# Patient Record
Sex: Female | Born: 1960 | Race: White | Hispanic: No | Marital: Single | Smoking: Current every day smoker
Health system: Southern US, Community
[De-identification: ages and names within clinical notes are randomized; demographics above are authoritative.]

## PROBLEM LIST (undated history)

## (undated) DIAGNOSIS — C4490 Unspecified malignant neoplasm of skin, unspecified: Secondary | ICD-10-CM

## (undated) DIAGNOSIS — N979 Female infertility, unspecified: Secondary | ICD-10-CM

## (undated) DIAGNOSIS — F419 Anxiety disorder, unspecified: Secondary | ICD-10-CM

## (undated) DIAGNOSIS — E282 Polycystic ovarian syndrome: Secondary | ICD-10-CM

## (undated) HISTORY — DX: Anxiety disorder, unspecified: F41.9

## (undated) HISTORY — DX: Female infertility, unspecified: N97.9

## (undated) HISTORY — DX: Polycystic ovarian syndrome: E28.2

## (undated) HISTORY — DX: Unspecified malignant neoplasm of skin, unspecified: C44.90

## (undated) HISTORY — PX: BREAST BIOPSY: SHX20

---

## 2004-02-06 ENCOUNTER — Other Ambulatory Visit: Admission: RE | Admit: 2004-02-06 | Discharge: 2004-02-06 | Payer: Self-pay | Admitting: Obstetrics and Gynecology

## 2004-02-07 ENCOUNTER — Other Ambulatory Visit: Admission: RE | Admit: 2004-02-07 | Discharge: 2004-02-07 | Payer: Self-pay | Admitting: Obstetrics and Gynecology

## 2004-09-13 ENCOUNTER — Inpatient Hospital Stay (HOSPITAL_COMMUNITY): Admission: AD | Admit: 2004-09-13 | Discharge: 2004-09-13 | Payer: Self-pay | Admitting: Obstetrics and Gynecology

## 2004-09-14 ENCOUNTER — Inpatient Hospital Stay (HOSPITAL_COMMUNITY): Admission: AD | Admit: 2004-09-14 | Discharge: 2004-09-16 | Payer: Self-pay | Admitting: Obstetrics and Gynecology

## 2004-09-18 ENCOUNTER — Encounter: Admission: RE | Admit: 2004-09-18 | Discharge: 2004-10-15 | Payer: Self-pay | Admitting: Obstetrics and Gynecology

## 2005-01-26 ENCOUNTER — Encounter: Admission: RE | Admit: 2005-01-26 | Discharge: 2005-01-26 | Payer: Self-pay | Admitting: Obstetrics and Gynecology

## 2006-01-27 ENCOUNTER — Encounter: Admission: RE | Admit: 2006-01-27 | Discharge: 2006-01-27 | Payer: Self-pay | Admitting: Obstetrics and Gynecology

## 2007-01-30 ENCOUNTER — Encounter: Admission: RE | Admit: 2007-01-30 | Discharge: 2007-01-30 | Payer: Self-pay | Admitting: Obstetrics and Gynecology

## 2008-02-19 ENCOUNTER — Encounter: Admission: RE | Admit: 2008-02-19 | Discharge: 2008-02-19 | Payer: Self-pay | Admitting: Obstetrics and Gynecology

## 2009-02-25 ENCOUNTER — Encounter: Admission: RE | Admit: 2009-02-25 | Discharge: 2009-02-25 | Payer: Self-pay | Admitting: Obstetrics and Gynecology

## 2010-02-20 ENCOUNTER — Encounter: Admission: RE | Admit: 2010-02-20 | Discharge: 2010-02-20 | Payer: Self-pay | Admitting: Obstetrics and Gynecology

## 2014-11-15 ENCOUNTER — Other Ambulatory Visit: Payer: Self-pay | Admitting: Physician Assistant

## 2016-04-06 ENCOUNTER — Other Ambulatory Visit: Payer: Self-pay | Admitting: Obstetrics and Gynecology

## 2016-04-06 DIAGNOSIS — R928 Other abnormal and inconclusive findings on diagnostic imaging of breast: Secondary | ICD-10-CM

## 2016-04-09 ENCOUNTER — Ambulatory Visit
Admission: RE | Admit: 2016-04-09 | Discharge: 2016-04-09 | Disposition: A | Discharge status: Home / Self Care | Payer: BLUE CROSS/BLUE SHIELD | Source: Ambulatory Visit | Attending: Obstetrics and Gynecology | Admitting: Obstetrics and Gynecology

## 2016-04-09 DIAGNOSIS — R928 Other abnormal and inconclusive findings on diagnostic imaging of breast: Secondary | ICD-10-CM

## 2018-02-07 DIAGNOSIS — C4491 Basal cell carcinoma of skin, unspecified: Secondary | ICD-10-CM | POA: Insufficient documentation

## 2018-07-01 IMAGING — MG 2D DIGITAL DIAGNOSTIC UNILATERAL RIGHT MAMMOGRAM WITH CAD AND AD
6 series · 6 of 14 positions shown · non-contrast
Comparison: Previous exams including recent screening mammogram
dated 04/05/2016.

CLINICAL DATA: Patient returns today to evaluate a possible right
breast mass identified on recent screening mammogram.

EXAM:
2D DIGITAL DIAGNOSTIC RIGHT MAMMOGRAM WITH CAD AND ADJUNCT TOMO
ULTRASOUND RIGHT BREAST

[R MLO]
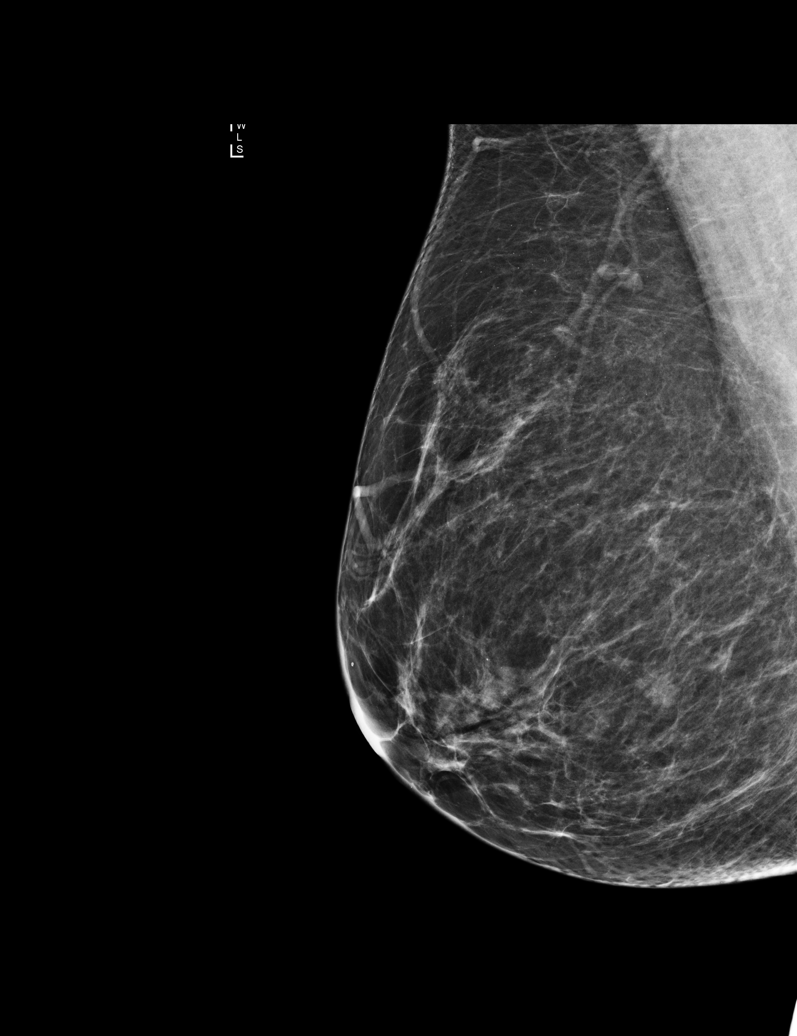

[R MLO synth-2D]
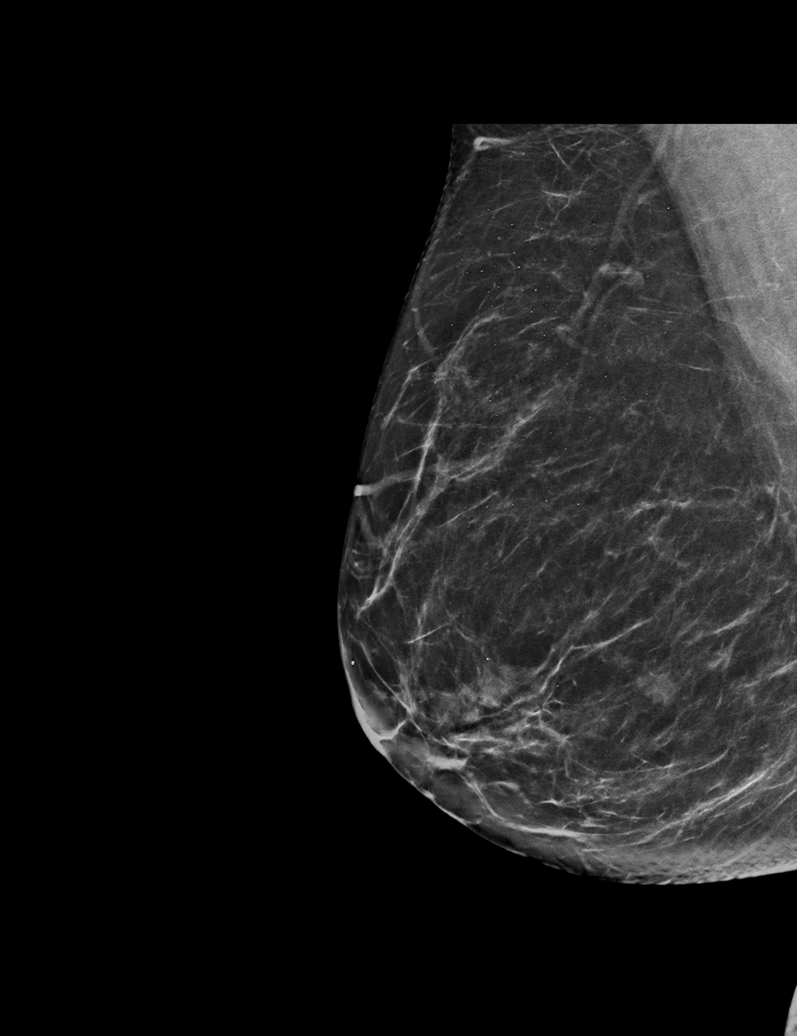

[R CC synth-2D]
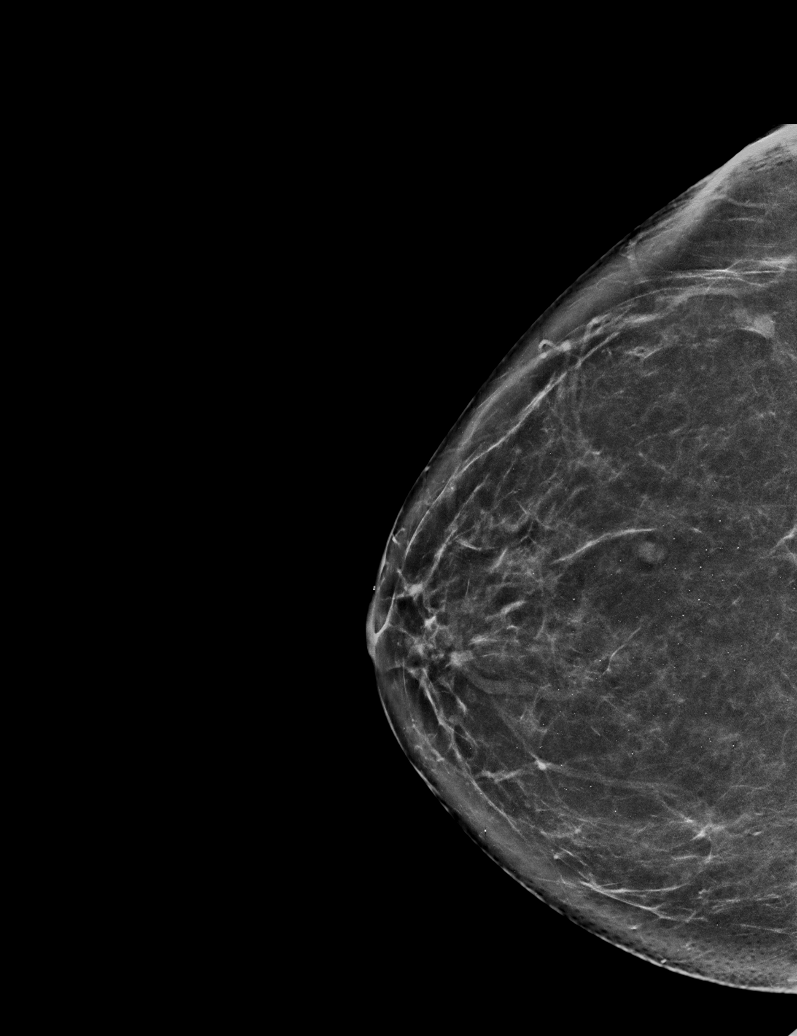

[R CC]
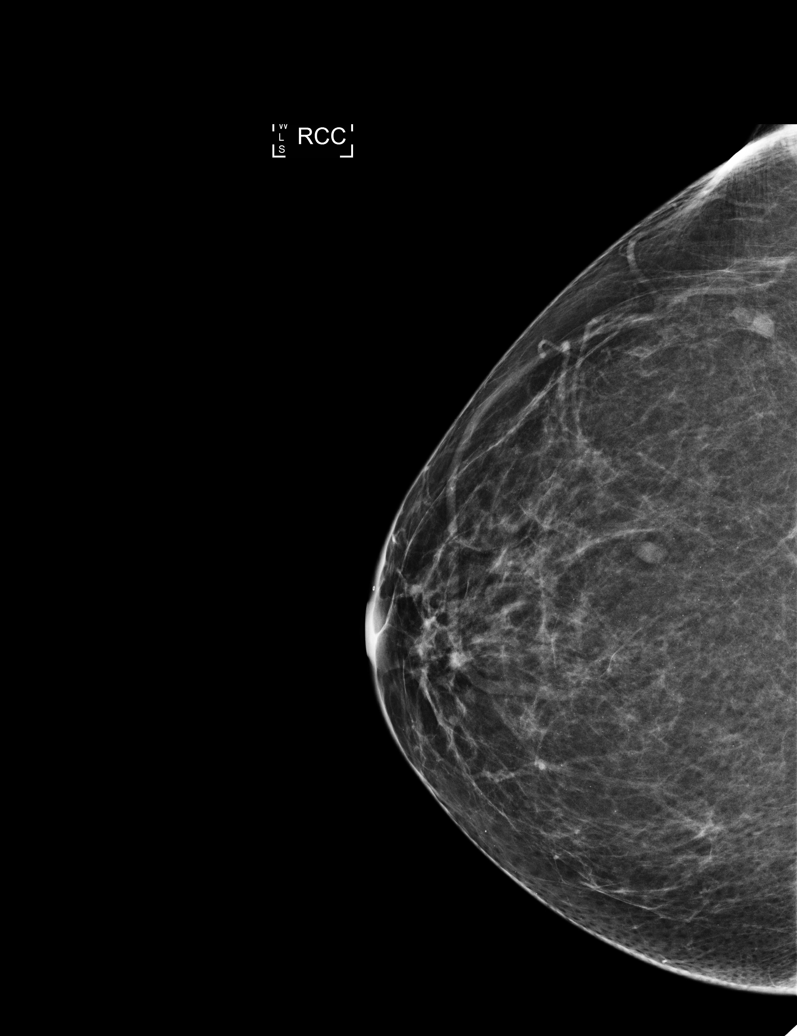

[R MLO tomo · tomo slice 37/72.0]
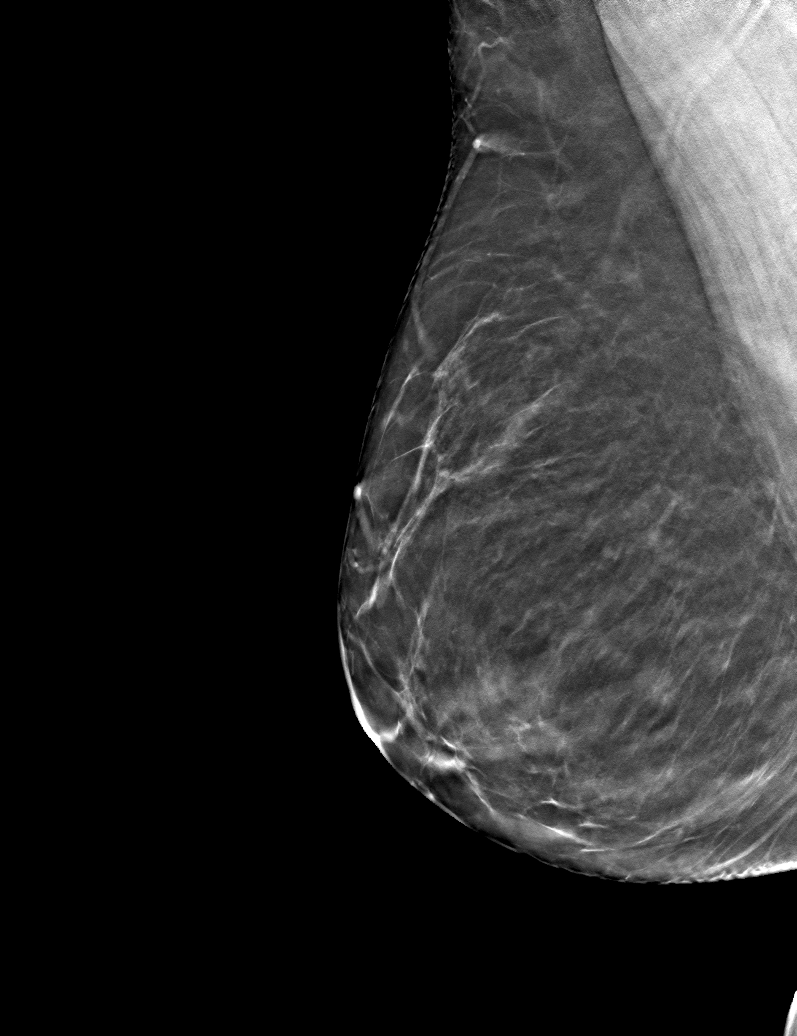

[R CC tomo · tomo slice 37/74.0]
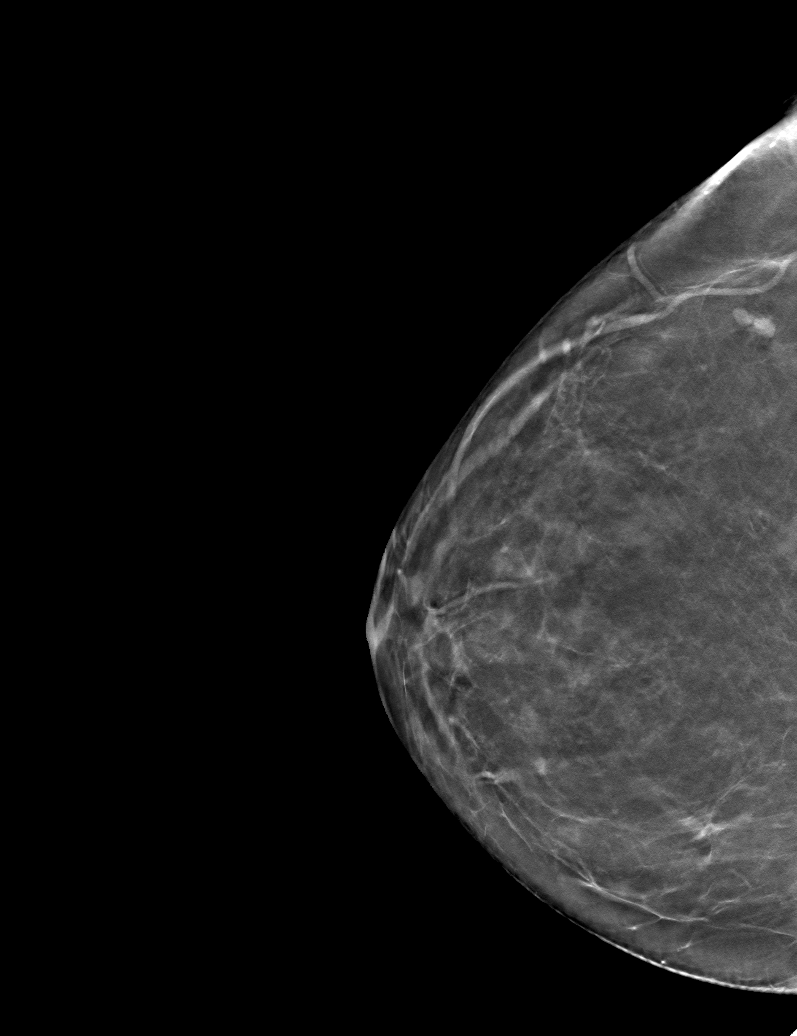

[6 of 14 positions shown; findings below may reference images not displayed]

ACR Breast Density Category b: There are scattered areas of
fibroglandular density.
FINDINGS: On today's additional views with 3D tomosynthesis, an oval
circumscribed mass is confirmed within the lower outer quadrant of
the right breast, measuring approximately 7 mm greatest dimension

Mammographic images were processed with CAD.

Targeted ultrasound is performed, showing a benign cyst within the
right breast at the 7 o'clock axis, 2 cm from the nipple, avascular,
corresponding to the mammographic finding.
IMPRESSION: No evidence of malignancy.  Benign cyst within the right breast.

Patient may return to routine annual bilateral screening mammogram
schedule.

RECOMMENDATION:
Screening mammogram in one year.(Code:[id number])

I have discussed the findings and recommendations with the patient.
Results were also provided in writing at the conclusion of the
visit. If applicable, a reminder letter will be sent to the patient
regarding the next appointment.

BI-RADS CATEGORY  2: Benign.

## 2019-05-11 HISTORY — PX: APPENDECTOMY: SHX54

## 2019-07-10 DIAGNOSIS — Z720 Tobacco use: Secondary | ICD-10-CM | POA: Insufficient documentation

## 2019-07-10 DIAGNOSIS — F17200 Nicotine dependence, unspecified, uncomplicated: Secondary | ICD-10-CM | POA: Insufficient documentation

## 2019-07-13 ENCOUNTER — Other Ambulatory Visit: Payer: Self-pay | Admitting: Obstetrics and Gynecology

## 2019-07-13 DIAGNOSIS — R928 Other abnormal and inconclusive findings on diagnostic imaging of breast: Secondary | ICD-10-CM

## 2019-07-26 ENCOUNTER — Ambulatory Visit
Admission: RE | Admit: 2019-07-26 | Discharge: 2019-07-26 | Disposition: A | Discharge status: Home / Self Care | Payer: Managed Care, Other (non HMO) | Source: Ambulatory Visit | Attending: Obstetrics and Gynecology | Admitting: Obstetrics and Gynecology

## 2019-07-26 ENCOUNTER — Other Ambulatory Visit: Payer: Self-pay | Admitting: Obstetrics and Gynecology

## 2019-07-26 ENCOUNTER — Other Ambulatory Visit: Payer: Self-pay

## 2019-07-26 DIAGNOSIS — R928 Other abnormal and inconclusive findings on diagnostic imaging of breast: Secondary | ICD-10-CM

## 2019-12-11 DIAGNOSIS — K3532 Acute appendicitis with perforation and localized peritonitis, without abscess: Secondary | ICD-10-CM | POA: Insufficient documentation

## 2019-12-11 HISTORY — DX: Acute appendicitis with perforation, localized peritonitis, and gangrene, without abscess: K35.32

## 2020-01-28 ENCOUNTER — Ambulatory Visit
Admission: RE | Admit: 2020-01-28 | Discharge: 2020-01-28 | Disposition: A | Discharge status: Home / Self Care | Payer: No Typology Code available for payment source | Source: Ambulatory Visit | Attending: Obstetrics and Gynecology | Admitting: Obstetrics and Gynecology

## 2020-01-28 ENCOUNTER — Other Ambulatory Visit: Payer: Self-pay

## 2020-01-28 DIAGNOSIS — R928 Other abnormal and inconclusive findings on diagnostic imaging of breast: Secondary | ICD-10-CM

## 2021-08-11 LAB — HM PAP SMEAR

## 2021-08-11 LAB — RESULTS CONSOLE HPV: CHL HPV: NEGATIVE

## 2022-04-20 IMAGING — US US BREAST*R* LIMITED INC AXILLA
1 series · 4 of 4 positions shown · non-contrast
Comparison: Previous exam(s).

CLINICAL DATA: 59-year-old female for six-month follow-up of RIGHT
breast mass.

EXAM:
ULTRASOUND OF THE RIGHT BREAST

[Series 1: us breast*right* limited inc axilla · 0.05mm/px · 4 of 4 slices shown]
[im 1/4]
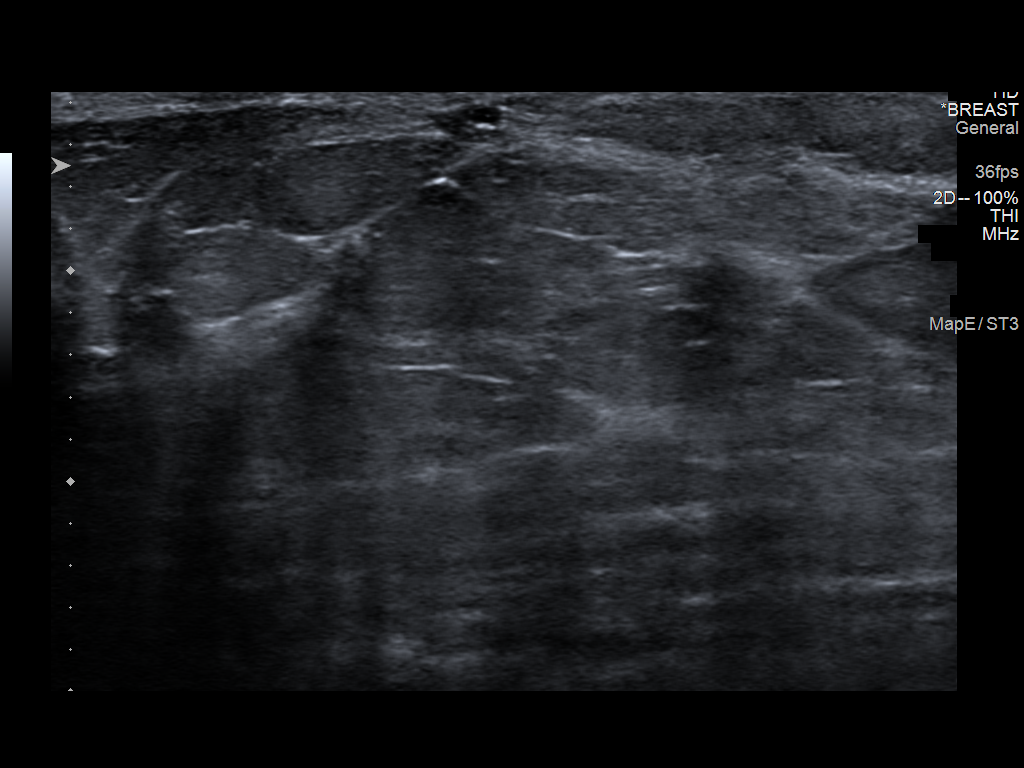
[im 2/4]
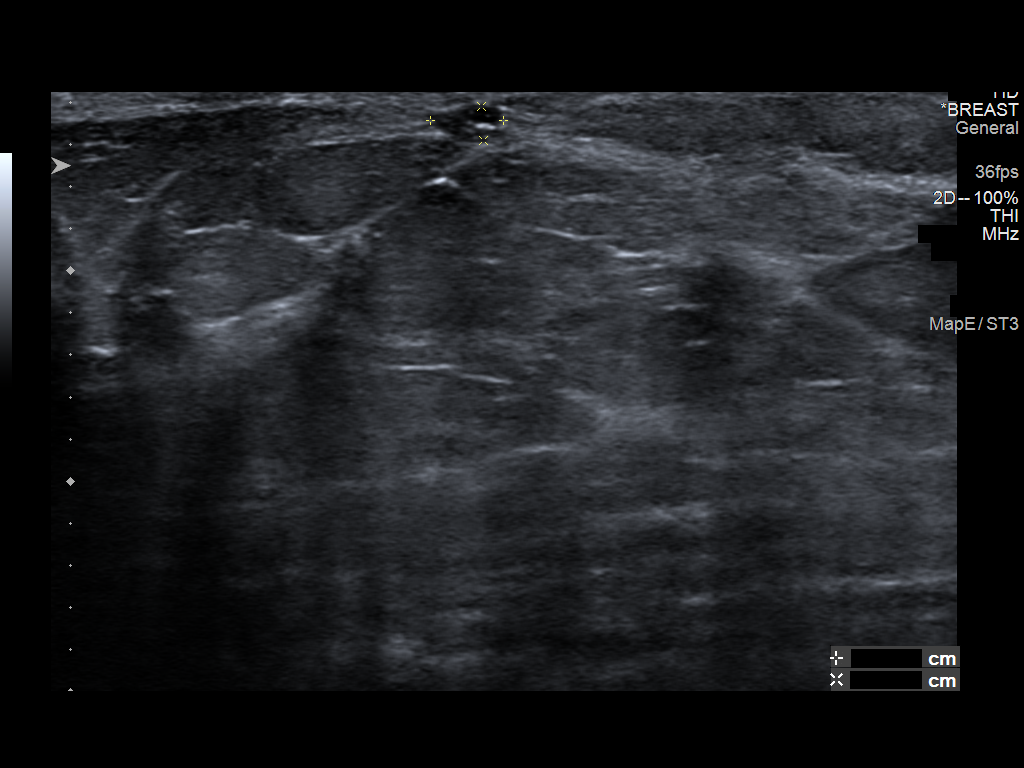
[im 3/4]
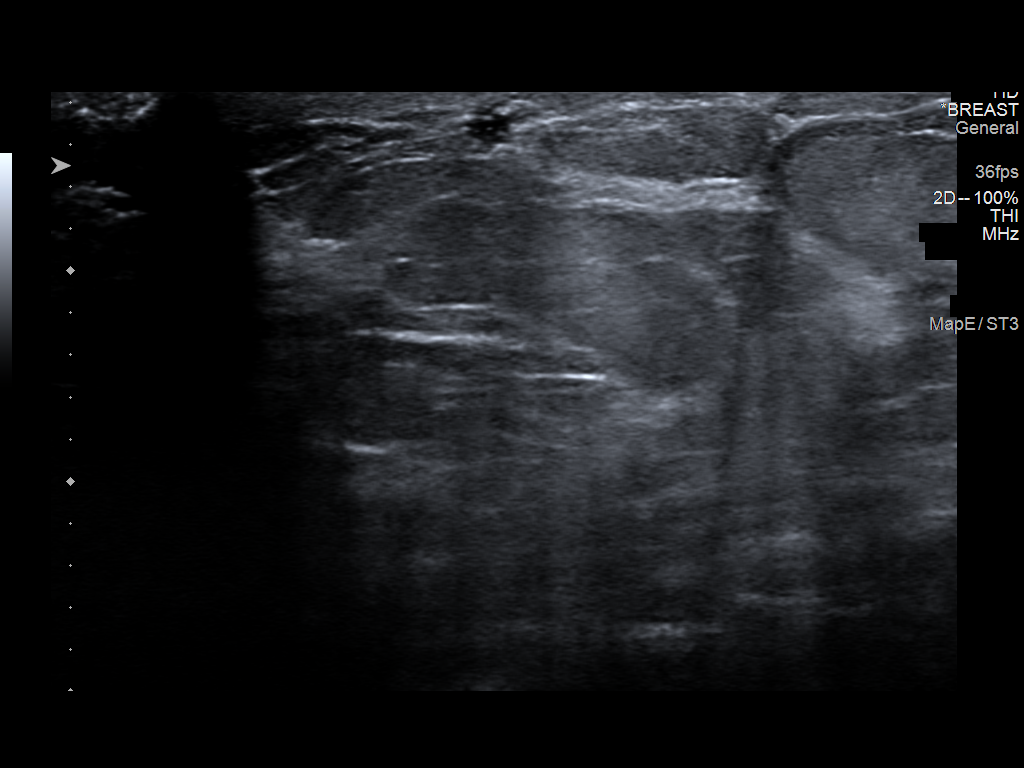
[im 4/4]
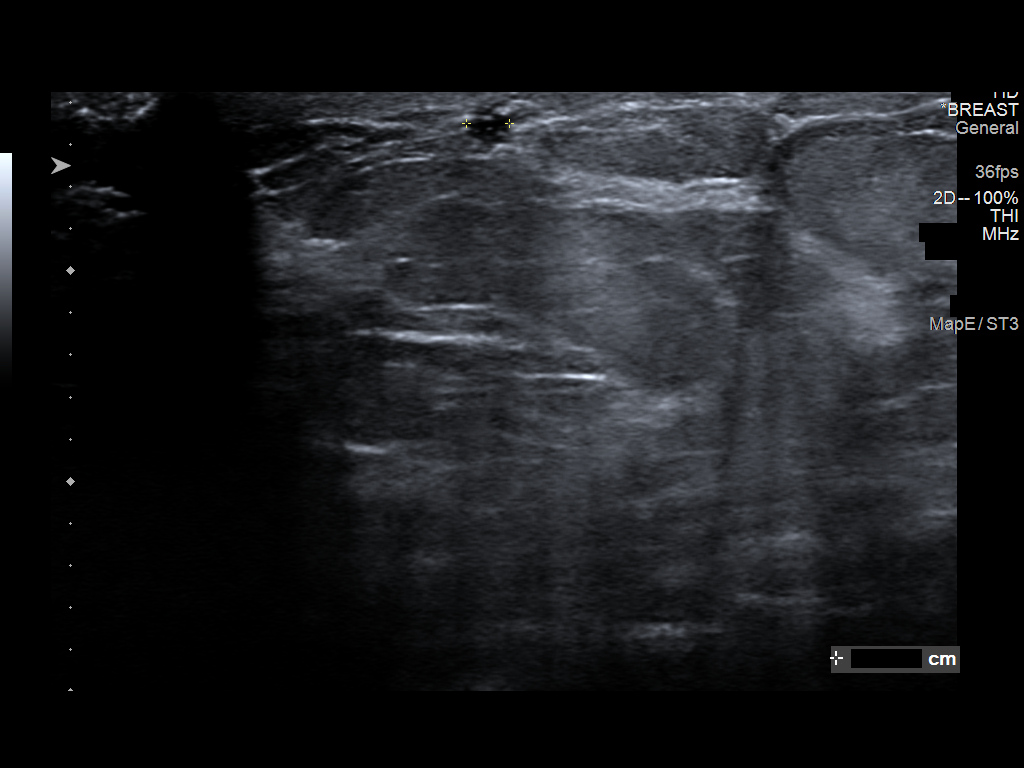

[4 of 4 positions shown; findings below may reference images not displayed]

FINDINGS: Targeted ultrasound is performed, showing a 0.4 x 0.2 x 0.2 cm
cystic mass at the 6 o'clock position of the RIGHT breast 1 cm from
the nipple, previously 0.6 x 0.4 x 0.6 cm. This is compatible with
decrease in size of a benign complicated cyst.
IMPRESSION: Decreased size of benign complicated cyst within the LOWER RIGHT
breast.

RECOMMENDATION:
Bilateral screening mammogram in 6 months to resume annual mammogram
schedule.

I have discussed the findings and recommendations with the patient.
If applicable, a reminder letter will be sent to the patient
regarding the next appointment.

BI-RADS CATEGORY  2: Benign.

## 2022-08-17 LAB — HM MAMMOGRAPHY

## 2022-08-19 ENCOUNTER — Ambulatory Visit (INDEPENDENT_AMBULATORY_CARE_PROVIDER_SITE_OTHER): Payer: No Typology Code available for payment source | Admitting: Nurse Practitioner

## 2022-08-19 ENCOUNTER — Encounter: Payer: Self-pay | Admitting: Nurse Practitioner

## 2022-08-19 VITALS — BP 119/78 | HR 50 | Temp 97.5°F | Ht 67.0 in | Wt 210.0 lb

## 2022-08-19 DIAGNOSIS — U070 Vaping-related disorder: Secondary | ICD-10-CM | POA: Diagnosis not present

## 2022-08-19 DIAGNOSIS — Z6832 Body mass index (BMI) 32.0-32.9, adult: Secondary | ICD-10-CM

## 2022-08-19 DIAGNOSIS — E669 Obesity, unspecified: Secondary | ICD-10-CM | POA: Diagnosis not present

## 2022-08-19 DIAGNOSIS — Z0289 Encounter for other administrative examinations: Secondary | ICD-10-CM

## 2022-08-19 NOTE — Progress Notes (Signed)
Office: [phone]  /  Fax: [phone]   Initial Visit  Andrea Bridges was seen in clinic today to evaluate for obesity. She is interested in losing weight to improve overall health and reduce the risk of weight related complications. She presents today to review program treatment options, initial physical assessment, and evaluation.     She was referred by: Friend or Family  When asked what else they would like to accomplish? She states: Lose a target amount of weight : 160 lbs Her daughter is going to [place] in the fall.    Weight history:  She has struggled with her weight since she was a child.  Her weight has fluctuated over the years.  She has tried multiple weight loss plans in the past.    When asked how has your weight affected you? She states: Has affected self-esteem, Relationships, and Having fatigue  Some associated conditions: PCOS and Other: Basal Cell  Contributing factors: Family history and Pregnancy  Her sister had lapband surgery.   Weight promoting medications identified: None  Current nutrition plan: None  Current level of physical activity: None  Current or previous pharmacotherapy: Phentermine  Response to medication: Lost weight initially but was unable to sustain weight loss   Past medical history includes:  History reviewed. No pertinent past medical history.   Objective:   BP 119/78   Pulse (!) 50   Temp (!) 97.5 F (36.4 C)   Ht 5\' 7"  (1.702 m)   Wt 210 lb (95.3 kg)   LMP  (LMP Unknown)   SpO2 99%   BMI 32.89 kg/m  She was weighed on the bioimpedance scale: Body mass index is 32.89 kg/m.  Peak Weight:210 lbs , Body Fat%:42.6, Visceral Fat Rating:12, Weight trend over the last 12 months: Increasing  General:  Alert, oriented and cooperative. Patient is in no acute distress.  Respiratory: Normal respiratory effort, no problems with respiration noted   Gait: able to ambulate independently  Mental Status: Normal mood and affect. Normal  behavior. Normal judgment and thought content.   DIAGNOSTIC DATA REVIEWED:  BMET No results found for: "NA", "K", "CL", "CO2", "GLUCOSE", "BUN", "CREATININE", "CALCIUM", "GFRNONAA", "GFRAA" No results found for: "HGBA1C" No results found for: "INSULIN" CBC No results found for: "WBC", "RBC", "HGB", "HCT", "PLT", "MCV", "MCH", "MCHC", "RDW" Iron/TIBC/Ferritin/ %Sat No results found for: "IRON", "TIBC", "FERRITIN", "IRONPCTSAT" Lipid Panel  No results found for: "CHOL", "TRIG", "HDL", "CHOLHDL", "VLDL", "LDLCALC", "LDLDIRECT" Hepatic Function Panel  No results found for: "PROT", "ALBUMIN", "AST", "ALT", "ALKPHOS", "BILITOT", "BILIDIR", "IBILI" No results found for: "TSH"   Assessment and Plan:   Vaping-related disorder Discussed the importance of vaping/smoking cessation.    Generalized obesity  BMI 32.0-32.9,adult        Obesity Treatment / Action Plan:  Patient will work on garnering support from family and friends to begin weight loss journey. Will work on eliminating or reducing the presence of highly palatable, calorie dense foods in the home. Will complete provided nutritional and psychosocial assessment questionnaire before the next appointment. Will be scheduled for indirect calorimetry to determine resting energy expenditure in a fasting state.  This will allow [place] to create a reduced calorie, high-protein meal plan to promote loss of fat mass while preserving muscle mass.  Obesity Education Performed Today:  She was weighed on the bioimpedance scale and results were discussed and documented in the synopsis.  We discussed obesity as a disease and the importance of a more detailed evaluation of all  the factors contributing to the disease.  We discussed the importance of long term lifestyle changes which include nutrition, exercise and behavioral modifications as well as the importance of customizing this to her specific health and social needs.  We discussed the  benefits of reaching a healthier weight to alleviate the symptoms of existing conditions and reduce the risks of the biomechanical, metabolic and psychological effects of obesity.  Valory Tetreault appears to be in the action stage of change and states they are ready to start intensive lifestyle modifications and behavioral modifications.  30 minutes was spent today on this visit including the above counseling, pre-visit chart review, and post-visit documentation.  Reviewed by clinician on day of visit: allergies, medications, problem list, medical history, surgical history, family history, social history, and previous encounter notes pertinent to obesity diagnosis.    Theodis Sato Acelin Ferdig FNP-C

## 2022-09-08 ENCOUNTER — Ambulatory Visit (INDEPENDENT_AMBULATORY_CARE_PROVIDER_SITE_OTHER): Payer: No Typology Code available for payment source | Admitting: Family Medicine

## 2022-09-08 ENCOUNTER — Encounter (INDEPENDENT_AMBULATORY_CARE_PROVIDER_SITE_OTHER): Payer: Self-pay | Admitting: Family Medicine

## 2022-09-08 VITALS — BP 104/74 | HR 56 | Temp 98.0°F | Ht 67.0 in | Wt 210.0 lb

## 2022-09-08 DIAGNOSIS — Z9189 Other specified personal risk factors, not elsewhere classified: Secondary | ICD-10-CM

## 2022-09-08 DIAGNOSIS — Z6832 Body mass index (BMI) 32.0-32.9, adult: Secondary | ICD-10-CM | POA: Insufficient documentation

## 2022-09-08 DIAGNOSIS — Z720 Tobacco use: Secondary | ICD-10-CM

## 2022-09-08 DIAGNOSIS — R0602 Shortness of breath: Secondary | ICD-10-CM | POA: Diagnosis not present

## 2022-09-08 DIAGNOSIS — E559 Vitamin D deficiency, unspecified: Secondary | ICD-10-CM

## 2022-09-08 DIAGNOSIS — F17210 Nicotine dependence, cigarettes, uncomplicated: Secondary | ICD-10-CM | POA: Insufficient documentation

## 2022-09-08 DIAGNOSIS — E669 Obesity, unspecified: Secondary | ICD-10-CM | POA: Insufficient documentation

## 2022-09-08 DIAGNOSIS — E538 Deficiency of other specified B group vitamins: Secondary | ICD-10-CM | POA: Diagnosis not present

## 2022-09-08 DIAGNOSIS — F418 Other specified anxiety disorders: Secondary | ICD-10-CM | POA: Insufficient documentation

## 2022-09-08 DIAGNOSIS — R5383 Other fatigue: Secondary | ICD-10-CM | POA: Diagnosis not present

## 2022-09-08 DIAGNOSIS — Z1331 Encounter for screening for depression: Secondary | ICD-10-CM | POA: Insufficient documentation

## 2022-09-08 DIAGNOSIS — F17290 Nicotine dependence, other tobacco product, uncomplicated: Secondary | ICD-10-CM

## 2022-09-08 HISTORY — DX: Shortness of breath: R06.02

## 2022-09-08 NOTE — Progress Notes (Signed)
[name], D.O.  ABFM, ABOM Specializing in Clinical Bariatric Medicine Office located at: [address] [place], [place]  [address]     Initial Bariatric Medicine Consultation Visit  Dear No ref. provider found,   Thank you for referring Andrea Bridges to our clinic today for evaluation.  We performed a consultation to discuss her options for treatment and educate the patient on her disease state.  The following note includes my evaluation and treatment recommendations.   Please do not hesitate to reach out to me directly if you have any further concerns.    Assessment and Plan:   Orders Placed This Encounter  Procedures   CBC with Differential/Platelet   Comprehensive metabolic panel   Hemoglobin A1c   Insulin, random   Lipid panel   T3   T4, free   TSH   Folate   VITAMIN D 25 Hydroxy (Vit-D Deficiency, Fractures)   Vitamin B12   EKG 12-Lead    1) Fatigue Assessment: Condition is Not optimized. Andrea Bridges does feel that her weight is causing her energy to be lower than it should be. Fatigue may be related to obesity, depression or many other causes. she does not appear to have any red flag symptoms and this appears to most likely related to her current lifestyle habits and dietary intake.  Plan:  Labs will be ordered and reviewed with her at their next office visit in two weeks. Epworth sleepiness scale score appears to be within normal limits.  Her ESS score is 3.   Dystany denies daytime somnolence and admits to waking up still tired. Andrea Bridges generally gets 6 hours of sleep per night, and states that she has generally restful sleep. Snoring is "probably present". Apneic episodes is not present.  ECG: Sinus bradycardia, rate 58 bpm; reassuring without any acute abnormalities, will continue to monitor for symptoms  Modified PHQ-9 Depression Screen: Her Food and Mood (modified PHQ-9) score was 8 In the meanwhile, Andrea Bridges will focus on self care including making healthy  food choices by following their meal plan, improving sleep quality and focusing on stress reduction.  Once we are assured she is on an appropriate meal plan, we will start discussing exercise to increase cardiovascular fitness levels.    2) Shortness of breath on exertion Assessment: Condition is Not optimized. Tommia does feel that she gets out of breath more easily than she used to when she exercises and seems to be worsening over time with weight gain.  This has gotten worse recently. Andrea Bridges denies shortness of breath at rest or orthopnea. Andrea Bridges's shortness of breath appears to be obesity related and exercise induced, as they do not appear to have any "red flag" symptoms/ concerns today.  Also, this condition appears to be related to a state of poor cardiovascular conditioning   Plan:  Indirect Calorimeter completed today to help guide our dietary regimen. It shows a VO2 of 206 and a REE of 1426.  Her calculated basal metabolic rate is 4742 thus her measured basal metabolic rate is worse than expected. Patient agreed to work on weight loss at this time.  As Mariahlynn progresses through our weight loss program, we will gradually increase exercise as tolerated to treat her current condition.   If Andrea Bridges follows our recommendations and loses 5-10% of their weight without improvement of her shortness of breath or if at any time, symptoms become more concerning, they agree to urgently follow up with their PCP/ specialist for further consideration/ evaluation.  Andrea Bridges verbalizes agreement with this plan.    Vitamin B12 deficiency Assessment: No results found on file for Vitamin B12 levels.  - She endorses that she "occasionally" takes Vitamin B12 500 mcg for low B12. Denies any side effects.   Plan: - Check labs today - Continue with OTC med.  -Start her prudent nutritional plan that is low in simple carbohydrates, saturated fats and trans fats to goal of 5-10% weight loss to achieve significant health  benefits.     Vitamin D deficiency Assessment:  - She "occasionally" takes OTC Vitamin D3 3000 IU due to low levels in the past. Denies any side effect.  Plan: - Check labs.  - Continue with OTC med.  - I discussed the importance of vitamin D to the patient's health and well-being as well as to their ability to lose weight.  - weight loss will likely improve availability of vitamin D, thus encouraged Andrea Bridges to begin with meal plan and their weight loss efforts to further improve this condition.     Other specified anxiety disorders-Emotional Eating Assessment: Condition is stable. -Denies any SI/HI. Mood is stable. - Patient endorses that she has been on Zoloft "on and off" for the past 10 yrs. Currently, she she is taking Zoloft 50 mg daily. Denies any side effects. - She states that her Anxiety symptoms are well controlled.  - She reports that she eats when stressed, sad, and bored.  - She used to have a therapist in [facility] for depression, however she no longer sees them because she is feeling much better now.   Plan:  - Continue with med as recommended by specialist.  - I informed patient about  Dr. Dewaine Conger, our Bariatric Psychologist, for evaluation due to her struggles with emotional eating and I recommend she start seeing her for E.E. counseling. She declined to meet with Dr.Barker for now, but may consider it in the future.    Tobacco abuse- At risk for Heart Disease  Assessment: Condition is stable.  - Patient endorses that she  quit smoking tobacco "cold [place]" in 2003 for 10 yrs.  - In 2013, she started vaping and she has been "on and off" with vaping.   Plan: - Check labs.  - Counseling of approximately 9+ minutes was done on the importance of tobacco cessation and how tobacco usage likely increases the risk of heart disease,  lung cancer, and emphysema. - Due to Rossanna Herdon current state of health and medical condition(s), she is at a higher risk for heart  disease.  This puts the patient at much greater risk to subsequently develop cardiopulmonary conditions that can significantly affect patient's quality of life in a negative manner as well.   - Evidence-based interventions for health behavior change were utilized today including the discussion of self monitoring techniques, problem-solving barriers, and SMART goal setting techniques.  Specifically, regarding patient's less desirable eating habits and patterns, we employed the technique of small changes when Santanna Zechman has not been able to fully commit to her prudent nutritional plan. - Will continue to monitor condition as it relates to her weight loss journey.   TREATMENT PLAN FOR OBESITY: Generalized obesity BMI 32.0-32.9,adult Assessment: Condition is not optimized. Fat mass has decreased by 0.2 lb. Muscle mass has increased by 0.2 lb. Total body water has increased by 2 lb.   Plan:  Andrea Bridges is currently in the action stage of change. As such, her goal is to continue weight management plan. Andrea Bridges will work  on healthier eating habits and begin the Category 1 meal plan.  - I extensively reviewed the Category 1 meal plan with the patient and all of her associated questions were addressed and answered.   Behavioral Intervention Additional resources provided today: category 1 meal plan information Evidence-based interventions for health behavior change were utilized today including the discussion of self monitoring techniques, problem-solving barriers and SMART goal setting techniques.   Regarding patient's less desirable eating habits and patterns, we employed the technique of small changes.  Pt will specifically work on: beginning Category 1 meal plan for next visit.    Recommended Physical Activity Goals Dashanique has been advised to work up to 150 minutes of moderate intensity aerobic activity a week and strengthening exercises 2-3 times per week for cardiovascular health, weight loss maintenance  and preservation of muscle mass.  She has agreed to continue their current level of activity  FOLLOW UP: Follow up in 2 weeks. She was informed of the importance of frequent follow up visits to maximize her success with intensive lifestyle modifications for her multiple health conditions.  Andrea Bridges is aware that we will review all of her lab results at our next visit.  She is aware that if anything is critical/ life threatening with the results, we will be contacting her via MyChart prior to the office visit to discuss management.   Chief Complaint:   OBESITY Andrea Bridges (MR# [id number]) is a 62 y.o. female who presents for evaluation and treatment of obesity and related comorbidities. Current BMI is Body mass index is 32.89 kg/m. Andrea Bridges has been struggling with her weight for many years and has been unsuccessful in either losing weight, maintaining weight loss, or reaching her healthy weight goal.  Andrea Bridges is currently in the action stage of change and ready to dedicate time achieving and maintaining a healthier weight. Tennille is interested in becoming our patient and working on intensive lifestyle modifications including (but not limited to) diet and exercise for weight loss.  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 Patient is single and has 1 child. She lives with her 43 y/o daughter Andrea Bridges.Andrea Bridges's habits were reviewed today and are as follows:   - She desires to lose 50 lbs in less than 6 months.   - She wants to lose weight to feel better about her appearance and for self-confidence.   - She reports that the main reason she gained weight was eating too many sweets.  - In the past, she  tried Weight Watchers and [employer]. Weight Watchers worked best for her.   - She rarely eats outside of the home.   - She craves chocolate chips, ice-cream, and cookies.   - She snacks on fruits, ice cream, chips, and cookies.  - Her worst food habit is  eating sweet and chocolate.   Andrea Pikes occasionally skips breakfast.    Subjective:   This is the patient's first visit at [facility].  The patient's NEW PATIENT PACKET that they filled out prior to today's office visit was reviewed at length and information from that paperwork was included within the following office visit note.    Included in the packet: current and past health history, medications, allergies, ROS, gynecologic history (women only), surgical history, family history, social history, weight history, weight loss surgery history (for those that have had weight loss surgery), nutritional evaluation, mood and food questionnaire along with a depression screening (  PHQ9) on all patients, an Epworth questionnaire, sleep habits questionnaire, patient life and health improvement goals questionnaire. These will all be scanned into the patient's chart under the "media" tab.   Review of Systems: Please refer to new patient packet scanned into media. Pertinent positives were addressed with patient today.   Objective:   PHYSICAL EXAM: Blood pressure 104/74, pulse (!) 56, temperature 98 F (36.7 C), height 5\' 7"  (1.702 m), weight 210 lb (95.3 kg), SpO2 97 %. Body mass index is 32.89 kg/m. General: Well Developed, well nourished, and in no acute distress.  HEENT: Normocephalic, atraumatic Skin: Warm and dry, cap RF less 2 sec, good turgor Chest:  Normal excursion, shape, no gross abn Respiratory: speaking in full sentences, no conversational dyspnea NeuroM-Sk: Ambulates w/o assistance, moves * 4 Psych: A and O *3, insight good, mood-full  Anthropometric Measurements Height: 5\' 7"  (1.702 m) Weight: 210 lb (95.3 kg) BMI (Calculated): 32.88 Weight at Last Visit: 210lb Weight Lost Since Last Visit: N/A Weight Gained Since Last Visit: N/A Starting Weight: 210lb Waist Measurement : 41.5 inches   Body Composition  Body Fat %: 42.5 % Fat Mass (lbs): 89.6 lbs Muscle  Mass (lbs): 115 lbs Total Body Water (lbs): 84.4 lbs Visceral Fat Rating : 12   Other Clinical Data RMR: [id number] Fasting: yes Labs: yes Today's Visit #: #1 Starting Date: 09/08/22 Comments: FIRST VISIT    DIAGNOSTIC DATA REVIEWED:  BMET No results found for: "NA", "K", "CL", "CO2", "GLUCOSE", "BUN", "CREATININE", "CALCIUM", "GFRNONAA", "GFRAA" No results found for: "HGBA1C" No results found for: "INSULIN" No results found for: "TSH" CBC No results found for: "WBC", "RBC", "HGB", "HCT", "PLT", "MCV", "MCH", "MCHC", "RDW" Iron Studies No results found for: "IRON", "TIBC", "FERRITIN", "IRONPCTSAT" Lipid Panel  No results found for: "CHOL", "TRIG", "HDL", "CHOLHDL", "VLDL", "LDLCALC", "LDLDIRECT" Hepatic Function Panel  No results found for: "PROT", "ALBUMIN", "AST", "ALT", "ALKPHOS", "BILITOT", "BILIDIR", "IBILI" No results found for: "TSH" Nutritional No results found for: "VD25OH"   Attestation Statements:   Reviewed by clinician on day of visit: allergies, medications, problem list, medical history, surgical history, family history, social history, and previous encounter notes.  During the visit, I independently reviewed the patient's EKG, bioimpedance scale results, and indirect calorimeter results. I used this information to tailor a meal plan for the patient that will help Andrea Bridges to lose weight and will improve her obesity-related conditions going forward.  I performed a medically necessary appropriate examination and/or evaluation. I discussed the assessment and treatment plan with the patient. The patient was provided an opportunity to ask questions and all were answered. The patient agreed with the plan and demonstrated an understanding of the instructions. Labs were ordered today (unless patient declined them) and will be reviewed with the patient at our next visit unless more critical results need to be addressed immediately. Clinical information was updated and  documented in the EMR.  Time spent on visit including pre-visit chart review and post-visit care was estimated to be 50  minutes.    I,Special Puri,acting as a Neurosurgeon for [employer], DO.,have documented all relevant documentation on the behalf of Thomasene Lot, DO,as directed by  Thomasene Lot, DO while in the presence of Thomasene Lot, DO.   I, Thomasene Lot, DO, have reviewed all documentation for this visit. The documentation on 09/08/22 for the exam, diagnosis, procedures, and orders are all accurate and complete.

## 2022-09-09 LAB — CBC WITH DIFFERENTIAL/PLATELET
Basophils Absolute: 0 10*3/uL (ref 0.0–0.2)
Basos: 1 %
EOS (ABSOLUTE): 0.1 10*3/uL (ref 0.0–0.4)
Eos: 1 %
Hematocrit: 43.1 % (ref 34.0–46.6)
Hemoglobin: 13.5 g/dL (ref 11.1–15.9)
Immature Grans (Abs): 0 10*3/uL (ref 0.0–0.1)
Immature Granulocytes: 0 %
Lymphocytes Absolute: 1.7 10*3/uL (ref 0.7–3.1)
Lymphs: 30 %
MCH: 26.3 pg — ABNORMAL LOW (ref 26.6–33.0)
MCHC: 31.3 g/dL — ABNORMAL LOW (ref 31.5–35.7)
MCV: 84 fL (ref 79–97)
Monocytes Absolute: 0.3 10*3/uL (ref 0.1–0.9)
Monocytes: 6 %
Neutrophils Absolute: 3.5 10*3/uL (ref 1.4–7.0)
Neutrophils: 62 %
Platelets: 219 10*3/uL (ref 150–450)
RBC: 5.13 x10E6/uL (ref 3.77–5.28)
RDW: 14.7 % (ref 11.7–15.4)
WBC: 5.6 10*3/uL (ref 3.4–10.8)

## 2022-09-09 LAB — VITAMIN D 25 HYDROXY (VIT D DEFICIENCY, FRACTURES): Vit D, 25-Hydroxy: 20.9 ng/mL — ABNORMAL LOW (ref 30.0–100.0)

## 2022-09-09 LAB — LIPID PANEL
Chol/HDL Ratio: 2.6 ratio (ref 0.0–4.4)
Cholesterol, Total: 235 mg/dL — ABNORMAL HIGH (ref 100–199)
HDL: 90 mg/dL (ref >39)
LDL Chol Calc (NIH): 126 mg/dL — ABNORMAL HIGH (ref 0–99)
Triglycerides: 110 mg/dL (ref 0–149)
VLDL Cholesterol Cal: 19 mg/dL (ref 5–40)

## 2022-09-09 LAB — COMPREHENSIVE METABOLIC PANEL
ALT: 16 IU/L (ref 0–32)
AST: 22 IU/L (ref 0–40)
Albumin/Globulin Ratio: 2.1 (ref 1.2–2.2)
Albumin: 4.5 g/dL (ref 3.9–4.9)
Alkaline Phosphatase: 81 IU/L (ref 44–121)
BUN/Creatinine Ratio: 19 (ref 12–28)
BUN: 16 mg/dL (ref 8–27)
Bilirubin Total: 0.4 mg/dL (ref 0.0–1.2)
CO2: 24 mmol/L (ref 20–29)
Calcium: 9.7 mg/dL (ref 8.7–10.3)
Chloride: 100 mmol/L (ref 96–106)
Creatinine, Ser: 0.83 mg/dL (ref 0.57–1.00)
Globulin, Total: 2.1 g/dL (ref 1.5–4.5)
Glucose: 88 mg/dL (ref 70–99)
Potassium: 5 mmol/L (ref 3.5–5.2)
Sodium: 140 mmol/L (ref 134–144)
Total Protein: 6.6 g/dL (ref 6.0–8.5)
eGFR: 80 mL/min/{1.73_m2} (ref >59)

## 2022-09-09 LAB — INSULIN, RANDOM: INSULIN: 9.3 u[IU]/mL (ref 2.6–24.9)

## 2022-09-09 LAB — VITAMIN B12: Vitamin B-12: 501 pg/mL (ref 232–1245)

## 2022-09-09 LAB — T3: T3, Total: 120 ng/dL (ref 71–180)

## 2022-09-09 LAB — T4, FREE: Free T4: 1.22 ng/dL (ref 0.82–1.77)

## 2022-09-09 LAB — FOLATE: Folate: 14.2 ng/mL (ref >3.0)

## 2022-09-09 LAB — TSH: TSH: 1.28 u[IU]/mL (ref 0.450–4.500)

## 2022-09-09 LAB — HEMOGLOBIN A1C
Est. average glucose Bld gHb Est-mCnc: 111 mg/dL
Hgb A1c MFr Bld: 5.5 % (ref 4.8–5.6)

## 2022-09-22 ENCOUNTER — Encounter (INDEPENDENT_AMBULATORY_CARE_PROVIDER_SITE_OTHER): Payer: Self-pay | Admitting: Family Medicine

## 2022-09-22 ENCOUNTER — Ambulatory Visit (INDEPENDENT_AMBULATORY_CARE_PROVIDER_SITE_OTHER): Payer: No Typology Code available for payment source | Admitting: Family Medicine

## 2022-09-22 VITALS — BP 113/72 | HR 62 | Temp 97.8°F | Ht 67.0 in | Wt 207.0 lb

## 2022-09-22 DIAGNOSIS — E7800 Pure hypercholesterolemia, unspecified: Secondary | ICD-10-CM | POA: Diagnosis not present

## 2022-09-22 DIAGNOSIS — F418 Other specified anxiety disorders: Secondary | ICD-10-CM

## 2022-09-22 DIAGNOSIS — E559 Vitamin D deficiency, unspecified: Secondary | ICD-10-CM

## 2022-09-22 DIAGNOSIS — E538 Deficiency of other specified B group vitamins: Secondary | ICD-10-CM

## 2022-09-22 DIAGNOSIS — E669 Obesity, unspecified: Secondary | ICD-10-CM

## 2022-09-22 DIAGNOSIS — E88819 Insulin resistance, unspecified: Secondary | ICD-10-CM

## 2022-09-22 DIAGNOSIS — Z6832 Body mass index (BMI) 32.0-32.9, adult: Secondary | ICD-10-CM

## 2022-09-22 MED ORDER — VITAMIN D (ERGOCALCIFEROL) 1.25 MG (50000 UNIT) PO CAPS: 50000.0000 [IU] | ORAL_CAPSULE | ORAL | 0 refills | Status: DC

## 2022-09-22 NOTE — Progress Notes (Signed)
Andrea Bridges, D.O.  ABFM, ABOM Specializing in Clinical Bariatric Medicine  Office located at: [address] [place], [place]  [address]     Assessment and Plan:   No orders of the defined types were placed in this encounter.   There are no discontinued medications.   Meds ordered this encounter  Medications   Vitamin D, Ergocalciferol, (DRISDOL) 1.25 MG (50000 UNIT) CAPS capsule    Sig: Take 1 capsule (50,000 Units total) by mouth every 7 (seven) days.    Dispense:  4 capsule    Refill:  0      Insulin resistance Assessment: Condition is new and not optimized. Labs were reviewed with pt today and education provided on them and how the foods patient eats may influence these findings. All questions were answered about them.   Lab Results  Component Value Date   HGBA1C 5.5 09/08/2022   INSULIN 9.3 09/08/2022   Lab Results  Component Value Date   WBC 5.6 09/08/2022   HGB 13.5 09/08/2022   HCT 43.1 09/08/2022   MCV 84 09/08/2022   PLT 219 09/08/2022   Lab Results  Component Value Date   CREATININE 0.83 09/08/2022   BUN 16 09/08/2022   NA 140 09/08/2022   K 5.0 09/08/2022   CL 100 09/08/2022   CO2 24 09/08/2022      Component Value Date/Time   PROT 6.6 09/08/2022 0854   ALBUMIN 4.5 09/08/2022 0854   AST 22 09/08/2022 0854   ALT 16 09/08/2022 0854   ALKPHOS 81 09/08/2022 0854   BILITOT 0.4 09/08/2022 0854   Lab Results  Component Value Date   TSH 1.280 09/08/2022   FREET4 1.22 09/08/2022   Lab Results  Component Value Date   FOLATE 14.2 09/08/2022  Labs indicate that: - A1c levels indicate that she is not prediabetic.  - Her insulin levels are almost 2 times above the normal value.  - Blood counts are within normal limits and indicate that she is not anemic.  - Kidney and liver function are normal. However, her sodium levels indicate that she is slightly dehydrated.  - Her thyroid function is normal. - Folate levels are within normal  limits.    Plan:  -  Unless pre-existing renal or cardiopulmonary conditions exist which patient was told to limit their fluid intake by another provider, I recommended roughly one half of their weight in pounds, to be the approximate ounces of non-caloric, non-caffeinated beverages they should drink per day; including more if they are engaging in exercise.  - Stressed importance of dietary and lifestyle modifications to result in weight loss as first line txmnt - Continue to decrease simple carbs/ sugars; increase fiber and proteins -> follow her meal plan.   - Explained role of simple carbs and insulin levels on hunger and cravings - Handouts provided at pt's request after education provided.  All concerns/questions addressed.   [name] will continue to work on weight loss, exercise, via their meal plan we devised to help decrease the risk of progressing to diabetes.  - We will recheck A1c and fasting insulin level in approximately 3 months from last check, or as deemed appropriate.      Vitamin D deficiency Assessment: Condition is not optimized. Labs were reviewed with pt today and education provided on them and how the foods patient eats may influence these findings. All questions were answered about them.   Lab Results  Component Value Date   VD25OH  20.9 (L) 09/08/2022  - Labs indicate that her Vitamin D levels are not within the normal limits of 50-80.  - Endorses that she has not been taking any Vitamin D "for a while".   Plan: - Begin Ergocalciferol 50K IU weekly  - Encouraged pt to get a bone density exam with PCP/GYN.  - I discussed the importance of vitamin D to the patient's health and well-being as well as to their ability to lose weight.  - I reviewed possible symptoms of low Vitamin D:  low energy, depressed mood, muscle aches, joint aches, osteoporosis etc. with patient - It has been show that administration of vitamin D supplementation leads to improved satiety and a  decrease in inflammatory markers.  Hence, low Vitamin D levels may be linked to an increased risk of cardiovascular events and even increased risk of cancers- such as colon and breast. - weight loss will likely improve availability of vitamin D, thus encouraged [name] to continue with meal plan and their weight loss efforts to further improve this condition.  Thus, we will need to monitor levels regularly (every 3-4 mo on average) to keep levels within normal limits and prevent over supplementation. - pt's questions and concerns regarding this condition addressed.      Vitamin B12 deficiency  Assessment: Condition is essentially at goal. Labs were reviewed with pt today and education provided on them and how the foods patient eats may influence these findings. All questions were answered about them.   Lab Results  Component Value Date   VITAMINB12 501 09/08/2022  - Labs indicate that her B12 levels are within the normal limits of 500+.  - Pt states that she is not taking any Vitamin B12 supplements currently.   Plan:  - Continue their prudent nutritional plan and focus on b12 rich foods such as lean red meats; poultry; eggs; seafood; beans, peas, and lentils; nuts and seeds; and soy products - We will continue to monitor as deemed clinically necessary. - Counseling provided today      Other specified anxiety disorders-Emotional Eating Assessment: Condition is stable. - Denies any SI/HI. Mood is stable. - Patient endorses that she has been on Zoloft "on and off" for the past 10 yrs. Currently, she is taking Zoloft 50 mg daily. Denies any side effects. - She states that her anxiety symptoms are well controlled.  - She reports that she eats when stressed, sad, and bored.  - She used to have a therapist in [facility] for depression, however she no longer sees them because her depression has improved.    Plan: - Continue with med as prescribed by specialist.  -  Patient was referred to  Dr. Dewaine Conger, our Bariatric Psychologist, for evaluation due to her elevated PHQ-9 score and significant struggles with emotional eating.  - We will continue to monitor closely alongside PCP / other specialists.     Pure Hypercholesterolemia  Assessment: Condition is new and  not optimized. Labs were reviewed with pt today and education provided on them and how the foods patient eats may influence these findings. All questions were answered about them.   Lab Results  Component Value Date   CHOL 235 (H) 09/08/2022   HDL 90 09/08/2022   LDLCALC 126 (H) 09/08/2022   TRIG 110 09/08/2022   CHOLHDL 2.6 09/08/2022   The 10-year ASCVD risk score (Arnett DK, et al., 2019) is: 4.9%   Values used to calculate the score:     Age: 46 years  Sex: Female     Is Non-Hispanic African American: No     Diabetic: No     Tobacco smoker: Yes     Systolic Blood Pressure: 113 mmHg     Is BP treated: No     HDL Cholesterol: 90 mg/dL     Total Cholesterol: 235 mg/dL   Labs indicate that:  - Her LDL levels are elevated and above the normal limits of 0-99 mg/dl.  - Her Cholesterol levels are elevated and above the normal limits of 100-199 mg/dl.   It is my view that the pt does not need any cholesterol medication currently because her ASCVD risk score is just below 5%.   Plan:  - I stressed the importance that patient continue with our prudent nutritional plan that is low in saturated and trans fats, and low in fatty carbs to improve these numbers.  - We recommend: aerobic activity with eventual goal of a minimum of 150+ min wk plus 2 days/ week of resistance or strength training.   - We will continue routine screening as patient continues to achieve health goals along their weight loss journey    TREATMENT PLAN FOR OBESITY: BMI 32.0-32.9,adult Generalized obesity Assessment:  Ester Tolen is here to discuss her progress with her obesity treatment plan along with follow-up of her obesity related  diagnoses. See Medical Weight Management Flowsheet for complete bioelectrical impedance results.  Condition is improving, but not optimized. Biometric data collected today, was reviewed with patient.    Since last office visit patient's Fat mass has decreased by 19.4 lb. Muscle mass has increased by 15.6 lb. Total body water has increased by 1.4 lb.  Counseling done on how various foods will affect these numbers and how to maximize success  Total lbs lost to date: 3 Total weight loss percentage to date: 1.43   Plan:  - Dasany will work on healthier eating habits and continue the Category 1 meal plan.  - Discussed the importance of Journaling for increased mindfulness. Patient declines to journal at the moment.  - Recommended pt to look into the [employer] and [employer] website for recipe ideas.   Behavioral Intervention Additional resources provided today:  Eating out guide  , Insulin Resistance and Prediabetes Handout  Evidence-based interventions for health behavior change were utilized today including the discussion of self monitoring techniques, problem-solving barriers and SMART goal setting techniques.   Regarding patient's less desirable eating habits and patterns, we employed the technique of small changes.  Pt will specifically work on: buying enough food while grocery shopping and nailing dinner for next visit.    Recommended Physical Activity Goals  Tahina has been advised to slowly work up to 150 minutes of moderate intensity aerobic activity a week and strengthening exercises 2-3 times per week for cardiovascular health, weight loss maintenance and preservation of muscle mass.   She has agreed to Continue current level of physical activity   FOLLOW UP: Return in about 2 weeks (around 10/06/2022). She was informed of the importance of frequent follow up visits to maximize her success with intensive lifestyle modifications for her multiple health conditions.  Subjective:    Chief complaint: Obesity Crosley is here to discuss her progress with her obesity treatment plan. She is on the the Category 1 Plan and states she is following her eating plan approximately 70 % of the time. She states she is not exercising.   Interval History:  Roiza Corriea is here today for her first follow-up  office visit since starting the program with [place].    Since last office visit she  - Endorses that she has been able to follow the meal plan easily during the day time. - Endorses "falling off" at night and on the weekends. Reports eating more than 100 snack calories.  - Reports that it was not hard time for her to get all the protein in.  - The toughest part of the meal plan was following the 100 snack calories.   All blood work/ lab tests that were recently ordered by myself or an outside provider were reviewed with patient today per their request. Extended time was spent counseling her on all new disease processes that were discovered or preexisting ones that are affected by BMI.  she understands that many of these abnormalities will need to monitored regularly along with the current treatment plan of prudent dietary changes, in which we are making each and every office visit, to improve these health parameters.  Review of Systems:  Pertinent positives were addressed with patient today.  Weight Summary and Biometrics   Weight Lost Since Last Visit: 3 lb  Weight Gained Since Last Visit: 0   Vitals Temp: 97.8 F (36.6 C) BP: 113/72 Pulse Rate: 62 SpO2: 99 %   Anthropometric Measurements Height: 5\' 7"  (1.702 m) Weight: 207 lb (93.9 kg) BMI (Calculated): 32.41 Weight at Last Visit: 210 lb Weight Lost Since Last Visit: 3 lb Weight Gained Since Last Visit: 0 Starting Weight: 210 lb Total Weight Loss (lbs): 3 lb (1.361 kg)   Body Composition  Body Fat %: 33.8 % Fat Mass (lbs): 70.2 lbs Muscle Mass (lbs): 130.6 lbs Total Body Water (lbs): 85.8 lbs Visceral Fat Rating  : 10   Other Clinical Data Fasting: No Labs: No Today's Visit #: 2 Starting Date: 09/08/22     Objective:   PHYSICAL EXAM:  Blood pressure 113/72, pulse 62, temperature 97.8 F (36.6 C), height 5\' 7"  (1.702 m), weight 207 lb (93.9 kg), SpO2 99 %. Body mass index is 32.42 kg/m.  General: Well Developed, well nourished, and in no acute distress.  HEENT: Normocephalic, atraumatic Skin: Warm and dry, cap RF less 2 sec, good turgor Chest:  Normal excursion, shape, no gross abn Respiratory: speaking in full sentences, no conversational dyspnea NeuroM-Sk: Ambulates w/o assistance, moves * 4 Psych: A and O *3, insight good, mood-full  DIAGNOSTIC DATA REVIEWED:  BMET    Component Value Date/Time   NA 140 09/08/2022 0854   K 5.0 09/08/2022 0854   CL 100 09/08/2022 0854   CO2 24 09/08/2022 0854   GLUCOSE 88 09/08/2022 0854   BUN 16 09/08/2022 0854   CREATININE 0.83 09/08/2022 0854   CALCIUM 9.7 09/08/2022 0854   Lab Results  Component Value Date   HGBA1C 5.5 09/08/2022   Lab Results  Component Value Date   INSULIN 9.3 09/08/2022   Lab Results  Component Value Date   TSH 1.280 09/08/2022   CBC    Component Value Date/Time   WBC 5.6 09/08/2022 0854   RBC 5.13 09/08/2022 0854   HGB 13.5 09/08/2022 0854   HCT 43.1 09/08/2022 0854   PLT 219 09/08/2022 0854   MCV 84 09/08/2022 0854   MCH 26.3 (L) 09/08/2022 0854   MCHC 31.3 (L) 09/08/2022 0854   RDW 14.7 09/08/2022 0854   Iron Studies No results found for: "IRON", "TIBC", "FERRITIN", "IRONPCTSAT" Lipid Panel     Component Value Date/Time   CHOL 235 (H) 09/08/2022  0854   TRIG 110 09/08/2022 0854   HDL 90 09/08/2022 0854   CHOLHDL 2.6 09/08/2022 0854   LDLCALC 126 (H) 09/08/2022 0854   Hepatic Function Panel     Component Value Date/Time   PROT 6.6 09/08/2022 0854   ALBUMIN 4.5 09/08/2022 0854   AST 22 09/08/2022 0854   ALT 16 09/08/2022 0854   ALKPHOS 81 09/08/2022 0854   BILITOT 0.4 09/08/2022  0854      Component Value Date/Time   TSH 1.280 09/08/2022 0854   Nutritional Lab Results  Component Value Date   VD25OH 20.9 (L) 09/08/2022    Attestations:   Reviewed by clinician on day of visit: allergies, medications, problem list, medical history, surgical history, family history, social history, and previous encounter notes.   Patient was in the office today and time spent on visit including pre-visit chart review and post-visit care/coordination of care and electronic medical record documentation was 50 minutes. 50% of the time was in face to face counseling of this patient's medical condition(s) and providing education on treatment options to include the first-line treatment of diet and lifestyle modification.   I,Special Puri,acting as a Neurosurgeon for [employer], DO.,have documented all relevant documentation on the behalf of Thomasene Lot, DO,as directed by  Thomasene Lot, DO while in the presence of Thomasene Lot, DO.   I, Thomasene Lot, DO, have reviewed all documentation for this visit. The documentation on 09/22/22 for the exam, diagnosis, procedures, and orders are all accurate and complete.

## 2022-09-22 NOTE — Patient Instructions (Signed)
The 10-year ASCVD risk score (Arnett DK, et al., 2019) is: 4.9%   Values used to calculate the score:     Age: 62 years     Sex: Female     Is Non-Hispanic African American: No     Diabetic: No     Tobacco smoker: Yes     Systolic Blood Pressure: 113 mmHg     Is BP treated: No     HDL Cholesterol: 90 mg/dL     Total Cholesterol: 235 mg/dL

## 2022-10-12 ENCOUNTER — Other Ambulatory Visit (INDEPENDENT_AMBULATORY_CARE_PROVIDER_SITE_OTHER): Payer: Self-pay | Admitting: Family Medicine

## 2022-10-14 ENCOUNTER — Ambulatory Visit (INDEPENDENT_AMBULATORY_CARE_PROVIDER_SITE_OTHER): Payer: No Typology Code available for payment source | Admitting: Family Medicine

## 2022-10-14 ENCOUNTER — Encounter (INDEPENDENT_AMBULATORY_CARE_PROVIDER_SITE_OTHER): Payer: Self-pay | Admitting: Family Medicine

## 2022-10-14 VITALS — BP 105/73 | HR 58 | Temp 97.5°F | Ht 67.0 in | Wt 204.0 lb

## 2022-10-14 DIAGNOSIS — E559 Vitamin D deficiency, unspecified: Secondary | ICD-10-CM | POA: Diagnosis not present

## 2022-10-14 DIAGNOSIS — E669 Obesity, unspecified: Secondary | ICD-10-CM

## 2022-10-14 DIAGNOSIS — Z6831 Body mass index (BMI) 31.0-31.9, adult: Secondary | ICD-10-CM | POA: Diagnosis not present

## 2022-10-14 DIAGNOSIS — E88819 Insulin resistance, unspecified: Secondary | ICD-10-CM | POA: Diagnosis not present

## 2022-10-14 DIAGNOSIS — Z6832 Body mass index (BMI) 32.0-32.9, adult: Secondary | ICD-10-CM

## 2022-10-14 MED ORDER — VITAMIN D (ERGOCALCIFEROL) 1.25 MG (50000 UNIT) PO CAPS
50000.0000 [IU] | ORAL_CAPSULE | ORAL | 0 refills | Status: DC
Start: 2022-10-14 — End: 2022-11-17

## 2022-10-14 MED ORDER — METFORMIN HCL 500 MG PO TABS
ORAL_TABLET | ORAL | 0 refills | Status: DC
Start: 2022-10-14 — End: 2022-10-28

## 2022-10-14 NOTE — Progress Notes (Signed)
[name], D.O.  ABFM, ABOM Specializing in Clinical Bariatric Medicine  Office located at: [address] [place], [place]  [address]     Assessment and Plan:   Medications Discontinued During This Encounter  Medication Reason   Vitamin D, Ergocalciferol, (DRISDOL) 1.25 MG (50000 UNIT) CAPS capsule Reorder     Meds ordered this encounter  Medications   Vitamin D, Ergocalciferol, (DRISDOL) 1.25 MG (50000 UNIT) CAPS capsule    Sig: Take 1 capsule (50,000 Units total) by mouth every 7 (seven) days.    Dispense:  4 capsule    Refill:  0   metFORMIN (GLUCOPHAGE) 500 MG tablet    Sig: 1 po with lunch daily    Dispense:  30 tablet    Refill:  0    30 d supply;  ** OV for RF **   Do not send RF request     Insulin resistance Assessment: Condition is not optimized. Lab Results  Component Value Date   HGBA1C 5.5 09/08/2022   INSULIN 9.3 09/08/2022  - Reports having cravings for sweets, especially at night. Sometimes she eats handfuls of chocolate chips.  - Pt has been following meal plan 50% of the time and notes that she is not always measuring her lean proteins.   Plan:  - Start Metformin. Patient has been instructed to start with 1/2 tab for 3-4 days and if tolerating well,she can advance to 1 full tablet. Discussed anticipated benefits and possible side effects of Metformin with pt and Metformin handout was provided. All her questions/concerns were addressed.  - Again reminded patient that having protein with each meal is critical for controlling hunger and cravings.   - Will continue to monitor condition closely.     Vitamin D deficiency Assessment: Condition is not optimized. Lab Results  Component Value Date   VD25OH 20.9 (L) 09/08/2022  - No issues with Ergocalciferol 50K IU weekly. Denies any N/V/D - She has not noticed any changes in her energy levels.   Plan: - Continue with med. Will reorder this today.   -Will continue to monitor levels  regularly (every 3-4 mo on average) to keep levels within normal limits and prevent over supplementation.   TREATMENT PLAN FOR OBESITY: BMI 32.0-32.9,adult Obesity, Beginning BMI 32.89 Assessment:  Niayla Dewan is here to discuss her progress with her obesity treatment plan along with follow-up of her obesity related diagnoses. See Medical Weight Management Flowsheet for complete bioelectrical impedance results.  Condition is not optimized.  Biometric data collected today, was reviewed with patient.   Since last office visit on 09/22/22 patient's  Muscle mass has decreased by 6.2 lb. Fat mass has increased by 3.4 lb. Total body water has decreased by 6.2 lb.  Counseling done on how various foods will affect these numbers and how to maximize success  Total lbs lost to date: 6  Total weight loss percentage to date: 2.86  Plan:  - Continue the Category 1 meal plan.   Behavioral Intervention Additional resources provided today: recipe packet #1 and Metformin Handout , Protein Equivalence Handout  Evidence-based interventions for health behavior change were utilized today including the discussion of self monitoring techniques, problem-solving barriers and SMART goal setting techniques.   Regarding patient's less desirable eating habits and patterns, we employed the technique of small changes.  Pt will specifically work on: starting Metformin, trying meals from recipe packet #1, and increasing protein intake for next visit.    Recommended Physical Activity Goals  Levette has been advised to slowly work up to 150 minutes of moderate intensity aerobic activity a week and strengthening exercises 2-3 times per week for cardiovascular health, weight loss maintenance and preservation of muscle mass.   She has agreed to Continue current level of physical activity   FOLLOW UP: Return in about 2 weeks (around 10/28/2022). She was informed of the importance of frequent follow up visits to maximize her  success with intensive lifestyle modifications for her multiple health conditions.   Subjective:   Chief complaint: Obesity Cresta is here to discuss her progress with her obesity treatment plan. She is on the the Category 1 Plan and states she is following her eating plan approximately 50 % of the time. She states she is walking more throughout the day, apart from this she is doing no formal exercise.   Interval History:  Gearlene Deasy is here for a follow up office visit.     Since last office visit:   - She has been having cravings for carbs. - Sometimes she eats handfuls of chocolate chips.  - Endorses that she needs to work on meal prepping. Her biggest challenge is dinner.  - She has not been weighing her lean proteins consistently.  - Has not noticed any improvement in energy on Vitamin D.  - Has appointment with Dr.Barker on 10/18/22.  Review of Systems:  Pertinent positives were addressed with patient today.  Weight Summary and Biometrics   Weight Lost Since Last Visit: 3lb  Weight Gained Since Last Visit: 0lb    Vitals Temp: (!) 97.5 F (36.4 C) BP: 105/73 Pulse Rate: (!) 58 SpO2: 99 %   Anthropometric Measurements Height: 5\' 7"  (1.702 m) Weight: 204 lb (92.5 kg) BMI (Calculated): 31.94 Weight at Last Visit: 207lb Weight Lost Since Last Visit: 3lb Weight Gained Since Last Visit: 0lb Starting Weight: 210lb Total Weight Loss (lbs): 6 lb (2.722 kg)   Body Composition  Body Fat %: 36 % Fat Mass (lbs): 73.6 lbs Muscle Mass (lbs): 124.4 lbs Total Body Water (lbs): 79.6 lbs Visceral Fat Rating : 10   Other Clinical Data Fasting: No Labs: No Today's Visit #: 3 Starting Date: 09/09/22   Objective:   PHYSICAL EXAM: Blood pressure 105/73, pulse (!) 58, temperature (!) 97.5 F (36.4 C), height 5\' 7"  (1.702 m), weight 204 lb (92.5 kg), SpO2 99 %. Body mass index is 31.95 kg/m.  General: Well Developed, well nourished, and in no acute distress.  HEENT:  Normocephalic, atraumatic Skin: Warm and dry, cap RF less 2 sec, good turgor Chest:  Normal excursion, shape, no gross abn Respiratory: speaking in full sentences, no conversational dyspnea NeuroM-Sk: Ambulates w/o assistance, moves * 4 Psych: A and O *3, insight good, mood-full  DIAGNOSTIC DATA REVIEWED:  BMET    Component Value Date/Time   NA 140 09/08/2022 0854   K 5.0 09/08/2022 0854   CL 100 09/08/2022 0854   CO2 24 09/08/2022 0854   GLUCOSE 88 09/08/2022 0854   BUN 16 09/08/2022 0854   CREATININE 0.83 09/08/2022 0854   CALCIUM 9.7 09/08/2022 0854   Lab Results  Component Value Date   HGBA1C 5.5 09/08/2022   Lab Results  Component Value Date   INSULIN 9.3 09/08/2022   Lab Results  Component Value Date   TSH 1.280 09/08/2022   CBC    Component Value Date/Time   WBC 5.6 09/08/2022 0854   RBC 5.13 09/08/2022 0854   HGB 13.5 09/08/2022 0854   HCT  43.1 09/08/2022 0854   PLT 219 09/08/2022 0854   MCV 84 09/08/2022 0854   MCH 26.3 (L) 09/08/2022 0854   MCHC 31.3 (L) 09/08/2022 0854   RDW 14.7 09/08/2022 0854   Iron Studies No results found for: "IRON", "TIBC", "FERRITIN", "IRONPCTSAT" Lipid Panel     Component Value Date/Time   CHOL 235 (H) 09/08/2022 0854   TRIG 110 09/08/2022 0854   HDL 90 09/08/2022 0854   CHOLHDL 2.6 09/08/2022 0854   LDLCALC 126 (H) 09/08/2022 0854   Hepatic Function Panel     Component Value Date/Time   PROT 6.6 09/08/2022 0854   ALBUMIN 4.5 09/08/2022 0854   AST 22 09/08/2022 0854   ALT 16 09/08/2022 0854   ALKPHOS 81 09/08/2022 0854   BILITOT 0.4 09/08/2022 0854      Component Value Date/Time   TSH 1.280 09/08/2022 0854   Nutritional Lab Results  Component Value Date   VD25OH 20.9 (L) 09/08/2022    Attestations:   Reviewed by clinician on day of visit: allergies, medications, problem list, medical history, surgical history, family history, social history, and previous encounter notes.    I,Special Puri,acting as  a Neurosurgeon for [employer], DO.,have documented all relevant documentation on the behalf of Thomasene Lot, DO,as directed by  Thomasene Lot, DO while in the presence of Thomasene Lot, DO.   I, Thomasene Lot, DO, have reviewed all documentation for this visit. The documentation on 10/14/22 for the exam, diagnosis, procedures, and orders are all accurate and complete.

## 2022-10-18 ENCOUNTER — Telehealth (INDEPENDENT_AMBULATORY_CARE_PROVIDER_SITE_OTHER): Payer: No Typology Code available for payment source | Admitting: Psychology

## 2022-10-18 DIAGNOSIS — F5089 Other specified eating disorder: Secondary | ICD-10-CM | POA: Diagnosis not present

## 2022-10-18 NOTE — Progress Notes (Signed)
Office: [phone]  /  Fax: [phone]    Date: October 18, 2022    Appointment Start Time: 9:05am Duration: 27 minutes Provider: Lawerance Cruel, Psy.D. Type of Session: Intake for Individual Therapy  Location of Patient: Work (private location) Location of Provider: Provider's home (private office) Type of Contact: Telepsychological Visit via MyChart Video Visit  Informed Consent: This provider called Andrea Bridges at 9:03am as she did not present for today's appointment. She acknowledged she lost track of time but would join. As such, today's appointment was initiated 5 minutes late. Prior to proceeding with today's appointment, two pieces of identifying information were obtained. In addition, Andrea Bridges's physical location at the time of this appointment was obtained as well a phone number she could be reached at in the event of technical difficulties. Andrea Bridges and this provider participated in today's telepsychological service.   The provider's role was explained to Andrea Bridges. The provider reviewed and discussed issues of confidentiality, privacy, and limits therein (e.g., reporting obligations). In addition to verbal informed consent, written informed consent for psychological services was obtained prior to the initial appointment. Since the clinic is not a 24/7 crisis center, mental health emergency resources were shared and this  provider explained MyChart, e-mail, voicemail, and/or other messaging systems should be utilized only for non-emergency reasons. This provider also explained that information obtained during appointments will be placed in Andrea Bridges's medical record and relevant information will be shared with other providers at [facility] for coordination of care. Andrea Bridges agreed information may be shared with other [facility] providers as needed for coordination of care and by signing the service agreement document, she provided written consent for coordination of care.  Prior to initiating telepsychological services, Andrea Bridges completed an informed consent document, which included the development of a safety plan (i.e., an emergency contact and emergency resources) in the event of an emergency/crisis. Andrea Bridges verbally acknowledged understanding she is ultimately responsible for understanding her insurance benefits for telepsychological and in-person services. This provider also reviewed confidentiality, as it relates to telepsychological services. Andrea Bridges  acknowledged understanding that appointments cannot be recorded without both party consent and she is aware she is responsible for securing confidentiality on her end of the session. Andrea Bridges verbally consented to proceed.  Chief Complaint/HPI: Andrea Bridges was referred by Dr. Thomasene Lot due to  "Other specified anxiety disorders-Emotional Eating" . Per the note for the visit with Dr. Thomasene Lot on 09/22/2022, "She reports that she eats when stressed, sad, and bored."   During today's appointment, Andrea Bridges was verbally administered a questionnaire assessing various behaviors related to emotional eating behaviors. Andrea Bridges endorsed the following: overeat when you are celebrating, eat certain foods when you are anxious, stressed, depressed, or your feelings are hurt, use food to help you cope with emotional situations, find food is comforting to you, overeat when you are angry or upset, overeat when you are worried about something, overeat frequently when you are bored or lonely, not worry about what you eat when you are in a good mood, overeat when you are alone, but eat much less when you are with other people, and eat as a reward. Elyce believes the onset of emotional eating behaviors was likely in childhood and described the current frequency of emotional eating behaviors as "few times a week." In addition, Andrea Bridges denied a history of binge eating behaviors. Andrea Bridges denied a history of significantly restricting food intake, purging and  engagement in other compensatory strategies for weight loss, and has never been  diagnosed with an eating disorder. She also denied a history of treatment for emotional eating behaviors. Currently, Andrea Bridges indicated she follows her structured meal plan during the day, but described challenges after dinner. Furthermore, Andrea Bridges denied other problems of concern.    Mental Status Examination:  Appearance: neat Behavior: appropriate to circumstances Mood: neutral Affect: mood congruent Speech: WNL Eye Contact: appropriate Psychomotor Activity: WNL Gait: unable to assess  Thought Process: linear, logical, and goal directed and denies suicidal, homicidal, and self-harm ideation, plan and intent  Thought Content/Perception: no hallucinations, delusions, bizarre thinking or behavior endorsed or observed Orientation: AAOx4 Memory/Concentration: intact Insight/Judgment: fair  Family & Psychosocial History: Andrea Bridges reported she is not in a relationship and she has a daughter (age 79), noting she divorced when her daughter was 66 years old. 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 Additionally, Andrea Bridges shared her highest level of education obtained is "one year of college." Currently, Andrea Bridges's social support system consists of her daughter, friends, and church friends. Moreover, Andrea Bridges stated she resides with her daughter.   Medical History:  Past Medical History:  Diagnosis Date   Anxiety    Infertility, female    PCOS (polycystic ovarian syndrome)    PCOS (polycystic ovarian syndrome)    Skin cancer    basal cell   No past surgical history on file. Current Outpatient Medications on File Prior to Visit  Medication Sig Dispense Refill   Bacillus Coagulans-Inulin (PROBIOTIC) 1-250 BILLION-MG CAPS Take by mouth.     Biotin (BIOTIN 5000) 5 MG CAPS Take by mouth.     Cholecalciferol (VITAMIN D3) 75 MCG (3000 UT) TABS Take by mouth.     cyanocobalamin (VITAMIN B12) 500 MCG tablet Take  500 mcg by mouth daily.     magnesium oxide (MAG-OX) 400 (240 Mg) MG tablet Take 400 mg by mouth daily.     metFORMIN (GLUCOPHAGE) 500 MG tablet 1 po with lunch daily 30 tablet 0   sertraline (ZOLOFT) 50 MG tablet Take 50 mg by mouth daily.     Vitamin D, Ergocalciferol, (DRISDOL) 1.25 MG (50000 UNIT) CAPS capsule Take 1 capsule (50,000 Units total) by mouth every 7 (seven) days. 4 capsule 0   zinc gluconate 50 MG tablet Take 50 mg by mouth daily.     No current facility-administered medications on file prior to visit.  Andrea Bridges stated she is medication compliant.   Mental Health History: Andrea Bridges reported a history of therapeutic services, noting she "stopped approximately 6 months ago." She further reported her PCP currently prescribes Zoloft. Andrea Bridges reported there is no history of hospitalizations for psychiatric concerns. Andrea Bridges denied a family history of mental health/substance abuse related concerns. Andrea Bridges reported there is no history of trauma including psychological, physical , and sexual abuse, as well as neglect.   Andrea Bridges described her typical mood lately as "really, really busy," adding "it has been pretty good." She disclosed a history of panic attacks during the divorce process. Sammie endorsed "very seldom" alcohol use. She reported vaping nicotine daily. She denied illicit/recreational substance use. Furthermore, Pamula indicated she is not experiencing the following: hallucinations and delusions, paranoia, symptoms of mania , social withdrawal, crying spells, memory concerns, attention and concentration issues, and obsessions and compulsions. She also denied history of and current suicidal ideation, plan, and intent; history of and current homicidal ideation, plan, and intent; and history of and current engagement in self-harm.  Legal History: Jakela reported there is no history of legal involvement.   Structured  Assessments Results: The Patient Health Questionnaire-9 (PHQ-9) is a self-report  measure that assesses symptoms and severity of depression over the course of the last two weeks. Joyelle obtained a score of 1 suggesting minimal depression. Karlei finds the endorsed symptoms to be not difficult at all. [0= Not at all; 1= Several days; 2= More than half the days; 3= Nearly every day] Little interest or pleasure in doing things 0  Feeling down, depressed, or hopeless 0  Trouble falling or staying asleep, or sleeping too much 0  Feeling tired or having little energy 0  Poor appetite or overeating 1  Feeling bad about yourself --- or that you are a failure or have let yourself or your family down 0  Trouble concentrating on things, such as reading the newspaper or watching television 0  Moving or speaking so slowly that other people could have noticed? Or the opposite --- being so fidgety or restless that you have been moving around a lot more than usual 0  Thoughts that you would be better off dead or hurting yourself in some way 0  PHQ-9 Score 1    The Generalized Anxiety Disorder-7 (GAD-7) is a brief self-report measure that assesses symptoms of anxiety over the course of the last two weeks. Lakyah obtained a score of 2 suggesting minimal anxiety. Jrue finds the endorsed symptoms to be not difficult at all. [0= Not at all; 1= Several days; 2= Over half the days; 3= Nearly every day] Feeling nervous, anxious, on edge-  due to daughter graduating and moving for college this fall 2  Not being able to stop or control worrying 0  Worrying too much about different things 0  Trouble relaxing 0  Being so restless that it's hard to sit still 0  Becoming easily annoyed or irritable 0  Feeling afraid as if something awful might happen 0  GAD-7 Score 2   Interventions:  Conducted a chart review Focused on rapport building Verbally administered PHQ-9 and GAD-7 for symptom monitoring Verbally administered Food & Mood questionnaire to assess various behaviors related to emotional  eating Provided emphatic reflections and validation Collaborated with patient on a treatment goal  Psychoeducation provided regarding physical versus emotional hunger  Diagnostic Impressions & Provisional DSM-5 Diagnosis(es): Maryfer disclosed a history of engagement in emotional eating behaviors starting in childhood and described the current frequency as "few times a week." She denied engagement in any other disordered eating behaviors. Based on the aforementioned, the following diagnosis was assigned: F50.89 Other Specified Feeding or Eating Disorder, Emotional Eating Behaviors.  Plan: Ardene appears able and willing to participate as evidenced by collaboration on a treatment goal, engagement in reciprocal conversation, and asking questions as needed for clarification. The next appointment is scheduled for 11/08/2022 at 4pm, which will be via MyChart Video Visit. The following treatment goal was established: increase coping skills. This provider will regularly review the treatment plan and medical chart to keep informed of status changes. Airam expressed understanding and agreement with the initial treatment plan of care. Irasema will be sent a handout via e-mail to utilize between now and the next appointment to increase awareness of hunger patterns and subsequent eating. Jani provided verbal consent during today's appointment for this provider to send the handout via e-mail.

## 2022-10-27 ENCOUNTER — Encounter (INDEPENDENT_AMBULATORY_CARE_PROVIDER_SITE_OTHER): Payer: Self-pay | Admitting: Family Medicine

## 2022-10-28 ENCOUNTER — Encounter (INDEPENDENT_AMBULATORY_CARE_PROVIDER_SITE_OTHER): Payer: Self-pay | Admitting: Family Medicine

## 2022-10-28 ENCOUNTER — Ambulatory Visit (INDEPENDENT_AMBULATORY_CARE_PROVIDER_SITE_OTHER): Payer: No Typology Code available for payment source | Admitting: Family Medicine

## 2022-10-28 VITALS — BP 113/72 | HR 60 | Temp 97.8°F | Ht 67.0 in | Wt 209.0 lb

## 2022-10-28 DIAGNOSIS — E559 Vitamin D deficiency, unspecified: Secondary | ICD-10-CM | POA: Diagnosis not present

## 2022-10-28 DIAGNOSIS — E88819 Insulin resistance, unspecified: Secondary | ICD-10-CM

## 2022-10-28 DIAGNOSIS — E669 Obesity, unspecified: Secondary | ICD-10-CM

## 2022-10-28 DIAGNOSIS — Z6832 Body mass index (BMI) 32.0-32.9, adult: Secondary | ICD-10-CM

## 2022-10-28 MED ORDER — METFORMIN HCL 500 MG PO TABS: ORAL_TABLET | ORAL | 0 refills | Status: DC

## 2022-10-28 NOTE — Progress Notes (Signed)
[name], D.O.  ABFM, ABOM Specializing in Clinical Bariatric Medicine  Office located at: [address] [place], [place]  [address]     Assessment and Plan:   Medications Discontinued During This Encounter  Medication Reason   metFORMIN (GLUCOPHAGE) 500 MG tablet Reorder     Meds ordered this encounter  Medications   metFORMIN (GLUCOPHAGE) 500 MG tablet    Sig: 1 po with lunch and dinner daily    Dispense:  60 tablet    Refill:  0    30 d supply;  ** OV for RF **   Do not send RF request     Insulin resistance Assessment: Condition is not optimized. Andrea Bridges has been taking Metformin 500 mg. She endorses forgetting to take the medication "a couple of days". Denies any GI upset. Has not noticed any significant difference in her cravings.  Lab Results  Component Value Date   HGBA1C 5.5 09/08/2022   INSULIN 9.3 09/08/2022    Plan: Increase her Metformin to 500 mg BID.   Andrea Bridges will continue to work on weight loss, exercise, via their meal plan we devised to help decrease the risk of progressing to prediabetes.  We will recheck A1c and fasting insulin level in approximately 3 months from last check, or as deemed appropriate.     Vitamin D deficiency Assessment: Condition is being treated with Ergocalciferol 50K IU weekly and she is tolerating supplement well. Denies any adverse effects .  Lab Results  Component Value Date   VD25OH 20.9 (L) 09/08/2022   Plan: Continue with prescribed supplement at current dose. Pt denies need for refill.   Weight loss will likely improve availability of vitamin D, thus encouraged Andrea Bridges to continue with meal plan and their weight loss efforts to further improve this condition.  Thus, we will need to monitor levels regularly (every 3-4 mo on average) to keep levels within normal limits and prevent over supplementation.   TREATMENT PLAN FOR OBESITY: BMI 32.0-32.9,adult - current BMI 32.73 Obesity, Beginning BMI  32.89 Assessment: Andrea Bridges is here to discuss her progress with her obesity treatment plan along with follow-up of her obesity related diagnoses. See Medical Weight Management Flowsheet for complete bioelectrical impedance results.  Condition is not optimized.  Since last office visit on 10/14/22 patient's  Muscle mass has decreased by 9.2 lb. Fat mass has increased by 14.6 lb. Total body water has increased by 4.6 lb.  Counseling done on how various foods will affect these numbers and how to maximize success  Total lbs lost to date: 1  Total weight loss percentage to date: 0.48   Plan: Continue the Category 1 meal plan.   - Advised pt to aim to eat foods with a 10:1 ratio of calories to protein.   Behavioral Intervention Additional resources provided today: category 1 meal plan information Evidence-based interventions for health behavior change were utilized today including the discussion of self monitoring techniques, problem-solving barriers and SMART goal setting techniques.   Regarding patient's less desirable eating habits and patterns, we employed the technique of small changes.  Pt will specifically work on: following meal plan more closely and increasing her lean protein intake.  for next visit.    Recommended Physical Activity Goals  Andrea Bridges has been advised to slowly work up to 150 minutes of moderate intensity aerobic activity a week and strengthening exercises 2-3 times per week for cardiovascular health, weight loss maintenance and preservation of muscle mass.   She  has agreed to Continue current level of physical activity   FOLLOW UP: Return in about 20 days (around 11/17/2022). She was informed of the importance of frequent follow up visits to maximize her success with intensive lifestyle modifications for her multiple health conditions.   Subjective:   Chief complaint: Obesity Andrea Bridges is here to discuss her progress with her obesity treatment plan. She is on the the  Category 1 Plan and states she is following her eating plan approximately 60% of the time. She states she is walking 10,000 steps, 2 times a week  Interval History:  Andrea Bridges is here for a follow up office visit. Since last OV, Andrea Bridges has been doing well. She endorses traveling more and has therefore been doing more outside eating. She reports trying to be more mindful and consciousness about her food choices.  Pharmacotherapy for weight loss: She is currently taking  Metformin  for medical weight loss.  Denies side effects.    Review of Systems:  Pertinent positives were addressed with patient today.  Reviewed by clinician on day of visit: allergies, medications, problem list, medical history, surgical history, family history, social history, and previous encounter notes.  Weight Summary and Biometrics   Weight Lost Since Last Visit: 0 lb  Weight Gained Since Last Visit: 5 lb    Vitals Temp: 97.8 F (36.6 C) BP: 113/72 Pulse Rate: 60 SpO2: 99 %   Anthropometric Measurements Height: 5\' 7"  (1.702 m) Weight: 209 lb (94.8 kg) BMI (Calculated): 32.73 Weight at Last Visit: 204 lb Weight Lost Since Last Visit: 0 lb Weight Gained Since Last Visit: 5 lb Starting Weight: 210 lb Total Weight Loss (lbs): 1 lb (0.454 kg)   Body Composition  Body Fat %: 42.1 % Fat Mass (lbs): 88.2 lbs Muscle Mass (lbs): 115.2 lbs Total Body Water (lbs): 84.2 lbs Visceral Fat Rating : 12   Other Clinical Data Fasting: no Labs: no Today's Visit #: 4 Starting Date: 09/09/22   Objective:   PHYSICAL EXAM: Blood pressure 113/72, pulse 60, temperature 97.8 F (36.6 C), height 5\' 7"  (1.702 m), weight 209 lb (94.8 kg), SpO2 99 %. Body mass index is 32.73 kg/m.  General: Well Developed, well nourished, and in no acute distress.  HEENT: Normocephalic, atraumatic Skin: Warm and dry, cap RF less 2 sec, good turgor Chest:  Normal excursion, shape, no gross abn Respiratory: speaking in full  sentences, no conversational dyspnea NeuroM-Sk: Ambulates w/o assistance, moves * 4 Psych: A and O *3, insight good, mood-full  DIAGNOSTIC DATA REVIEWED:  BMET    Component Value Date/Time   NA 140 09/08/2022 0854   K 5.0 09/08/2022 0854   CL 100 09/08/2022 0854   CO2 24 09/08/2022 0854   GLUCOSE 88 09/08/2022 0854   BUN 16 09/08/2022 0854   CREATININE 0.83 09/08/2022 0854   CALCIUM 9.7 09/08/2022 0854   Lab Results  Component Value Date   HGBA1C 5.5 09/08/2022   Lab Results  Component Value Date   INSULIN 9.3 09/08/2022   Lab Results  Component Value Date   TSH 1.280 09/08/2022   CBC    Component Value Date/Time   WBC 5.6 09/08/2022 0854   RBC 5.13 09/08/2022 0854   HGB 13.5 09/08/2022 0854   HCT 43.1 09/08/2022 0854   PLT 219 09/08/2022 0854   MCV 84 09/08/2022 0854   MCH 26.3 (L) 09/08/2022 0854   MCHC 31.3 (L) 09/08/2022 0854   RDW 14.7 09/08/2022 0854   Iron Studies No  results found for: "IRON", "TIBC", "FERRITIN", "IRONPCTSAT" Lipid Panel     Component Value Date/Time   CHOL 235 (H) 09/08/2022 0854   TRIG 110 09/08/2022 0854   HDL 90 09/08/2022 0854   CHOLHDL 2.6 09/08/2022 0854   LDLCALC 126 (H) 09/08/2022 0854   Hepatic Function Panel     Component Value Date/Time   PROT 6.6 09/08/2022 0854   ALBUMIN 4.5 09/08/2022 0854   AST 22 09/08/2022 0854   ALT 16 09/08/2022 0854   ALKPHOS 81 09/08/2022 0854   BILITOT 0.4 09/08/2022 0854      Component Value Date/Time   TSH 1.280 09/08/2022 0854   Nutritional Lab Results  Component Value Date   VD25OH 20.9 (L) 09/08/2022    Attestations:   I, Special Puri, acting as a Stage manager for Thomasene Lot, DO., have compiled all relevant documentation for today's office visit on behalf of Thomasene Lot, DO, while in the presence of [employer], DO.  I have reviewed the above documentation for accuracy and completeness, and I agree with the above. [name], D.O.  The 21st Century  Cures Act was signed into law in 2016 which includes the topic of electronic health records.  This provides immediate access to information in MyChart.  This includes consultation notes, operative notes, office notes, lab results and pathology reports.  If you have any questions about what you read please let us know at your next visit so we can discuss your concerns and take corrective action if need be.  We are right here with you.

## 2022-11-08 ENCOUNTER — Telehealth (INDEPENDENT_AMBULATORY_CARE_PROVIDER_SITE_OTHER): Payer: No Typology Code available for payment source | Admitting: Psychology

## 2022-11-08 DIAGNOSIS — F5089 Other specified eating disorder: Secondary | ICD-10-CM

## 2022-11-08 NOTE — Progress Notes (Signed)
  Office: [phone]  /  Fax: [phone]    Date: November 08, 2022  Appointment Start Time: 4:01pm Duration: 23 minutes Provider: Lawerance Cruel, Psy.D. Type of Session: Individual Therapy  Location of Patient: Work (private location) Location of Provider: Provider's Home (private office) Type of Contact: Telepsychological Visit via MyChart Video Visit  Session Content: Andrea Bridges is a 62 y.o. female presenting for a follow-up appointment to address the previously established treatment goal of increasing coping skills.Today's appointment was a telepsychological visit. Andrea Bridges provided verbal consent for today's telepsychological appointment and she is aware she is responsible for securing confidentiality on her end of the session. Prior to proceeding with today's appointment, Andrea Bridges's physical location at the time of this appointment was obtained as well a phone number she could be reached at in the event of technical difficulties. Andrea Bridges and this provider participated in today's telepsychological service.   This provider conducted a brief check-in. Andrea Bridges shared she has been "busy," noting no significant changes. Regarding eating habits, she reported focusing on protein intake, but acknowledged she does not always eat congruent to her structured meal plan. Further explored and processed. Reviewed her prescribed structured meal plan, and she was engaged in problem solving to assist in improving adherence. Andrea Bridges was receptive to today's appointment as evidenced by openness to sharing, responsiveness to feedback, and willingness to implement discussed strategies .  Mental Status Examination:  Appearance: neat Behavior: appropriate to circumstances Mood: neutral Affect: mood congruent Speech: WNL Eye Contact: appropriate Psychomotor Activity: WNL Gait: unable to assess Thought Process: linear, logical, and goal directed and no evidence or endorsement of suicidal, homicidal, and self-harm ideation, plan  and intent  Thought Content/Perception: no hallucinations, delusions, bizarre thinking or behavior endorsed or observed Orientation: AAOx4 Memory/Concentration: memory, attention, language, and fund of knowledge intact  Insight: fair Judgment: fair  Interventions:  Conducted a brief chart review Provided empathic reflections and validation Employed supportive psychotherapy interventions to facilitate reduced distress and to improve coping skills with identified stressors Engaged patient in problem solving  DSM-5 Diagnosis(es): F50.89 Other Specified Feeding or Eating Disorder, Emotional Eating Behaviors  Treatment Goal & Progress: During the initial appointment with this provider, the following treatment goal was established: increase coping skills. Progress is limited, as Yorleny has just begun treatment with this provider; however, she is receptive to the interaction and interventions and rapport is being established.   Plan: The next appointment is scheduled for 11/29/2022 at 11:30am, which will be via MyChart Video Visit. The next session will focus on working towards the established treatment goal.

## 2022-11-16 ENCOUNTER — Telehealth (INDEPENDENT_AMBULATORY_CARE_PROVIDER_SITE_OTHER): Payer: Self-pay

## 2022-11-16 NOTE — Telephone Encounter (Signed)
Called patient to see if she wanted to move her appt to the 10AM 11/17/22 open slot. No answer, left message.

## 2022-11-17 ENCOUNTER — Ambulatory Visit (INDEPENDENT_AMBULATORY_CARE_PROVIDER_SITE_OTHER): Payer: No Typology Code available for payment source | Admitting: Family Medicine

## 2022-11-17 ENCOUNTER — Encounter (INDEPENDENT_AMBULATORY_CARE_PROVIDER_SITE_OTHER): Payer: Self-pay | Admitting: Family Medicine

## 2022-11-17 VITALS — BP 106/72 | HR 59 | Temp 97.5°F | Ht 67.0 in | Wt 207.0 lb

## 2022-11-17 DIAGNOSIS — E88819 Insulin resistance, unspecified: Secondary | ICD-10-CM

## 2022-11-17 DIAGNOSIS — E669 Obesity, unspecified: Secondary | ICD-10-CM | POA: Diagnosis not present

## 2022-11-17 DIAGNOSIS — E559 Vitamin D deficiency, unspecified: Secondary | ICD-10-CM | POA: Diagnosis not present

## 2022-11-17 DIAGNOSIS — Z6832 Body mass index (BMI) 32.0-32.9, adult: Secondary | ICD-10-CM | POA: Diagnosis not present

## 2022-11-17 MED ORDER — VITAMIN D (ERGOCALCIFEROL) 1.25 MG (50000 UNIT) PO CAPS: 50000.0000 [IU] | ORAL_CAPSULE | ORAL | 0 refills | Status: DC

## 2022-11-17 MED ORDER — METFORMIN HCL 500 MG PO TABS
ORAL_TABLET | ORAL | 0 refills | Status: AC
Start: 2022-11-17 — End: ?

## 2022-11-17 NOTE — Progress Notes (Signed)
Andrea Bridges, D.O.  ABFM, ABOM Specializing in Clinical Bariatric Medicine  Office located at: [address] [place], [place]  [address]     Assessment and Plan:   Medications Discontinued During This Encounter  Medication Reason   Bacillus Coagulans-Inulin (PROBIOTIC) 1-250 BILLION-MG CAPS    Biotin (BIOTIN 5000) 5 MG CAPS    cyanocobalamin (VITAMIN B12) 500 MCG tablet    magnesium oxide (MAG-OX) 400 (240 Mg) MG tablet    zinc gluconate 50 MG tablet    Cholecalciferol (VITAMIN D3) 75 MCG (3000 UT) TABS    Vitamin D, Ergocalciferol, (DRISDOL) 1.25 MG (50000 UNIT) CAPS capsule Reorder   metFORMIN (GLUCOPHAGE) 500 MG tablet Reorder     Meds ordered this encounter  Medications   Vitamin D, Ergocalciferol, (DRISDOL) 1.25 MG (50000 UNIT) CAPS capsule    Sig: Take 1 capsule (50,000 Units total) by mouth every 7 (seven) days.    Dispense:  4 capsule    Refill:  0   metFORMIN (GLUCOPHAGE) 500 MG tablet    Sig: 1 po with lunch and dinner daily    Dispense:  60 tablet    Refill:  0    30 d supply;  ** OV for RF **   Do not send RF request     Insulin resistance Assessment: Condition is Not at goal.Her Metformin was increased to 500mg  BID last visit and she continues it without difficulty or any GI upset. She inquired about other potential medications to take and informed me that her insurance completely covers Wegovy. Her hunger and cravings are pretty well controlled.   Lab Results  Component Value Date   HGBA1C 5.5 09/08/2022   INSULIN 9.3 09/08/2022    Plan: She will continue Metformin as directed. Will refill  today.    We discussed the risks and benefits of Wegovy which can help [place] in the management of this disease process as well as with weight loss. Will consider starting Wegovy in the future as we will focus on prudent nutritional plan at this time. Andrea Bridges will continue to follow our prudent meal plan. We will recheck A1c and fasting insulin level in  approximately 3 months from last check, or as deemed appropriate.    Vitamin D deficiency Assessment: Condition is Not at goal. Her vitamin D level is not within the normal range of 50-80. She continues Ergocalciferol 50K IU weekly.    Lab Results  Component Value Date   VD25OH 20.9 (L) 09/08/2022   Plan: She will continue ERGO. Will refill today. Will continue to monitor levels regularly (every 3-4 mo on average) to keep levels within normal limits and prevent over supplementation.   TREATMENT PLAN FOR OBESITY: BMI 32.0-32.9,adult - current BMI 32.41 Obesity, Beginning BMI 32.89 Assessment: Andrea Bridges is here to discuss her progress with her obesity treatment plan along with follow-up of her obesity related diagnoses. See Medical Weight Management Flowsheet for complete bioelectrical impedance results.  Condition is docourse: improving. Biometric data collected today, was reviewed with patient.   Since last office visit on 06/20/20024 patient's  Muscle mass has increased by 0.4lb. Fat mass has decreased by 3.2lb. Total body water has decreased by 2.4lb.  Counseling done on how various foods will affect these numbers and how to maximize success  Total lbs lost to date: 3lbs Total weight loss percentage to date: 1.43%   Plan: Continue the Category 1 meal plan.   Behavioral Intervention Additional resources provided today: patient declined Evidence-based  interventions for health behavior change were utilized today including the discussion of self monitoring techniques, problem-solving barriers and SMART goal setting techniques.   Regarding patient's less desirable eating habits and patterns, we employed the technique of small changes.  Pt will specifically work on: following meal plan more closely and increasing her lean protein intake for next visit.    Recommended Physical Activity Goals  Andrea Bridges has been advised to slowly work up to 150 minutes of moderate intensity aerobic activity  a week and strengthening exercises 2-3 times per week for cardiovascular health, weight loss maintenance and preservation of muscle mass.   She has agreed to Continue current level of physical activity   FOLLOW UP: Return in about 2 weeks (around 12/01/2022). She was informed of the importance of frequent follow up visits to maximize her success with intensive lifestyle modifications for her multiple health conditions.  Subjective:   Chief complaint: Obesity Andrea Bridges is here to discuss her progress with her obesity treatment plan. She is on the Category 1 Plan and states she is following her eating plan approximately 50% of the time. She states she is exercising 30 minutes 2 days per week.  Interval History:  Andrea Bridges is here for a follow up office visit. Since last OV she states that she feels like she has been stagnant in her weight loss goal. She states that she feels "disgusted" looking in the mirror and is tired about being stressed about meeting her goals.  She continues using the weightwatchers app but only documents her lunches. She continues to see Dr. Dewaine Conger and feels she needs more "mind" counseling to get her focused, determined, and motivated on her weight loss journey.   Pharmacotherapy for weight loss: She is currently taking  Metformin  for medical weight loss.  Denies side effects.    Review of Systems:  Pertinent positives were addressed with patient today.  Reviewed by clinician on day of visit: allergies, medications, problem list, medical history, surgical history, family history, social history, and previous encounter notes.  Weight Summary and Biometrics   Weight Lost Since Last Visit: 2lb  Weight Gained Since Last Visit: 0lb    Vitals Temp: (!) 97.5 F (36.4 C) BP: 106/72 Pulse Rate: (!) 59 SpO2: 97 %   Anthropometric Measurements Height: 5\' 7"  (1.702 m) Weight: 207 lb (93.9 kg) BMI (Calculated): 32.41 Weight at Last Visit: 209lb Weight Lost Since Last  Visit: 2lb Weight Gained Since Last Visit: 0lb Starting Weight: 210lb Total Weight Loss (lbs): 3 lb (1.361 kg)   Body Composition  Body Fat %: 41.1 % Fat Mass (lbs): 85 lbs Muscle Mass (lbs): 115.8 lbs Total Body Water (lbs): 81.8 lbs Visceral Fat Rating : 11   Other Clinical Data Fasting: no Labs: no Today's Visit #: 5 Starting Date: 09/09/22   Objective:   PHYSICAL EXAM: Blood pressure 106/72, pulse (!) 59, temperature (!) 97.5 F (36.4 C), height 5\' 7"  (1.702 m), weight 207 lb (93.9 kg), SpO2 97 %. Body mass index is 32.42 kg/m.  General: Well Developed, well nourished, and in no acute distress.  HEENT: Normocephalic, atraumatic Skin: Warm and dry, cap RF less 2 sec, good turgor Chest:  Normal excursion, shape, no gross abn Respiratory: speaking in full sentences, no conversational dyspnea NeuroM-Sk: Ambulates w/o assistance, moves * 4 Psych: A and O *3, insight good, mood-full  DIAGNOSTIC DATA REVIEWED:  BMET    Component Value Date/Time   NA 140 09/08/2022 0854   K 5.0 09/08/2022 0854  CL 100 09/08/2022 0854   CO2 24 09/08/2022 0854   GLUCOSE 88 09/08/2022 0854   BUN 16 09/08/2022 0854   CREATININE 0.83 09/08/2022 0854   CALCIUM 9.7 09/08/2022 0854   Lab Results  Component Value Date   HGBA1C 5.5 09/08/2022   Lab Results  Component Value Date   INSULIN 9.3 09/08/2022   Lab Results  Component Value Date   TSH 1.280 09/08/2022   CBC    Component Value Date/Time   WBC 5.6 09/08/2022 0854   RBC 5.13 09/08/2022 0854   HGB 13.5 09/08/2022 0854   HCT 43.1 09/08/2022 0854   PLT 219 09/08/2022 0854   MCV 84 09/08/2022 0854   MCH 26.3 (L) 09/08/2022 0854   MCHC 31.3 (L) 09/08/2022 0854   RDW 14.7 09/08/2022 0854   Iron Studies No results found for: "IRON", "TIBC", "FERRITIN", "IRONPCTSAT" Lipid Panel     Component Value Date/Time   CHOL 235 (H) 09/08/2022 0854   TRIG 110 09/08/2022 0854   HDL 90 09/08/2022 0854   CHOLHDL 2.6 09/08/2022  0854   LDLCALC 126 (H) 09/08/2022 0854   Hepatic Function Panel     Component Value Date/Time   PROT 6.6 09/08/2022 0854   ALBUMIN 4.5 09/08/2022 0854   AST 22 09/08/2022 0854   ALT 16 09/08/2022 0854   ALKPHOS 81 09/08/2022 0854   BILITOT 0.4 09/08/2022 0854      Component Value Date/Time   TSH 1.280 09/08/2022 0854   Nutritional Lab Results  Component Value Date   VD25OH 20.9 (L) 09/08/2022    Attestations:   I, Special Puri, acting as a Stage manager for [employer], DO., have compiled all relevant documentation for today's office visit on behalf of Thomasene Lot, DO, while in the presence of [employer], DO.  I have reviewed the above documentation for accuracy and completeness, and I agree with the above. Andrea Bridges, D.O.  The 21st Century Cures Act was signed into law in 2016 which includes the topic of electronic health records.  This provides immediate access to information in MyChart.  This includes consultation notes, operative notes, office notes, lab results and pathology reports.  If you have any questions about what you read please let us know at your next visit so we can discuss your concerns and take corrective action if need be.  We are right here with you.

## 2022-11-24 ENCOUNTER — Encounter (INDEPENDENT_AMBULATORY_CARE_PROVIDER_SITE_OTHER): Payer: No Typology Code available for payment source | Admitting: Family Medicine

## 2022-11-29 ENCOUNTER — Telehealth (INDEPENDENT_AMBULATORY_CARE_PROVIDER_SITE_OTHER): Payer: No Typology Code available for payment source | Admitting: Psychology

## 2022-11-29 DIAGNOSIS — F5089 Other specified eating disorder: Secondary | ICD-10-CM | POA: Diagnosis not present

## 2022-11-29 NOTE — Progress Notes (Signed)
  Office: [phone]  /  Fax: [phone]    Date: November 29, 2022  Appointment Start Time: 11:32am Duration: 26 minutes Provider: Lawerance Cruel, Psy.D. Type of Session: Individual Therapy  Location of Patient: Work (private location) Location of Provider: Provider's Home (private office) Type of Contact: Telepsychological Visit via MyChart Video Visit  Session Content: Andrea Bridges is a 62 y.o. female presenting for a follow-up appointment to address the previously established treatment goal of increasing coping skills.Today's appointment was a telepsychological visit. Andrea Bridges provided verbal consent for today's telepsychological appointment and she is aware she is responsible for securing confidentiality on her end of the session. Prior to proceeding with today's appointment, Andrea Bridges's physical location at the time of this appointment was obtained as well a phone number she could be reached at in the event of technical difficulties. Andrea Bridges and this provider participated in today's telepsychological service.   This provider conducted a brief check-in. Andrea Bridges shared about recent events, adding she is "failing at dieting." Further explored. It was identified she is still experiencing a dieting mentality versus a lifestyle change. Thus, today's appointment focused on providing psychoeducation regarding the aforementioned. Overall, Andrea Bridges was receptive to today's appointment as evidenced by openness to sharing and responsiveness to feedback.  Mental Status Examination:  Appearance: neat Behavior: appropriate to circumstances Mood: neutral Affect: mood congruent Speech: WNL Eye Contact: appropriate Psychomotor Activity: WNL Gait: unable to assess Thought Process: linear, logical, and goal directed and no evidence or endorsement of suicidal, homicidal, and self-harm ideation, plan and intent  Thought Content/Perception: no hallucinations, delusions, bizarre thinking or behavior endorsed or  observed Orientation: AAOx4 Memory/Concentration: memory, attention, language, and fund of knowledge intact  Insight: fair Judgment: fair  Interventions:  Provided empathic reflections and validation Employed supportive psychotherapy interventions to facilitate reduced distress and to improve coping skills with identified stressors Employed motivational interviewing skills to assess patient's willingness/desire to adhere to recommended medical treatments and assignments Psychoeducation provided regarding dieting mentality vs. mindful eating/lifestyle change  DSM-5 Diagnosis(es): F50.89 Other Specified Feeding or Eating Disorder, Emotional Eating Behaviors  Treatment Goal & Progress: During the initial appointment with this provider, the following treatment goal was established: increase coping skills. Andrea Bridges has demonstrated progress in her goal as evidenced by increased awareness of hunger patterns. Andrea Bridges also continues to demonstrate willingness to engage in learned skill(s).  Plan: Based on appointment availability and Andrea Bridges's schedule, the next appointment is scheduled for 01/03/2023 at 11:30am, which will be via MyChart Video Visit. The next session will focus on working towards the established treatment goal.

## 2022-12-02 ENCOUNTER — Encounter (INDEPENDENT_AMBULATORY_CARE_PROVIDER_SITE_OTHER): Payer: Self-pay | Admitting: Family Medicine

## 2022-12-02 ENCOUNTER — Ambulatory Visit (INDEPENDENT_AMBULATORY_CARE_PROVIDER_SITE_OTHER): Payer: No Typology Code available for payment source | Admitting: Family Medicine

## 2022-12-02 VITALS — BP 100/69 | HR 82 | Temp 97.8°F | Ht 67.0 in | Wt 206.0 lb

## 2022-12-02 DIAGNOSIS — F5089 Other specified eating disorder: Secondary | ICD-10-CM

## 2022-12-02 DIAGNOSIS — E88819 Insulin resistance, unspecified: Secondary | ICD-10-CM | POA: Diagnosis not present

## 2022-12-02 DIAGNOSIS — E669 Obesity, unspecified: Secondary | ICD-10-CM

## 2022-12-02 DIAGNOSIS — E559 Vitamin D deficiency, unspecified: Secondary | ICD-10-CM | POA: Diagnosis not present

## 2022-12-02 DIAGNOSIS — Z6832 Body mass index (BMI) 32.0-32.9, adult: Secondary | ICD-10-CM

## 2022-12-02 MED ORDER — VITAMIN D (ERGOCALCIFEROL) 1.25 MG (50000 UNIT) PO CAPS: 50000.0000 [IU] | ORAL_CAPSULE | ORAL | 0 refills | Status: DC

## 2022-12-02 NOTE — Progress Notes (Signed)
Andrea Bridges, D.O.  ABFM, ABOM Specializing in Clinical Bariatric Medicine  Office located at: [address] [place], [place]  [address]     Assessment and Plan:   Medications Discontinued During This Encounter  Medication Reason   Vitamin D, Ergocalciferol, (DRISDOL) 1.25 MG (50000 UNIT) CAPS capsule Reorder     Meds ordered this encounter  Medications   Vitamin D, Ergocalciferol, (DRISDOL) 1.25 MG (50000 UNIT) CAPS capsule    Sig: Take 1 capsule (50,000 Units total) by mouth every 7 (seven) days.    Dispense:  4 capsule    Refill:  0     Insulin resistance Assessment & Plan: Endorses not taking her Metformin 500 mg bid consistently because she was on vacation. In general, she notices not feeling any difference on the Metformin when taking it. Denies any GI upset.   Lab Results  Component Value Date   HGBA1C 5.5 09/08/2022   INSULIN 9.3 09/08/2022    Briefly discussed the importance of Metformin in controlling hunger/cravings. Encouraged pt to take Metformin consistently for 2 months to see noticeable changes. Andrea Bridges will continue to work on weight loss, exercise, via their meal plan we devised. We will recheck A1c and fasting insulin level in approximately 1 month.   Vitamin D deficiency Assessment & Plan: Andrea Bridges reports taking ERGO 50 K lU once weekly and is tolerating supplement well. No concerns.   Lab Results  Component Value Date   VD25OH 20.9 (L) 09/08/2022   Pt advised to maintain with ERGO at current dose. Will refill ERGO today.  Will continue to monitor condition expectantly - recheck levels in roughly 1 month.    Other Specified Feeding or Eating Disorder, Emotional Eating Behaviors Assessment & Plan:  Denies any SI/HI. Mood is stable. Pt has been meeting with Dr.Barker and endorses learning useful mindset tips. No issues with Zoloft 50 mg daily. Denies any side effects.   Continue with Zoloft per specialist and continue meeting with  Dr.Barker. Reminded patient of the importance of following their prudent nutrition plan and how food can affect mood as well to support emotional wellbeing.  We will continue to monitor closely as it relates to her weight loss journey.    TREATMENT PLAN FOR OBESITY: BMI 32.0-32.9,adult - current BMI 32.26 Obesity, Beginning BMI 32.89 Assessment & Plan:  Andrea Bridges is here to discuss her progress with her obesity treatment plan along with follow-up of her obesity related diagnoses. See Medical Weight Management Flowsheet for complete bioelectrical impedance results.  Condition is not optimized. Biometric data collected today, was reviewed with patient.   Since last office visit on 11/17/22 patient's  Muscle mass has decreased by 2.2 lb. Fat mass has increased by 1.6 lb. Total body water has not changed. Counseling done on how various foods will affect these numbers and how to maximize success  Total lbs lost to date: 4 lbs  Total weight loss percentage to date: 1.90%  Educated pt that good snacks for controlling hunger and cravings have a 10:1 ratio of calories to protein. Pt will continue the Category 1 meal plan.   Behavioral Intervention Additional resources provided today: patient declined Evidence-based interventions for health behavior change were utilized today including the discussion of self monitoring techniques, problem-solving barriers and SMART goal setting techniques.   Regarding patient's less desirable eating habits and patterns, we employed the technique of small changes.  Pt will specifically work on: consistently taking her Metformin twice daily and possibly exploring the  Atomic Habits book for next visit.    Recommended Physical Activity Goals  Andrea Bridges has been advised to slowly work up to 150 minutes of moderate intensity aerobic activity a week and strengthening exercises 2-3 times per week for cardiovascular health, weight loss maintenance and preservation of muscle  mass. She has agreed to Continue current level of physical activity   FOLLOW UP: Return in about 2 weeks (around 12/16/2022). She was informed of the importance of frequent follow up visits to maximize her success with intensive lifestyle modifications for her multiple health conditions.  Subjective:   Chief complaint: Obesity Andrea Bridges is here to discuss her progress with her obesity treatment plan. She is on the Category 1 Plan and states she is following her eating plan approximately 50% of the time. She states she is not exercising.  Interval History:  Andrea Bridges is here for a follow up office visit. Since last OV, Andrea Bridges has been doing well. She has been meeting with Dr.Barker and is finding it helpful. For snacks, she typically eats a tablespoon of peanut butter on a slice of light bread. Recently, she ordered a flat treadmill and is looking forward to using it. No complaints of hunger and cravings today.   Pharmacotherapy for weight loss: She is currently taking  Metformin  for medical weight loss.  Denies side effects.    Review of Systems:  Pertinent positives were addressed with patient today.  Reviewed by clinician on day of visit: allergies, medications, problem list, medical history, surgical history, family history, social history, and previous encounter notes.  Weight Summary and Biometrics   Weight Lost Since Last Visit: 1lb  Weight Gained Since Last Visit: 0lb   Vitals Temp: 97.8 F (36.6 C) BP: 100/69 Pulse Rate: 82 SpO2: 98 %   Anthropometric Measurements Height: 5\' 7"  (1.702 m) Weight: 206 lb (93.4 kg) BMI (Calculated): 32.26 Weight at Last Visit: 207lb Weight Lost Since Last Visit: 1lb Weight Gained Since Last Visit: 0lb Starting Weight: 210lb Total Weight Loss (lbs): 4 lb (1.814 kg)   Body Composition  Body Fat %: 41.9 % Fat Mass (lbs): 86.4 lbs Muscle Mass (lbs): 113.6 lbs Total Body Water (lbs): 81.8 lbs Visceral Fat Rating : 11   Other Clinical  Data Fasting: no Labs: no Today's Visit #: 6 Starting Date: 09/09/22   Objective:   PHYSICAL EXAM: Blood pressure 100/69, pulse 82, temperature 97.8 F (36.6 C), height 5\' 7"  (1.702 m), weight 206 lb (93.4 kg), SpO2 98%. Body mass index is 32.26 kg/m.  General: Well Developed, well nourished, and in no acute distress.  HEENT: Normocephalic, atraumatic Skin: Warm and dry, cap RF less 2 sec, good turgor Chest:  Normal excursion, shape, no gross abn Respiratory: speaking in full sentences, no conversational dyspnea NeuroM-Sk: Ambulates w/o assistance, moves * 4 Psych: A and O *3, insight good, mood-full  DIAGNOSTIC DATA REVIEWED:  BMET    Component Value Date/Time   NA 140 09/08/2022 0854   K 5.0 09/08/2022 0854   CL 100 09/08/2022 0854   CO2 24 09/08/2022 0854   GLUCOSE 88 09/08/2022 0854   BUN 16 09/08/2022 0854   CREATININE 0.83 09/08/2022 0854   CALCIUM 9.7 09/08/2022 0854   Lab Results  Component Value Date   HGBA1C 5.5 09/08/2022   Lab Results  Component Value Date   INSULIN 9.3 09/08/2022   Lab Results  Component Value Date   TSH 1.280 09/08/2022   CBC    Component Value Date/Time  WBC 5.6 09/08/2022 0854   RBC 5.13 09/08/2022 0854   HGB 13.5 09/08/2022 0854   HCT 43.1 09/08/2022 0854   PLT 219 09/08/2022 0854   MCV 84 09/08/2022 0854   MCH 26.3 (L) 09/08/2022 0854   MCHC 31.3 (L) 09/08/2022 0854   RDW 14.7 09/08/2022 0854   Iron Studies No results found for: "IRON", "TIBC", "FERRITIN", "IRONPCTSAT" Lipid Panel     Component Value Date/Time   CHOL 235 (H) 09/08/2022 0854   TRIG 110 09/08/2022 0854   HDL 90 09/08/2022 0854   CHOLHDL 2.6 09/08/2022 0854   LDLCALC 126 (H) 09/08/2022 0854   Hepatic Function Panel     Component Value Date/Time   PROT 6.6 09/08/2022 0854   ALBUMIN 4.5 09/08/2022 0854   AST 22 09/08/2022 0854   ALT 16 09/08/2022 0854   ALKPHOS 81 09/08/2022 0854   BILITOT 0.4 09/08/2022 0854      Component Value  Date/Time   TSH 1.280 09/08/2022 0854   Nutritional Lab Results  Component Value Date   VD25OH 20.9 (L) 09/08/2022    Attestations:   I, Special Puri , acting as a Stage manager for Thomasene Lot, DO., have compiled all relevant documentation for today's office visit on behalf of Thomasene Lot, DO, while in the presence of [employer], DO.  I have reviewed the above documentation for accuracy and completeness, and I agree with the above. Andrea Bridges, D.O.  The 21st Century Cures Act was signed into law in 2016 which includes the topic of electronic health records.  This provides immediate access to information in MyChart.  This includes consultation notes, operative notes, office notes, lab results and pathology reports.  If you have any questions about what you read please let us know at your next visit so we can discuss your concerns and take corrective action if need be.  We are right here with you.

## 2022-12-06 ENCOUNTER — Ambulatory Visit (INDEPENDENT_AMBULATORY_CARE_PROVIDER_SITE_OTHER): Payer: No Typology Code available for payment source | Admitting: Family Medicine

## 2022-12-15 ENCOUNTER — Ambulatory Visit (INDEPENDENT_AMBULATORY_CARE_PROVIDER_SITE_OTHER): Payer: No Typology Code available for payment source | Admitting: Family Medicine

## 2022-12-30 ENCOUNTER — Ambulatory Visit (INDEPENDENT_AMBULATORY_CARE_PROVIDER_SITE_OTHER): Payer: No Typology Code available for payment source | Admitting: Family Medicine

## 2023-01-03 ENCOUNTER — Telehealth (INDEPENDENT_AMBULATORY_CARE_PROVIDER_SITE_OTHER): Payer: No Typology Code available for payment source | Admitting: Psychology

## 2023-01-06 LAB — HEMOGLOBIN A1C: Hemoglobin A1C: 5.2

## 2023-01-06 LAB — LIPID PANEL
Cholesterol: 273 — AB (ref 0–200)
HDL: 102 — AB (ref 35–70)
LDL Cholesterol: 158
Triglycerides: 65 (ref 40–160)

## 2023-01-06 LAB — BASIC METABOLIC PANEL: Glucose: 102

## 2023-01-08 ENCOUNTER — Other Ambulatory Visit (INDEPENDENT_AMBULATORY_CARE_PROVIDER_SITE_OTHER): Payer: Self-pay | Admitting: Family Medicine

## 2023-02-08 ENCOUNTER — Encounter (INDEPENDENT_AMBULATORY_CARE_PROVIDER_SITE_OTHER): Payer: Self-pay | Admitting: Family Medicine

## 2023-02-08 ENCOUNTER — Ambulatory Visit (INDEPENDENT_AMBULATORY_CARE_PROVIDER_SITE_OTHER): Payer: No Typology Code available for payment source | Admitting: Family Medicine

## 2023-02-08 VITALS — BP 108/73 | HR 81 | Temp 98.0°F | Ht 67.0 in | Wt 208.0 lb

## 2023-02-08 DIAGNOSIS — E559 Vitamin D deficiency, unspecified: Secondary | ICD-10-CM

## 2023-02-08 DIAGNOSIS — E88819 Insulin resistance, unspecified: Secondary | ICD-10-CM

## 2023-02-08 DIAGNOSIS — E669 Obesity, unspecified: Secondary | ICD-10-CM

## 2023-02-08 DIAGNOSIS — Z6832 Body mass index (BMI) 32.0-32.9, adult: Secondary | ICD-10-CM | POA: Diagnosis not present

## 2023-02-08 MED ORDER — SEMAGLUTIDE-WEIGHT MANAGEMENT 0.25 MG/0.5ML ~~LOC~~ SOAJ
0.2500 mg | SUBCUTANEOUS | 0 refills | Status: DC
Start: 2023-02-08 — End: 2023-03-03

## 2023-02-08 MED ORDER — VITAMIN D (ERGOCALCIFEROL) 1.25 MG (50000 UNIT) PO CAPS
50000.0000 [IU] | ORAL_CAPSULE | ORAL | 0 refills | Status: DC
Start: 2023-02-08 — End: 2023-03-03

## 2023-02-08 NOTE — Progress Notes (Deleted)
Andrea Andrea Bridges, D.O.  ABFM, ABOM Specializing in Clinical Bariatric Medicine  Office located at: [address] [place], [place]  [address]     Assessment and Andrea Bridges:   Medications Discontinued During This Encounter  Medication Reason   metFORMIN (GLUCOPHAGE) 500 MG tablet    sertraline (ZOLOFT) 50 MG tablet    Vitamin D, Ergocalciferol, (DRISDOL) 1.25 MG (50000 UNIT) CAPS capsule Reorder     Meds ordered this encounter  Medications   Vitamin D, Ergocalciferol, (DRISDOL) 1.25 MG (50000 UNIT) CAPS capsule    Sig: Take 1 capsule (50,000 Units total) by mouth every 7 (seven) days.    Dispense:  4 capsule    Refill:  0   Semaglutide-Weight Management 0.25 MG/0.5ML SOAJ    Sig: Inject 0.25 mg into the skin once a week.    Dispense:  2 mL    Refill:  0   Pt had labs done at work - bring next OV   Insulin resistance Assessment & Andrea Bridges: Pt endorses that her compliance with Metformin 500 mg bid has been challenging. She often forgets to take the Metformin with food and reports having loose stools when taking it on an empty stomach. Pt reports having some hunger & cravings. Pt inquires about Andrea Andrea Bridges today, which she states is covered by her insurance.   We mutually agreed to discontinue the Metformin & initiate Wegovy 0.25 mg once wkly. Patient denies a personal history of pancreatitis;  and denies a family history of medullary thyroid carcinoma or multiple endocrine neoplasia type II.  I informed the patient that the demand for GLP-1-like medications is high and supply is low, therefore the medication can be difficult to obtain.  Strategies to obtain discussed with patient.  Potential risks/ benefits of the medication were explained to patient. Explained that the more she eats "off-Andrea Bridges", the more likely for her to have side effects of the drug.  All questions were answered appropriately and alternative treatment options were discussed; patient wishes to move forward with the  medication at this time.   Vitamin D deficiency Assessment & Andrea Bridges: Last vitamin D levels were 20.9 on 09/08/22.  Has been off for 1 month , will restart     Currently on ERGO 50,000 units every 7 days without any adverse SE. C/w their weight loss efforts and high-dose vitamin D - Will refill ERGO today.   Wil lrecehck in 2-3 mos.    BMI 32.0-32.9,adult - current BMI 32.57 Obesity, Beginning BMI 32.89 Assessment & Andrea Bridges: Since last office visit on 12/02/22 patient's  Muscle mass has increased by 2 lb. Fat mass has increased by 0.2 lb. Total body water has increased by 1.2 lb.  Counseling done on how various foods will affect these numbers and how to maximize success  Total lbs lost to date: - 2 lbs  Total weight loss percentage to date: 0.95%  Meal Andrea Bridges: Begin journaling 1,000 calories & 80+ grams of protein with the Category 1 meal Andrea Bridges   I discussed with pt the potential benefits of tracking the foods they eat and drink. Journaling can help one further understand their eating habits and patterns and it's a great tool to make more conscious meal choices throughout the day.    Behavioral Intervention Additional resources provided today: food journaling log handout, healthy tuna salad recipe  Evidence-based interventions for health behavior change were utilized today including the discussion of self monitoring techniques, problem-solving barriers and SMART goal setting techniques.   Regarding  patient's less desirable eating habits and patterns, we employed the technique of small changes.  Pt will specifically work on: begin journaling her intake/brining in food log  for next visit.    FOLLOW UP: Return in 3-4 wks. She was informed of the importance of frequent follow up visits to maximize her success with intensive lifestyle modifications for her multiple health conditions.  Subjective:   Chief complaint: Obesity Andrea Andrea Bridges.  She is on the Category 1 Andrea Bridges and states she is following her eating Andrea Bridges approximately 25% of the time. She states she is walking on the treadmill 10-15 minutes, 2 days a week,   Interval History:  Andrea Andrea Bridges is here for a follow up office visit. Since last OV, Andrea Bridges has been doing well. Pt has been busy due to work and traveling & therefore endorses not being in control of what she's eating. She purchased a [employer] membership, but has not gone yet. Pt desires to start Andrea Andrea Bridges - she states that her insurance covers it.   Review of Systems:  Pertinent positives were addressed with patient today.  Reviewed by clinician on day of visit: allergies, medications, problem list, medical history, surgical history, family history, social history, and previous encounter notes.  Weight Summary and Biometrics   Weight Lost Since Last Visit: 0lb  Weight Gained Since Last Visit: 2lb   Vitals Temp: 98 F (36.7 C) BP: 108/73 Pulse Rate: 81 SpO2: 97 %   Anthropometric Measurements Height: 5\' 7"  (1.702 m) Weight: 208 lb (94.3 kg) BMI (Calculated): 32.57 Weight at Last Visit: 206lb Weight Lost Since Last Visit: 0lb Weight Gained Since Last Visit: 2lb Starting Weight: 210lb Total Weight Loss (lbs): 2 lb (0.907 kg)   Body Composition  Body Fat %: 41.6 % Fat Mass (lbs): 86.6 lbs Muscle Mass (lbs): 115.6 lbs Total Body Water (lbs): 83 lbs Visceral Fat Rating : 11   Other Clinical Data Fasting: no Labs: no Today's Visit #: 7 Starting Date: 09/09/22   Objective:   PHYSICAL EXAM: Blood pressure 108/73, pulse 81, temperature 98 F (36.7 C), height 5\' 7"  (1.702 m), weight 208 lb (94.3 kg), SpO2 97%. Body mass index is 32.58 kg/m.  General: Well Developed, well nourished, and in no acute distress.  HEENT: Normocephalic, atraumatic Skin: Warm and dry, cap RF less 2 sec, good turgor Chest:  Normal excursion, shape, no gross abn Respiratory: speaking in full sentences, no  conversational dyspnea NeuroM-Sk: Ambulates w/o assistance, moves * 4 Psych: A and O *3, insight good, mood-full  DIAGNOSTIC DATA REVIEWED:  BMET    Component Value Date/Time   NA 140 09/08/2022 0854   K 5.0 09/08/2022 0854   CL 100 09/08/2022 0854   CO2 24 09/08/2022 0854   GLUCOSE 88 09/08/2022 0854   BUN 16 09/08/2022 0854   CREATININE 0.83 09/08/2022 0854   CALCIUM 9.7 09/08/2022 0854   Lab Results  Component Value Date   HGBA1C 5.5 09/08/2022   Lab Results  Component Value Date   INSULIN 9.3 09/08/2022   Lab Results  Component Value Date   TSH 1.280 09/08/2022   CBC    Component Value Date/Time   WBC 5.6 09/08/2022 0854   RBC 5.13 09/08/2022 0854   HGB 13.5 09/08/2022 0854   HCT 43.1 09/08/2022 0854   PLT 219 09/08/2022 0854   MCV 84 09/08/2022 0854   MCH 26.3 (L) 09/08/2022 0854   MCHC 31.3 (L) 09/08/2022  0854   RDW 14.7 09/08/2022 0854   Iron Studies No results found for: "IRON", "TIBC", "FERRITIN", "IRONPCTSAT" Lipid Panel     Component Value Date/Time   CHOL 235 (H) 09/08/2022 0854   TRIG 110 09/08/2022 0854   HDL 90 09/08/2022 0854   CHOLHDL 2.6 09/08/2022 0854   LDLCALC 126 (H) 09/08/2022 0854   Hepatic Function Panel     Component Value Date/Time   PROT 6.6 09/08/2022 0854   ALBUMIN 4.5 09/08/2022 0854   AST 22 09/08/2022 0854   ALT 16 09/08/2022 0854   ALKPHOS 81 09/08/2022 0854   BILITOT 0.4 09/08/2022 0854      Component Value Date/Time   TSH 1.280 09/08/2022 0854   Nutritional Lab Results  Component Value Date   VD25OH 20.9 (L) 09/08/2022    Attestations:   Patient was in the office today and time spent on visit including pre-visit chart review and post-visit care/coordination of care and electronic medical record documentation was 40 minutes. 50% of the time was in face to face counseling of this patient's medical condition(s) and providing education on treatment options to include the first-line treatment of diet and  lifestyle modification.   I, Special Randolm Idol, acting as a Stage manager for [employer], DO., have compiled all relevant documentation for today's office visit on behalf of Thomasene Lot, DO, while in the presence of [employer], DO.  I have reviewed the above documentation for accuracy and completeness, and I agree with the above. Andrea Andrea Bridges, D.O.  The 21st Century Cures Act was signed into law in 2016 which includes the topic of electronic health records.  This provides immediate access to information in MyChart.  This includes consultation notes, operative notes, office notes, lab results and pathology reports.  If you have any questions about what you read please let us know at your next visit so we can discuss your concerns and take corrective action if need be.  We are right here with you.

## 2023-02-09 NOTE — Progress Notes (Signed)
[name], D.O.  ABFM, ABOM Specializing in Clinical Bariatric Medicine  Office located at: [address] [place], [place]  [address]     Assessment and Plan:   Medications Discontinued During This Encounter  Medication Reason   metFORMIN (GLUCOPHAGE) 500 MG tablet    sertraline (ZOLOFT) 50 MG tablet    Vitamin D, Ergocalciferol, (DRISDOL) 1.25 MG (50000 UNIT) CAPS capsule Reorder     Meds ordered this encounter  Medications   Vitamin D, Ergocalciferol, (DRISDOL) 1.25 MG (50000 UNIT) CAPS capsule    Sig: Take 1 capsule (50,000 Units total) by mouth every 7 (seven) days.    Dispense:  4 capsule    Refill:  0   Semaglutide-Weight Management 0.25 MG/0.5ML SOAJ    Sig: Inject 0.25 mg into the skin once a week.    Dispense:  2 mL    Refill:  0   Pt had labs done through work - bring next OV   Insulin resistance Assessment & Plan: Pt endorses that her compliance with Metformin 500 mg bid has been challenging. She often forgets to take the Metformin with food and reports having loose stools when taking it on an empty stomach. Pt reports having some hunger & cravings. Pt inquires about Reginal Lutes today, which she states is covered by her insurance.   We mutually agreed to discontinue the Metformin & initiate Wegovy 0.25 mg once wkly. Patient denies a personal history of pancreatitis;  and denies a family history of medullary thyroid carcinoma or multiple endocrine neoplasia type II.  I informed the patient that the demand for GLP-1-like medications is high and supply is low, therefore the medication can be difficult to obtain.  Strategies to obtain discussed with patient.  Potential risks/ benefits of the medication were explained to patient. Explained that the more she eats "off-plan", the more likely for her to have side effects of the drug.  All questions were answered appropriately and alternative treatment options were discussed; patient wishes to move forward with the  medication at this time.   Vitamin D deficiency Assessment & Plan: Last vitamin D levels were 20.9 on 09/08/22. Pt has been off Rx vitamin D for 1 month or so and agrees to restart. Will refill ERGO today.  Will recheck in 2-3 mos.    BMI 32.0-32.9,adult - current BMI 32.57 Obesity, Beginning BMI 32.89 Assessment & Plan: Since last office visit on 12/02/22 patient's  Muscle mass has increased by 2 lb. Fat mass has increased by 0.2 lb. Total body water has increased by 1.2 lb.  Counseling done on how various foods will affect these numbers and how to maximize success  Total lbs lost to date: - 2 lbs  Total weight loss percentage to date: 0.95%  Meal plan: Begin journaling 1,000 calories & 80+ grams of protein with the Category 1 meal plan   I discussed with pt the potential benefits of tracking the foods they eat and drink. Journaling can help one further understand their eating habits and patterns and it's a great tool to make more conscious meal choices throughout the day.    Behavioral Intervention Additional resources provided today: food journaling log handout, healthy tuna salad recipe  Evidence-based interventions for health behavior change were utilized today including the discussion of self monitoring techniques, problem-solving barriers and SMART goal setting techniques.   Regarding patient's less desirable eating habits and patterns, we employed the technique of small changes.  Pt will specifically work on: Orthoptist  her intake for next visit.    FOLLOW UP: Return in 3-4 wks. She was informed of the importance of frequent follow up visits to maximize her success with intensive lifestyle modifications for her multiple health conditions.  Subjective:   Chief complaint: Obesity Andrea Bridges is here to discuss her progress with her obesity treatment plan. She is on the Category 1 Plan and states she is following her eating plan approximately 25% of the time. She states she is walking on  the treadmill 10-15 minutes, 2 days a week,   Interval History:  Andrea Bridges is here for a follow up office visit. Since last OV, Andrea Bridges has been doing well. Pt has been busy due to work and traveling & therefore endorses not being in control of what she's eating. She purchased a [employer] membership, but has not gone yet. Pt desires to start Reginal Lutes - she states that her insurance covers it.   Review of Systems:  Pertinent positives were addressed with patient today.  Reviewed by clinician on day of visit: allergies, medications, problem list, medical history, surgical history, family history, social history, and previous encounter notes.  Weight Summary and Biometrics   Weight Lost Since Last Visit: 0lb  Weight Gained Since Last Visit: 2lb   Vitals Temp: 98 F (36.7 C) BP: 108/73 Pulse Rate: 81 SpO2: 97 %   Anthropometric Measurements Height: 5\' 7"  (1.702 m) Weight: 208 lb (94.3 kg) BMI (Calculated): 32.57 Weight at Last Visit: 206lb Weight Lost Since Last Visit: 0lb Weight Gained Since Last Visit: 2lb Starting Weight: 210lb Total Weight Loss (lbs): 2 lb (0.907 kg)   Body Composition  Body Fat %: 41.6 % Fat Mass (lbs): 86.6 lbs Muscle Mass (lbs): 115.6 lbs Total Body Water (lbs): 83 lbs Visceral Fat Rating : 11   Other Clinical Data Fasting: no Labs: no Today's Visit #: 7 Starting Date: 09/09/22   Objective:   PHYSICAL EXAM: Blood pressure 108/73, pulse 81, temperature 98 F (36.7 C), height 5\' 7"  (1.702 m), weight 208 lb (94.3 kg), SpO2 97%. Body mass index is 32.58 kg/m.  General: Well Developed, well nourished, and in no acute distress.  HEENT: Normocephalic, atraumatic Skin: Warm and dry, cap RF less 2 sec, good turgor Chest:  Normal excursion, shape, no gross abn Respiratory: speaking in full sentences, no conversational dyspnea NeuroM-Sk: Ambulates w/o assistance, moves * 4 Psych: A and O *3, insight good, mood-full  DIAGNOSTIC DATA  REVIEWED:  BMET    Component Value Date/Time   NA 140 09/08/2022 0854   K 5.0 09/08/2022 0854   CL 100 09/08/2022 0854   CO2 24 09/08/2022 0854   GLUCOSE 88 09/08/2022 0854   BUN 16 09/08/2022 0854   CREATININE 0.83 09/08/2022 0854   CALCIUM 9.7 09/08/2022 0854   Lab Results  Component Value Date   HGBA1C 5.5 09/08/2022   Lab Results  Component Value Date   INSULIN 9.3 09/08/2022   Lab Results  Component Value Date   TSH 1.280 09/08/2022   CBC    Component Value Date/Time   WBC 5.6 09/08/2022 0854   RBC 5.13 09/08/2022 0854   HGB 13.5 09/08/2022 0854   HCT 43.1 09/08/2022 0854   PLT 219 09/08/2022 0854   MCV 84 09/08/2022 0854   MCH 26.3 (L) 09/08/2022 0854   MCHC 31.3 (L) 09/08/2022 0854   RDW 14.7 09/08/2022 0854   Iron Studies No results found for: "IRON", "TIBC", "FERRITIN", "IRONPCTSAT" Lipid Panel     Component  Value Date/Time   CHOL 235 (H) 09/08/2022 0854   TRIG 110 09/08/2022 0854   HDL 90 09/08/2022 0854   CHOLHDL 2.6 09/08/2022 0854   LDLCALC 126 (H) 09/08/2022 0854   Hepatic Function Panel     Component Value Date/Time   PROT 6.6 09/08/2022 0854   ALBUMIN 4.5 09/08/2022 0854   AST 22 09/08/2022 0854   ALT 16 09/08/2022 0854   ALKPHOS 81 09/08/2022 0854   BILITOT 0.4 09/08/2022 0854      Component Value Date/Time   TSH 1.280 09/08/2022 0854   Nutritional Lab Results  Component Value Date   VD25OH 20.9 (L) 09/08/2022    Attestations:   Patient was in the office today and time spent on visit including pre-visit chart review and post-visit care/coordination of care and electronic medical record documentation was 40 minutes. 50% of the time was in face to face counseling of this patient's medical condition(s) and providing education on treatment options to include the first-line treatment of diet and lifestyle modification.   I, Special Randolm Idol, acting as a Stage manager for [employer], DO., have compiled all relevant documentation  for today's office visit on behalf of Thomasene Lot, DO, while in the presence of [employer], DO.  I have reviewed the above documentation for accuracy and completeness, and I agree with the above. [name], D.O.  The 21st Century Cures Act was signed into law in 2016 which includes the topic of electronic health records.  This provides immediate access to information in MyChart.  This includes consultation notes, operative notes, office notes, lab results and pathology reports.  If you have any questions about what you read please let us know at your next visit so we can discuss your concerns and take corrective action if need be.  We are right here with you.

## 2023-03-01 ENCOUNTER — Other Ambulatory Visit (INDEPENDENT_AMBULATORY_CARE_PROVIDER_SITE_OTHER): Payer: Self-pay | Admitting: Family Medicine

## 2023-03-01 DIAGNOSIS — E559 Vitamin D deficiency, unspecified: Secondary | ICD-10-CM

## 2023-03-02 ENCOUNTER — Ambulatory Visit (INDEPENDENT_AMBULATORY_CARE_PROVIDER_SITE_OTHER): Payer: No Typology Code available for payment source | Admitting: Family Medicine

## 2023-03-02 NOTE — Progress Notes (Incomplete)
[name], D.O.  ABFM, ABOM Specializing in Clinical Bariatric Medicine  Office located at: [address] [place], [place]  [address]     Assessment and Plan:   No orders of the defined types were placed in this encounter.   There are no discontinued medications.   No orders of the defined types were placed in this encounter.    *** There are no diagnoses linked to this encounter.     TREATMENT PLAN FOR OBESITY:  Assessment:  Andrea Bridges is here to discuss her progress with her obesity treatment plan along with follow-up of her obesity related diagnoses. See Medical Weight Management Flowsheet for complete bioelectrical impedance results.  Condition is {docourse:29403:::1}. Biometric data collected today, was reviewed with patient.   Since last office visit on 02/08/2023 patient's  Muscle mass has {DID:29233} by ***lb. Fat mass has {DID:29233} by ***lb. Total body water has {DID:29233} by ***lb.  Counseling done on how various foods will affect these numbers and how to maximize success  Total lbs lost to date: *** Total weight loss percentage to date: ***   Plan:  Andrea Bridges is currently in the action stage of change. As such, her goal is to continue her weight management plan. Andrea Bridges will work on healthier eating habits and try to follow the {mealplan:29239} best they can.   Behavioral Intervention Additional resources provided today: {weightresources:29185} Evidence-based interventions for health behavior change were utilized today including the discussion of self monitoring techniques, problem-solving barriers and SMART goal setting techniques.   Regarding patient's less desirable eating habits and patterns, we employed the technique of small changes.  Pt will specifically work on: *** for next visit.     She has agreed to {EMEXERCISE:28847::"Think about enjoyable ways to increase daily physical activity and overcoming barriers to exercise","Increase physical  activity in their day and reduce sedentary time (increase NEAT)."}   FOLLOW UP: No follow-ups on file.  She was informed of the importance of frequent follow up visits to maximize her success with intensive lifestyle modifications for her multiple health conditions.  Subjective:   Chief complaint: Obesity Andrea Bridges is here to discuss her progress with her obesity treatment plan. She is on the keeping a food journal and adhering to recommended goals of 1000 calories and 80+ protein and states she is following her eating plan approximately ***% of the time. She states she is exercising *** minutes *** days per week.  Interval History:  Andrea Bridges is here for a follow up office visit.     Since last office visit:  ***    We reviewed her meal plan and all questions were answered.    Pharmacotherapy for weight loss: She is currently taking Wegovy for medical weight loss.  Denies side effects.    Review of Systems:  Pertinent positives were addressed with patient today.  Reviewed by clinician on day of visit: allergies, medications, problem list, medical history, surgical history, family history, social history, and previous encounter notes.  Weight Summary and Biometrics   No data recorded No data recorded  No data recorded No data recorded No data recorded No data recorded   Objective:   PHYSICAL EXAM: There were no vitals taken for this visit. There is no height or weight on file to calculate BMI.  General: Well Developed, well nourished, and in no acute distress.  HEENT: Normocephalic, atraumatic Skin: Warm and dry, cap RF less 2 sec, good turgor Chest:  Normal excursion, shape, no gross abn Respiratory:  speaking in full sentences, no conversational dyspnea NeuroM-Sk: Ambulates w/o assistance, moves * 4 Psych: A and O *3, insight good, mood-full  DIAGNOSTIC DATA REVIEWED:  BMET    Component Value Date/Time   NA 140 09/08/2022 0854   K 5.0 09/08/2022 0854   CL 100  09/08/2022 0854   CO2 24 09/08/2022 0854   GLUCOSE 88 09/08/2022 0854   BUN 16 09/08/2022 0854   CREATININE 0.83 09/08/2022 0854   CALCIUM 9.7 09/08/2022 0854   Lab Results  Component Value Date   HGBA1C 5.5 09/08/2022   Lab Results  Component Value Date   INSULIN 9.3 09/08/2022   Lab Results  Component Value Date   TSH 1.280 09/08/2022   CBC    Component Value Date/Time   WBC 5.6 09/08/2022 0854   RBC 5.13 09/08/2022 0854   HGB 13.5 09/08/2022 0854   HCT 43.1 09/08/2022 0854   PLT 219 09/08/2022 0854   MCV 84 09/08/2022 0854   MCH 26.3 (L) 09/08/2022 0854   MCHC 31.3 (L) 09/08/2022 0854   RDW 14.7 09/08/2022 0854   Iron Studies No results found for: "IRON", "TIBC", "FERRITIN", "IRONPCTSAT" Lipid Panel     Component Value Date/Time   CHOL 235 (H) 09/08/2022 0854   TRIG 110 09/08/2022 0854   HDL 90 09/08/2022 0854   CHOLHDL 2.6 09/08/2022 0854   LDLCALC 126 (H) 09/08/2022 0854   Hepatic Function Panel     Component Value Date/Time   PROT 6.6 09/08/2022 0854   ALBUMIN 4.5 09/08/2022 0854   AST 22 09/08/2022 0854   ALT 16 09/08/2022 0854   ALKPHOS 81 09/08/2022 0854   BILITOT 0.4 09/08/2022 0854      Component Value Date/Time   TSH 1.280 09/08/2022 0854   Nutritional Lab Results  Component Value Date   VD25OH 20.9 (L) 09/08/2022    Attestations:   This encounter took 40 total minutes of time including any pre-visit and post-visit time spent on this date of service, including taking a thorough history, reviewing any labs and/or imaging, reviewing prior notes, as well as documenting in the electronic health record on the date of service. Over 50% of that time was in direct face-to-face counseling and coordinating care for the patient today  I, Clinical biochemist, acting as a Stage manager for [employer], DO., have compiled all relevant documentation for today's office visit on behalf of Thomasene Lot, DO, while in the presence of [employer],  DO.  I have reviewed the above documentation for accuracy and completeness, and I agree with the above. [name], D.O.  The 21st Century Cures Act was signed into law in 2016 which includes the topic of electronic health records.  This provides immediate access to information in MyChart.  This includes consultation notes, operative notes, office notes, lab results and pathology reports.  If you have any questions about what you read please let us know at your next visit so we can discuss your concerns and take corrective action if need be.  We are right here with you.

## 2023-03-03 ENCOUNTER — Encounter (INDEPENDENT_AMBULATORY_CARE_PROVIDER_SITE_OTHER): Payer: Self-pay | Admitting: Family Medicine

## 2023-03-03 ENCOUNTER — Ambulatory Visit (INDEPENDENT_AMBULATORY_CARE_PROVIDER_SITE_OTHER): Payer: No Typology Code available for payment source | Admitting: Family Medicine

## 2023-03-03 VITALS — BP 98/64 | HR 69 | Temp 98.6°F | Ht 67.0 in | Wt 205.0 lb

## 2023-03-03 DIAGNOSIS — E669 Obesity, unspecified: Secondary | ICD-10-CM

## 2023-03-03 DIAGNOSIS — E559 Vitamin D deficiency, unspecified: Secondary | ICD-10-CM | POA: Diagnosis not present

## 2023-03-03 DIAGNOSIS — Z6832 Body mass index (BMI) 32.0-32.9, adult: Secondary | ICD-10-CM

## 2023-03-03 DIAGNOSIS — E88819 Insulin resistance, unspecified: Secondary | ICD-10-CM | POA: Diagnosis not present

## 2023-03-03 DIAGNOSIS — E7800 Pure hypercholesterolemia, unspecified: Secondary | ICD-10-CM

## 2023-03-03 MED ORDER — WEGOVY 0.5 MG/0.5ML ~~LOC~~ SOAJ
0.5000 mg | SUBCUTANEOUS | 0 refills | Status: DC
Start: 2023-03-03 — End: 2023-03-10

## 2023-03-03 MED ORDER — VITAMIN D (ERGOCALCIFEROL) 1.25 MG (50000 UNIT) PO CAPS
50000.0000 [IU] | ORAL_CAPSULE | ORAL | 0 refills | Status: DC
Start: 2023-03-03 — End: 2023-03-31

## 2023-03-03 NOTE — Progress Notes (Signed)
Andrea Bridges, D.O.  ABFM, ABOM Specializing in Clinical Bariatric Medicine  Office located at: [address] [place], [place]  [address]   Assessment and Plan:   Medications Discontinued During This Encounter  Medication Reason   Semaglutide-Weight Management 0.25 MG/0.5ML SOAJ Dose change   Vitamin D, Ergocalciferol, (DRISDOL) 1.25 MG (50000 UNIT) CAPS capsule Reorder   Meds ordered this encounter  Medications   Vitamin D, Ergocalciferol, (DRISDOL) 1.25 MG (50000 UNIT) CAPS capsule    Sig: Take 1 capsule (50,000 Units total) by mouth every 7 (seven) days.    Dispense:  4 capsule    Refill:  0   Semaglutide-Weight Management (WEGOVY) 0.5 MG/0.5ML SOAJ    Sig: Inject 0.5 mg into the skin once a week.    Dispense:  2 mL    Refill:  0    Next OV recheck fasting labs (vitamin D, CMP, fasting insulin, etc)  Insulin resistance Assessment & Plan: Most recent fasting insulin of 9.3. Macle currently taking Wegovy 0.25 mg once a week without any adverse GI effects. Pt endorses eating more protein, having less sweet cravings, and experiencing earlier satiety. Has 1 week supply left  of Wegovy. Pt agreed to journal intake and will continue with weight loss therapy. Additionally, to further aid with weight loss efforts, we agreed to increase Wegovy to 0.5 mg.    Pure hypercholesterolemia Assessment & Plan: Pt had labs as part of biometric screening at work on 01/06/23. LDL has increased from 126 to 158. HDL has improved from 90 to 102. TRIG have improved from 110 to 65. Continue with our prudent nutritional plan that is low in saturated and trans fats, and low in fatty carbs to improve these numbers. We will continue routine screening as patient continues to achieve health goals along their weight loss journey   Vitamin D deficiency Assessment & Plan: Most recent vit D of 20.9 on 09/08/22. No issues with ergocalciferol 50,000 units every 7 days a week. Continue with weight loss  efforts. Will refill ERGO today; maintain at current dose.    TREATMENT PLAN FOR OBESITY: BMI 32.0-32.9,adult - current BMI 32.1 Obesity, Beginning BMI 32.89 Assessment & Plan: Andrea Bridges is here to discuss her progress with her obesity treatment plan along with follow-up of her obesity related diagnoses. See Medical Weight Management Flowsheet for complete bioelectrical impedance results.  Since last office visit on 02/08/23 patient's muscle mass has decreased by 1 lb. Fat mass has decreased by 2 lb. Total body water has decreased by 3.4 lb.  Counseling done on how various foods will affect these numbers and how to maximize success  Total lbs lost to date: 5 lbs  Total weight loss percentage to date: 2.38%   No change to meal plan - see Subjective  Behavioral Intervention Additional resources provided today: n/a Evidence-based interventions for health behavior change were utilized today including the discussion of self monitoring techniques, problem-solving barriers and SMART goal setting techniques.   Regarding patient's less desirable eating habits and patterns, we employed the technique of small changes.  Pt will specifically work on: journaling her intake/bringing in food log  FOLLOW UP: Return 03/17/23. She was informed of the importance of frequent follow up visits to maximize her success with intensive lifestyle modifications for her multiple health conditions.  Subjective:   Chief complaint: Obesity Andrea Bridges is here to discuss her progress with her obesity treatment plan. She is journaling 1,000 calories & 80+ grams of protein with the  Category 1 meal plan and states she is following her eating plan approximately 60% of the time. She states she is exercising 20-30 minutes, 2 days per week.  Interval History:  Andrea Bridges is here for a follow up office visit. Since last OV, [name] has been doing well. Pt has no complaints with meal plan. Has not been journaling her intake. Hunger and  cravings are pretty well controlled. Reports purposely drinking more water. Pt very rarely eats out. Pt has upcoming mountain.  Pharmacotherapy for weight loss: She is currently taking  Wegovy 0.25 mg once a week  for medical weight loss.  Denies side effects.    Review of Systems:  Pertinent positives were addressed with patient today.  Reviewed by clinician on day of visit: allergies, medications, problem list, medical history, surgical history, family history, social history, and previous encounter notes.  Weight Summary and Biometrics   Weight Lost Since Last Visit: 3lb  Weight Gained Since Last Visit: 0lb   Vitals Temp: 98.6 F (37 C) BP: 98/64 Pulse Rate: 69 SpO2: 99 %   Anthropometric Measurements Height: 5\' 7"  (1.702 m) Weight: 205 lb (93 kg) BMI (Calculated): 32.1 Weight at Last Visit: 208lb Weight Lost Since Last Visit: 3lb Weight Gained Since Last Visit: 0lb Starting Weight: 210lb Total Weight Loss (lbs): 5 lb (2.268 kg)   Body Composition  Body Fat %: 41.2 % Fat Mass (lbs): 84.6 lbs Muscle Mass (lbs): 114.6 lbs Total Body Water (lbs): 79.6 lbs Visceral Fat Rating : 11   Other Clinical Data Fasting: no Labs: no Today's Visit #: 8 Starting Date: 09/09/22   Objective:   PHYSICAL EXAM: Blood pressure 98/64, pulse 69, temperature 98.6 F (37 C), height 5\' 7"  (1.702 m), weight 205 lb (93 kg), SpO2 99%. Body mass index is 32.11 kg/m.  General: Well Developed, well nourished, and in no acute distress.  HEENT: Normocephalic, atraumatic Skin: Warm and dry, cap RF less 2 sec, good turgor Chest:  Normal excursion, shape, no gross abn Respiratory: speaking in full sentences, no conversational dyspnea NeuroM-Sk: Ambulates w/o assistance, moves * 4 Psych: A and O *3, insight good, mood-full  DIAGNOSTIC DATA REVIEWED:  BMET    Component Value Date/Time   NA 140 09/08/2022 0854   K 5.0 09/08/2022 0854   CL 100 09/08/2022 0854   CO2 24 09/08/2022  0854   GLUCOSE 88 09/08/2022 0854   BUN 16 09/08/2022 0854   CREATININE 0.83 09/08/2022 0854   CALCIUM 9.7 09/08/2022 0854   Lab Results  Component Value Date   HGBA1C 5.2 01/06/2023   HGBA1C 5.5 09/08/2022   Lab Results  Component Value Date   INSULIN 9.3 09/08/2022   Lab Results  Component Value Date   TSH 1.280 09/08/2022   CBC    Component Value Date/Time   WBC 5.6 09/08/2022 0854   RBC 5.13 09/08/2022 0854   HGB 13.5 09/08/2022 0854   HCT 43.1 09/08/2022 0854   PLT 219 09/08/2022 0854   MCV 84 09/08/2022 0854   MCH 26.3 (L) 09/08/2022 0854   MCHC 31.3 (L) 09/08/2022 0854   RDW 14.7 09/08/2022 0854   Iron Studies No results found for: "IRON", "TIBC", "FERRITIN", "IRONPCTSAT" Lipid Panel     Component Value Date/Time   CHOL 273 (A) 01/06/2023 0000   CHOL 235 (H) 09/08/2022 0854   TRIG 65 01/06/2023 0000   HDL 102 (A) 01/06/2023 0000   HDL 90 09/08/2022 0854   CHOLHDL 2.6 09/08/2022 0854  LDLCALC 158 01/06/2023 0000   LDLCALC 126 (H) 09/08/2022 0854   Hepatic Function Panel     Component Value Date/Time   PROT 6.6 09/08/2022 0854   ALBUMIN 4.5 09/08/2022 0854   AST 22 09/08/2022 0854   ALT 16 09/08/2022 0854   ALKPHOS 81 09/08/2022 0854   BILITOT 0.4 09/08/2022 0854      Component Value Date/Time   TSH 1.280 09/08/2022 0854   Nutritional Lab Results  Component Value Date   VD25OH 20.9 (L) 09/08/2022    Attestations:   I, Special Puri, acting as a Stage manager for Thomasene Lot, DO., have compiled all relevant documentation for today's office visit on behalf of Thomasene Lot, DO, while in the presence of [employer], DO.  I have reviewed the above documentation for accuracy and completeness, and I agree with the above. Andrea Bridges, D.O.  The 21st Century Cures Act was signed into law in 2016 which includes the topic of electronic health records.  This provides immediate access to information in MyChart.  This includes  consultation notes, operative notes, office notes, lab results and pathology reports.  If you have any questions about what you read please let us know at your next visit so we can discuss your concerns and take corrective action if need be.  We are right here with you.

## 2023-03-09 ENCOUNTER — Telehealth (INDEPENDENT_AMBULATORY_CARE_PROVIDER_SITE_OTHER): Payer: Self-pay | Admitting: Family Medicine

## 2023-03-09 NOTE — Telephone Encounter (Signed)
Pt states that Wegovy 0.5 is not available at her pharmacy. Pt wants the medication transferred to a different pharmacy that has the medicaton. Please call pt at [phone].

## 2023-03-09 NOTE — Telephone Encounter (Signed)
Pt called and left message for patient to contact pharmacy that has medication in stock and have them do a pharmacy to pharmacy transfer.

## 2023-03-10 ENCOUNTER — Other Ambulatory Visit (HOSPITAL_COMMUNITY): Payer: Self-pay

## 2023-03-10 MED ORDER — WEGOVY 0.5 MG/0.5ML ~~LOC~~ SOAJ
0.5000 mg | SUBCUTANEOUS | 0 refills | Status: DC
  Filled 2023-03-10: qty 2, 28d supply, fill #0

## 2023-03-10 NOTE — Addendum Note (Signed)
Addended by: Sande Brothers on: 03/10/2023 07:48 AM   Modules accepted: Orders

## 2023-03-14 ENCOUNTER — Other Ambulatory Visit (HOSPITAL_COMMUNITY): Payer: Self-pay

## 2023-03-15 ENCOUNTER — Other Ambulatory Visit: Payer: Self-pay

## 2023-03-17 ENCOUNTER — Ambulatory Visit (INDEPENDENT_AMBULATORY_CARE_PROVIDER_SITE_OTHER): Payer: No Typology Code available for payment source | Admitting: Family Medicine

## 2023-03-17 ENCOUNTER — Encounter (INDEPENDENT_AMBULATORY_CARE_PROVIDER_SITE_OTHER): Payer: Self-pay | Admitting: Family Medicine

## 2023-03-17 VITALS — BP 113/77 | HR 68 | Temp 97.8°F | Ht 67.0 in | Wt 206.0 lb

## 2023-03-17 DIAGNOSIS — Z6832 Body mass index (BMI) 32.0-32.9, adult: Secondary | ICD-10-CM | POA: Diagnosis not present

## 2023-03-17 DIAGNOSIS — E559 Vitamin D deficiency, unspecified: Secondary | ICD-10-CM

## 2023-03-17 DIAGNOSIS — E669 Obesity, unspecified: Secondary | ICD-10-CM | POA: Diagnosis not present

## 2023-03-17 DIAGNOSIS — E88819 Insulin resistance, unspecified: Secondary | ICD-10-CM

## 2023-03-17 NOTE — Progress Notes (Signed)
Andrea Bridges, D.O.  ABFM, ABOM Specializing in Clinical Bariatric Medicine  Office located at: [address] [place], [place]  [address]   Assessment and Plan:   FOR THE DISEASE OF OBESITY:  Since last office visit on 03/03/23 patient's  Muscle mass has increased by 1.4 lb. Fat mass has not changed. Total body water has increased by 0.4 lb.  Counseling done on how various foods will affect these numbers and how to maximize success  Total lbs lost to date: 4 lbs  Total weight loss percentage to date: 1.90%    Recommended Dietary Goals Darlisha is currently in the action stage of change. As such, her goal is to continue weight management plan.  She has agreed to: continue current plan   Behavioral Intervention We discussed the following Behavioral Modification Strategies today:  increasing adherence to journaling and tracking calories .  Additional resources provided today: None  Evidence-based interventions for health behavior change were utilized today including the discussion of self monitoring techniques, problem-solving barriers and SMART goal setting techniques.   Regarding patient's less desirable eating habits and patterns, we employed the technique of small changes.   Pt will specifically work on: journaling her intake/bringing in food log for next visit.    Recommended Physical Activity Goals Sejla has been advised to work up to 150 minutes of moderate intensity aerobic activity a week and strengthening exercises 2-3 times per week for cardiovascular health, weight loss maintenance and preservation of muscle mass.   She has agreed to :  Continue current level of physical activity    Pharmacotherapy We discussed various medication options to help Veleda with her weight loss efforts and we both agreed to : continue current anti-obesity medication regimen   FOR ASSOCIATED CONDITIONS ADDRESSED TODAY:  Insulin resistance Assessment & Plan: Most recent  Hemoglobin A1c of 5.2 on 01/06/23. Most recent fasting insulin of 9.3 on 09/08/22. Bayyinah currently taking Wegovy 0.5 mg once a week. Reports having slight indigestion; this has been manageable with Tums. Reports that hunger and cravings are well controlled.   Continue with Wegovy at current dose. Work on journaling intake and increase exercise as tolerated. Will recheck labs today.   Orders: -     Insulin, random -     Hemoglobin A1c -     Comprehensive metabolic panel   Vitamin D deficiency Assessment & Plan: Most recent vit D of 20.9 on 09/08/22. No issues with ergocalciferol 50,000 units every 7 days a week.  Continue with weight loss efforts and high dose rx vitamin D. Will recheck labs today.   Orders: -     VITAMIN D 25 Hydroxy (Vit-D Deficiency, Fractures)   BMI 32.0-32.9,adult - current BMI 32.26 Obesity, Beginning BMI 32.89 Assessment & Plan: See obesity treatment plan.   FOLLOW UP: Return 03/31/23. She was informed of the importance of frequent follow up visits to maximize her success with intensive lifestyle modifications for her multiple health conditions.  Alizey Noren is aware that we will review all of her lab results at our next visit.  She is aware that if anything is critical/ life threatening with the results, we will be contacting her via MyChart prior to the office visit to discuss management.    Subjective:   Chief complaint: Obesity Leoni is here to discuss her progress with her obesity treatment plan. She is journaling 1,000 calories & 80+ grams of protein with the Category 1 meal plan and states she is following her  eating plan approximately 50% of the time. She states she is walking 20 minutes 2 days per week.  Interval History:  Maat Kafer is here for a follow up office visit. Since last OV, Regina is up 1 lbs. Reports that she has been more purposeful with protein intake. Has not been journaling intake. Hunger and cravings appear to be well controlled.    Barriers identified: none.   Pharmacotherapy for weight loss: She is currently taking  Wegovy 0.5 mg once a week .   Review of Systems:  Pertinent positives were addressed with patient today.  Reviewed by clinician on day of visit: allergies, medications, problem list, medical history, surgical history, family history, social history, and previous encounter notes.  Weight Summary and Biometrics   Weight Lost Since Last Visit: 0lb  Weight Gained Since Last Visit: 1lb   Vitals Temp: 97.8 F (36.6 C) BP: 113/77 Pulse Rate: 68 SpO2: 99 %   Anthropometric Measurements Height: 5\' 7"  (1.702 m) Weight: 206 lb (93.4 kg) BMI (Calculated): 32.26 Weight at Last Visit: 205lb Weight Lost Since Last Visit: 0lb Weight Gained Since Last Visit: 1lb Starting Weight: 210lb Total Weight Loss (lbs): 4 lb (1.814 kg)   Body Composition  Body Fat %: 40.9 % Fat Mass (lbs): 84.6 lbs Muscle Mass (lbs): 116 lbs Total Body Water (lbs): 80 lbs Visceral Fat Rating : 11   Other Clinical Data Fasting: yes Labs: yes Today's Visit #: 9 Starting Date: 09/09/22   Objective:   PHYSICAL EXAM: Blood pressure 113/77, pulse 68, temperature 97.8 F (36.6 C), height 5\' 7"  (1.702 m), weight 206 lb (93.4 kg), SpO2 99%. Body mass index is 32.26 kg/m.  General: she is overweight, cooperative and in no acute distress. PSYCH: Has normal mood, affect and thought process.   HEENT: EOMI, sclerae are anicteric. Lungs: Normal breathing effort, no conversational dyspnea. Extremities: Moves * 4 Neurologic: A and O * 3, good insight  DIAGNOSTIC DATA REVIEWED: BMET    Component Value Date/Time   NA 140 09/08/2022 0854   K 5.0 09/08/2022 0854   CL 100 09/08/2022 0854   CO2 24 09/08/2022 0854   GLUCOSE 88 09/08/2022 0854   BUN 16 09/08/2022 0854   CREATININE 0.83 09/08/2022 0854   CALCIUM 9.7 09/08/2022 0854   Lab Results  Component Value Date   HGBA1C 5.2 01/06/2023   HGBA1C 5.5 09/08/2022    Lab Results  Component Value Date   INSULIN 9.3 09/08/2022   Lab Results  Component Value Date   TSH 1.280 09/08/2022   CBC    Component Value Date/Time   WBC 5.6 09/08/2022 0854   RBC 5.13 09/08/2022 0854   HGB 13.5 09/08/2022 0854   HCT 43.1 09/08/2022 0854   PLT 219 09/08/2022 0854   MCV 84 09/08/2022 0854   MCH 26.3 (L) 09/08/2022 0854   MCHC 31.3 (L) 09/08/2022 0854   RDW 14.7 09/08/2022 0854   Iron Studies No results found for: "IRON", "TIBC", "FERRITIN", "IRONPCTSAT" Lipid Panel     Component Value Date/Time   CHOL 273 (A) 01/06/2023 0000   CHOL 235 (H) 09/08/2022 0854   TRIG 65 01/06/2023 0000   HDL 102 (A) 01/06/2023 0000   HDL 90 09/08/2022 0854   CHOLHDL 2.6 09/08/2022 0854   LDLCALC 158 01/06/2023 0000   LDLCALC 126 (H) 09/08/2022 0854   Hepatic Function Panel     Component Value Date/Time   PROT 6.6 09/08/2022 0854   ALBUMIN 4.5 09/08/2022 0854  AST 22 09/08/2022 0854   ALT 16 09/08/2022 0854   ALKPHOS 81 09/08/2022 0854   BILITOT 0.4 09/08/2022 0854      Component Value Date/Time   TSH 1.280 09/08/2022 0854   Nutritional Lab Results  Component Value Date   VD25OH 20.9 (L) 09/08/2022    Attestations:   I, Special Puri, acting as a Stage manager for [employer], DO., have compiled all relevant documentation for today's office visit on behalf of Thomasene Lot, DO, while in the presence of [employer], DO.  I have reviewed the above documentation for accuracy and completeness, and I agree with the above. Andrea Bridges, D.O.  The 21st Century Cures Act was signed into law in 2016 which includes the topic of electronic health records.  This provides immediate access to information in MyChart.  This includes consultation notes, operative notes, office notes, lab results and pathology reports.  If you have any questions about what you read please let us know at your next visit so we can discuss your concerns and take corrective  action if need be.  We are right here with you.

## 2023-03-18 LAB — COMPREHENSIVE METABOLIC PANEL
ALT: 14 [IU]/L (ref 0–32)
AST: 19 [IU]/L (ref 0–40)
Albumin: 4.6 g/dL (ref 3.9–4.9)
Alkaline Phosphatase: 82 [IU]/L (ref 44–121)
BUN/Creatinine Ratio: 23 (ref 12–28)
BUN: 21 mg/dL (ref 8–27)
Bilirubin Total: 0.3 mg/dL (ref 0.0–1.2)
CO2: 24 mmol/L (ref 20–29)
Calcium: 10.2 mg/dL (ref 8.7–10.3)
Chloride: 101 mmol/L (ref 96–106)
Creatinine, Ser: 0.9 mg/dL (ref 0.57–1.00)
Globulin, Total: 2.4 g/dL (ref 1.5–4.5)
Glucose: 84 mg/dL (ref 70–99)
Potassium: 5 mmol/L (ref 3.5–5.2)
Sodium: 139 mmol/L (ref 134–144)
Total Protein: 7 g/dL (ref 6.0–8.5)
eGFR: 72 mL/min/{1.73_m2} (ref >59)

## 2023-03-18 LAB — VITAMIN D 25 HYDROXY (VIT D DEFICIENCY, FRACTURES): Vit D, 25-Hydroxy: 28 ng/mL — ABNORMAL LOW (ref 30.0–100.0)

## 2023-03-18 LAB — HEMOGLOBIN A1C
Est. average glucose Bld gHb Est-mCnc: 114 mg/dL
Hgb A1c MFr Bld: 5.6 % (ref 4.8–5.6)

## 2023-03-18 LAB — INSULIN, RANDOM: INSULIN: 11 u[IU]/mL (ref 2.6–24.9)

## 2023-03-19 ENCOUNTER — Other Ambulatory Visit (INDEPENDENT_AMBULATORY_CARE_PROVIDER_SITE_OTHER): Payer: Self-pay | Admitting: Family Medicine

## 2023-03-19 DIAGNOSIS — E559 Vitamin D deficiency, unspecified: Secondary | ICD-10-CM

## 2023-03-31 ENCOUNTER — Ambulatory Visit (INDEPENDENT_AMBULATORY_CARE_PROVIDER_SITE_OTHER): Payer: No Typology Code available for payment source | Admitting: Family Medicine

## 2023-03-31 ENCOUNTER — Encounter (INDEPENDENT_AMBULATORY_CARE_PROVIDER_SITE_OTHER): Payer: Self-pay | Admitting: Family Medicine

## 2023-03-31 ENCOUNTER — Other Ambulatory Visit (HOSPITAL_COMMUNITY): Payer: Self-pay

## 2023-03-31 VITALS — BP 115/76 | HR 83 | Temp 97.7°F | Ht 67.0 in | Wt 205.0 lb

## 2023-03-31 DIAGNOSIS — E88819 Insulin resistance, unspecified: Secondary | ICD-10-CM

## 2023-03-31 DIAGNOSIS — Z6832 Body mass index (BMI) 32.0-32.9, adult: Secondary | ICD-10-CM | POA: Diagnosis not present

## 2023-03-31 DIAGNOSIS — E559 Vitamin D deficiency, unspecified: Secondary | ICD-10-CM

## 2023-03-31 DIAGNOSIS — E669 Obesity, unspecified: Secondary | ICD-10-CM | POA: Diagnosis not present

## 2023-03-31 MED ORDER — WEGOVY 1 MG/0.5ML ~~LOC~~ SOAJ
1.0000 mg | SUBCUTANEOUS | 0 refills | Status: DC
  Filled 2023-03-31: qty 2, 28d supply, fill #0

## 2023-03-31 MED ORDER — VITAMIN D (ERGOCALCIFEROL) 1.25 MG (50000 UNIT) PO CAPS
50000.0000 [IU] | ORAL_CAPSULE | ORAL | 0 refills | Status: DC
  Filled 2023-03-31 – 2023-04-05 (×2): qty 4, 28d supply, fill #0

## 2023-03-31 NOTE — Progress Notes (Signed)
Andrea Bridges, D.O.  ABFM, ABOM Specializing in Clinical Bariatric Medicine  Office located at: [address] [place], [place]  [address]   Assessment and Plan:   FOR THE DISEASE OF OBESITY: Since last office visit on 03/17/23 patient's muscle mass has decreased by 1.4 lb. Fat mass has not changed. Total body water has increased by 2.6 lb.  Counseling done on how various foods will affect these numbers and how to maximize success  Total lbs lost to date: 5 lbs  Total weight loss percentage to date: 2.38%    Recommended Dietary Goals Andrea Bridges is currently in the action stage of change. As such, her goal is to continue weight management plan.  She has agreed to: continue current plan   Behavioral Intervention We discussed the following today: increasing lean protein intake to established goals and continue to work on maintaining a reduced calorie state, getting the recommended amount of protein, incorporating whole foods, making healthy choices, staying well hydrated and practicing mindfulness when eating.  Additional resources provided today: Handout on holiday eating strategies  Evidence-based interventions for health behavior change were utilized today including the discussion of self monitoring techniques, problem-solving barriers and SMART goal setting techniques.   Regarding patient's less desirable eating habits and patterns, we employed the technique of small changes.   Pt will specifically work on: n/a    Recommended Physical Activity Goals [name] has been advised to work up to 150 minutes of moderate intensity aerobic activity a week and strengthening exercises 2-3 times per week for cardiovascular health, weight loss maintenance and preservation of muscle mass.   She has agreed to : Continue current level of physical activity    Pharmacotherapy We both agreed to : increase Wegovy to 1 mg once a week.    FOR ASSOCIATED CONDITIONS ADDRESSED TODAY:  BMI  32.0-32.9,adult - current BMI 32.1 Obesity, Beginning BMI 32.89 Assessment & Plan: Andrea Bridges is currently on Wegovy 0.5 mg once a week - pt tolerating well, denies any N/V/D. She reports noticing a waning effect and increased hunger towards the end of the week.   Will increase Wegovy to 1 mg once a week. Explained that the more she eats "off-plan", the more likely for her to have side effects of the drug.  Continue with weight loss therapy via reduced calorie nutritional plan.   Orders:  -     Wegovy; Inject 1 mg into the skin once a week.  Dispense: 2 mL; Refill: 0   Insulin resistance Assessment & Plan: Lab Results  Component Value Date   HGBA1C 5.6 03/17/2023   HGBA1C 5.2 01/06/2023   HGBA1C 5.5 09/08/2022   INSULIN 11.0 03/17/2023   INSULIN 9.3 09/08/2022    Lab Results  Component Value Date   CREATININE 0.90 03/17/2023   BUN 21 03/17/2023   NA 139 03/17/2023   K 5.0 03/17/2023   CL 101 03/17/2023   CO2 24 03/17/2023      Component Value Date/Time   PROT 7.0 03/17/2023 0936   ALBUMIN 4.6 03/17/2023 0936   AST 19 03/17/2023 0936   ALT 14 03/17/2023 0936   ALKPHOS 82 03/17/2023 0936   BILITOT 0.3 03/17/2023 0936    Hemoglobin A1c has increased from 5.2 to 5.6. Fasting insulin levels have increased from 9.3 to 11.0. Kidney function, electrolytes, and liver enzymes are within recommended limits.   Andrea Bridges will continue to work on weight loss, exercise, via their meal plan we devised to help decrease the  risk of progressing to prediabetes.    Vitamin D deficiency Assessment & Plan: Lab Results  Component Value Date   VD25OH 28.0 (L) 03/17/2023   VD25OH 20.9 (L) 09/08/2022   3 weeks before 11/07 labs, pt started consistently taking her rx high dose vitamin D.  Prior to this, she admits to being forgetful with taking her ERGO. This likely explains why her vit D has not increased as much as anticipated. She will continue consistently taking ERGO and we'll recheck in 3 months.    Orders: -     Vitamin D (Ergocalciferol); Take 1 capsule (50,000 Units total) by mouth every 7 (seven) days.  Dispense: 4 capsule; Refill: 0   FOLLOW UP: Return 04/14/23. She was informed of the importance of frequent follow up visits to maximize her success with intensive lifestyle modifications for her multiple health conditions.  Subjective:   Chief complaint: Obesity Andrea Bridges is here to discuss her progress with her obesity treatment plan. She is journaling 1,000 calories & 80+ grams of protein with the Category 1 meal plan as a guide and states she is following her eating plan approximately 65% of the time. She states she is walking 30 minutes 2 days per week.  Interval History:  Andrea Bridges is here for a follow up office visit. Since last OV, Andrea Bridges is down 1 lb.  Andrea Bridges endorses being stressed due to work since open enrollment period has started. Food wise, her biggest challenge is getting in her protein at dinner. She also endorses having extra chocolate.    Barriers identified: none  Pharmacotherapy for weight loss: She is currently taking  Wegovy 0.5 mg once a week .   Review of Systems:  Pertinent positives were addressed with patient today.  Reviewed by clinician on day of visit: allergies, medications, problem list, medical history, surgical history, family history, social history, and previous encounter notes.  Weight Summary and Biometrics   Weight Lost Since Last Visit: 1lb  Weight Gained Since Last Visit: 0lb   Vitals Temp: 97.7 F (36.5 C) BP: 115/76 Pulse Rate: 83 SpO2: 98 %   Anthropometric Measurements Height: 5\' 7"  (1.702 m) Weight: 205 lb (93 kg) BMI (Calculated): 32.1 Weight at Last Visit: 206lb Weight Lost Since Last Visit: 1lb Weight Gained Since Last Visit: 0lb Starting Weight: 210lb Total Weight Loss (lbs): 5 lb (2.268 kg)   Body Composition  Body Fat %: 41.2 % Fat Mass (lbs): 84.6 lbs Muscle Mass (lbs): 114.6 lbs Total Body Water (lbs):  82.6 lbs Visceral Fat Rating : 11   Other Clinical Data Fasting: no Labs: no Today's Visit #: 10 Starting Date: 09/09/22   Objective:   PHYSICAL EXAM: Blood pressure 115/76, pulse 83, temperature 97.7 F (36.5 C), height 5\' 7"  (1.702 m), weight 205 lb (93 kg), SpO2 98%. Body mass index is 32.11 kg/m.  General: she is overweight, cooperative and in no acute distress. PSYCH: Has normal mood, affect and thought process.   HEENT: EOMI, sclerae are anicteric. Lungs: Normal breathing effort, no conversational dyspnea. Extremities: Moves * 4 Neurologic: A and O * 3, good insight  DIAGNOSTIC DATA REVIEWED: BMET    Component Value Date/Time   NA 139 03/17/2023 0936   K 5.0 03/17/2023 0936   CL 101 03/17/2023 0936   CO2 24 03/17/2023 0936   GLUCOSE 84 03/17/2023 0936   BUN 21 03/17/2023 0936   CREATININE 0.90 03/17/2023 0936   CALCIUM 10.2 03/17/2023 0936   Lab Results  Component Value Date  HGBA1C 5.6 03/17/2023   HGBA1C 5.5 09/08/2022   Lab Results  Component Value Date   INSULIN 11.0 03/17/2023   INSULIN 9.3 09/08/2022   Lab Results  Component Value Date   TSH 1.280 09/08/2022   CBC    Component Value Date/Time   WBC 5.6 09/08/2022 0854   RBC 5.13 09/08/2022 0854   HGB 13.5 09/08/2022 0854   HCT 43.1 09/08/2022 0854   PLT 219 09/08/2022 0854   MCV 84 09/08/2022 0854   MCH 26.3 (L) 09/08/2022 0854   MCHC 31.3 (L) 09/08/2022 0854   RDW 14.7 09/08/2022 0854   Iron Studies No results found for: "IRON", "TIBC", "FERRITIN", "IRONPCTSAT" Lipid Panel     Component Value Date/Time   CHOL 273 (A) 01/06/2023 0000   CHOL 235 (H) 09/08/2022 0854   TRIG 65 01/06/2023 0000   HDL 102 (A) 01/06/2023 0000   HDL 90 09/08/2022 0854   CHOLHDL 2.6 09/08/2022 0854   LDLCALC 158 01/06/2023 0000   LDLCALC 126 (H) 09/08/2022 0854   Hepatic Function Panel     Component Value Date/Time   PROT 7.0 03/17/2023 0936   ALBUMIN 4.6 03/17/2023 0936   AST 19 03/17/2023 0936    ALT 14 03/17/2023 0936   ALKPHOS 82 03/17/2023 0936   BILITOT 0.3 03/17/2023 0936      Component Value Date/Time   TSH 1.280 09/08/2022 0854   Nutritional Lab Results  Component Value Date   VD25OH 28.0 (L) 03/17/2023   VD25OH 20.9 (L) 09/08/2022    Attestations:   I, Special Puri, acting as a Stage manager for Thomasene Lot, DO., have compiled all relevant documentation for today's office visit on behalf of Thomasene Lot, DO, while in the presence of [employer], DO.  Reviewed by clinician on day of visit: allergies, medications, problem list, medical history, surgical history, family history, social history, and previous encounter notes pertinent to patient's obesity diagnosis.I have spent 40 minutes in the care of the patient today including:preparing to see patient (e.g. review and interpretation of tests, old notes ), obtaining and/or reviewing separately obtained history, performing a medically appropriate examination or evaluation, counseling and educating the patient, ordering medications, test or procedures, documenting clinical information in the electronic or other health care record, and independently interpreting results and communicating results to the patient, family, or caregiver   I have reviewed the above documentation for accuracy and completeness, and I agree with the above. Andrea Bridges, D.O.  The 21st Century Cures Act was signed into law in 2016 which includes the topic of electronic health records.  This provides immediate access to information in MyChart.  This includes consultation notes, operative notes, office notes, lab results and pathology reports.  If you have any questions about what you read please let us know at your next visit so we can discuss your concerns and take corrective action if need be.  We are right here with you.

## 2023-04-01 ENCOUNTER — Other Ambulatory Visit (HOSPITAL_COMMUNITY): Payer: Self-pay

## 2023-04-04 ENCOUNTER — Other Ambulatory Visit (HOSPITAL_COMMUNITY): Payer: Self-pay

## 2023-04-05 ENCOUNTER — Other Ambulatory Visit (HOSPITAL_COMMUNITY): Payer: Self-pay

## 2023-04-14 ENCOUNTER — Other Ambulatory Visit (HOSPITAL_COMMUNITY): Payer: Self-pay

## 2023-04-14 ENCOUNTER — Ambulatory Visit (INDEPENDENT_AMBULATORY_CARE_PROVIDER_SITE_OTHER): Payer: No Typology Code available for payment source | Admitting: Family Medicine

## 2023-04-14 ENCOUNTER — Encounter (INDEPENDENT_AMBULATORY_CARE_PROVIDER_SITE_OTHER): Payer: Self-pay | Admitting: Family Medicine

## 2023-04-14 VITALS — BP 93/64 | HR 84 | Temp 97.6°F | Ht 67.0 in | Wt 203.0 lb

## 2023-04-14 DIAGNOSIS — Z6831 Body mass index (BMI) 31.0-31.9, adult: Secondary | ICD-10-CM | POA: Diagnosis not present

## 2023-04-14 DIAGNOSIS — E669 Obesity, unspecified: Secondary | ICD-10-CM | POA: Diagnosis not present

## 2023-04-14 DIAGNOSIS — E88819 Insulin resistance, unspecified: Secondary | ICD-10-CM | POA: Diagnosis not present

## 2023-04-14 DIAGNOSIS — E559 Vitamin D deficiency, unspecified: Secondary | ICD-10-CM

## 2023-04-14 DIAGNOSIS — Z6832 Body mass index (BMI) 32.0-32.9, adult: Secondary | ICD-10-CM

## 2023-04-14 MED ORDER — VITAMIN D (ERGOCALCIFEROL) 1.25 MG (50000 UNIT) PO CAPS
50000.0000 [IU] | ORAL_CAPSULE | ORAL | 0 refills | Status: DC
  Filled 2023-04-14 – 2023-04-28 (×2): qty 4, 28d supply, fill #0

## 2023-04-14 MED ORDER — WEGOVY 1 MG/0.5ML ~~LOC~~ SOAJ
1.0000 mg | SUBCUTANEOUS | 0 refills | Status: DC
  Filled 2023-04-14 – 2023-04-28 (×2): qty 2, 28d supply, fill #0

## 2023-04-14 NOTE — Progress Notes (Signed)
[name], D.O.  ABFM, ABOM Specializing in Clinical Bariatric Medicine  Office located at: [address] [place], [place]  [address]   Assessment and Plan:   FOR THE DISEASE OF OBESITY: Since last office visit on 1121/24 patient's  Muscle mass has decreased by 1.2 lb. Fat mass has decreased by 0.6 lb. Total body water has decreased by 1.8 lb.  Counseling done on how various foods will affect these numbers and how to maximize success  Total lbs lost to date: 7 lbs  Total weight loss percentage to date: 3.33%     Recommended Dietary Goals Andrea Bridges is currently in the action stage of change. As such, her goal is to continue weight management plan.  She has agreed to: continue current plan   Behavioral Intervention We discussed the following today: continue to practice mindfulness when eating ,  high protein snacking choices with a 10:1 ratio, the ideal Healthy Eating Plate.  Additional resources provided today: Handout on  holiday eating strategies  Evidence-based interventions for health behavior change were utilized today including the discussion of self monitoring techniques, problem-solving barriers and SMART goal setting techniques.   Regarding patient's less desirable eating habits and patterns, we employed the technique of small changes.   Pt will specifically work on: n/a   Recommended Physical Activity Goals Andrea Bridges has been advised to work up to 150 minutes of moderate intensity aerobic activity a week and strengthening exercises 2-3 times per week for cardiovascular health, weight loss maintenance and preservation of muscle mass.   She has agreed to : Think about enjoyable ways to increase daily physical activity and overcoming barriers to exercise   Pharmacotherapy We increased her Wegovy to 1 mg LOV, she will start this dose tonight.    FOR ASSOCIATED CONDITIONS ADDRESSED TODAY:  BMI 32.0-32.9,adult - current BMI 31.79 Obesity, Beginning BMI  32.89 Assessment & Plan: See pharmacotherapy note above.   Orders: -     Wegovy; Inject 1 mg into the skin once a week.  Dispense: 2 mL; Refill: 0   Insulin resistance Assessment & Plan: Most recent hemoglobin A1c and fasting insulin: Lab Results  Component Value Date   HGBA1C 5.6 03/17/2023   HGBA1C 5.2 01/06/2023   HGBA1C 5.5 09/08/2022   INSULIN 11.0 03/17/2023   INSULIN 9.3 09/08/2022    Her hunger and cravings are well controlled when following her reduced calorie nutritional plan. She will continue to  decrease simple carbs/ sugars; increase fiber and proteins -> follow her meal plan. Labs; future.    Vitamin D deficiency Assessment & Plan: Most recent vit D:  Lab Results  Component Value Date   VD25OH 28.0 (L) 03/17/2023   VD25OH 20.9 (L) 09/08/2022   She reports good compliance and tolerance with ERGO 50,000 units every 7 days. Continue with weight loss efforts and prescription high dose vitamin D.  Orders: -     Vitamin D (Ergocalciferol); Take 1 capsule (50,000 Units total) by mouth every 7 (seven) days.  Dispense: 4 capsule; Refill: 0   Follow up:   Return 05/25/2023. She was informed of the importance of frequent follow up visits to maximize her success with intensive lifestyle modifications for her multiple health conditions.  Subjective:   Chief complaint: Obesity Andrea Bridges is here to discuss her progress with her obesity treatment plan. She  is journaling 1,000 calories & 80+ grams of protein with the Category 1 meal plan as a guide and states she is following her eating  plan approximately 60% of the time. She states she is not exercising.  Interval History:  Andrea Bridges is here for a follow up office visit. Since last OV, she is down 2 lbs. She reports good adherence to her Category 1 MP. Does not journal. Thanksgiving wise, she practiced portion control and was protein focused. Hunger and cravings are stable. Has an upcoming cruise trip.   Pharmacotherapy  for weight loss: She has been taking  Wegovy 0.5 mg once a week . Will start the 1 mg Wegovy tonight.   Review of Systems:  Pertinent positives were addressed with patient today.  Reviewed by clinician on day of visit: allergies, medications, problem list, medical history, surgical history, family history, social history, and previous encounter notes.  Weight Summary and Biometrics   Weight Lost Since Last Visit: 2lb  Weight Gained Since Last Visit: 0lb   Vitals Temp: 97.6 F (36.4 C) BP: 93/64 Pulse Rate: 84 SpO2: 99 %   Anthropometric Measurements Height: 5\' 7"  (1.702 m) Weight: 203 lb (92.1 kg) BMI (Calculated): 31.79 Weight at Last Visit: 205lb Weight Lost Since Last Visit: 2lb Weight Gained Since Last Visit: 0lb Starting Weight: 210lb Total Weight Loss (lbs): 7 lb (3.175 kg)   Body Composition  Body Fat %: 41.3 % Fat Mass (lbs): 84 lbs Muscle Mass (lbs): 113.4 lbs Total Body Water (lbs): 80.8 lbs Visceral Fat Rating : 11   Other Clinical Data Fasting: no Labs: no Today's Visit #: 11 Starting Date: 09/09/22   Objective:   PHYSICAL EXAM: Blood pressure 93/64, pulse 84, temperature 97.6 F (36.4 C), height 5\' 7"  (1.702 m), weight 203 lb (92.1 kg), SpO2 99%. Body mass index is 31.79 kg/m.  General: she is overweight, cooperative and in no acute distress. PSYCH: Has normal mood, affect and thought process.   HEENT: EOMI, sclerae are anicteric. Lungs: Normal breathing effort, no conversational dyspnea. Extremities: Moves * 4 Neurologic: A and O * 3, good insight  DIAGNOSTIC DATA REVIEWED: BMET    Component Value Date/Time   NA 139 03/17/2023 0936   K 5.0 03/17/2023 0936   CL 101 03/17/2023 0936   CO2 24 03/17/2023 0936   GLUCOSE 84 03/17/2023 0936   BUN 21 03/17/2023 0936   CREATININE 0.90 03/17/2023 0936   CALCIUM 10.2 03/17/2023 0936   Lab Results  Component Value Date   HGBA1C 5.6 03/17/2023   HGBA1C 5.5 09/08/2022   Lab Results   Component Value Date   INSULIN 11.0 03/17/2023   INSULIN 9.3 09/08/2022   Lab Results  Component Value Date   TSH 1.280 09/08/2022   CBC    Component Value Date/Time   WBC 5.6 09/08/2022 0854   RBC 5.13 09/08/2022 0854   HGB 13.5 09/08/2022 0854   HCT 43.1 09/08/2022 0854   PLT 219 09/08/2022 0854   MCV 84 09/08/2022 0854   MCH 26.3 (L) 09/08/2022 0854   MCHC 31.3 (L) 09/08/2022 0854   RDW 14.7 09/08/2022 0854   Iron Studies No results found for: "IRON", "TIBC", "FERRITIN", "IRONPCTSAT" Lipid Panel     Component Value Date/Time   CHOL 273 (A) 01/06/2023 0000   CHOL 235 (H) 09/08/2022 0854   TRIG 65 01/06/2023 0000   HDL 102 (A) 01/06/2023 0000   HDL 90 09/08/2022 0854   CHOLHDL 2.6 09/08/2022 0854   LDLCALC 158 01/06/2023 0000   LDLCALC 126 (H) 09/08/2022 0854   Hepatic Function Panel     Component Value Date/Time   PROT 7.0  03/17/2023 0936   ALBUMIN 4.6 03/17/2023 0936   AST 19 03/17/2023 0936   ALT 14 03/17/2023 0936   ALKPHOS 82 03/17/2023 0936   BILITOT 0.3 03/17/2023 0936      Component Value Date/Time   TSH 1.280 09/08/2022 0854   Nutritional Lab Results  Component Value Date   VD25OH 28.0 (L) 03/17/2023   VD25OH 20.9 (L) 09/08/2022    Attestations:   I, Special Puri, acting as a Stage manager for Andrea Lot, DO., have compiled all relevant documentation for today's office visit on behalf of Andrea Lot, DO, while in the presence of [employer], DO.  I have reviewed the above documentation for accuracy and completeness, and I agree with the above. [name], D.O.  The 21st Century Cures Act was signed into law in 2016 which includes the topic of electronic health records.  This provides immediate access to information in MyChart.  This includes consultation notes, operative notes, office notes, lab results and pathology reports.  If you have any questions about what you read please let us know at your next visit so we can  discuss your concerns and take corrective action if need be.  We are right here with you.

## 2023-04-28 ENCOUNTER — Other Ambulatory Visit (HOSPITAL_COMMUNITY): Payer: Self-pay

## 2023-04-29 ENCOUNTER — Other Ambulatory Visit (HOSPITAL_COMMUNITY): Payer: Self-pay

## 2023-05-02 ENCOUNTER — Other Ambulatory Visit (HOSPITAL_COMMUNITY): Payer: Self-pay

## 2023-05-25 ENCOUNTER — Ambulatory Visit (INDEPENDENT_AMBULATORY_CARE_PROVIDER_SITE_OTHER): Payer: No Typology Code available for payment source | Admitting: Family Medicine

## 2023-05-25 ENCOUNTER — Encounter (INDEPENDENT_AMBULATORY_CARE_PROVIDER_SITE_OTHER): Payer: Self-pay | Admitting: Family Medicine

## 2023-05-25 ENCOUNTER — Other Ambulatory Visit (HOSPITAL_COMMUNITY): Payer: Self-pay

## 2023-05-25 VITALS — BP 118/67 | HR 84 | Temp 97.8°F | Ht 67.0 in | Wt 200.0 lb

## 2023-05-25 DIAGNOSIS — E669 Obesity, unspecified: Secondary | ICD-10-CM

## 2023-05-25 DIAGNOSIS — Z6831 Body mass index (BMI) 31.0-31.9, adult: Secondary | ICD-10-CM | POA: Diagnosis not present

## 2023-05-25 DIAGNOSIS — Z6832 Body mass index (BMI) 32.0-32.9, adult: Secondary | ICD-10-CM

## 2023-05-25 DIAGNOSIS — E559 Vitamin D deficiency, unspecified: Secondary | ICD-10-CM | POA: Diagnosis not present

## 2023-05-25 MED ORDER — VITAMIN D (ERGOCALCIFEROL) 1.25 MG (50000 UNIT) PO CAPS
50000.0000 [IU] | ORAL_CAPSULE | ORAL | 0 refills | Status: DC
  Filled 2023-05-25: qty 4, 28d supply, fill #0

## 2023-05-25 MED ORDER — WEGOVY 1 MG/0.5ML ~~LOC~~ SOAJ
1.0000 mg | SUBCUTANEOUS | 0 refills | Status: DC
  Filled 2023-05-25 – 2023-06-14 (×2): qty 2, 28d supply, fill #0

## 2023-05-25 NOTE — Progress Notes (Signed)
[name], D.O.  ABFM, ABOM Specializing in Clinical Bariatric Medicine  Office located at: [address] [place], [place]  [address]   Assessment and Plan:   FOR THE DISEASE OF OBESITY: Since last office visit on 04/14/23 patient's muscle mass has decreased by 3.6 lb. Fat mass has increased by 1.2 lb. Total body water has increased by 0.8 lb.  Counseling done on how various foods will affect these numbers and how to maximize success  Total lbs lost to date: 9 lbs  Total weight loss percentage to date: 4.76%    Recommended Dietary Goals Myalee is currently in the action stage of change. As such, her goal is to continue weight management plan.  She has agreed to: continue current plan   Behavioral Intervention We discussed the following today: continue to work on maintaining a reduced calorie state, getting the recommended amount of protein, incorporating whole foods, making healthy choices, staying well hydrated and practicing mindfulness when eating.  Additional resources provided today: None  Evidence-based interventions for health behavior change were utilized today including the discussion of self monitoring techniques, problem-solving barriers and SMART goal setting techniques.   Regarding patient's less desirable eating habits and patterns, we employed the technique of small changes.   Pt will specifically work on: n/a   Recommended Physical Activity Goals Para has been advised to work up to 150 minutes of moderate intensity aerobic activity a week and strengthening exercises 2-3 times per week for cardiovascular health, weight loss maintenance and preservation of muscle mass.   She has agreed to : increase walking to 30 minutes, 3 days a week and start stretching regimen   Pharmacotherapy We both agreed to : continue with nutritional and behavioral strategies and continue current anti-obesity medication regimen   FOR ASSOCIATED CONDITIONS ADDRESSED  TODAY:  BMI 32.0-32.9,adult - current BMI 31.32 Obesity, Beginning BMI 32.89 Assessment & Plan: Because of a recent cruise trip and then falling sick with the flu for a week, Mrs.Starling has been off her [facility] for about 2 weeks. She will be restarting it and has about a 4 week supply left. As long as pt gets in her proteins and avoids simple carbohydrates, she tolerates the [facility] well.   Continue GLP1-therapy. Reminded pt that the more she eats "off-plan", the more likely for her to have side effects of the drug. Continue to work on weight loss therapy via journaling plan.   Orders: -     Wegovy; Inject 1 mg into the skin once a week.  Dispense: 2 mL; Refill: 0   Vitamin D deficiency Assessment & Plan: No issues with ERGO 50,000 units once weekly, compliance and tolerance good. Continue current regimen and weight loss efforts. Refill supplement today.   Orders: -     Vitamin D (Ergocalciferol); Take 1 capsule (50,000 Units total) by mouth every 7 (seven) days.  Dispense: 4 capsule; Refill: 0   Follow up:   Return 07/01/23. She was informed of the importance of frequent follow up visits to maximize her success with intensive lifestyle modifications for her multiple health conditions.  Subjective:   Chief complaint: Obesity Sesley is here to discuss her progress with her obesity treatment plan. She is journaling 1,000 calories & 80+ grams of protein with the Category 1 meal plan and states she is following her eating plan approximately 40% of the time. She states she is walking 30 minutes, 2 days a week.   Interval History:  Hilinai Saccente is  here for a follow up office visit. Since last OV on 04/14/2023, Mrs.Genova is down 3 lbs. She has been doing well this past month. Had a good holiday period. Recently returned from a cruise trip. After the cruise trip, pt had a "terrible flu" for about a week- symptoms have resolved now. Food wise, apart from a few days of over-indulging, pt feels that she's  been eating healthy. Pt planning to stretch and use treadmill more.   Pharmacotherapy for weight loss: She is prescribed Wegovy 1 mg weekly.   Review of Systems:  Pertinent positives were addressed with patient today.  Reviewed by clinician on day of visit: allergies, medications, problem list, medical history, surgical history, family history, social history, and previous encounter notes.  Weight Summary and Biometrics   Weight Lost Since Last Visit: 2 lb  Weight Gained Since Last Visit: 0   Vitals Temp: 97.8 F (36.6 C) BP: 118/67 Pulse Rate: 84 SpO2: 97 %   Anthropometric Measurements Height: 5\' 7"  (1.702 m) Weight: 200 lb (90.7 kg) BMI (Calculated): 31.32 Weight at Last Visit: 202 lb Weight Lost Since Last Visit: 2 lb Weight Gained Since Last Visit: 0 Starting Weight: 210 lb Total Weight Loss (lbs): 9 lb (4.082 kg)   Body Composition  Body Fat %: 42.4 % Fat Mass (lbs): 85.2 lbs Muscle Mass (lbs): 109.8 lbs Total Body Water (lbs): 81.6 lbs Visceral Fat Rating : 11   Other Clinical Data Fasting: no Labs: no Today's Visit #: 12 Starting Date: 09/09/22   Objective:   PHYSICAL EXAM: Blood pressure 118/67, pulse 84, temperature 97.8 F (36.6 C), height 5\' 7"  (1.702 m), weight 200 lb (90.7 kg), SpO2 97%. Body mass index is 31.32 kg/m.  General: she is overweight, cooperative and in no acute distress. PSYCH: Has normal mood, affect and thought process.   HEENT: EOMI, sclerae are anicteric. Lungs: Normal breathing effort, no conversational dyspnea. Extremities: Moves * 4 Neurologic: A and O * 3, good insight  DIAGNOSTIC DATA REVIEWED: BMET    Component Value Date/Time   NA 139 03/17/2023 0936   K 5.0 03/17/2023 0936   CL 101 03/17/2023 0936   CO2 24 03/17/2023 0936   GLUCOSE 84 03/17/2023 0936   BUN 21 03/17/2023 0936   CREATININE 0.90 03/17/2023 0936   CALCIUM 10.2 03/17/2023 0936   Lab Results  Component Value Date   HGBA1C 5.6 03/17/2023    HGBA1C 5.5 09/08/2022   Lab Results  Component Value Date   INSULIN 11.0 03/17/2023   INSULIN 9.3 09/08/2022   Lab Results  Component Value Date   TSH 1.280 09/08/2022   CBC    Component Value Date/Time   WBC 5.6 09/08/2022 0854   RBC 5.13 09/08/2022 0854   HGB 13.5 09/08/2022 0854   HCT 43.1 09/08/2022 0854   PLT 219 09/08/2022 0854   MCV 84 09/08/2022 0854   MCH 26.3 (L) 09/08/2022 0854   MCHC 31.3 (L) 09/08/2022 0854   RDW 14.7 09/08/2022 0854   Iron Studies No results found for: "IRON", "TIBC", "FERRITIN", "IRONPCTSAT" Lipid Panel     Component Value Date/Time   CHOL 273 (A) 01/06/2023 0000   CHOL 235 (H) 09/08/2022 0854   TRIG 65 01/06/2023 0000   HDL 102 (A) 01/06/2023 0000   HDL 90 09/08/2022 0854   CHOLHDL 2.6 09/08/2022 0854   LDLCALC 158 01/06/2023 0000   LDLCALC 126 (H) 09/08/2022 0854   Hepatic Function Panel     Component Value Date/Time  PROT 7.0 03/17/2023 0936   ALBUMIN 4.6 03/17/2023 0936   AST 19 03/17/2023 0936   ALT 14 03/17/2023 0936   ALKPHOS 82 03/17/2023 0936   BILITOT 0.3 03/17/2023 0936      Component Value Date/Time   TSH 1.280 09/08/2022 0854   Nutritional Lab Results  Component Value Date   VD25OH 28.0 (L) 03/17/2023   VD25OH 20.9 (L) 09/08/2022    Attestations:   I, Special Puri, acting as a Stage manager for Thomasene Lot, DO., have compiled all relevant documentation for today's office visit on behalf of Thomasene Lot, DO, while in the presence of [employer], DO.  I have reviewed the above documentation for accuracy and completeness, and I agree with the above. [name], D.O.  The 21st Century Cures Act was signed into law in 2016 which includes the topic of electronic health records.  This provides immediate access to information in MyChart.  This includes consultation notes, operative notes, office notes, lab results and pathology reports.  If you have any questions about what you read please let us  know at your next visit so we can discuss your concerns and take corrective action if need be.  We are right here with you.

## 2023-06-08 ENCOUNTER — Ambulatory Visit: Payer: No Typology Code available for payment source | Admitting: Family Medicine

## 2023-06-08 ENCOUNTER — Encounter: Payer: Self-pay | Admitting: Family Medicine

## 2023-06-08 VITALS — BP 108/76 | HR 77 | Temp 97.9°F | Ht 67.5 in | Wt 198.8 lb

## 2023-06-08 DIAGNOSIS — D126 Benign neoplasm of colon, unspecified: Secondary | ICD-10-CM

## 2023-06-08 DIAGNOSIS — Z23 Encounter for immunization: Secondary | ICD-10-CM

## 2023-06-08 DIAGNOSIS — Z7689 Persons encountering health services in other specified circumstances: Secondary | ICD-10-CM

## 2023-06-08 DIAGNOSIS — E559 Vitamin D deficiency, unspecified: Secondary | ICD-10-CM | POA: Diagnosis not present

## 2023-06-08 NOTE — Patient Instructions (Addendum)
Return in about 15 weeks (around 09/21/2023) for cpe (20 min), Routine chronic condition follow-up.        Great to see you today.  I have refilled the medication(s) we provide.   If labs were collected or images ordered, we will inform you of  results once we have received them and reviewed. We will contact you either by echart message, or telephone call.  Please give ample time to the testing facility, and our office to run,  receive and review results. Please do not call inquiring of results, even if you can see them in your chart. We will contact you as soon as we are able. If it has been over 1 week since the test was completed, and you have not yet heard from [place], then please call us.    - echart message- for normal results that have been seen by the patient already.   - telephone call: abnormal results or if patient has not viewed results in their echart.  If a referral to a specialist was entered for you, please call us in 2 weeks if you have not heard from the specialist office to schedule.

## 2023-06-08 NOTE — Progress Notes (Unsigned)
Patient ID: Andrea Bridges, female  DOB: May 05, 1961, 63 y.o.   MRN: [id number] Patient Care Team    Relationship Specialty Notifications Start End  Natalia Leatherwood, DO PCP - General Family Medicine  06/08/23   Ob/Gyn, Nestor Ramp    06/08/23   Jeani Hawking, MD Consulting Physician Gastroenterology  06/09/23     Chief Complaint  Patient presents with   Establish Care   VIT D DEF    Subjective:  Andrea Bridges is a 63 y.o.  female present for new patient establishment. All past medical history, surgical history, allergies, family history, immunizations, medications and social history were updated in the electronic medical record today. All recent labs, ED visits and hospitalizations within the last year were reviewed.  Patient reports she has a history of vitamin D deficiency.  She is currently a patient of healthy weight and wellness who is providing her assistance in weight loss counseling.  She has been prescribed Wegovy and is currently on 1 mg weekly injection.  They are supplementing her with vitamin D and last level showed some improvement in her vitamin D levels.     06/08/2023    9:53 AM  Depression screen PHQ 2/9  Decreased Interest 1  Down, Depressed, Hopeless 0  PHQ - 2 Score 1  Altered sleeping 0  Tired, decreased energy 1  Feeling bad or failure about yourself  0  Trouble concentrating 0  Moving slowly or fidgety/restless 0  Suicidal thoughts 0  PHQ-9 Score 2  Difficult doing work/chores Not difficult at all      06/08/2023    9:54 AM  GAD 7 : Generalized Anxiety Score  Nervous, Anxious, on Edge 1  Control/stop worrying 0  Worry too much - different things 1  Trouble relaxing 0  Restless 0  Easily annoyed or irritable 1  Afraid - awful might happen 0  Total GAD 7 Score 3  Anxiety Difficulty Not difficult at all           06/08/2023    9:53 AM  Fall Risk   Falls in the past year? 0  Number falls in past yr: 0  Injury with Fall? 0  Risk for fall due  to : No Fall Risks  Follow up Falls evaluation completed     Immunization History  Administered Date(s) Administered   Hep B, Unspecified 02/08/2007   Hepatitis A, Adult 02/08/2007   Influenza Inj Mdck Quad Pf 05/25/2017   Influenza, Mdck, Trivalent,PF 6+ MOS(egg free) 03/18/2014   Influenza, Quadrivalent, Recombinant, Inj, Pf 03/16/2018   Influenza-Unspecified 01/08/2013, 02/08/2023   MMR 11/08/1966   Tdap 11/08/1967   Varicella 12/09/1966    No results found.  Past Medical History:  Diagnosis Date   Acute appendicitis with perforation and localized peritonitis, without abscess or gangrene 12/11/2019   Anxiety    Infertility, female    PCOS (polycystic ovarian syndrome)    Skin cancer    basal cell   SOBOE (shortness of breath on exertion) 09/08/2022   No Known Allergies Past Surgical History:  Procedure Laterality Date   APPENDECTOMY  2021   BREAST BIOPSY     Family History  Problem Relation Age of Onset   Thyroid disease Mother    Anxiety disorder Mother    Obesity Mother    Cancer Father        skin   Sleep apnea Father    Social History   Social History Narrative   Marital status/children/pets:  Divorced, 1 child   Education/employment: Some college.  Works as a Education administrator:      -smoke alarm in the home:Yes     - wears seatbelt: Yes     - Feels safe in their relationships: Yes       Allergies as of 06/08/2023   No Known Allergies      Medication List        Accurate as of June 08, 2023 11:59 PM. If you have any questions, ask your nurse or doctor.          Vitamin D (Ergocalciferol) 1.25 MG (50000 UNIT) Caps capsule Commonly known as: DRISDOL Take 1 capsule (50,000 Units total) by mouth every 7 (seven) days.   Wegovy 1 MG/0.5ML Soaj Generic drug: Semaglutide-Weight Management Inject 1 mg into the skin once a week.        All past medical history, surgical history, allergies, family history, immunizations  andmedications were updated in the EMR today and reviewed under the history and medication portions of their EMR.       ROS 14 pt review of systems performed and negative (unless mentioned in an HPI)  Objective: BP 108/76   Pulse 77   Temp 97.9 F (36.6 C)   Ht 5' 7.5" (1.715 m)   Wt 198 lb 12.8 oz (90.2 kg)   LMP  (LMP Unknown)   SpO2 96%   BMI 30.68 kg/m  Physical Exam Vitals and nursing note reviewed.  Constitutional:      General: She is not in acute distress.    Appearance: Normal appearance. She is not ill-appearing, toxic-appearing or diaphoretic.  HENT:     Head: Normocephalic and atraumatic.  Eyes:     General: No scleral icterus.       Right eye: No discharge.        Left eye: No discharge.     Extraocular Movements: Extraocular movements intact.     Conjunctiva/sclera: Conjunctivae normal.     Pupils: Pupils are equal, round, and reactive to light.  Cardiovascular:     Rate and Rhythm: Normal rate and regular rhythm.  Pulmonary:     Effort: Pulmonary effort is normal. No respiratory distress.     Breath sounds: Normal breath sounds. No wheezing, rhonchi or rales.  Musculoskeletal:     Right lower leg: No edema.     Left lower leg: No edema.  Skin:    General: Skin is warm.     Findings: No rash.  Neurological:     Mental Status: She is alert and oriented to person, place, and time. Mental status is at baseline.     Motor: No weakness.     Gait: Gait normal.  Psychiatric:        Mood and Affect: Mood normal.        Behavior: Behavior normal.        Thought Content: Thought content normal.        Judgment: Judgment normal.      Assessment/plan: Andrea Bridges is a 63 y.o. female present for establish care Establishing care with new doctor, encounter for Vitamin D deficiency (Primary) Improving.  Continue supplementation Need for Tdap vaccination Provided today Adenomatous polyp of colon, unspecified part of colon Repeat due 2026, complete records  requested.  Now has transferred from [facility] to Dr. Elnoria Howard.  Return in about 15 weeks (around 09/21/2023) for cpe (20 min), Routine chronic condition follow-up.  No orders of the defined types  were placed in this encounter.  No orders of the defined types were placed in this encounter.  Referral Orders  No referral(s) requested today     Note is dictated utilizing voice recognition software. Although note has been proof read prior to signing, occasional typographical errors still can be missed. If any questions arise, please do not hesitate to call for verification.  Electronically signed by: Felix Pacini, DO [facility]

## 2023-06-09 ENCOUNTER — Encounter: Payer: Self-pay | Admitting: Family Medicine

## 2023-06-09 DIAGNOSIS — K635 Polyp of colon: Secondary | ICD-10-CM | POA: Insufficient documentation

## 2023-06-09 NOTE — Addendum Note (Signed)
Addended by: Filomena Jungling on: 06/09/2023 02:22 PM   Modules accepted: Orders

## 2023-06-10 NOTE — Telephone Encounter (Signed)
 No further action needed.

## 2023-06-14 ENCOUNTER — Other Ambulatory Visit (HOSPITAL_COMMUNITY): Payer: Self-pay

## 2023-06-16 ENCOUNTER — Telehealth: Payer: Self-pay

## 2023-06-16 NOTE — Telephone Encounter (Signed)
 PA submitted through Cover My Meds for Wegovy . Awaiting insurance determination. Key: [id number]

## 2023-06-21 ENCOUNTER — Telehealth (INDEPENDENT_AMBULATORY_CARE_PROVIDER_SITE_OTHER): Payer: Self-pay | Admitting: Family Medicine

## 2023-06-21 ENCOUNTER — Other Ambulatory Visit (HOSPITAL_COMMUNITY): Payer: Self-pay

## 2023-06-21 NOTE — Telephone Encounter (Signed)
PA sent to plan 06/21/23

## 2023-06-21 NOTE — Telephone Encounter (Signed)
Pt called stating that she needs a Prior Authorization for her Wegovy. Pt cannot obtain the medication at this time. Please call patient at the number on file.

## 2023-06-22 ENCOUNTER — Ambulatory Visit (INDEPENDENT_AMBULATORY_CARE_PROVIDER_SITE_OTHER): Payer: No Typology Code available for payment source | Admitting: Family Medicine

## 2023-06-22 ENCOUNTER — Encounter (INDEPENDENT_AMBULATORY_CARE_PROVIDER_SITE_OTHER): Payer: Self-pay | Admitting: Family Medicine

## 2023-06-22 VITALS — BP 98/66 | HR 75 | Temp 97.6°F | Ht 67.0 in | Wt 193.0 lb

## 2023-06-22 DIAGNOSIS — Z6830 Body mass index (BMI) 30.0-30.9, adult: Secondary | ICD-10-CM | POA: Diagnosis not present

## 2023-06-22 DIAGNOSIS — E669 Obesity, unspecified: Secondary | ICD-10-CM | POA: Diagnosis not present

## 2023-06-22 DIAGNOSIS — Z6832 Body mass index (BMI) 32.0-32.9, adult: Secondary | ICD-10-CM

## 2023-06-22 DIAGNOSIS — E559 Vitamin D deficiency, unspecified: Secondary | ICD-10-CM

## 2023-06-22 DIAGNOSIS — Z79899 Other long term (current) drug therapy: Secondary | ICD-10-CM

## 2023-06-22 DIAGNOSIS — E88819 Insulin resistance, unspecified: Secondary | ICD-10-CM

## 2023-06-22 NOTE — Progress Notes (Signed)
Andrea Bridges, D.O.  ABFM, ABOM Specializing in Clinical Bariatric Medicine  Office located at: [address] [place], [place]  [address]   Assessment and Plan:   Come fasting to her next OV to obtain labs (Vit D, fasting insulin, and FLP)  FOR THE DISEASE OF OBESITY: BMI 32.0-32.9,adult - current BMI 30.22 Obesity, Beginning BMI 32.89 Assessment & Plan: Since last office visit on 05/25/23 patient's muscle mass has decreased by 1.4 lb. Fat mass has decreased by 6.2lb. Total body water has decreased by 3.8lb.  Counseling done on how various foods will affect these numbers and how to maximize success  Total lbs lost to date: 17 lbs Total weight loss percentage to date: -8.10%    Recommended Dietary Goals Andrea Bridges is currently in the action stage of change. As such, her goal is to continue weight management plan.  She has agreed to: continue current plan   Behavioral Intervention We discussed the following today: increasing lean protein intake to established goals, decreasing simple carbohydrates , avoiding skipping meals, increasing water intake , avoiding temptations and identifying enticing environmental cues, and continue to work on maintaining a reduced calorie state, getting the recommended amount of protein, incorporating whole foods, making healthy choices, staying well hydrated and practicing mindfulness when eating.  Additional resources provided today: None  Evidence-based interventions for health behavior change were utilized today including the discussion of self monitoring techniques, problem-solving barriers and SMART goal setting techniques.   Regarding patient's less desirable eating habits and patterns, we employed the technique of small changes.   Pt will specifically work on: Start exercising for at least 30 minutes 3 days per week for next visit.    Recommended Physical Activity Goals Andrea Bridges has been advised to work up to 150 minutes of moderate  intensity aerobic activity a week and strengthening exercises 2-3 times per week for cardiovascular health, weight loss maintenance and preservation of muscle mass.   She has agreed to :  Think about enjoyable ways to increase daily physical activity and overcoming barriers to exercise, Increase the intensity, frequency or duration of strengthening exercises , and Increase the intensity, frequency or duration of aerobic exercises     Pharmacotherapy We both agreed to : continue with nutritional and behavioral strategies and adequate clinical response to current dose, continue current regimen when rx is obtained from pharmacy.    FOR ASSOCIATED CONDITIONS ADDRESSED TODAY: Vitamin D deficiency Assessment & Plan: Lab Results  Component Value Date   VD25OH 28.0 (L) 03/17/2023   VD25OH 20.9 (L) 09/08/2022   Vitamin D is below goal. Pt has not taken ERGO for a couple of weeks due to not being able to obtain meds from her pharmacy. She has called her pharmacy and was told it was not ready. She notes she has not yet been contacted to pick it up.   Ideal vitamin D goal of 50-70 reviewed with pt. Pt advised to call pharmacy again as her prescription issues are resolved with ERGO and [facility]. We will plan to recheck her vitamin D at her next OV.    Insulin resistance Assessment & Plan: Lab Results  Component Value Date   HGBA1C 5.6 03/17/2023   HGBA1C 5.2 01/06/2023   HGBA1C 5.5 09/08/2022   INSULIN 11.0 03/17/2023   INSULIN 9.3 09/08/2022    Last insulin was 11.0 as of 03/2023. Pt on Wegovy for weight management, tolerating well. No other meds. She has not been drinking as much water as previously  did. Has focused on eating more low carb and high protein foods.   Encouraged pt to increase her daily protein intake and work on increasing frequency of exercise per week. Increase water intake by drinking half her body weight in ounces of water per day. Continue current regimen as prescribed.     Medication management Assessment & Plan: Pt has been compliant with Wegovy with good tolerance, but states this Sunday she took her last available dose. On 10/2 insurance approved Wegovy for 1 year but when she went to pick up from last appt 1/15, she was told it required a prior authorization. After communicated to our office, prior authorization was started.   Pt was advised to call her insurance to clarify why an additional prior authorization is required. We will also try calling to see if we can expedite that for patient. Recommended pt gradually take Wegovy earlier in the week, preferably on Thursdays, to have optimal effectiveness over the weekends. Does not feel she needs to increase dose at this time.    Follow up:   Return in about 4 weeks (around 07/20/2023). She was informed of the importance of frequent follow up visits to maximize her success with intensive lifestyle modifications for her multiple health conditions.  Subjective:   Chief complaint: Obesity Andrea Bridges is here to discuss her progress with her obesity treatment plan. She is on the Category 1 Plan and states she is following her eating plan approximately 60% of the time. She states she is walking 15 minutes 1 days per week.  Interval History:  Andrea Bridges is here for a follow up office visit. Since last OV on , she is down 7 lbs. Is paying more attention to low cal and low carb foods. When going out to eat, she has been ordering on-plan foods such as salads. Has been focusing on eating more protein, but not necessarily getting in all her protein per day.   Pharmacotherapy for weight loss: She is currently taking Wegovy with adequate clinical response  and without side effects..   Review of Systems:  Pertinent positives were addressed with patient today.  Reviewed by clinician on day of visit: allergies, medications, problem list, medical history, surgical history, family history, social history, and previous encounter  notes.  Weight Summary and Biometrics   Weight Lost Since Last Visit: 7lb  Weight Gained Since Last Visit: 0    Vitals Temp: 97.6 F (36.4 C) BP: 98/66 Pulse Rate: 75 SpO2: 98 %   Anthropometric Measurements Height: 5\' 7"  (1.702 m) Weight: 193 lb (87.5 kg) BMI (Calculated): 30.22 Weight at Last Visit: 200lb Weight Lost Since Last Visit: 7lb Weight Gained Since Last Visit: 0 Starting Weight: 210lb Total Weight Loss (lbs): 16 lb (7.258 kg)   Body Composition  Body Fat %: 40.9 % Fat Mass (lbs): 79 lbs Muscle Mass (lbs): 108.4 lbs Total Body Water (lbs): 77.8 lbs Visceral Fat Rating : 11   Other Clinical Data Fasting: no Labs: no Today's Visit #: 13 Starting Date: 09/09/22    Objective:   PHYSICAL EXAM: Blood pressure 98/66, pulse 75, temperature 97.6 F (36.4 C), height 5\' 7"  (1.702 m), weight 193 lb (87.5 kg), SpO2 98%. Body mass index is 30.23 kg/m.  General: she is overweight, cooperative and in no acute distress. PSYCH: Has normal mood, affect and thought process.   HEENT: EOMI, sclerae are anicteric. Lungs: Normal breathing effort, no conversational dyspnea. Extremities: Moves * 4 Neurologic: A and O * 3, good insight  DIAGNOSTIC DATA REVIEWED: BMET    Component Value Date/Time   NA 139 03/17/2023 0936   K 5.0 03/17/2023 0936   CL 101 03/17/2023 0936   CO2 24 03/17/2023 0936   GLUCOSE 84 03/17/2023 0936   BUN 21 03/17/2023 0936   CREATININE 0.90 03/17/2023 0936   CALCIUM 10.2 03/17/2023 0936   Lab Results  Component Value Date   HGBA1C 5.6 03/17/2023   HGBA1C 5.5 09/08/2022   Lab Results  Component Value Date   INSULIN 11.0 03/17/2023   INSULIN 9.3 09/08/2022   Lab Results  Component Value Date   TSH 1.280 09/08/2022   CBC    Component Value Date/Time   WBC 5.6 09/08/2022 0854   RBC 5.13 09/08/2022 0854   HGB 13.5 09/08/2022 0854   HCT 43.1 09/08/2022 0854   PLT 219 09/08/2022 0854   MCV 84 09/08/2022 0854   MCH 26.3 (L)  09/08/2022 0854   MCHC 31.3 (L) 09/08/2022 0854   RDW 14.7 09/08/2022 0854   Iron Studies No results found for: "IRON", "TIBC", "FERRITIN", "IRONPCTSAT" Lipid Panel     Component Value Date/Time   CHOL 273 (A) 01/06/2023 0000   CHOL 235 (H) 09/08/2022 0854   TRIG 65 01/06/2023 0000   HDL 102 (A) 01/06/2023 0000   HDL 90 09/08/2022 0854   CHOLHDL 2.6 09/08/2022 0854   LDLCALC 158 01/06/2023 0000   LDLCALC 126 (H) 09/08/2022 0854   Hepatic Function Panel     Component Value Date/Time   PROT 7.0 03/17/2023 0936   ALBUMIN 4.6 03/17/2023 0936   AST 19 03/17/2023 0936   ALT 14 03/17/2023 0936   ALKPHOS 82 03/17/2023 0936   BILITOT 0.3 03/17/2023 0936      Component Value Date/Time   TSH 1.280 09/08/2022 0854   Nutritional Lab Results  Component Value Date   VD25OH 28.0 (L) 03/17/2023   VD25OH 20.9 (L) 09/08/2022    Attestations:   Burnett Sheng, acting as a medical scribe for Thomasene Lot, DO., have compiled all relevant documentation for today's office visit on behalf of Thomasene Lot, DO, while in the presence of [employer], DO.  Reviewed by clinician on day of visit: allergies, medications, problem list, medical history, surgical history, family history, social history, and previous encounter notes pertinent to patient's obesity diagnosis.  I have spent 40 minutes in the care of the patient today including: preparing to see patient (e.g. review and interpretation of tests, old notes ), obtaining and/or reviewing separately obtained history, performing a medically appropriate examination or evaluation, counseling and educating the patient, ordering medications, test or procedures, documenting clinical information in the electronic or other health care record, and independently interpreting results and communicating results to the patient, family, or caregiver   I have reviewed the above documentation for accuracy and completeness, and I agree with the above.  Andrea Bridges, D.O.  The 21st Century Cures Act was signed into law in 2016 which includes the topic of electronic health records.  This provides immediate access to information in MyChart.  This includes consultation notes, operative notes, office notes, lab results and pathology reports.  If you have any questions about what you read please let us know at your next visit so we can discuss your concerns and take corrective action if need be.  We are right here with you.

## 2023-06-23 ENCOUNTER — Other Ambulatory Visit (HOSPITAL_COMMUNITY): Payer: Self-pay

## 2023-06-23 NOTE — Telephone Encounter (Signed)
Patient reports the insurance stated that they are rejecting the prior authorization due to plan limitations, the drug has maximum of 4 per 180 days. Please follow up with the patient for next steps.

## 2023-06-27 ENCOUNTER — Other Ambulatory Visit (HOSPITAL_COMMUNITY): Payer: Self-pay

## 2023-06-28 ENCOUNTER — Telehealth (INDEPENDENT_AMBULATORY_CARE_PROVIDER_SITE_OTHER): Payer: Self-pay | Admitting: Family Medicine

## 2023-06-28 NOTE — Telephone Encounter (Signed)
Spoke w/ pt, pt verbalized doing well on current dose with essentially no side effects except after initial dose for 24 hours after, understands insurance requirements to increase the dose. Denies refill of current dose. Agrees to increase dose and understands risks benefits of this dose change which insurance requires. Will let us know of any questions or concerns after starting the new dose. Prescription was sent in to pharmacy. CS

## 2023-06-28 NOTE — Telephone Encounter (Signed)
Pt states that her Wegovy 1.0mg  has reached its refill limit with her pharmacy. Patient states that the pharmacy suggests that Dr. Val Eagle either increase or decrease her dosage and complete a Quantity Limit Extension. Please call the patient for an update when available.

## 2023-06-30 ENCOUNTER — Other Ambulatory Visit (INDEPENDENT_AMBULATORY_CARE_PROVIDER_SITE_OTHER): Payer: Self-pay

## 2023-06-30 ENCOUNTER — Other Ambulatory Visit (HOSPITAL_COMMUNITY): Payer: Self-pay

## 2023-06-30 DIAGNOSIS — Z6832 Body mass index (BMI) 32.0-32.9, adult: Secondary | ICD-10-CM

## 2023-06-30 DIAGNOSIS — E669 Obesity, unspecified: Secondary | ICD-10-CM

## 2023-06-30 MED ORDER — WEGOVY 1.7 MG/0.75ML ~~LOC~~ SOAJ
1.7000 mg | SUBCUTANEOUS | 0 refills | Status: DC
  Filled 2023-06-30: qty 3, 28d supply, fill #0

## 2023-07-01 ENCOUNTER — Other Ambulatory Visit (HOSPITAL_COMMUNITY): Payer: Self-pay

## 2023-07-18 ENCOUNTER — Telehealth (INDEPENDENT_AMBULATORY_CARE_PROVIDER_SITE_OTHER): Payer: Self-pay | Admitting: Family Medicine

## 2023-07-18 NOTE — Telephone Encounter (Signed)
 03/10 pt needs refill on vitiman D, doesn't have appt til 03/17. Pt is working from home today so would be easier to pick up prescription from [facility] in  [place]

## 2023-07-19 ENCOUNTER — Other Ambulatory Visit (INDEPENDENT_AMBULATORY_CARE_PROVIDER_SITE_OTHER): Payer: Self-pay | Admitting: Family Medicine

## 2023-07-19 DIAGNOSIS — E559 Vitamin D deficiency, unspecified: Secondary | ICD-10-CM

## 2023-07-20 ENCOUNTER — Other Ambulatory Visit (HOSPITAL_COMMUNITY): Payer: Self-pay

## 2023-07-21 NOTE — Telephone Encounter (Signed)
 Patient reports no one followed up with her on this Vitamin D refill and it was not [facility] to the [facility] in [place]. Please follow up with the patient.

## 2023-07-25 ENCOUNTER — Ambulatory Visit (INDEPENDENT_AMBULATORY_CARE_PROVIDER_SITE_OTHER): Payer: No Typology Code available for payment source | Admitting: Family Medicine

## 2023-07-25 ENCOUNTER — Other Ambulatory Visit (HOSPITAL_COMMUNITY): Payer: Self-pay

## 2023-07-25 ENCOUNTER — Encounter (INDEPENDENT_AMBULATORY_CARE_PROVIDER_SITE_OTHER): Payer: Self-pay | Admitting: Family Medicine

## 2023-07-25 VITALS — BP 110/71 | HR 72 | Temp 97.4°F | Ht 67.0 in | Wt 192.0 lb

## 2023-07-25 DIAGNOSIS — E88819 Insulin resistance, unspecified: Secondary | ICD-10-CM | POA: Diagnosis not present

## 2023-07-25 DIAGNOSIS — E7841 Elevated Lipoprotein(a): Secondary | ICD-10-CM | POA: Diagnosis not present

## 2023-07-25 DIAGNOSIS — Z6832 Body mass index (BMI) 32.0-32.9, adult: Secondary | ICD-10-CM

## 2023-07-25 DIAGNOSIS — E669 Obesity, unspecified: Secondary | ICD-10-CM

## 2023-07-25 DIAGNOSIS — Z6830 Body mass index (BMI) 30.0-30.9, adult: Secondary | ICD-10-CM

## 2023-07-25 DIAGNOSIS — E559 Vitamin D deficiency, unspecified: Secondary | ICD-10-CM

## 2023-07-25 MED ORDER — SEMAGLUTIDE-WEIGHT MANAGEMENT 1 MG/0.5ML ~~LOC~~ SOAJ
1.0000 mg | SUBCUTANEOUS | 0 refills | Status: DC
  Filled 2023-07-25 – 2023-08-11 (×6): qty 2, 28d supply, fill #0

## 2023-07-25 MED ORDER — VITAMIN D (ERGOCALCIFEROL) 1.25 MG (50000 UNIT) PO CAPS
50000.0000 [IU] | ORAL_CAPSULE | ORAL | 1 refills | Status: DC
  Filled 2023-07-25: qty 4, 28d supply, fill #0

## 2023-07-25 NOTE — Progress Notes (Signed)
 [name], D.O.  ABFM, ABOM Specializing in Clinical Bariatric Medicine  Office located at: [address] [place], [place]  [address]   Assessment and Plan:   FOR THE DISEASE OF OBESITY: BMI 30.0-30.9,adult - Current 30.06 Obesity, Beginning BMI 32.89 Assessment & Plan: Since last office visit on 06/22/2023, patient's muscle mass has not changed. Fat mass has decreased by 1 lb. Total body water has decreased by 0.6 lbs.  Counseling done on how various foods will affect these numbers and how to maximize success  Total lbs lost to date: 17 lbs Total weight loss percentage to date: 8.57%    Recommended Dietary Goals Andrea Bridges is currently in the action stage of change. As such, her goal is to continue weight management plan.  She has agreed to: continue current plan   Behavioral Intervention We discussed the following today: increasing lean protein intake to established goals and continue to work on implementation of reduced calorie nutritional plan  Additional resources provided today: None  Evidence-based interventions for health behavior change were utilized today including the discussion of self monitoring techniques, problem-solving barriers and SMART goal setting techniques.   Regarding patient's less desirable eating habits and patterns, we employed the technique of small changes.   Pt will specifically work on: Start exercising 30 mins 3 days a week for next visit.    Recommended Physical Activity Goals Andrea Bridges has been advised to work up to 150 minutes of moderate intensity aerobic activity a week and strengthening exercises 2-3 times per week for cardiovascular health, weight loss maintenance and preservation of muscle mass.   She has agreed to :  Think about enjoyable ways to increase daily physical activity and overcoming barriers to exercise   Pharmacotherapy We both agreed to :  Decrease Wegovy dose to 1 mg once weekly   FOR ASSOCIATED CONDITIONS  ADDRESSED TODAY: Obesity, Beginning BMI 32.89 BMI 30.0-30.9,adult - Current 30.06 Assessment & Plan: Andrea Bridges is currently taking Wegovy 1.7 mg once weekly. During LOV, dose was increased from 1.0 mg. Pt is not tolerating medication well. She reports nausea which causes not wanting to eat. Andrea Bridges also endorses a lack of control with cravings on higher dose. Andrea Bridges feels the medication worked better with her overall well-being on 1.0 mg once weekly. Per [employer], had to titrate up to maintenacne dose of 1.7 or 2.4 and if she needs a lower dosage the provider needs to submit a quantity limit exception. Asked CMA to do quantity limit exception with insurance so pt can go back to 1 mg. Will refill prescription for 1 mg, encouraged pt to f/up with insurance receive coverage. Will continue to monitor.   Relevant Orders: -     Semaglutide-Weight Management; Inject 1 mg into the skin once a week.  Dispense: 2 mL; Refill: 0   Vitamin D deficiency Assessment & Plan: Andrea Bridges is taking ERGO 50000 once weekly. She has been consistent with supplement. Per last obtained labs on 03/17/2023, Vit D was low at 28.0. Continue current supplementation. Will recheck today.   Relevant Orders: -     Vitamin D (Ergocalciferol); Take 1 capsule (50,000 Units total) by mouth every 7 (seven) days.  Dispense: 4 capsule; Refill: 1 -     VITAMIN D 25 Hydroxy (Vit-D Deficiency, Fractures)   Insulin resistance Assessment & Plan: Andrea Bridges is not currently on any medications for this condition. Diet/exercise controlled. Explained role of simple carbs and insulin levels on hunger and cravings. Andrea Bridges will continue to  work on weight loss, exercise, via their meal plan we devised to help decrease the risk of progressing to diabetes. Will recheck A1c and insulin today.   Relevant Orders: -     Hemoglobin A1c -     Insulin, random   Elevated lipoprotein(a) Assessment & Plan: Lab Results  Component Value Date   CHOL 273 (A)  01/06/2023   HDL 102 (A) 01/06/2023   LDLCALC 158 01/06/2023   TRIG 65 01/06/2023   CHOLHDL 2.6 09/08/2022   Per last obtained labs, Andrea Bridges's HDL is elevated at 102. She is not currently taking any medication, diet/exercise controlled. Andrea Bridges agrees to continue with meds and/or our treatment plan of a heart-heathy, low cholesterol meal plan. Cardiovascular risk and specific lipid/LDL goals reviewed. Will recheck lipid panel today.    Relevant Orders: -     Lipid panel  Follow up:   Return in about 30 days (around 08/24/2023). She was informed of the importance of frequent follow up visits to maximize her success with intensive lifestyle modifications for her multiple health conditions.  Andrea Bridges is aware that we will review all of her lab results at our next visit together in person.  She is aware that if anything is critical/ life threatening with the results, we will be contacting her via MyChart or by my CMA will be calling them prior to the office visit to discuss acute management.     Subjective:   Chief complaint: Obesity Andrea Bridges is here to discuss her progress with her obesity treatment plan. She is on the the Category 1 Plan and states she is following her eating plan approximately 50% of the time. She states she is walking 15 minutes 2 days per week.  Interval History:  Andrea Bridges is here for a follow up office visit. Since last OV on 06/22/2023, Andrea Bridges is down 1 lb. Pt feels she is somewhat adhering to MP. However, Andrea Bridges endorses not having much control over her cravings recently.   Pharmacotherapy for weight loss: She is currently taking  Wegovy 1.7 mg once weekly .   Review of Systems:  Pertinent positives were addressed with patient today.  Reviewed by clinician on day of visit: allergies, medications, problem list, medical history, surgical history, family history, social history, and previous encounter notes.  Weight Summary and Biometrics   Weight Lost Since Last  Visit: 1lb  Weight Gained Since Last Visit: 0   Vitals Temp: (!) 97.4 F (36.3 C) BP: 110/71 Pulse Rate: 72 SpO2: 98 %   Anthropometric Measurements Height: 5\' 7"  (1.702 m) Weight: 192 lb (87.1 kg) BMI (Calculated): 30.06 Weight at Last Visit: 193lb Weight Lost Since Last Visit: 1lb Weight Gained Since Last Visit: 0 Starting Weight: 210lb Total Weight Loss (lbs): 17 lb (7.711 kg)   Body Composition  Body Fat %: 40.6 % Fat Mass (lbs): 78 lbs Muscle Mass (lbs): 108.4 lbs Total Body Water (lbs): 77.2 lbs Visceral Fat Rating : 10   Other Clinical Data Fasting: yes Labs: yes Today's Visit #: 14 Starting Date: 09/09/22    Objective:   PHYSICAL EXAM: Blood pressure 110/71, pulse 72, temperature (!) 97.4 F (36.3 C), height 5\' 7"  (1.702 m), weight 192 lb (87.1 kg), SpO2 98%. Body mass index is 30.07 kg/m.  General: she is overweight, cooperative and in no acute distress. PSYCH: Has normal mood, affect and thought process.   HEENT: EOMI, sclerae are anicteric. Lungs: Normal breathing effort, no conversational dyspnea. Extremities: Moves * 4 Neurologic: A  and O * 3, good insight  DIAGNOSTIC DATA REVIEWED: BMET    Component Value Date/Time   NA 139 03/17/2023 0936   K 5.0 03/17/2023 0936   CL 101 03/17/2023 0936   CO2 24 03/17/2023 0936   GLUCOSE 84 03/17/2023 0936   BUN 21 03/17/2023 0936   CREATININE 0.90 03/17/2023 0936   CALCIUM 10.2 03/17/2023 0936   Lab Results  Component Value Date   HGBA1C 5.6 03/17/2023   HGBA1C 5.5 09/08/2022   Lab Results  Component Value Date   INSULIN 11.0 03/17/2023   INSULIN 9.3 09/08/2022   Lab Results  Component Value Date   TSH 1.280 09/08/2022   CBC    Component Value Date/Time   WBC 5.6 09/08/2022 0854   RBC 5.13 09/08/2022 0854   HGB 13.5 09/08/2022 0854   HCT 43.1 09/08/2022 0854   PLT 219 09/08/2022 0854   MCV 84 09/08/2022 0854   MCH 26.3 (L) 09/08/2022 0854   MCHC 31.3 (L) 09/08/2022 0854   RDW  14.7 09/08/2022 0854   Iron Studies No results found for: "IRON", "TIBC", "FERRITIN", "IRONPCTSAT" Lipid Panel     Component Value Date/Time   CHOL 273 (A) 01/06/2023 0000   CHOL 235 (H) 09/08/2022 0854   TRIG 65 01/06/2023 0000   HDL 102 (A) 01/06/2023 0000   HDL 90 09/08/2022 0854   CHOLHDL 2.6 09/08/2022 0854   LDLCALC 158 01/06/2023 0000   LDLCALC 126 (H) 09/08/2022 0854   Hepatic Function Panel     Component Value Date/Time   PROT 7.0 03/17/2023 0936   ALBUMIN 4.6 03/17/2023 0936   AST 19 03/17/2023 0936   ALT 14 03/17/2023 0936   ALKPHOS 82 03/17/2023 0936   BILITOT 0.3 03/17/2023 0936      Component Value Date/Time   TSH 1.280 09/08/2022 0854   Nutritional Lab Results  Component Value Date   VD25OH 28.0 (L) 03/17/2023   VD25OH 20.9 (L) 09/08/2022    Attestations:   I, Camryn Mix, acting as a Stage manager for [employer], DO., have compiled all relevant documentation for today's office visit on behalf of Thomasene Lot, DO, while in the presence of [employer], DO.  Reviewed by clinician on day of visit: allergies, medications, problem list, medical history, surgical history, family history, social history, and previous encounter notes pertinent to patient's obesity diagnosis. I have spent 40 minutes in the care of the patient today including: preparing to see patient (e.g. review and interpretation of tests, old notes ), obtaining and/or reviewing separately obtained history, performing a medically appropriate examination or evaluation, counseling and educating the patient, ordering medications, test or procedures, documenting clinical information in the electronic or other health care record, and independently interpreting results and communicating results to the patient, family, or caregiver   I have reviewed the above documentation for accuracy and completeness, and I agree with the above. [name], D.O.  The 21st Century Cures Act was  signed into law in 2016 which includes the topic of electronic health records.  This provides immediate access to information in MyChart.  This includes consultation notes, operative notes, office notes, lab results and pathology reports.  If you have any questions about what you read please let us know at your next visit so we can discuss your concerns and take corrective action if need be.  We are right here with you.

## 2023-07-27 ENCOUNTER — Telehealth (INDEPENDENT_AMBULATORY_CARE_PROVIDER_SITE_OTHER): Payer: Self-pay | Admitting: Family Medicine

## 2023-07-27 ENCOUNTER — Other Ambulatory Visit (HOSPITAL_COMMUNITY): Payer: Self-pay

## 2023-07-27 ENCOUNTER — Telehealth (INDEPENDENT_AMBULATORY_CARE_PROVIDER_SITE_OTHER): Payer: Self-pay

## 2023-07-27 LAB — HEMOGLOBIN A1C
Est. average glucose Bld gHb Est-mCnc: 111 mg/dL
Hgb A1c MFr Bld: 5.5 % (ref 4.8–5.6)

## 2023-07-27 LAB — LIPID PANEL
Chol/HDL Ratio: 3 ratio (ref 0.0–4.4)
Cholesterol, Total: 250 mg/dL — ABNORMAL HIGH (ref 100–199)
HDL: 82 mg/dL (ref >39)
LDL Chol Calc (NIH): 151 mg/dL — ABNORMAL HIGH (ref 0–99)
Triglycerides: 100 mg/dL (ref 0–149)
VLDL Cholesterol Cal: 17 mg/dL (ref 5–40)

## 2023-07-27 LAB — VITAMIN D 25 HYDROXY (VIT D DEFICIENCY, FRACTURES): Vit D, 25-Hydroxy: 24.5 ng/mL — ABNORMAL LOW (ref 30.0–100.0)

## 2023-07-27 LAB — INSULIN, RANDOM: INSULIN: 11.3 u[IU]/mL (ref 2.6–24.9)

## 2023-07-27 NOTE — Telephone Encounter (Signed)
 Patient came in for her appointment and this was discussed then.

## 2023-07-27 NOTE — Telephone Encounter (Signed)
 PA questions for [facility]MG   have been answered and all documentation has been included. Waiting on a determination.

## 2023-07-27 NOTE — Telephone Encounter (Signed)
 PA for [facility] 1MG  has been submitted, awaiting PA questions.

## 2023-07-27 NOTE — Telephone Encounter (Signed)
 Good afternoon!  Dr. Val Eagle would like a "limited quantity" on this patient's wegovy. Would like to do the 1.0 dose and was asking to speak to [employer]. She also needs her vitamin D refilled.  She asked for a call back.

## 2023-07-27 NOTE — Telephone Encounter (Signed)
 I called the patient to let her know we are still waiting on a response about her PA, and to let her know that her Vitamin D script was sent in at her visit om 07/25/23 to her pharmacy. I have let her know that as soon as we get a reply, I will be in touch. She understood.

## 2023-07-27 NOTE — Telephone Encounter (Signed)
 Patient called asking that Andrea Bridges give her a call about her Wegovy and Vitamin D. Pt reports Dr. Sharee Holster told her to call today to give an update on her medications. The pt reports the best number for her is [phone].

## 2023-07-28 ENCOUNTER — Telehealth (INDEPENDENT_AMBULATORY_CARE_PROVIDER_SITE_OTHER): Payer: Self-pay

## 2023-07-28 NOTE — Telephone Encounter (Signed)
 Patient called today asking me to call in reference to her PA, I spoke to the patient yesterday once I submitted the PA and explained to her that a decision, and reply, could take anywhere from 24-48 hours. As of the time that I am typing this note, I do not have a decision or reply yet. I called the patient again and left a message telling her I have not gotten a reply, that I had already explained it may take 24-48 hours. I told her I realize we are not open tomorrow, so it may be Monday before I am able to get back to her. I did tell her if I got a response today, I would be in touch today.

## 2023-07-29 ENCOUNTER — Other Ambulatory Visit (HOSPITAL_COMMUNITY): Payer: Self-pay

## 2023-08-04 NOTE — Telephone Encounter (Signed)
 Spoke with patient and she informed of the faxes that has been sent to the office. One was faxed to [place] from [employer] and the patient had also send a copy of the information to our office. I called [employer] [phone] and CSR informed me they were waiting on a response from the office. I completed the form and faxed back to them at [phone] and confirmation received at 11:00 A.M. We are waiting on a response and then patient will be notified.

## 2023-08-05 ENCOUNTER — Other Ambulatory Visit (HOSPITAL_COMMUNITY): Payer: Self-pay

## 2023-08-09 ENCOUNTER — Telehealth (INDEPENDENT_AMBULATORY_CARE_PROVIDER_SITE_OTHER): Payer: Self-pay

## 2023-08-09 ENCOUNTER — Encounter (INDEPENDENT_AMBULATORY_CARE_PROVIDER_SITE_OTHER): Payer: Self-pay

## 2023-08-09 NOTE — Telephone Encounter (Signed)
 Exception Form faxed to [facility] at [phone]. Awaiting a reply.

## 2023-08-10 NOTE — Telephone Encounter (Signed)
 [employer] faxed a second form with a new set of questions to answer. I filled that form out today and faxed it to [phone]. Awaiting a response.

## 2023-08-11 ENCOUNTER — Other Ambulatory Visit (HOSPITAL_COMMUNITY): Payer: Self-pay

## 2023-08-23 LAB — HM MAMMOGRAPHY

## 2023-08-24 ENCOUNTER — Encounter (INDEPENDENT_AMBULATORY_CARE_PROVIDER_SITE_OTHER): Payer: Self-pay | Admitting: Family Medicine

## 2023-08-24 ENCOUNTER — Other Ambulatory Visit (HOSPITAL_COMMUNITY): Payer: Self-pay

## 2023-08-24 ENCOUNTER — Ambulatory Visit (INDEPENDENT_AMBULATORY_CARE_PROVIDER_SITE_OTHER): Admitting: Family Medicine

## 2023-08-24 VITALS — BP 94/60 | HR 78 | Temp 98.2°F | Ht 67.0 in | Wt 192.0 lb

## 2023-08-24 DIAGNOSIS — E669 Obesity, unspecified: Secondary | ICD-10-CM

## 2023-08-24 DIAGNOSIS — Z6830 Body mass index (BMI) 30.0-30.9, adult: Secondary | ICD-10-CM

## 2023-08-24 DIAGNOSIS — E7841 Elevated Lipoprotein(a): Secondary | ICD-10-CM

## 2023-08-24 DIAGNOSIS — E88819 Insulin resistance, unspecified: Secondary | ICD-10-CM | POA: Diagnosis not present

## 2023-08-24 DIAGNOSIS — E559 Vitamin D deficiency, unspecified: Secondary | ICD-10-CM

## 2023-08-24 DIAGNOSIS — Z6832 Body mass index (BMI) 32.0-32.9, adult: Secondary | ICD-10-CM

## 2023-08-24 MED ORDER — SEMAGLUTIDE-WEIGHT MANAGEMENT 1 MG/0.5ML ~~LOC~~ SOAJ
1.0000 mg | SUBCUTANEOUS | 1 refills | Status: DC
  Filled 2023-08-24 – 2023-09-07 (×2): qty 2, 28d supply, fill #0
  Filled 2023-10-11: qty 2, 28d supply, fill #1

## 2023-08-24 MED ORDER — VITAMIN D (ERGOCALCIFEROL) 1.25 MG (50000 UNIT) PO CAPS
50000.0000 [IU] | ORAL_CAPSULE | ORAL | 0 refills | Status: DC
  Filled 2023-08-24: qty 12, 84d supply, fill #0

## 2023-08-24 NOTE — Patient Instructions (Signed)
 The 10-year ASCVD risk score (Arnett DK, et al., 2019) is: 4.1%   Values used to calculate the score:     Age: 63 years     Sex: Female     Is Non-Hispanic African American: No     Diabetic: No     Tobacco smoker: Yes     Systolic Blood Pressure: 94 mmHg     Is BP treated: No     HDL Cholesterol: 82 mg/dL     Total Cholesterol: 250 mg/dL

## 2023-08-24 NOTE — Progress Notes (Signed)
 Rae Bugler, D.O.  ABFM, ABOM Specializing in Clinical Bariatric Medicine  Office located at: [address] [place], [place]  [address]   Assessment and Plan:  No orders of the defined types were placed in this encounter.  Medications Discontinued During This Encounter  Medication Reason   Vitamin D , Ergocalciferol , (DRISDOL ) 1.25 MG (50000 UNIT) CAPS capsule Reorder   Semaglutide -Weight Management 1 MG/0.5ML SOAJ Reorder    Meds ordered this encounter  Medications   Semaglutide -Weight Management 1 MG/0.5ML SOAJ    Sig: Inject 1 mg into the skin once a week.    Dispense:  2 mL    Refill:  1    Pt unable to tolerate 1.7mg  dose d/t nausea   Vitamin D , Ergocalciferol , (DRISDOL ) 1.25 MG (50000 UNIT) CAPS capsule    Sig: Take 1 capsule (50,000 Units total) by mouth every 7 (seven) days.    Dispense:  12 capsule    Refill:  0     FOR THE DISEASE OF OBESITY:  BMI 30.0-30.9,adult - Current 30.06 Obesity, Beginning BMI 32.89 Assessment & Plan: Since last office visit on 07/25/2023, patient's muscle mass has increased by 1.8 lbs. Fat mass has decreased by 1.6 lbs. Total body water has increased by 1.6 lbs.  Counseling done on how various foods will affect these numbers and how to maximize success  Total lbs lost to date: 18 lbs Total weight loss percentage to date: 8.57%    Recommended Dietary Goals Leianna is currently in the action stage of change. As such, her goal is to continue weight management plan.  She has agreed to: continue current plan with 1000 cal and 80+g protein   Behavioral Intervention We discussed the following today: Decreasing saturated/trans fats, quick protein options, and increasing lean protein intake to established goals, avoiding skipping meals, and increasing water intake   Additional resources provided today: Handout on Common Characteristics of Successful Weight Losers,  Evidence-based interventions for health behavior change were  utilized today including the discussion of self monitoring techniques, problem-solving barriers and SMART goal setting techniques.   Regarding patient's less desirable eating habits and patterns, we employed the technique of small changes.   Pt will specifically work on: Journaling meal intake for next visit.    Recommended Physical Activity Goals Dianne has been advised to work up to 300-450 minutes of moderate intensity aerobic activity a week and strengthening exercises 2-3 times per week for cardiovascular health, weight loss maintenance and preservation of muscle mass.   She has agreed to: 15 mins 2-3 days per week of walking and introduce resistance bands in routine   Pharmacotherapy We both agreed to : continue with nutritional and behavioral strategies and current medication regimen   FOR ASSOCIATED CONDITIONS ADDRESSED TODAY:  BMI 30.0-30.9,adult - Current 30.06 Obesity, Beginning BMI 32.89 Assessment & Plan: Liel did is taking Wegovy  1.0 mg once weekly. She did not receive Wegovy  immediately following LOV d/t issues with prior authorization. Dose was decreased from 1.7 mg to 1.0 mg during LOV. Pt started current dose on 04/04. She is tolerating well, denies any GI upset. Continue current medication regimen. Will continue to monitor.  Relevant Orders: -     Semaglutide -Weight Management; Inject 1 mg into the skin once a week.  Dispense: 2 mL; Refill: 1   Insulin  resistance Assessment & Plan: Lab Results  Component Value Date   HGBA1C 5.5 07/25/2023   HGBA1C 5.6 03/17/2023   HGBA1C 5.2 01/06/2023   INSULIN  11.3 07/25/2023  INSULIN  11.0 03/17/2023   INSULIN  9.3 09/08/2022  Odaliz does not take anything for IR. Diet/exercise approach. Reviewed last obtained labs with pt, A1c improved from 5.6 to 5.5. Insulin  levels slightly increased from 11.0 to 11.3 and still about 2 times the goal of <5. Stressed importance of dietary and lifestyle modifications to result in weight loss  as first line txmnt. Anticipatory guidance given. Continue following prudent nutritional plan and exercising regularly to improve insulin  levels. Reminded pt of importance of limiting simple carbs in diet. Will continue to monitor condition.    Elevated lipoprotein(a) Assessment & Plan: Lab Results  Component Value Date   CHOL 250 (H) 07/25/2023   HDL 82 07/25/2023   LDLCALC 151 (H) 07/25/2023   TRIG 100 07/25/2023   CHOLHDL 3.0 07/25/2023  The 10-year ASCVD risk score (Arnett DK, et al., 2019) is: 4.1%   Values used to calculate the score:     Age: 89 years     Sex: Female     Is Non-Hispanic African American: No     Diabetic: No     Tobacco smoker: Yes     Systolic Blood Pressure: 94 mmHg     Is BP treated: No     HDL Cholesterol: 82 mg/dL     Total Cholesterol: 250 mg/dL Reviewed last obtained labs with pt, 7 months ago LDL slightly improved from 158 to 151 but was still significantly elevated. Pt is a current smoker, specifically electronic nicotine. Per her ascvd score, she does not need medications at this time. However, pt was informed that once this gets over 5% she may need medication to control this condition. Recommended pt consult with PCP about obtaining a coronary calcium score to observe any possible blockages. Encouraged Amalia Badder to visit [email or web address].org for more information about lipid levels and cholesterol. Recommended smoking cessation to lower ascvd risk. Continue to avoid bad fats such as fatty meats, butter, and cheese. Will continue to monitor condition closely.    Vitamin D  deficiency Assessment & Plan: Lab Results  Component Value Date   VD25OH 24.5 (L) 07/25/2023  Crystalle is on high dose prescription Vit D once weekly. Reviewed labs obtained during LOV, Vit D levels were below goal of 50-70. Pt admits to missing doses of supplement which may have caused low levels. Stressed importance of optimal levels for energy, mood, and overall well-being. Informed pt that low  Vitamin D  levels may be linked to an increased risk of cardiovascular events and even increased risk of cancers. If levels do not improve at next check, will make change in dose of supplement. Will continue to regularly monitor levels.   Relevant Orders: -     Vitamin D  (Ergocalciferol ); Take 1 capsule (50,000 Units total) by mouth every 7 (seven) days.  Dispense: 12 capsule; Refill: 0  Follow up:   Return in about 8 weeks (around 10/19/2023). She was informed of the importance of frequent follow up visits to maximize her success with intensive lifestyle modifications for her multiple health conditions.  Subjective:   Chief complaint: Obesity Shye is here to discuss her progress with her obesity treatment plan. She is on the the Category 1 Plan and states she is following her eating plan approximately 50% of the time. She states she is walking 10 minutes 2 days per week.  Interval History:  Jackquline Branca is here for a follow up office visit. Since last OV on 07/25/2023, pt's weight has not changed. She endorses walking on the treadmill once but  has been occasionally walking around her workplace. An example breakfast for Joene includes greek yogurt with added protein powder. Pt endorses eating lunch in the early afternoon and won't eat dinner until the late evening. Additionally, her biggest obstacle is snacking.   Pharmacotherapy for weight loss: She is currently taking Wegovy  1 mg once weekly.   Review of Systems:  Pertinent positives were addressed with patient today.  Reviewed by clinician on day of visit: allergies, medications, problem list, medical history, surgical history, family history, social history, and previous encounter notes.  Weight Summary and Biometrics   Weight Lost Since Last Visit: 0  Weight Gained Since Last Visit: 0   Vitals Temp: 98.2 F (36.8 C) BP: 94/60 Pulse Rate: 78 SpO2: 99 %   Anthropometric Measurements Height: 5\' 7"  (1.702 m) Weight: 192 lb  (87.1 kg) BMI (Calculated): 30.06 Weight at Last Visit: 192 lb Weight Lost Since Last Visit: 0 Weight Gained Since Last Visit: 0 Starting Weight: 210 lb Total Weight Loss (lbs): 18 lb (8.165 kg)   Body Composition  Body Fat %: 39.7 % Fat Mass (lbs): 76.4 lbs Muscle Mass (lbs): 110.2 lbs Total Body Water (lbs): 78.8 lbs Visceral Fat Rating : 10   Other Clinical Data Fasting: no Labs: no Today's Visit #: 15 Starting Date: 09/09/22    Objective:   PHYSICAL EXAM: Blood pressure 94/60, pulse 78, temperature 98.2 F (36.8 C), height 5\' 7"  (1.702 m), weight 192 lb (87.1 kg), SpO2 99%. Body mass index is 30.07 kg/m.  General: she is overweight, cooperative and in no acute distress. PSYCH: Has normal mood, affect and thought process.   HEENT: EOMI, sclerae are anicteric. Lungs: Normal breathing effort, no conversational dyspnea. Extremities: Moves * 4 Neurologic: A and O * 3, good insight  DIAGNOSTIC DATA REVIEWED: BMET    Component Value Date/Time   NA 139 03/17/2023 0936   K 5.0 03/17/2023 0936   CL 101 03/17/2023 0936   CO2 24 03/17/2023 0936   GLUCOSE 84 03/17/2023 0936   BUN 21 03/17/2023 0936   CREATININE 0.90 03/17/2023 0936   CALCIUM 10.2 03/17/2023 0936   Lab Results  Component Value Date   HGBA1C 5.5 07/25/2023   HGBA1C 5.5 09/08/2022   Lab Results  Component Value Date   INSULIN  11.3 07/25/2023   INSULIN  9.3 09/08/2022   Lab Results  Component Value Date   TSH 1.280 09/08/2022   CBC    Component Value Date/Time   WBC 5.6 09/08/2022 0854   RBC 5.13 09/08/2022 0854   HGB 13.5 09/08/2022 0854   HCT 43.1 09/08/2022 0854   PLT 219 09/08/2022 0854   MCV 84 09/08/2022 0854   MCH 26.3 (L) 09/08/2022 0854   MCHC 31.3 (L) 09/08/2022 0854   RDW 14.7 09/08/2022 0854   Iron Studies No results found for: "IRON", "TIBC", "FERRITIN", "IRONPCTSAT" Lipid Panel     Component Value Date/Time   CHOL 250 (H) 07/25/2023 1023   TRIG 100 07/25/2023 1023    HDL 82 07/25/2023 1023   CHOLHDL 3.0 07/25/2023 1023   LDLCALC 151 (H) 07/25/2023 1023   Hepatic Function Panel     Component Value Date/Time   PROT 7.0 03/17/2023 0936   ALBUMIN 4.6 03/17/2023 0936   AST 19 03/17/2023 0936   ALT 14 03/17/2023 0936   ALKPHOS 82 03/17/2023 0936   BILITOT 0.3 03/17/2023 0936      Component Value Date/Time   TSH 1.280 09/08/2022 0854   Nutritional Lab Results  Component Value Date   VD25OH 24.5 (L) 07/25/2023   VD25OH 28.0 (L) 03/17/2023   VD25OH 20.9 (L) 09/08/2022    Attestations:   I, Camryn Mix, acting as a Stage manager for [employer], DO., have compiled all relevant documentation for today's office visit on behalf of Marceil Sensor, DO, while in the presence of [employer], DO.  I have reviewed the above documentation for accuracy and completeness, and I agree with the above. Rae Bugler, D.O.  The 21st Century Cures Act was signed into law in 2016 which includes the topic of electronic health records.  This provides immediate access to information in MyChart.  This includes consultation notes, operative notes, office notes, lab results and pathology reports.  If you have any questions about what you read please let us  know at your next visit so we can discuss your concerns and take corrective action if need be.  We are right here with you.

## 2023-08-25 ENCOUNTER — Other Ambulatory Visit (HOSPITAL_COMMUNITY): Payer: Self-pay

## 2023-09-07 ENCOUNTER — Other Ambulatory Visit (HOSPITAL_COMMUNITY): Payer: Self-pay

## 2023-09-22 ENCOUNTER — Ambulatory Visit (INDEPENDENT_AMBULATORY_CARE_PROVIDER_SITE_OTHER): Payer: No Typology Code available for payment source | Admitting: Family Medicine

## 2023-09-22 ENCOUNTER — Encounter: Payer: Self-pay | Admitting: Family Medicine

## 2023-09-22 VITALS — BP 102/68 | HR 74 | Temp 97.8°F | Ht 67.0 in | Wt 195.6 lb

## 2023-09-22 DIAGNOSIS — F17210 Nicotine dependence, cigarettes, uncomplicated: Secondary | ICD-10-CM

## 2023-09-22 DIAGNOSIS — Z114 Encounter for screening for human immunodeficiency virus [HIV]: Secondary | ICD-10-CM

## 2023-09-22 DIAGNOSIS — Z1159 Encounter for screening for other viral diseases: Secondary | ICD-10-CM

## 2023-09-22 DIAGNOSIS — Z122 Encounter for screening for malignant neoplasm of respiratory organs: Secondary | ICD-10-CM

## 2023-09-22 DIAGNOSIS — Z23 Encounter for immunization: Secondary | ICD-10-CM

## 2023-09-22 DIAGNOSIS — Z0000 Encounter for general adult medical examination without abnormal findings: Secondary | ICD-10-CM | POA: Diagnosis not present

## 2023-09-22 LAB — CBC
HCT: 41 % (ref 36.0–46.0)
Hemoglobin: 13.6 g/dL (ref 12.0–15.0)
MCHC: 33.3 g/dL (ref 30.0–36.0)
MCV: 80 fl (ref 78.0–100.0)
Platelets: 186 10*3/uL (ref 150.0–400.0)
RBC: 5.12 Mil/uL — ABNORMAL HIGH (ref 3.87–5.11)
RDW: 15.4 % (ref 11.5–15.5)
WBC: 5.6 10*3/uL (ref 4.0–10.5)

## 2023-09-22 LAB — COMPREHENSIVE METABOLIC PANEL WITH GFR
ALT: 10 U/L (ref 0–35)
AST: 15 U/L (ref 0–37)
Albumin: 4.4 g/dL (ref 3.5–5.2)
Alkaline Phosphatase: 71 U/L (ref 39–117)
BUN: 19 mg/dL (ref 6–23)
CO2: 27 meq/L (ref 19–32)
Calcium: 9.2 mg/dL (ref 8.4–10.5)
Chloride: 103 meq/L (ref 96–112)
Creatinine, Ser: 0.82 mg/dL (ref 0.40–1.20)
GFR: 76.37 mL/min (ref >60.00)
Glucose, Bld: 82 mg/dL (ref 70–99)
Potassium: 4.4 meq/L (ref 3.5–5.1)
Sodium: 138 meq/L (ref 135–145)
Total Bilirubin: 0.4 mg/dL (ref 0.2–1.2)
Total Protein: 6.7 g/dL (ref 6.0–8.3)

## 2023-09-22 LAB — TSH: TSH: 1.66 u[IU]/mL (ref 0.35–5.50)

## 2023-09-22 NOTE — Patient Instructions (Addendum)
 Return in about 1 year (around 09/22/2024) for cpe (20 min).   nurse visit in 3 months for shingrix #2     Great to see you today.  I have refilled the medication(s) we provide.   If labs were collected or images ordered, we will inform you of  results once we have received them and reviewed. We will contact you either by echart message, or telephone call.  Please give ample time to the testing facility, and our office to run,  receive and review results. Please do not call inquiring of results, even if you can see them in your chart. We will contact you as soon as we are able. If it has been over 1 week since the test was completed, and you have not yet heard from us , then please call us .    - echart message- for normal results that have been seen by the patient already.   - telephone call: abnormal results or if patient has not viewed results in their echart.  If a referral to a specialist was entered for you, please call us  in 2 weeks if you have not heard from the specialist office to schedule.

## 2023-09-22 NOTE — Progress Notes (Unsigned)
 Patient ID: Andrea Bridges, female  DOB: 25-May-1960, 63 y.o.   MRN: [id number] Patient Care Team    Relationship Specialty Notifications Start End  Mariel Shope, DO PCP - General Family Medicine  06/08/23   Ob/Gyn, [facility]    06/08/23   Alvis Jourdain, MD Consulting Physician Gastroenterology  06/09/23     Chief Complaint  Patient presents with   Annual Exam    Subjective:  Andrea Bridges is a 63 y.o.  female present for CPE All past medical history, surgical history, allergies, family history, immunizations, medications and social history were updated in the electronic medical record today. All recent labs, ED visits and hospitalizations within the last year were reviewed.  Health maintenance:  Colonoscopy: Completed 2019, 7-year repeat Dr. Nickey Barn Mammogram: Completed 08/2022 [facility],requested Cervical cancer screening: Completed 08/2021-OB/GY[facility]-repeat 5 years Immunizations: Tdap UTD 2018, influenza encouraged yearly, pneumococcal 20 recommended after 65, Shingrix #1 provided today. nurse visit in 3 months for shingrix #2 Infectious disease screening: HIV declined, hepatitis C screening completed today   Shared decision making visit for lung cancer screening: Patient was brought in today for a office visit concerning shared decision making for their lung cancer screening.Andrea Bridges is a 63 y.o. female Patient is between the ages of 19-75: Yes Patient is a current smoker with at least 20 year pack year history or Patient is a former smoker, quit less than 15 years ago and has a 20 pack year history : Yes Patient has current symptoms: No Patient has a  health problem that substantially limits life expectancy or the ability or willingness to have curative lung surgery: No     09/22/2023    7:46 AM 06/08/2023    9:53 AM  Depression screen PHQ 2/9  Decreased Interest 1 1  Down, Depressed, Hopeless 0 0  PHQ - 2 Score 1 1  Altered sleeping 0 0  Tired,  decreased energy 1 1  Change in appetite 2   Feeling bad or failure about yourself  0 0  Trouble concentrating 0 0  Moving slowly or fidgety/restless 0 0  Suicidal thoughts 0 0  PHQ-9 Score 4 2  Difficult doing work/chores Not difficult at all Not difficult at all      09/22/2023    7:46 AM 06/08/2023    9:54 AM  GAD 7 : Generalized Anxiety Score  Nervous, Anxious, on Edge 1 1  Control/stop worrying 1 0  Worry too much - different things 1 1  Trouble relaxing 1 0  Restless 0 0  Easily annoyed or irritable 1 1  Afraid - awful might happen 0 0  Total GAD 7 Score 5 3  Anxiety Difficulty Not difficult at all Not difficult at all           09/22/2023    7:46 AM 06/08/2023    9:53 AM  Fall Risk   Falls in the past year? 0 0  Number falls in past yr:  0  Injury with Fall?  0  Risk for fall due to :  No Fall Risks  Follow up Falls evaluation completed Falls evaluation completed     Immunization History  Administered Date(s) Administered   Hep B, Unspecified 02/08/2007   Hepatitis A, Adult 02/08/2007   Influenza Inj Mdck Quad Pf 05/25/2017   Influenza, Mdck, Trivalent,PF 6+ MOS(egg free) 03/18/2014   Influenza, Quadrivalent, Recombinant, Inj, Pf 03/16/2018   Influenza-Unspecified 01/08/2013, 02/08/2023   MMR 11/08/1966  Tdap 11/08/1967, 06/08/2023   Varicella 12/09/1966    No results found.  Past Medical History:  Diagnosis Date   Acute appendicitis with perforation and localized peritonitis, without abscess or gangrene 12/11/2019   Anxiety    Infertility, female    PCOS (polycystic ovarian syndrome)    Skin cancer    basal cell   SOBOE (shortness of breath on exertion) 09/08/2022   No Known Allergies Past Surgical History:  Procedure Laterality Date   APPENDECTOMY  2021   BREAST BIOPSY     Family History  Problem Relation Age of Onset   Thyroid disease Mother    Anxiety disorder Mother    Obesity Mother    Cancer Father        skin   Sleep apnea  Father    Social History   Social History Narrative   Marital status/children/pets: Divorced, 1 child   Education/employment: Some college.  Works as a Education administrator:      -smoke alarm in the home:Yes     - wears seatbelt: Yes     - Feels safe in their relationships: Yes       Allergies as of 09/22/2023   No Known Allergies      Medication List        Accurate as of Sep 22, 2023 12:03 PM. If you have any questions, ask your nurse or doctor.          Vitamin D  (Ergocalciferol ) 1.25 MG (50000 UNIT) Caps capsule Commonly known as: DRISDOL  Take 1 capsule (50,000 Units total) by mouth every 7 (seven) days.   Wegovy  1 MG/0.5ML Soaj Generic drug: Semaglutide -Weight Management Inject 1 mg into the skin once a week.        All past medical history, surgical history, allergies, family history, immunizations andmedications were updated in the EMR today and reviewed under the history and medication portions of their EMR.       ROS 14 pt review of systems performed and negative (unless mentioned in an HPI)  Objective: BP 102/68   Pulse 74   Temp 97.8 F (36.6 C)   Ht 5\' 7"  (1.702 m)   Wt 195 lb 9.6 oz (88.7 kg)   LMP  (LMP Unknown)   SpO2 95%   BMI 30.64 kg/m  Physical Exam Vitals and nursing note reviewed.  Constitutional:      General: She is not in acute distress.    Appearance: Normal appearance. She is not ill-appearing or toxic-appearing.  HENT:     Head: Normocephalic and atraumatic.     Right Ear: Tympanic membrane, ear canal and external ear normal. There is no impacted cerumen.     Left Ear: Tympanic membrane, ear canal and external ear normal. There is no impacted cerumen.     Nose: No congestion or rhinorrhea.     Mouth/Throat:     Mouth: Mucous membranes are moist.     Pharynx: Oropharynx is clear. No oropharyngeal exudate or posterior oropharyngeal erythema.  Eyes:     General: No scleral icterus.       Right eye: No discharge.         Left eye: No discharge.     Extraocular Movements: Extraocular movements intact.     Conjunctiva/sclera: Conjunctivae normal.     Pupils: Pupils are equal, round, and reactive to light.  Cardiovascular:     Rate and Rhythm: Normal rate and regular rhythm.     Pulses: Normal pulses.  Heart sounds: Normal heart sounds. No murmur heard.    No friction rub. No gallop.  Pulmonary:     Effort: Pulmonary effort is normal. No respiratory distress.     Breath sounds: Normal breath sounds. No stridor. No wheezing, rhonchi or rales.  Chest:     Chest wall: No tenderness.  Abdominal:     General: Abdomen is flat. Bowel sounds are normal. There is no distension.     Palpations: Abdomen is soft. There is no mass.     Tenderness: There is no abdominal tenderness. There is no right CVA tenderness, left CVA tenderness, guarding or rebound.     Hernia: No hernia is present.  Musculoskeletal:        General: No swelling, tenderness or deformity. Normal range of motion.     Cervical back: Normal range of motion and neck supple. No rigidity or tenderness.     Right lower leg: No edema.     Left lower leg: No edema.  Lymphadenopathy:     Cervical: No cervical adenopathy.  Skin:    General: Skin is warm and dry.     Coloration: Skin is not jaundiced or pale.     Findings: No bruising, erythema, lesion or rash.  Neurological:     General: No focal deficit present.     Mental Status: She is alert and oriented to person, place, and time. Mental status is at baseline.     Cranial Nerves: No cranial nerve deficit.     Sensory: No sensory deficit.     Motor: No weakness.     Coordination: Coordination normal.     Gait: Gait normal.     Deep Tendon Reflexes: Reflexes normal.  Psychiatric:        Mood and Affect: Mood normal.        Behavior: Behavior normal.        Thought Content: Thought content normal.        Judgment: Judgment normal.      Assessment/plan: Maxwell Oder is a 63 y.o.  female present for annual physical Routine general medical examination at a health care facility (Primary) - CBC - Comp Met (CMET) - TSH Patient was encouraged to exercise greater than 150 minutes a week. Patient was encouraged to choose a diet filled with fresh fruits and vegetables, and lean meats. AVS provided to patient today for education/recommendation on gender specific health and safety maintenance. Colonoscopy: Completed 2019, 7-year repeat Dr. Nickey Barn Mammogram: Completed 08/2022 [facility],requested Cervical cancer screening: Completed 08/2021-OB[facility]GY[facility]-repeat 5 years Immunizations: Tdap UTD 2018, influenza encouraged yearly, pneumococcal 20 recommended after 65, Shingrix #1 provided today. nurse visit in 3 months for shingrix #2 Infectious disease screening: HIV declined, hepatitis C screening completed today Need for zoster vaccination - Varicella-zoster vaccine IM Encounter for hepatitis C screening test for low risk patient - Hepatitis C Antibody  Lung cancer screening/greater than 20 pck yr history current smoker -Patient was counseled on lung cancer screening today.  Patient does meet criteria for lung cancer screening.  Patient would like to proceed with lung cancer screening.  Patient understands screening may warrant further studies or repeat studies if any abnormality is found.  Patient is agreeable. -Patient has had a recent kidney function test:Yes - CT CHEST LUNG CA SCREEN LOW DOSE W/O CM; Future - Follow-up upon screening results.      Return in about 1 year (around 09/22/2024) for cpe (20 min).  Orders Placed This Encounter  Procedures   CT CHEST LUNG CA SCREEN LOW DOSE W/O CM   Varicella-zoster vaccine IM   CBC   Comp Met (CMET)   TSH   Hepatitis C Antibody   No orders of the defined types were placed in this encounter.  Referral Orders  No referral(s) requested today     Note is dictated utilizing voice recognition software.  Although note has been proof read prior to signing, occasional typographical errors still can be missed. If any questions arise, please do not hesitate to call for verification.  Electronically signed by: Napolean Backbone, DO [facility]

## 2023-09-23 ENCOUNTER — Ambulatory Visit: Payer: Self-pay | Admitting: Family Medicine

## 2023-09-23 LAB — HEPATITIS C ANTIBODY: Hepatitis C Ab: NONREACTIVE

## 2023-10-19 ENCOUNTER — Other Ambulatory Visit (HOSPITAL_COMMUNITY): Payer: Self-pay

## 2023-10-19 ENCOUNTER — Ambulatory Visit (INDEPENDENT_AMBULATORY_CARE_PROVIDER_SITE_OTHER): Admitting: Family Medicine

## 2023-10-19 ENCOUNTER — Encounter (INDEPENDENT_AMBULATORY_CARE_PROVIDER_SITE_OTHER): Payer: Self-pay | Admitting: Family Medicine

## 2023-10-19 VITALS — BP 105/65 | HR 80 | Temp 97.7°F | Ht 67.0 in | Wt 191.0 lb

## 2023-10-19 DIAGNOSIS — F5089 Other specified eating disorder: Secondary | ICD-10-CM | POA: Diagnosis not present

## 2023-10-19 DIAGNOSIS — Z6829 Body mass index (BMI) 29.0-29.9, adult: Secondary | ICD-10-CM | POA: Diagnosis not present

## 2023-10-19 DIAGNOSIS — E669 Obesity, unspecified: Secondary | ICD-10-CM

## 2023-10-19 DIAGNOSIS — E559 Vitamin D deficiency, unspecified: Secondary | ICD-10-CM | POA: Diagnosis not present

## 2023-10-19 MED ORDER — VITAMIN D (ERGOCALCIFEROL) 1.25 MG (50000 UNIT) PO CAPS
50000.0000 [IU] | ORAL_CAPSULE | ORAL | 0 refills | Status: DC
  Filled 2023-10-19 – 2023-11-16 (×2): qty 12, 84d supply, fill #0

## 2023-10-19 MED ORDER — WEGOVY 1.7 MG/0.75ML ~~LOC~~ SOAJ
1.7000 mg | SUBCUTANEOUS | 1 refills | Status: DC
  Filled 2023-10-19: qty 3, 28d supply, fill #0

## 2023-10-19 NOTE — Progress Notes (Signed)
 Andrea Bridges, D.O.  ABFM, ABOM Specializing in Clinical Bariatric Medicine  Office located at: [address] [place], [place]  [address]   Assessment and Plan:  No orders of the defined types were placed in this encounter.  Medications Discontinued During This Encounter  Medication Reason   Semaglutide -Weight Management 1 MG/0.5ML SOAJ    Vitamin D , Ergocalciferol , (DRISDOL ) 1.25 MG (50000 UNIT) CAPS capsule Reorder    Meds ordered this encounter  Medications   Vitamin D , Ergocalciferol , (DRISDOL ) 1.25 MG (50000 UNIT) CAPS capsule    Sig: Take 1 capsule (50,000 Units total) by mouth every 7 (seven) days.    Dispense:  12 capsule    Refill:  0   Semaglutide -Weight Management (WEGOVY ) 1.7 MG/0.75ML SOAJ    Sig: Inject 1.7 mg into the skin once a week.    Dispense:  3 mL    Refill:  1    FOR THE DISEASE OF OBESITY: BMI 29.0-29.9,adult -- Current BMI 29.91 Obesity, Beginning BMI 32.89 Assessment & Plan: Since last office visit on 08/24/23 patient's muscle mass has decreased by 1 lb. Fat mass has decreased by 0.4 lbs. Total body water has decreased by 0.6 lbs.  Counseling done on how various foods will affect these numbers and how to maximize success  Total lbs lost to date: 19 lbs Total weight loss percentage to date: -9.05%    Recommended Dietary Goals Andrea Bridges is currently in the action stage of change. As such, her goal is to continue weight management plan.  She has agreed to: continue current plan   Behavioral Intervention We discussed the following today: increasing lean protein intake to established goals, avoiding skipping meals, increasing water intake , keeping healthy foods at home, decreasing eating out or consumption of processed foods, and making healthy choices when eating convenient foods, and work on managing stress, creating time for self-care and relaxation  Additional resources provided today: None  Evidence-based interventions for health  behavior change were utilized today including the discussion of self monitoring techniques, problem-solving barriers and SMART goal setting techniques.   Regarding patient's less desirable eating habits and patterns, we employed the technique of small changes.   Pt will specifically work on: n/a   Recommended Physical Activity Goals Andrea Bridges has been advised to work up to 300-450 minutes of moderate intensity aerobic activity a week and strengthening exercises 2-3 times per week for cardiovascular health, weight loss maintenance and preservation of muscle mass.   She has agreed to: Continue current level of physical activity    Pharmacotherapy Compliant with Wegovy   1 mg once weekly. Tolerating overall well. Reviewed potential risks/side effects with increasing Wegovy  dose, including GI upset. Encouraged Andrea Bridges to follow her meal plan closely to avoid these possible side effects. Prioritize lean protein intake and avoid skipping meals. PT verbalized understanding/agreement to this plan. Mutually agreed to increase Wegovy  today.   We both agreed to: Continue with current nutritional and behavioral strategies and INCREASE Wegovy  to 1.7 mg once weekly.    ASSOCIATED CONDITIONS ADDRESSED TODAY: Other Specified Feeding or Eating Disorder, Emotional Eating Behaviors Assessment & Plan: Overall she has been working on prioritizing her mental health. No meds currently. Denies any SI/HI. Although her work stress has improved she reports increased stress due to recent changes to her current co-parenting situation.  Follows up with her talk therapist every 2 weeks. She has also found a life coach online, which has helped her with deep breathing exercises. Discussed how uncontrolled moods  can trigger emotional eating. Reviewed the benefits of stress management strategies such as the Calm app, breathing exercises (4-7-8 method), and meditation. Continue to follow up with talk. Therapist in life coach, as  instructed by them. Will continue monitoring.   Vitamin D  deficiency Assessment & Plan: Lab Results  Component Value Date   VD25OH 24.5 (L) 07/25/2023   VD25OH 28.0 (L) 03/17/2023   VD25OH 20.9 (L) 09/08/2022   Compliant with ERGO 50K units once weekly. Tolerating well, no side effects. No acute concerns. Continue vitamin D  supplementation as prescribed. Will refill ERGO today, no dose changes.  Orders: - Refill ERGO, no changes   Follow up:   Return in about 5 weeks (around 11/23/2023) for 4-5 week f/u.  She was informed of the importance of frequent follow up visits to maximize her success with intensive lifestyle modifications for her multiple health conditions.  Subjective:   Chief complaint: Obesity Andrea Bridges is here to discuss her progress with her obesity treatment plan. She is on the Category 1 Plan and states she is following her eating plan approximately 40% of the time. She states she is walking 60 minutes 2 days per week.   Interval History:  Andrea Bridges is here for a follow up office visit. Since last OV on 08/24/23, she lost 1 lb. Reports increased stress. Has been working on improving her stress management strategies. She reports having a CT for lung cancer screening done 10/14/23 at [facility]. Pt started smoking at 63 y/o and still smoking/vaping.   Pharmacotherapy for weight loss: She is currently taking Wegovy  with adequate clinical response  and without side effects..   Review of Systems:  Pertinent positives were addressed with patient today.  Reviewed by clinician on day of visit: allergies, medications, problem list, medical history, surgical history, family history, social history, and previous encounter notes.  Weight Summary and Biometrics   Weight Lost Since Last Visit: 1lb  Weight Gained Since Last Visit: 0lb   Vitals Temp: 97.7 F (36.5 C) BP: 105/65 Pulse Rate: 80 SpO2: 98 %   Anthropometric Measurements Height: 5' 7 (1.702 m) Weight: 191  lb (86.6 kg) BMI (Calculated): 29.91 Weight at Last Visit: 192lb Weight Lost Since Last Visit: 1lb Weight Gained Since Last Visit: 0lb Starting Weight: 210lb Total Weight Loss (lbs): 19 lb (8.618 kg)   Body Composition  Body Fat %: 39.9 % Fat Mass (lbs): 76.2 lbs Muscle Mass (lbs): 109.2 lbs Total Body Water (lbs): 78.2 lbs Visceral Fat Rating : 10   Other Clinical Data Fasting: No Labs: no Today's Visit #: 16 Starting Date: 09/09/22    Objective:   PHYSICAL EXAM: Blood pressure 105/65, pulse 80, temperature 97.7 F (36.5 C), height 5' 7 (1.702 m), weight 191 lb (86.6 kg), SpO2 98%. Body mass index is 29.91 kg/m.  General: she is overweight, cooperative and in no acute distress. PSYCH: Has normal mood, affect and thought process.   HEENT: EOMI, sclerae are anicteric. Lungs: Normal breathing effort, no conversational dyspnea. Extremities: Moves * 4 Neurologic: A and O * 3, good insight  DIAGNOSTIC DATA REVIEWED: BMET    Component Value Date/Time   NA 138 09/22/2023 0807   NA 139 03/17/2023 0936   K 4.4 09/22/2023 0807   CL 103 09/22/2023 0807   CO2 27 09/22/2023 0807   GLUCOSE 82 09/22/2023 0807   BUN 19 09/22/2023 0807   BUN 21 03/17/2023 0936   CREATININE 0.82 09/22/2023 0807   CALCIUM 9.2 09/22/2023 0807  Lab Results  Component Value Date   HGBA1C 5.5 07/25/2023   HGBA1C 5.5 09/08/2022   Lab Results  Component Value Date   INSULIN  11.3 07/25/2023   INSULIN  9.3 09/08/2022   Lab Results  Component Value Date   TSH 1.66 09/22/2023   CBC    Component Value Date/Time   WBC 5.6 09/22/2023 0807   RBC 5.12 (H) 09/22/2023 0807   HGB 13.6 09/22/2023 0807   HGB 13.5 09/08/2022 0854   HCT 41.0 09/22/2023 0807   HCT 43.1 09/08/2022 0854   PLT 186.0 09/22/2023 0807   PLT 219 09/08/2022 0854   MCV 80.0 09/22/2023 0807   MCV 84 09/08/2022 0854   MCH 26.3 (L) 09/08/2022 0854   MCHC 33.3 09/22/2023 0807   RDW 15.4 09/22/2023 0807   RDW 14.7  09/08/2022 0854   Iron Studies No results found for: IRON, TIBC, FERRITIN, IRONPCTSAT Lipid Panel     Component Value Date/Time   CHOL 250 (H) 07/25/2023 1023   TRIG 100 07/25/2023 1023   HDL 82 07/25/2023 1023   CHOLHDL 3.0 07/25/2023 1023   LDLCALC 151 (H) 07/25/2023 1023   Hepatic Function Panel     Component Value Date/Time   PROT 6.7 09/22/2023 0807   PROT 7.0 03/17/2023 0936   ALBUMIN 4.4 09/22/2023 0807   ALBUMIN 4.6 03/17/2023 0936   AST 15 09/22/2023 0807   ALT 10 09/22/2023 0807   ALKPHOS 71 09/22/2023 0807   BILITOT 0.4 09/22/2023 0807   BILITOT 0.3 03/17/2023 0936      Component Value Date/Time   TSH 1.66 09/22/2023 0807   Nutritional Lab Results  Component Value Date   VD25OH 24.5 (L) 07/25/2023   VD25OH 28.0 (L) 03/17/2023   VD25OH 20.9 (L) 09/08/2022    Attestations:   Cherryl Corona, acting as a medical scribe for Marceil Sensor, DO., have compiled all relevant documentation for today's office visit on behalf of Marceil Sensor, DO, while in the presence of [employer], DO.  Reviewed by clinician on day of visit: allergies, medications, problem list, medical history, surgical history, family history, social history, and previous encounter notes pertinent to patient's obesity diagnosis.  I have reviewed the above documentation for accuracy and completeness, and I agree with the above. Andrea Bridges, D.O.  The 21st Century Cures Act was signed into law in 2016 which includes the topic of electronic health records.  This provides immediate access to information in MyChart.  This includes consultation notes, operative notes, office notes, lab results and pathology reports.  If you have any questions about what you read please let us  know at your next visit so we can discuss your concerns and take corrective action if need be.  We are right here with you.

## 2023-10-20 ENCOUNTER — Encounter: Payer: Self-pay | Admitting: Family Medicine

## 2023-10-20 DIAGNOSIS — U070 Vaping-related disorder: Secondary | ICD-10-CM

## 2023-10-20 DIAGNOSIS — R9389 Abnormal findings on diagnostic imaging of other specified body structures: Secondary | ICD-10-CM

## 2023-10-20 DIAGNOSIS — R911 Solitary pulmonary nodule: Secondary | ICD-10-CM

## 2023-10-21 DIAGNOSIS — R911 Solitary pulmonary nodule: Secondary | ICD-10-CM | POA: Insufficient documentation

## 2023-10-25 ENCOUNTER — Other Ambulatory Visit (HOSPITAL_COMMUNITY): Payer: Self-pay

## 2023-11-01 ENCOUNTER — Telehealth: Payer: Self-pay | Admitting: Family Medicine

## 2023-11-01 DIAGNOSIS — R911 Solitary pulmonary nodule: Secondary | ICD-10-CM

## 2023-11-01 NOTE — Telephone Encounter (Signed)
 Mychart message sent.

## 2023-11-16 ENCOUNTER — Other Ambulatory Visit (HOSPITAL_COMMUNITY): Payer: Self-pay

## 2023-11-20 ENCOUNTER — Emergency Department (HOSPITAL_COMMUNITY)

## 2023-11-20 ENCOUNTER — Inpatient Hospital Stay (HOSPITAL_COMMUNITY)

## 2023-11-20 ENCOUNTER — Inpatient Hospital Stay (HOSPITAL_COMMUNITY)
Admission: EM | Admit: 2023-11-20 | Discharge: 2023-12-09 | DRG: 024 | Disposition: A | Discharge status: Rehabilitation Facility | Attending: Internal Medicine | Admitting: Internal Medicine

## 2023-11-20 DIAGNOSIS — Z79899 Other long term (current) drug therapy: Secondary | ICD-10-CM | POA: Diagnosis not present

## 2023-11-20 DIAGNOSIS — T859XXA Unspecified complication of internal prosthetic device, implant and graft, initial encounter: Secondary | ICD-10-CM | POA: Diagnosis not present

## 2023-11-20 DIAGNOSIS — Z6831 Body mass index (BMI) 31.0-31.9, adult: Secondary | ICD-10-CM

## 2023-11-20 DIAGNOSIS — R338 Other retention of urine: Secondary | ICD-10-CM | POA: Diagnosis not present

## 2023-11-20 DIAGNOSIS — G9340 Encephalopathy, unspecified: Secondary | ICD-10-CM | POA: Diagnosis not present

## 2023-11-20 DIAGNOSIS — R509 Fever, unspecified: Secondary | ICD-10-CM | POA: Diagnosis not present

## 2023-11-20 DIAGNOSIS — M47819 Spondylosis without myelopathy or radiculopathy, site unspecified: Secondary | ICD-10-CM | POA: Diagnosis not present

## 2023-11-20 DIAGNOSIS — E282 Polycystic ovarian syndrome: Secondary | ICD-10-CM | POA: Diagnosis present

## 2023-11-20 DIAGNOSIS — E66811 Obesity, class 1: Secondary | ICD-10-CM | POA: Diagnosis present

## 2023-11-20 DIAGNOSIS — I609 Nontraumatic subarachnoid hemorrhage, unspecified: Secondary | ICD-10-CM | POA: Diagnosis present

## 2023-11-20 DIAGNOSIS — Z1611 Resistance to penicillins: Secondary | ICD-10-CM | POA: Diagnosis present

## 2023-11-20 DIAGNOSIS — Y838 Other surgical procedures as the cause of abnormal reaction of the patient, or of later complication, without mention of misadventure at the time of the procedure: Secondary | ICD-10-CM | POA: Diagnosis not present

## 2023-11-20 DIAGNOSIS — F1910 Other psychoactive substance abuse, uncomplicated: Secondary | ICD-10-CM | POA: Diagnosis present

## 2023-11-20 DIAGNOSIS — E871 Hypo-osmolality and hyponatremia: Secondary | ICD-10-CM | POA: Diagnosis present

## 2023-11-20 DIAGNOSIS — R0689 Other abnormalities of breathing: Secondary | ICD-10-CM | POA: Diagnosis present

## 2023-11-20 DIAGNOSIS — Z716 Tobacco abuse counseling: Secondary | ICD-10-CM

## 2023-11-20 DIAGNOSIS — N390 Urinary tract infection, site not specified: Secondary | ICD-10-CM | POA: Diagnosis present

## 2023-11-20 DIAGNOSIS — G919 Hydrocephalus, unspecified: Secondary | ICD-10-CM | POA: Diagnosis not present

## 2023-11-20 DIAGNOSIS — R29727 NIHSS score 27: Secondary | ICD-10-CM | POA: Diagnosis present

## 2023-11-20 DIAGNOSIS — Z8349 Family history of other endocrine, nutritional and metabolic diseases: Secondary | ICD-10-CM

## 2023-11-20 DIAGNOSIS — E1165 Type 2 diabetes mellitus with hyperglycemia: Secondary | ICD-10-CM | POA: Diagnosis present

## 2023-11-20 DIAGNOSIS — G9389 Other specified disorders of brain: Secondary | ICD-10-CM | POA: Diagnosis present

## 2023-11-20 DIAGNOSIS — I69391 Dysphagia following cerebral infarction: Secondary | ICD-10-CM | POA: Diagnosis not present

## 2023-11-20 DIAGNOSIS — I615 Nontraumatic intracerebral hemorrhage, intraventricular: Secondary | ICD-10-CM | POA: Diagnosis present

## 2023-11-20 DIAGNOSIS — I610 Nontraumatic intracerebral hemorrhage in hemisphere, subcortical: Secondary | ICD-10-CM | POA: Diagnosis not present

## 2023-11-20 DIAGNOSIS — F17210 Nicotine dependence, cigarettes, uncomplicated: Secondary | ICD-10-CM | POA: Diagnosis present

## 2023-11-20 DIAGNOSIS — R471 Dysarthria and anarthria: Secondary | ICD-10-CM | POA: Diagnosis present

## 2023-11-20 DIAGNOSIS — I69098 Other sequelae following nontraumatic subarachnoid hemorrhage: Secondary | ICD-10-CM | POA: Diagnosis not present

## 2023-11-20 DIAGNOSIS — R519 Headache, unspecified: Secondary | ICD-10-CM | POA: Diagnosis not present

## 2023-11-20 DIAGNOSIS — G4453 Primary thunderclap headache: Secondary | ICD-10-CM | POA: Diagnosis present

## 2023-11-20 DIAGNOSIS — E785 Hyperlipidemia, unspecified: Secondary | ICD-10-CM | POA: Diagnosis present

## 2023-11-20 DIAGNOSIS — R739 Hyperglycemia, unspecified: Secondary | ICD-10-CM | POA: Diagnosis not present

## 2023-11-20 DIAGNOSIS — G911 Obstructive hydrocephalus: Secondary | ICD-10-CM | POA: Diagnosis present

## 2023-11-20 DIAGNOSIS — E876 Hypokalemia: Secondary | ICD-10-CM | POA: Diagnosis present

## 2023-11-20 DIAGNOSIS — R29714 NIHSS score 14: Secondary | ICD-10-CM | POA: Diagnosis not present

## 2023-11-20 DIAGNOSIS — I10 Essential (primary) hypertension: Secondary | ICD-10-CM | POA: Diagnosis present

## 2023-11-20 DIAGNOSIS — K5903 Drug induced constipation: Secondary | ICD-10-CM | POA: Diagnosis not present

## 2023-11-20 DIAGNOSIS — R531 Weakness: Secondary | ICD-10-CM | POA: Diagnosis not present

## 2023-11-20 DIAGNOSIS — R1310 Dysphagia, unspecified: Secondary | ICD-10-CM | POA: Diagnosis present

## 2023-11-20 DIAGNOSIS — B9620 Unspecified Escherichia coli [E. coli] as the cause of diseases classified elsewhere: Secondary | ICD-10-CM | POA: Diagnosis present

## 2023-11-20 DIAGNOSIS — Z982 Presence of cerebrospinal fluid drainage device: Secondary | ICD-10-CM | POA: Diagnosis not present

## 2023-11-20 DIAGNOSIS — R197 Diarrhea, unspecified: Secondary | ICD-10-CM | POA: Diagnosis not present

## 2023-11-20 DIAGNOSIS — R339 Retention of urine, unspecified: Secondary | ICD-10-CM | POA: Diagnosis not present

## 2023-11-20 DIAGNOSIS — F17290 Nicotine dependence, other tobacco product, uncomplicated: Secondary | ICD-10-CM | POA: Diagnosis present

## 2023-11-20 DIAGNOSIS — F419 Anxiety disorder, unspecified: Secondary | ICD-10-CM | POA: Diagnosis present

## 2023-11-20 DIAGNOSIS — Z7985 Long-term (current) use of injectable non-insulin antidiabetic drugs: Secondary | ICD-10-CM

## 2023-11-20 DIAGNOSIS — F17200 Nicotine dependence, unspecified, uncomplicated: Secondary | ICD-10-CM | POA: Diagnosis not present

## 2023-11-20 DIAGNOSIS — D649 Anemia, unspecified: Secondary | ICD-10-CM | POA: Diagnosis present

## 2023-11-20 DIAGNOSIS — Z818 Family history of other mental and behavioral disorders: Secondary | ICD-10-CM

## 2023-11-20 DIAGNOSIS — R4182 Altered mental status, unspecified: Secondary | ICD-10-CM | POA: Diagnosis not present

## 2023-11-20 DIAGNOSIS — G441 Vascular headache, not elsewhere classified: Secondary | ICD-10-CM | POA: Diagnosis not present

## 2023-11-20 DIAGNOSIS — Z85828 Personal history of other malignant neoplasm of skin: Secondary | ICD-10-CM

## 2023-11-20 DIAGNOSIS — E878 Other disorders of electrolyte and fluid balance, not elsewhere classified: Secondary | ICD-10-CM | POA: Diagnosis not present

## 2023-11-20 DIAGNOSIS — G9349 Other encephalopathy: Secondary | ICD-10-CM | POA: Diagnosis present

## 2023-11-20 DIAGNOSIS — G4700 Insomnia, unspecified: Secondary | ICD-10-CM | POA: Diagnosis not present

## 2023-11-20 DIAGNOSIS — R569 Unspecified convulsions: Secondary | ICD-10-CM | POA: Diagnosis not present

## 2023-11-20 DIAGNOSIS — K5900 Constipation, unspecified: Secondary | ICD-10-CM | POA: Diagnosis present

## 2023-11-20 DIAGNOSIS — J9600 Acute respiratory failure, unspecified whether with hypoxia or hypercapnia: Secondary | ICD-10-CM | POA: Diagnosis not present

## 2023-11-20 DIAGNOSIS — I69119 Unspecified symptoms and signs involving cognitive functions following nontraumatic intracerebral hemorrhage: Secondary | ICD-10-CM | POA: Diagnosis not present

## 2023-11-20 DIAGNOSIS — I959 Hypotension, unspecified: Secondary | ICD-10-CM | POA: Diagnosis not present

## 2023-11-20 LAB — CBC
HCT: 37.6 % (ref 36.0–46.0)
Hemoglobin: 12.3 g/dL (ref 12.0–15.0)
MCH: 27.3 pg (ref 26.0–34.0)
MCHC: 32.7 g/dL (ref 30.0–36.0)
MCV: 83.4 fL (ref 80.0–100.0)
Platelets: 196 K/uL (ref 150–400)
RBC: 4.51 MIL/uL (ref 3.87–5.11)
RDW: 13.3 % (ref 11.5–15.5)
WBC: 14.7 K/uL — ABNORMAL HIGH (ref 4.0–10.5)
nRBC: 0 % (ref 0.0–0.2)

## 2023-11-20 LAB — I-STAT CHEM 8, ED
BUN: 24 mg/dL — ABNORMAL HIGH (ref 8–23)
Calcium, Ion: 1.07 mmol/L — ABNORMAL LOW (ref 1.15–1.40)
Chloride: 105 mmol/L (ref 98–111)
Creatinine, Ser: 0.7 mg/dL (ref 0.44–1.00)
Glucose, Bld: 172 mg/dL — ABNORMAL HIGH (ref 70–99)
HCT: 36 % (ref 36.0–46.0)
Hemoglobin: 12.2 g/dL (ref 12.0–15.0)
Potassium: 3.3 mmol/L — ABNORMAL LOW (ref 3.5–5.1)
Sodium: 138 mmol/L (ref 135–145)
TCO2: 19 mmol/L — ABNORMAL LOW (ref 22–32)

## 2023-11-20 LAB — COMPREHENSIVE METABOLIC PANEL WITH GFR
ALT: 20 U/L (ref 0–44)
AST: 29 U/L (ref 15–41)
Albumin: 3.3 g/dL — ABNORMAL LOW (ref 3.5–5.0)
Alkaline Phosphatase: 53 U/L (ref 38–126)
Anion gap: 10 (ref 5–15)
BUN: 24 mg/dL — ABNORMAL HIGH (ref 8–23)
CO2: 19 mmol/L — ABNORMAL LOW (ref 22–32)
Calcium: 8.2 mg/dL — ABNORMAL LOW (ref 8.9–10.3)
Chloride: 107 mmol/L (ref 98–111)
Creatinine, Ser: 0.81 mg/dL (ref 0.44–1.00)
GFR, Estimated: >60 mL/min (ref >60)
Glucose, Bld: 179 mg/dL — ABNORMAL HIGH (ref 70–99)
Potassium: 3.2 mmol/L — ABNORMAL LOW (ref 3.5–5.1)
Sodium: 136 mmol/L (ref 135–145)
Total Bilirubin: 0.8 mg/dL (ref 0.0–1.2)
Total Protein: 5.8 g/dL — ABNORMAL LOW (ref 6.5–8.1)

## 2023-11-20 LAB — PROTIME-INR
INR: 1.2 (ref 0.8–1.2)
Prothrombin Time: 15.6 s — ABNORMAL HIGH (ref 11.4–15.2)

## 2023-11-20 LAB — RAPID URINE DRUG SCREEN, HOSP PERFORMED
Amphetamines: NOT DETECTED
Barbiturates: NOT DETECTED
Benzodiazepines: NOT DETECTED
Cocaine: NOT DETECTED
Opiates: NOT DETECTED
Tetrahydrocannabinol: NOT DETECTED

## 2023-11-20 LAB — DIFFERENTIAL
Abs Immature Granulocytes: 0.11 K/uL — ABNORMAL HIGH (ref 0.00–0.07)
Basophils Absolute: 0.1 K/uL (ref 0.0–0.1)
Basophils Relative: 0 %
Eosinophils Absolute: 0 K/uL (ref 0.0–0.5)
Eosinophils Relative: 0 %
Immature Granulocytes: 1 %
Lymphocytes Relative: 13 %
Lymphs Abs: 1.9 K/uL (ref 0.7–4.0)
Monocytes Absolute: 0.5 K/uL (ref 0.1–1.0)
Monocytes Relative: 4 %
Neutro Abs: 12.1 K/uL — ABNORMAL HIGH (ref 1.7–7.7)
Neutrophils Relative %: 82 %

## 2023-11-20 LAB — GLUCOSE, CAPILLARY: Glucose-Capillary: 114 mg/dL — ABNORMAL HIGH (ref 70–99)

## 2023-11-20 LAB — CBG MONITORING, ED: Glucose-Capillary: 159 mg/dL — ABNORMAL HIGH (ref 70–99)

## 2023-11-20 LAB — ETHANOL: Alcohol, Ethyl (B): <15 mg/dL (ref <15)

## 2023-11-20 LAB — APTT: aPTT: 25 s (ref 24–36)

## 2023-11-20 LAB — MRSA NEXT GEN BY PCR, NASAL: MRSA by PCR Next Gen: NOT DETECTED

## 2023-11-20 MED ORDER — PANTOPRAZOLE SODIUM 40 MG IV SOLR
40.0000 mg | Freq: Every day | INTRAVENOUS | Status: DC
  Administered 2023-11-21 – 2023-12-01 (×11): 40 mg via INTRAVENOUS
  Filled 2023-11-20 (×10): qty 10

## 2023-11-20 MED ORDER — ETOMIDATE 2 MG/ML IV SOLN
INTRAVENOUS | Status: AC | PRN
  Administered 2023-11-20: 20 mg via INTRAVENOUS

## 2023-11-20 MED ORDER — DOCUSATE SODIUM 100 MG PO CAPS: 100.0000 mg | ORAL_CAPSULE | Freq: Two times a day (BID) | ORAL | Status: DC

## 2023-11-20 MED ORDER — SODIUM CHLORIDE 0.9 % IV SOLN: INTRAVENOUS | Status: AC | PRN

## 2023-11-20 MED ORDER — ORAL CARE MOUTH RINSE
15.0000 mL | OROMUCOSAL | Status: DC
  Administered 2023-11-20 – 2023-11-21 (×7): 15 mL via OROMUCOSAL

## 2023-11-20 MED ORDER — LEVETIRACETAM (KEPPRA) 500 MG/5 ML ADULT IV PUSH
2000.0000 mg | Freq: Once | INTRAVENOUS | Status: AC
  Administered 2023-11-20: 2000 mg via INTRAVENOUS

## 2023-11-20 MED ORDER — ACETAMINOPHEN 650 MG RE SUPP: 650.0000 mg | RECTAL | Status: DC | PRN

## 2023-11-20 MED ORDER — CLEVIDIPINE BUTYRATE 0.5 MG/ML IV EMUL
0.0000 mg/h | INTRAVENOUS | Status: DC
  Administered 2023-11-21 (×2): 8 mg/h via INTRAVENOUS
  Administered 2023-11-21 – 2023-11-22 (×2): 2 mg/h via INTRAVENOUS
  Filled 2023-11-20 (×2): qty 50
  Filled 2023-11-20: qty 100
  Filled 2023-11-20: qty 50

## 2023-11-20 MED ORDER — ACETAMINOPHEN 325 MG PO TABS
650.0000 mg | ORAL_TABLET | ORAL | Status: DC | PRN
  Administered 2023-12-02 – 2023-12-09 (×18): 650 mg via ORAL
  Filled 2023-11-20 (×20): qty 2

## 2023-11-20 MED ORDER — LEVETIRACETAM (KEPPRA) 500 MG/5 ML ADULT IV PUSH
500.0000 mg | Freq: Two times a day (BID) | INTRAVENOUS | Status: DC
  Administered 2023-11-21 – 2023-11-23 (×5): 500 mg via INTRAVENOUS
  Filled 2023-11-20 (×5): qty 5

## 2023-11-20 MED ORDER — FENTANYL CITRATE PF 50 MCG/ML IJ SOSY
50.0000 ug | PREFILLED_SYRINGE | INTRAMUSCULAR | Status: DC | PRN
  Administered 2023-11-20 – 2023-11-21 (×2): 50 ug via INTRAVENOUS
  Filled 2023-11-20 (×2): qty 1

## 2023-11-20 MED ORDER — DOCUSATE SODIUM 50 MG/5ML PO LIQD
100.0000 mg | Freq: Two times a day (BID) | ORAL | Status: DC
  Administered 2023-11-20 – 2023-11-26 (×9): 100 mg
  Filled 2023-11-20 (×10): qty 10

## 2023-11-20 MED ORDER — LIDOCAINE HCL (PF) 1 % IJ SOLN
INTRAMUSCULAR | Status: AC
  Filled 2023-11-20: qty 5

## 2023-11-20 MED ORDER — NIMODIPINE 30 MG PO CAPS
60.0000 mg | ORAL_CAPSULE | ORAL | Status: DC
  Filled 2023-11-20 (×5): qty 2

## 2023-11-20 MED ORDER — NIMODIPINE 6 MG/ML PO SOLN
60.0000 mg | ORAL | Status: DC
  Filled 2023-11-20 (×5): qty 10

## 2023-11-20 MED ORDER — NIMODIPINE 30 MG PO CAPS
60.0000 mg | ORAL_CAPSULE | ORAL | Status: DC
  Administered 2023-11-24 – 2023-12-06 (×25): 60 mg via ORAL
  Filled 2023-11-20 (×36): qty 2

## 2023-11-20 MED ORDER — POTASSIUM CHLORIDE 20 MEQ PO PACK
40.0000 meq | PACK | Freq: Once | ORAL | Status: AC
  Administered 2023-11-20: 40 meq
  Filled 2023-11-20: qty 2

## 2023-11-20 MED ORDER — CHLORHEXIDINE GLUCONATE CLOTH 2 % EX PADS
6.0000 | MEDICATED_PAD | Freq: Every day | CUTANEOUS | Status: DC
  Administered 2023-11-20 – 2023-12-09 (×19): 6 via TOPICAL

## 2023-11-20 MED ORDER — IOHEXOL 350 MG/ML SOLN
75.0000 mL | Freq: Once | INTRAVENOUS | Status: AC | PRN
Start: 2023-11-20 — End: 2023-11-20
  Administered 2023-11-20: 75 mL via INTRAVENOUS

## 2023-11-20 MED ORDER — NOREPINEPHRINE 4 MG/250ML-% IV SOLN
INTRAVENOUS | Status: DC
Start: 2023-11-20 — End: 2023-11-20
  Filled 2023-11-20: qty 250

## 2023-11-20 MED ORDER — INSULIN ASPART 100 UNIT/ML IJ SOLN
0.0000 [IU] | INTRAMUSCULAR | Status: DC
  Administered 2023-11-21 (×3): 1 [IU] via SUBCUTANEOUS
  Administered 2023-11-21: 2 [IU] via SUBCUTANEOUS
  Administered 2023-11-22 (×4): 1 [IU] via SUBCUTANEOUS
  Administered 2023-11-24: 2 [IU] via SUBCUTANEOUS
  Administered 2023-11-24 – 2023-11-25 (×4): 1 [IU] via SUBCUTANEOUS
  Administered 2023-11-25: 2 [IU] via SUBCUTANEOUS
  Administered 2023-11-25 (×2): 1 [IU] via SUBCUTANEOUS
  Administered 2023-11-26: 2 [IU] via SUBCUTANEOUS
  Administered 2023-11-26 (×3): 1 [IU] via SUBCUTANEOUS
  Administered 2023-11-26: 2 [IU] via SUBCUTANEOUS
  Administered 2023-11-27 – 2023-11-28 (×4): 1 [IU] via SUBCUTANEOUS
  Administered 2023-11-28: 2 [IU] via SUBCUTANEOUS
  Administered 2023-11-28 – 2023-12-03 (×23): 1 [IU] via SUBCUTANEOUS

## 2023-11-20 MED ORDER — ORAL CARE MOUTH RINSE: 15.0000 mL | OROMUCOSAL | Status: DC | PRN

## 2023-11-20 MED ORDER — NIMODIPINE 6 MG/ML PO SOLN
60.0000 mg | ORAL | Status: DC
  Administered 2023-11-20 – 2023-12-03 (×71): 60 mg
  Filled 2023-11-20 (×87): qty 10

## 2023-11-20 MED ORDER — SODIUM CHLORIDE 0.9 % IV SOLN: INTRAVENOUS | Status: AC

## 2023-11-20 MED ORDER — ROCURONIUM BROMIDE 10 MG/ML (PF) SYRINGE
PREFILLED_SYRINGE | INTRAVENOUS | Status: AC | PRN
  Administered 2023-11-20: 100 mg via INTRAVENOUS

## 2023-11-20 MED ORDER — ONDANSETRON HCL 4 MG/2ML IJ SOLN
4.0000 mg | Freq: Four times a day (QID) | INTRAMUSCULAR | Status: DC | PRN
  Administered 2023-11-20 – 2023-12-06 (×11): 4 mg via INTRAVENOUS
  Filled 2023-11-20 (×11): qty 2

## 2023-11-20 MED ORDER — PROPOFOL 1000 MG/100ML IV EMUL
INTRAVENOUS | Status: AC
  Filled 2023-11-20: qty 100

## 2023-11-20 MED ORDER — LEVETIRACETAM (KEPPRA) 500 MG/5 ML ADULT IV PUSH
750.0000 mg | Freq: Two times a day (BID) | INTRAVENOUS | Status: DC
  Administered 2023-11-20: 750 mg via INTRAVENOUS
  Filled 2023-11-20: qty 10

## 2023-11-20 MED ORDER — PROPOFOL 1000 MG/100ML IV EMUL
0.0000 ug/kg/min | INTRAVENOUS | Status: DC
  Administered 2023-11-20: 70 ug/kg/min via INTRAVENOUS
  Administered 2023-11-20: 60 ug/kg/min via INTRAVENOUS
  Administered 2023-11-20: 70 ug/kg/min via INTRAVENOUS
  Administered 2023-11-20: 60 ug/kg/min via INTRAVENOUS
  Administered 2023-11-21: 50 ug/kg/min via INTRAVENOUS
  Administered 2023-11-21: 40 ug/kg/min via INTRAVENOUS
  Filled 2023-11-20 (×4): qty 100

## 2023-11-20 MED ORDER — STROKE: EARLY STAGES OF RECOVERY BOOK
Freq: Once | Status: AC
  Filled 2023-11-20: qty 1

## 2023-11-20 MED ORDER — FENTANYL CITRATE PF 50 MCG/ML IJ SOSY
50.0000 ug | PREFILLED_SYRINGE | INTRAMUSCULAR | Status: DC | PRN
  Administered 2023-11-20: 100 ug via INTRAVENOUS
  Filled 2023-11-20: qty 2

## 2023-11-20 MED ORDER — ACETAMINOPHEN 160 MG/5ML PO SOLN
650.0000 mg | ORAL | Status: DC | PRN
  Administered 2023-11-21 – 2023-12-02 (×11): 650 mg
  Filled 2023-11-20 (×12): qty 20.3

## 2023-11-20 MED ORDER — PANTOPRAZOLE SODIUM 40 MG PO TBEC
40.0000 mg | DELAYED_RELEASE_TABLET | Freq: Every day | ORAL | Status: DC
  Administered 2023-12-02 – 2023-12-09 (×8): 40 mg via ORAL
  Filled 2023-11-20 (×9): qty 1

## 2023-11-20 MED ORDER — POLYETHYLENE GLYCOL 3350 17 G PO PACK
17.0000 g | PACK | Freq: Every day | ORAL | Status: DC
  Administered 2023-11-21 – 2023-11-22 (×2): 17 g
  Filled 2023-11-20 (×2): qty 1

## 2023-11-20 NOTE — Progress Notes (Signed)
 Patient transfer from ED to 4NICU intubated.  Neurosurgeon at bedside to place EVD.  Handoff given to PM RN.

## 2023-11-20 NOTE — Op Note (Signed)
 *   No surgery found *  PATIENT:   Andrea Bridges  63 y.o. female With an acute subarachnoid hemorrhage and obstructive hydrocephalus   PRE-OPERATIVE DIAGNOSIS: obstructive hydrocephalus  POST-OPERATIVE DIAGNOSIS:  Same  PROCEDURE:  right frontal ventricular catheter placement  SURGEON:  Lacresha Fusilier ANESTHESIA:   local DRAINS: Ventriculostomy Drain in the right lateral ventricle.   SPECIMEN: none  DICTATION: [name] Hammersmith had female  head shaved and then prepared in a sterile manner. I draped her  in a sterile manner. I injected lidocaine  into the planned coronal incision in line with the right pupillary line anterior to the tragus. I opened the skin with a 15 blade. I used the hand twist drill to create a burr hole. I opened the dura with the 20 gauge spinal needle. I passed the ventricular catheter into the lateral ventricle. There was brisk flow of spinal fluid. I tunneled the catheter posteriorly and secured it to the skin at the exit. I approximated the scalp edges with nylon sutures. I placed a sterile dressing, then connected the catheter to the drainage system.   PLAN OF CARE: Admit to inpatient   PATIENT DISPOSITION:  PACU - hemodynamically stable.

## 2023-11-20 NOTE — ED Triage Notes (Signed)
 According to [facility]: Pt was called in for thunderclap headache with sudden onset. Pt was alert and oriented complaining of head pain. Pt was complaining of frontal head pain until arrived outside ed room then began  shallow labored breathing and began to trill and moan. Pt had been complaining of nausea and vomiting. She was aox4 but is now unresponsive to anything other then noxious stimuli.  Ems placed 18 gauge piv in left ac, pt was given 1000 ml ns on ride to ed.  Vitals: 130/60 Hr 74 Spo2 room 100%

## 2023-11-20 NOTE — Progress Notes (Signed)
 eLink Physician-Brief Progress Note [name] DOB: [birth year] MRN: [id number]   Date of Service  11/20/2023  HPI/Events of Note  63 year old woman w/ hx of smoking, obesity presenting with headache. Workup reveals large subarachnoid hemorrhage, now status post .EVD.  RT unable to obtain arterial line, use for close monitoring of blood pressure in the setting of EVD.  eICU Interventions  Will request ground team evaluation, although BP is well-controlled at this time with sphygmomanometry      Intervention Category Minor Interventions: Routine modifications to care plan (e.g. PRN medications for pain, fever)  Andrea Bridges 11/20/2023, 11:17 PM

## 2023-11-20 NOTE — Code Documentation (Signed)
 Intubated 24 at the lip by browoski MD

## 2023-11-20 NOTE — Code Documentation (Signed)
 Stroke Response Nurse Documentation Code Documentation  Andrea Bridges is a 63 y.o. female arriving to [facility]  via [facility] on 11/20/23 with past medical hx of skin ca, PCOS, anxiety. On No antithrombotic. Code stroke was activated by ED.   Patient from home where she was LKW at 1330 and now complaining of worst HA of her life enroute she was alert and oriented.  She suddenly became unresponsive on arrival to triage code stroke activated.   Stroke team at the bedside on patient arrival. Labs drawn and patient cleared for CT by Dr. Raoul. Patient to CT with team. NIHSS 27, see documentation for details and code stroke times. Patient with decreased LOC, disoriented, not following commands, right arm weakness, bilateral leg weakness, Global aphasia , and dysarthria  on exam. The following imaging was completed:  CT Head and CTA. Patient is not a candidate for IV Thrombolytic due to [facility]. Patient is not a candidate for IR due to no LVO on CTA.   Returned to ED room.  Patient intubated   Transported to 4N ICU:  plan to place drain.  Elvin Portland  Stroke Response RN

## 2023-11-20 NOTE — ED Notes (Signed)
 Pt found unresponsive during triage. Dr raoul called to bedside, code stroke called by [facility] MD.

## 2023-11-20 NOTE — H&P (Addendum)
 NAME:  Andrea Bridges, MRN:  [id number], DOB:  26-Jul-1960, LOS: 0 ADMISSION DATE:  11/20/2023, CONSULTATION DATE:  11/20/23 REFERRING MD:  EDP, CHIEF COMPLAINT:  headache   History of Present Illness:  63 year old woman w/ hx of smoking, obesity presenting with headache.  Workup reveals large subarachnoid hemorrhage.  CTA neg but V4 of both vertebrals and distal basilar artery taper off concerning for possible early vasospasm and making full eval of this area difficult.  NSGY coming in to consider drain.  Intermittent purposeful movement and occasionally says ow to nursing.  PCCM to admit.  Pertinent  Medical History   Past Medical History:  Diagnosis Date   Acute appendicitis with perforation and localized peritonitis, without abscess or gangrene 12/11/2019   Anxiety    Infertility, female    PCOS (polycystic ovarian syndrome)    Skin cancer    basal cell   SOBOE (shortness of breath on exertion) 09/08/2022     Significant Hospital Events: Including procedures, antibiotic start and stop dates in addition to other pertinent events   11/20/23 admit  Interim History / Subjective:  Admit  Objective    Blood pressure (!) 128/113, pulse 61, resp. rate 20, SpO2 96%.       No intake or output data in the 24 hours ending 11/20/23 1642 There were no vitals filed for this visit.  Examination: General: Somnolent HENT: Pupils midline., no doll's for me Lungs: clear but occasional sonorous respirations, cough is okay Cardiovascular: RRR, ext warm Abdomen: soft, +BS Extremities: no edema Neuro: moves everything occasional purposefully, not following commands, was talking apparently for EMS but deteriorated progressively in ER GU: foley to be placed  K low being replaced Mild WBC count CT CTA reviewed  Resolved problem list   Assessment and Plan  Nontraumatic SAH with developing hydrocephalus- CTA neg but noted limitations due to probable vasospasm.  Seen with Dr. Gillie at bedside  who will be placing EVD upstairs, starting to throw up and mental status deteriorating so intubating for airway protection.  Coags okay.  No blood thinners. [facility] EMS, 4 now [facility] 4  - Vent bundle - Prop/fent for RASS -1 to -2 - EVD setup per NSGY - cEEG - Further imaging, AEDs per NSGY/neuro discussion - nimotop  - Maintain euglycemia, normothermia, euvolemia  Best Practice (right click and Reselect all SmartList Selections daily)   Diet/type: NPO DVT prophylaxis not indicated Pressure ulcer(s): N/A GI prophylaxis: PPI Lines: Central line Foley:  Yes, and it is still needed Code Status:  full code Last date of multidisciplinary goals of care discussion [daughter/exhusband updated]  Labs   CBC: Recent Labs  Lab 11/20/23 1635  HGB 12.2  HCT 36.0    Basic Metabolic Panel: Recent Labs  Lab 11/20/23 1635  NA 138  K 3.3*  CL 105  GLUCOSE 172*  BUN 24*  CREATININE 0.70   GFR: CrCl cannot be calculated (Unknown ideal weight.). No results for input(s): PROCALCITON, WBC, LATICACIDVEN in the last 168 hours.  Liver Function Tests: No results for input(s): AST, ALT, ALKPHOS, BILITOT, PROT, ALBUMIN in the last 168 hours. No results for input(s): LIPASE, AMYLASE in the last 168 hours. No results for input(s): AMMONIA in the last 168 hours.  ABG    Component Value Date/Time   TCO2 19 (L) 11/20/2023 1635     Coagulation Profile: No results for input(s): INR, PROTIME in the last 168 hours.  Cardiac Enzymes: No results for input(s): CKTOTAL, CKMB, CKMBINDEX,  TROPONINI in the last 168 hours.  HbA1C: Hemoglobin A1C  Date/Time Value Ref Range Status  01/06/2023 12:00 AM 5.2  Final   Hgb A1c MFr Bld  Date/Time Value Ref Range Status  07/25/2023 10:23 AM 5.5 4.8 - 5.6 % Final    Comment:             Prediabetes: 5.7 - 6.4          Diabetes: >6.4          Glycemic control for adults with diabetes: <7.0   03/17/2023 09:36  AM 5.6 4.8 - 5.6 % Final    Comment:             Prediabetes: 5.7 - 6.4          Diabetes: >6.4          Glycemic control for adults with diabetes: <7.0     CBG: Recent Labs  Lab 11/20/23 1556  GLUCAP 159*    Review of Systems:   Too altered  Past Medical History:  She,  has a past medical history of Acute appendicitis with perforation and localized peritonitis, without abscess or gangrene (12/11/2019), Anxiety, Infertility, female, PCOS (polycystic ovarian syndrome), Skin cancer, and SOBOE (shortness of breath on exertion) (09/08/2022).   Surgical History:   Past Surgical History:  Procedure Laterality Date   APPENDECTOMY  2021   BREAST BIOPSY       Social History:   reports that she has been smoking cigarettes. She has never used smokeless tobacco. She reports that she does not currently use alcohol. She reports that she does not use drugs.   Family History:  Her family history includes Anxiety disorder in her mother; Cancer in her father; Obesity in her mother; Sleep apnea in her father; Thyroid  disease in her mother.   Allergies No Known Allergies   Home Medications  Prior to Admission medications   Medication Sig Start Date End Date Taking? Authorizing Provider  Semaglutide -Weight Management (WEGOVY ) 1.7 MG/0.75ML SOAJ Inject 1.7 mg into the skin once a week. 10/19/23   Opalski, Deborah, DO  Vitamin D , Ergocalciferol , (DRISDOL ) 1.25 MG (50000 UNIT) CAPS capsule Take 1 capsule (50,000 Units total) by mouth every 7 (seven) days. 10/19/23   Midge Sober, DO     Critical care time: 33 mins independent of procedures

## 2023-11-20 NOTE — ED Provider Notes (Signed)
 [place] [facility] Provider Note   CSN: [id number] Arrival date & time: 11/20/23  1542  An emergency department physician performed an initial assessment on this suspected stroke patient at 40.  Patient presents with: headache  Janaiyah Blackard is a 63 y.o. female with Pmhx of anxiety, PCOS, tobacco use, and obesity who presented to the ED initially with concern for severe headache. I was called to patient's room just after she was triaged for sudden mental status change. Per EMS report, they were called out for thunderclap headache that began around 3pm. Patient was last seen completely normal at 1:30pm by family. On EMS arrival and en route to the ED, patient was acutely nauseous/vomiting but was alert and fully responsive, answering questions coherently. As of patient being roomed, she became near-fully unresponsive, only intermittently moaning to painful stimuli and making no purposeful movements. A code stroke was promptly initiated.    Prior to Admission medications   Medication Sig Start Date End Date Taking? Authorizing Provider  ibuprofen (ADVIL) 200 MG tablet Take 400 mg by mouth every 6 (six) hours as needed for moderate pain (pain score 4-6).   Yes [provider]  Semaglutide -Weight Management (WEGOVY ) 1.7 MG/0.75ML SOAJ Inject 1.7 mg into the skin once a week. 10/19/23  Yes Opalski, Deborah, DO  Vitamin D , Ergocalciferol , (DRISDOL ) 1.25 MG (50000 UNIT) CAPS capsule Take 1 capsule (50,000 Units total) by mouth every 7 (seven) days. 10/19/23  Yes Opalski, Barnie, DO    Allergies: Patient has no known allergies.      Updated Vital Signs BP 116/75   Pulse 80   Temp 99.3 F (37.4 C)   Resp 20   Wt 90 kg   LMP  (LMP Unknown)   SpO2 100%   BMI 31.08 kg/m   Physical Exam Constitutional:      Appearance: She is ill-appearing.     Comments: Obtunded but breathing spontaneously  HENT:     Head: Normocephalic and atraumatic.      Mouth/Throat:     Comments: Jaw clenched shut, unable to follow command to relax jaw Eyes:     Pupils: Pupils are equal, round, and reactive to light.     Comments: Pupils 3mm b/l  Cardiovascular:     Rate and Rhythm: Normal rate and regular rhythm.     Heart sounds: No murmur heard.    No gallop.  Pulmonary:     Effort: No respiratory distress.     Breath sounds: Normal breath sounds. No wheezing.     Comments: Protecting airway on initial assessment Abdominal:     Palpations: Abdomen is soft.     Tenderness: There is no guarding.  Skin:    General: Skin is warm and dry.     Capillary Refill: Capillary refill takes less than 2 seconds.  Neurological:     Mental Status: She is unresponsive.     GCS: GCS eye subscore is 2. GCS verbal subscore is 2. GCS motor subscore is 4.     Cranial Nerves: No facial asymmetry.     Motor: No seizure activity.     Comments: Patient unable to participate in detailed sensory/strength exam. Withdraws LLE from pain and has anti-gravity strength in LLE/LUE. Flaccid RUE/RLE on initial exam. No purposeful movements initially.     (all labs ordered are listed, but only abnormal results are displayed) Labs Reviewed  PROTIME-INR - Abnormal; Notable for the following components:      Result Value  Prothrombin Time 15.6 (*)    All other components within normal limits  CBC - Abnormal; Notable for the following components:   WBC 14.7 (*)    All other components within normal limits  DIFFERENTIAL - Abnormal; Notable for the following components:   Neutro Abs 12.1 (*)    Abs Immature Granulocytes 0.11 (*)    All other components within normal limits  COMPREHENSIVE METABOLIC PANEL WITH GFR - Abnormal; Notable for the following components:   Potassium 3.2 (*)    CO2 19 (*)    Glucose, Bld 179 (*)    BUN 24 (*)    Calcium 8.2 (*)    Total Protein 5.8 (*)    Albumin 3.3 (*)    All other components within normal limits  GLUCOSE, CAPILLARY - Abnormal;  Notable for the following components:   Glucose-Capillary 114 (*)    All other components within normal limits  CBG MONITORING, ED - Abnormal; Notable for the following components:   Glucose-Capillary 159 (*)    All other components within normal limits  I-STAT CHEM 8, ED - Abnormal; Notable for the following components:   Potassium 3.3 (*)    BUN 24 (*)    Glucose, Bld 172 (*)    Calcium, Ion 1.07 (*)    TCO2 19 (*)    All other components within normal limits  MRSA NEXT GEN BY PCR, NASAL  ETHANOL  APTT  RAPID URINE DRUG SCREEN, HOSP PERFORMED  HIV ANTIBODY (ROUTINE TESTING W REFLEX)  RAPID URINE DRUG SCREEN, HOSP PERFORMED  CBC  RENAL FUNCTION PANEL  TRIGLYCERIDES    EKG: EKG Interpretation Date/Time:  Sunday November 20 2023 16:00:52 EDT Ventricular Rate:  74 PR Interval:  209 QRS Duration:  101 QT Interval:  434 QTC Calculation: 482 R Axis:   98  Text Interpretation: Sinus rhythm Probable right ventricular hypertrophy Borderline T abnormalities, anterior leads Artifact Confirmed by Garrick Charleston [phone]) on 11/20/2023 4:29:47 PM  Radiology: [facility] Abd 1 View Result Date: 11/20/2023 CLINICAL DATA:  Check gastric catheter placement EXAM: ABDOMEN - 1 VIEW COMPARISON:  None Available. FINDINGS: Gastric catheter extends into the stomach. No free air is seen. No obstructive changes are noted. IMPRESSION: Gastric catheter within the stomach. Electronically Signed   By: Oneil Devonshire M.D.   On: 11/20/2023 20:34   CT HEAD CODE STROKE WO CONTRAST Result Date: 11/20/2023 EXAM: CT HEAD WITHOUT CONTRAST 11/20/2023 04:14:00 PM TECHNIQUE: CT of the head was performed without the administration of intravenous contrast. Automated exposure control, iterative reconstruction, and/or weight based adjustment of the mA/kV was utilized to reduce the radiation dose to as low as reasonably achievable. COMPARISON: None available. CLINICAL HISTORY: Neuro deficit, acute, stroke suspected. Chief complaints;  headache; CT HEAD CODE STROKE WO CONTRAST; Neuro deficit, acute, stroke suspected; Deficits- right side weakness, non verbal FINDINGS: BRAIN AND VENTRICLES: Diffuse basilar subarachnoid hemorrhage with intraventricular extension and early ventriculomegaly. The hemorrhage is greatest along the left cerebellopontine angle cistern possibly indicating the site of origin. Attention on follow up CTA. ORBITS: No acute abnormality. SINUSES: No acute abnormality. SOFT TISSUES AND SKULL: No acute soft tissue abnormality. No skull fracture. IMPRESSION: 1. Diffuse basilar subarachnoid hemorrhage with intraventricular extension and early ventriculomegaly, greatest along the left cerebellopontine angle cistern, possibly indicating the site of origin. Attention on follow-up CTA. Code stroke results were communicated to Dr. Matthews at 4:15 pm on 11/20/2023 by phone. Electronically signed by: Ryan Chess MD 11/20/2023 04:35 PM EDT RP Workstation: [id number]  CT ANGIO HEAD NECK W WO CM (CODE STROKE) Result Date: 11/20/2023 EXAM: CTA HEAD AND NECK WITH AND WITHOUT 11/20/2023 04:15:00 PM TECHNIQUE: CTA of the head and neck was performed with and without the administration of intravenous contrast. Multiplanar 2D and/or 3D reformatted images are provided for review. Automated exposure control, iterative reconstruction, and/or weight based adjustment of the mA/kV was utilized to reduce the radiation dose to as low as reasonably achievable. Stenosis of the internal carotid arteries measured using NASCET criteria. COMPARISON: None available CLINICAL HISTORY: Neuro deficit, acute, stroke suspected. Chief complaints; headache; CT ANGIO HEAD NECK W WO CM (CODE STROKE); Neuro deficit, acute, stroke suspected. FINDINGS: CTA NECK: AORTIC ARCH AND ARCH VESSELS: No dissection or arterial injury. No significant stenosis of the brachiocephalic or subclavian arteries. CERVICAL CAROTID ARTERIES: No dissection, arterial injury, or hemodynamically  significant stenosis by NASCET criteria. Tortuous cervical ICAs. CERVICAL VERTEBRAL ARTERIES: Abrupt tapering of the V4 segment of both vertebral arteries, concerning for early vasospasm, which could be masking an aneurysm. LUNGS AND MEDIASTINUM: Unremarkable. SOFT TISSUES: No acute abnormality. BONES: No acute abnormality. CTA HEAD: ANTERIOR CIRCULATION: No significant stenosis of the internal carotid arteries. No significant stenosis of the anterior cerebral arteries. No significant stenosis of the middle cerebral arteries. No aneurysm. POSTERIOR CIRCULATION: Persistent fetal origin of the right PCA. Abrupt tapering of the distal basilar artery, concerning for early vasospasm, which could be masking an aneurysm. No significant stenosis of the posterior cerebral arteries. No significant stenosis of the basilar artery. No significant stenosis of the vertebral arteries. OTHER: No dural venous sinus thrombosis on this non-dedicated study. IMPRESSION: 1. No definite aneurysm or vascular malformation is identified. Abrupt tapering of the V4 segment of both vertebral arteries and the distal basilar artery, concerning for early vasospasm, which could be masking an aneurysm. Code stroke results were communicated to Dr. Matthews at 4:26 pm on 11/20/2023 by phone. Electronically signed by: Ryan Chess MD 11/20/2023 04:33 PM EDT RP Workstation: [id number]    Procedure Name: Intubation Date/Time: 11/20/2023 6:30 PM  Performed by: Raoul Rake, MDPre-anesthesia Checklist: Patient identified, Emergency Drugs available, Suction available and Patient being monitored Oxygen Delivery Method: Nasal cannula Preoxygenation: Pre-oxygenation with 100% oxygen Induction Type: Rapid sequence Ventilation: Unable to mask ventilate Laryngoscope Size: Glidescope and 3 Grade View: Grade II Tube type: Non-subglottic suction tube Tube size: 7.5 mm Number of attempts: 1 Airway Equipment and Method: Rigid stylet and  Video-laryngoscopy Placement Confirmation: Positive ETCO2, ETT inserted through vocal cords under direct vision and Breath sounds checked- equal and bilateral Secured at: 24 cm Tube secured with: ETT holder Dental Injury: Teeth and Oropharynx as per pre-operative assessment  Difficulty Due To: Difficulty was anticipated       Medications Ordered in the ED  clevidipine  (CLEVIPREX ) infusion 0.5 mg/mL (has no administration in time range)  ondansetron  (ZOFRAN ) injection 4 mg (4 mg Intravenous Given 11/20/23 1641)   stroke: early stages of recovery book (has no administration in time range)  0.9 %  sodium chloride  infusion ( Intravenous Infusion Verify 11/21/23 0000)  acetaminophen  (TYLENOL ) tablet 650 mg (has no administration in time range)    Or  acetaminophen  (TYLENOL ) 160 MG/5ML solution 650 mg (has no administration in time range)    Or  acetaminophen  (TYLENOL ) suppository 650 mg (has no administration in time range)  pantoprazole  (PROTONIX ) EC tablet 40 mg (has no administration in time range)    Or  pantoprazole  (PROTONIX ) injection 40 mg (has no administration in time range)  Chlorhexidine   Gluconate Cloth 2 % PADS 6 each (6 each Topical Given 11/20/23 1900)  docusate (COLACE) 50 MG/5ML liquid 100 mg (100 mg Per Tube Given 11/20/23 2135)  polyethylene glycol (MIRALAX  / GLYCOLAX ) packet 17 g (17 g Per Tube Not Given 11/20/23 2024)  propofol  (DIPRIVAN ) 1000 MG/100ML infusion (70 mcg/kg/min  90 kg Intravenous Infusion Verify 11/21/23 0000)  fentaNYL  (SUBLIMAZE ) injection 50 mcg (50 mcg Intravenous Given 11/21/23 0033)  fentaNYL  (SUBLIMAZE ) injection 50-200 mcg (100 mcg Intravenous Given 11/20/23 1823)  niMODipine  (NIMOTOP ) capsule 60 mg ( Oral See Alternative 11/20/23 2135)    Or  niMODipine  (NYMALIZE ) 6 MG/ML oral solution 60 mg (60 mg Per Tube Given 11/20/23 2135)  insulin  aspart (novoLOG ) injection 0-9 Units ( Subcutaneous Not Given 11/20/23 2341)  0.9 %  sodium chloride  infusion (has no  administration in time range)  levETIRAcetam  (KEPPRA ) undiluted injection 500 mg (has no administration in time range)  Oral care mouth rinse (15 mLs Mouth Rinse Given 11/20/23 2341)  Oral care mouth rinse (has no administration in time range)  iohexol  (OMNIPAQUE ) 350 MG/ML injection 75 mL (75 mLs Intravenous Contrast Given 11/20/23 1616)  levETIRAcetam  (KEPPRA ) undiluted injection 2,000 mg (2,000 mg Intravenous Given 11/20/23 1625)  propofol  (DIPRIVAN ) 1000 MG/100ML infusion (  Duplicate 11/20/23 2024)  etomidate  (AMIDATE ) injection (20 mg Intravenous Given 11/20/23 1724)  rocuronium  (ZEMURON ) injection (100 mg Intravenous Given 11/20/23 1724)  potassium chloride  (KLOR-CON ) packet 40 mEq (40 mEq Per Tube Given 11/20/23 2135)  lidocaine  (PF) (XYLOCAINE ) 1 % injection (  Duplicate 11/20/23 2024)    Medical Decision Making Patient with the above history presenting with an acute severe thunderclap headache beginning around 3pm, initially alert/fully responsive with EMS but upon arrival to ED became obtunded/minimally responsive. Code stroke was initiated and patient was stable from an airway protection standpoint to go to CT scanner per my initial evaluation.  As above, imaging notable for significant diffuse basilar SAH with intraventricular extension and signs of early obstructive hydrocephalus. Neurology consult team was present for code stroke and requested involvement of neurosurgery, who was consulted for intraventricular drain placement.  Patient was started on cleviprex  for close BP management and hypertonic saline. While waiting for inpatient bed and drain placement per NSGY, patient's ability to protect her airway became a more acute concern as she began vomiting and in anticipation of medications required for NSGY intervention, so she was intubated in the ED. She was then promptly moved to assigned ICU bed for further inpatient management. Patient's daughter and ex-husband were updated at bedside  earlier in hospital course by neurology team.   Amount and/or Complexity of Data Reviewed Labs: ordered.    Details: Poc glucose 159, mild leukocytosis to 14.7, mild hypokalemia to 3.2, otherwise no significant abnormalities on lab workup Radiology: ordered.    Details: CTH with diffuse basilar subarachnoid hemorrhage with intraventricular extension and early ventriculomegaly, greatest along the left cerebellopontine angle cistern, possibly indicating the site of origin CTA head/neck with no definite aneurysm or vascular malformation is identified. Abrupt tapering of the V4 segment of both vertebral arteries and the distal basilar artery, concerning for early vasospasm, which could be masking an aneurysm  Risk Decision regarding hospitalization.    Final diagnoses:  SAH (subarachnoid hemorrhage) (HCC)  IVH (intraventricular hemorrhage) (HCC)      Raoul Rake, MD 11/21/23 0107    Garrick Charleston, MD 11/23/23 1429

## 2023-11-20 NOTE — Consult Note (Signed)
 Reason for Consult:non traumatic Subarachnoid hemorrhage Referring Physician: [name] is an 63 y.o. female.  HPI: whom this afternoon described a thunderclap headache sometime after 1500. EMS called and on arrival to her home she was alert, coherent, speaking easily, following all commands.  Complained of a frontal headache. Upon arrival to ED she became nauseated and was unresponsive. By report she moved on her right upper extremity. Taken urgently to CT she started moving all extremities by that time. She was also able to follow some commands. I was called with the subarachnoid findings. Past Medical History:  Diagnosis Date   Acute appendicitis with perforation and localized peritonitis, without abscess or gangrene 12/11/2019   Anxiety    Infertility, female    PCOS (polycystic ovarian syndrome)    Skin cancer    basal cell   SOBOE (shortness of breath on exertion) 09/08/2022    Past Surgical History:  Procedure Laterality Date   APPENDECTOMY  2021   BREAST BIOPSY      Family History  Problem Relation Age of Onset   Thyroid  disease Mother    Anxiety disorder Mother    Obesity Mother    Cancer Father        skin   Sleep apnea Father     Social History:  reports that she has been smoking cigarettes. She has never used smokeless tobacco. She reports that she does not currently use alcohol. She reports that she does not use drugs.  Allergies: No Known Allergies  Medications: I have reviewed the patient's current medications.  Results for orders placed or performed during the hospital encounter of 11/20/23 (from the past 48 hours)  CBG monitoring, ED     Status: Abnormal   Collection Time: 11/20/23  3:56 PM  Result Value Ref Range   Glucose-Capillary 159 (H) 70 - 99 mg/dL    Comment: Glucose reference range applies only to samples taken after fasting for at least 8 hours.  Ethanol     Status: None   Collection Time: 11/20/23  4:30 PM  Result Value Ref Range    Alcohol, Ethyl (B) <15 <15 mg/dL    Comment: (NOTE) For medical purposes only. Performed at [facility], [address], [place], [place] [address]   Protime-INR     Status: Abnormal   Collection Time: 11/20/23  4:30 PM  Result Value Ref Range   Prothrombin Time 15.6 (H) 11.4 - 15.2 seconds   INR 1.2 0.8 - 1.2    Comment: (NOTE) INR goal varies based on device and disease states. Performed at [facility], [address] [place], [place] [address]   APTT     Status: None   Collection Time: 11/20/23  4:30 PM  Result Value Ref Range   aPTT 25 24 - 36 seconds    Comment: Performed at [facility], [address] [place], [place] [address]  CBC     Status: Abnormal   Collection Time: 11/20/23  4:30 PM  Result Value Ref Range   WBC 14.7 (H) 4.0 - 10.5 K/uL   RBC 4.51 3.87 - 5.11 MIL/uL   Hemoglobin 12.3 12.0 - 15.0 g/dL   HCT 62.3 63.9 - 53.9 %   MCV 83.4 80.0 - 100.0 fL   MCH 27.3 26.0 - 34.0 pg   MCHC 32.7 30.0 - 36.0 g/dL   RDW 86.6 88.4 - 84.4 %   Platelets 196 150 - 400 K/uL   nRBC  0.0 0.0 - 0.2 %    Comment: Performed at [facility], [address] [place], [place] [address]  Differential     Status: Abnormal   Collection Time: 11/20/23  4:30 PM  Result Value Ref Range   Neutrophils Relative % 82 %   Neutro Abs 12.1 (H) 1.7 - 7.7 K/uL   Lymphocytes Relative 13 %   Lymphs Abs 1.9 0.7 - 4.0 K/uL   Monocytes Relative 4 %   Monocytes Absolute 0.5 0.1 - 1.0 K/uL   Eosinophils Relative 0 %   Eosinophils Absolute 0.0 0.0 - 0.5 K/uL   Basophils Relative 0 %   Basophils Absolute 0.1 0.0 - 0.1 K/uL   Immature Granulocytes 1 %   Abs Immature Granulocytes 0.11 (H) 0.00 - 0.07 K/uL    Comment: Performed at [facility], [address], [place], [place] [address]  Comprehensive metabolic panel     Status: Abnormal   Collection Time: 11/20/23  4:30 PM  Result Value Ref Range   Sodium 136 135 - 145 mmol/L   Potassium 3.2 (L) 3.5 - 5.1  mmol/L   Chloride 107 98 - 111 mmol/L   CO2 19 (L) 22 - 32 mmol/L   Glucose, Bld 179 (H) 70 - 99 mg/dL    Comment: Glucose reference range applies only to samples taken after fasting for at least 8 hours.   BUN 24 (H) 8 - 23 mg/dL   Creatinine, Ser 9.18 0.44 - 1.00 mg/dL   Calcium 8.2 (L) 8.9 - 10.3 mg/dL   Total Protein 5.8 (L) 6.5 - 8.1 g/dL   Albumin 3.3 (L) 3.5 - 5.0 g/dL   AST 29 15 - 41 U/L   ALT 20 0 - 44 U/L   Alkaline Phosphatase 53 38 - 126 U/L   Total Bilirubin 0.8 0.0 - 1.2 mg/dL   GFR, Estimated >39 >39 mL/min    Comment: (NOTE) Calculated using the CKD-EPI Creatinine Equation (2021)    Anion gap 10 5 - 15    Comment: Performed at [facility], [address] [place], [place] [address]  I-stat chem 8, ED     Status: Abnormal   Collection Time: 11/20/23  4:35 PM  Result Value Ref Range   Sodium 138 135 - 145 mmol/L   Potassium 3.3 (L) 3.5 - 5.1 mmol/L   Chloride 105 98 - 111 mmol/L   BUN 24 (H) 8 - 23 mg/dL   Creatinine, Ser 9.29 0.44 - 1.00 mg/dL   Glucose, Bld 827 (H) 70 - 99 mg/dL    Comment: Glucose reference range applies only to samples taken after fasting for at least 8 hours.   Calcium, Ion 1.07 (L) 1.15 - 1.40 mmol/L   TCO2 19 (L) 22 - 32 mmol/L   Hemoglobin 12.2 12.0 - 15.0 g/dL   HCT 63.9 63.9 - 53.9 %    CT HEAD CODE STROKE WO CONTRAST Result Date: 11/20/2023 EXAM: CT HEAD WITHOUT CONTRAST 11/20/2023 04:14:00 PM TECHNIQUE: CT of the head was performed without the administration of intravenous contrast. Automated exposure control, iterative reconstruction, and/or weight based adjustment of the mA/kV was utilized to reduce the radiation dose to as low as reasonably achievable. COMPARISON: None available. CLINICAL HISTORY: Neuro deficit, acute, stroke suspected. Chief complaints; headache; CT HEAD CODE STROKE WO CONTRAST; Neuro deficit, acute, stroke suspected; Deficits- right side weakness, non verbal FINDINGS: BRAIN AND VENTRICLES: Diffuse basilar  subarachnoid hemorrhage with intraventricular extension and early ventriculomegaly.  The hemorrhage is greatest along the left cerebellopontine angle cistern possibly indicating the site of origin. Attention on follow up CTA. ORBITS: No acute abnormality. SINUSES: No acute abnormality. SOFT TISSUES AND SKULL: No acute soft tissue abnormality. No skull fracture. IMPRESSION: 1. Diffuse basilar subarachnoid hemorrhage with intraventricular extension and early ventriculomegaly, greatest along the left cerebellopontine angle cistern, possibly indicating the site of origin. Attention on follow-up CTA. Code stroke results were communicated to Dr. Matthews at 4:15 pm on 11/20/2023 by phone. Electronically signed by: Ryan Chess MD 11/20/2023 04:35 PM EDT RP Workstation: [id number]   CT ANGIO HEAD NECK W WO CM (CODE STROKE) Result Date: 11/20/2023 EXAM: CTA HEAD AND NECK WITH AND WITHOUT 11/20/2023 04:15:00 PM TECHNIQUE: CTA of the head and neck was performed with and without the administration of intravenous contrast. Multiplanar 2D and/or 3D reformatted images are provided for review. Automated exposure control, iterative reconstruction, and/or weight based adjustment of the mA/kV was utilized to reduce the radiation dose to as low as reasonably achievable. Stenosis of the internal carotid arteries measured using NASCET criteria. COMPARISON: None available CLINICAL HISTORY: Neuro deficit, acute, stroke suspected. Chief complaints; headache; CT ANGIO HEAD NECK W WO CM (CODE STROKE); Neuro deficit, acute, stroke suspected. FINDINGS: CTA NECK: AORTIC ARCH AND ARCH VESSELS: No dissection or arterial injury. No significant stenosis of the brachiocephalic or subclavian arteries. CERVICAL CAROTID ARTERIES: No dissection, arterial injury, or hemodynamically significant stenosis by NASCET criteria. Tortuous cervical ICAs. CERVICAL VERTEBRAL ARTERIES: Abrupt tapering of the V4 segment of both vertebral arteries, concerning for  early vasospasm, which could be masking an aneurysm. LUNGS AND MEDIASTINUM: Unremarkable. SOFT TISSUES: No acute abnormality. BONES: No acute abnormality. CTA HEAD: ANTERIOR CIRCULATION: No significant stenosis of the internal carotid arteries. No significant stenosis of the anterior cerebral arteries. No significant stenosis of the middle cerebral arteries. No aneurysm. POSTERIOR CIRCULATION: Persistent fetal origin of the right PCA. Abrupt tapering of the distal basilar artery, concerning for early vasospasm, which could be masking an aneurysm. No significant stenosis of the posterior cerebral arteries. No significant stenosis of the basilar artery. No significant stenosis of the vertebral arteries. OTHER: No dural venous sinus thrombosis on this non-dedicated study. IMPRESSION: 1. No definite aneurysm or vascular malformation is identified. Abrupt tapering of the V4 segment of both vertebral arteries and the distal basilar artery, concerning for early vasospasm, which could be masking an aneurysm. Code stroke results were communicated to Dr. Matthews at 4:26 pm on 11/20/2023 by phone. Electronically signed by: Ryan Chess MD 11/20/2023 04:33 PM EDT RP Workstation: [id number]    Review of Systems Blood pressure 110/66, pulse 82, temperature 98.9 F (37.2 C), temperature source Oral, resp. rate 20, weight 90 kg, SpO2 100%. Physical Exam Neurological:     Mental Status: She is lethargic.     Cranial Nerves: Cranial nerves 2-12 are intact. No facial asymmetry.     Sensory: Sensation is intact.     Motor: Motor function is intact.     Comments: Lethargic, symmetric grimace to pain, localizing also.  Perrl Not following commands, purposeful with all extremities Would not verbally respond other than grunting and Ow No detailed motor or sensory exam possible.      Assessment/Plan: Andrea Bridges is a 63 y.o. female With large subarachnoid hemorrhage, fisher 4, non aneurysmal as of now. CT angio was  clean. Obstructive hydrocephalus evident on CT. Will need Ventricular catheter placement due to hydrocephalus. I have spoken with her Daughter and explained procedure . We will  proceed after intubation.                            Rockey Peru 11/20/2023, 7:06 PM

## 2023-11-20 NOTE — ED Notes (Signed)
 Ccmd called

## 2023-11-20 NOTE — Consult Note (Signed)
 NEUROLOGY CONSULT NOTE   Date of service: November 20, 2023 [name] MRN:  [id number] DOB:  November 22, 1960 Chief Complaint: Code Stroke Requesting Provider: [name], MD  History of Present Illness  Andrea Bridges is a 63 y.o. female with hx of anxiety, skin cancer, PCOS for whom EMS was called for a headache. She was initially alert and oriented and reporting frontal headache. She was working outside in the yard when she developed the worst headache of her life. Her daughter called EMS and on there arrival she reported a thunder clap headache, but was alert and oriented. She became unresponsive in triage which prompted the Code Stroke activation. On examiners initial arrival she is unresponsive moaning, disconjugate gaze, flaccid on the right, and not following commands.  After initial CT scan she does elevate her left arm to command and begins moving her bilateral lower extremities purposefully. After imaging she does have one episode of vomiting. 2g of keppra  given.   LKW: 1330 Modified rankin score: 0-Completely asymptomatic and back to baseline post- stroke IV Thrombolysis: No, SAH EVT: No, no LVO Hunt Hess: 4 Fischer Score: 4  NIHSS components Score: Comment  1a Level of Conscious 0[]  1[]  2[x]  3[]    Grimaces with painful stimuli  1b LOC Questions 0[]  1[]  2[x]     No verbal output  1c LOC Commands 0[]  1[]  2[x]       2 Best Gaze 0[x]  1[]  2[]       3 Visual 0[x]  1[]  2[]  3[]      4 Facial Palsy 0[x]  1[]  2[]  3[]      5a Motor Arm - left 0[]  1[]  2[]  3[]  4[x]  UN[]  Improved in CT  5b Motor Arm - Right 0[]  1[]  2[]  3[]  4[x]  UN[]  Improved in CT  6a Motor Leg - Left 0[]  1[]  2[]  3[]  4[x]  UN[]  Improved in CT  6b Motor Leg - Right 0[]  1[]  2[]  3[]  4[x]  UN[]  Improved in CT  7 Limb Ataxia 0[x]  1[]  2[]  UN[]      8 Sensory 0[x]  1[]  2[]  UN[]      9 Best Language 0[]  1[]  2[]  3[x]      10 Dysarthria 0[]  1[]  2[x]  UN[]      11 Extinct. and Inattention 0[x]  1[]  2[]       TOTAL:27       ROS    Unable to ascertain due to altered mental status  Past History   Past Medical History:  Diagnosis Date   Acute appendicitis with perforation and localized peritonitis, without abscess or gangrene 12/11/2019   Anxiety    Infertility, female    PCOS (polycystic ovarian syndrome)    Skin cancer    basal cell   SOBOE (shortness of breath on exertion) 09/08/2022    Past Surgical History:  Procedure Laterality Date   APPENDECTOMY  2021   BREAST BIOPSY      Family History: Family History  Problem Relation Age of Onset   Thyroid  disease Mother    Anxiety disorder Mother    Obesity Mother    Cancer Father        skin   Sleep apnea Father     Social History  reports that she has been smoking cigarettes. She has never used smokeless tobacco. She reports that she does not currently use alcohol. She reports that she does not use drugs.  No Known Allergies  Medications   Current Facility-Administered Medications:    [START ON 11/21/2023]  stroke: early stages of recovery book, , Does not apply, Once, Claudene,  Toribio BROCKS, MD   0.9 %  sodium chloride  infusion, , Intravenous, Continuous, [name], MD, Last Rate: 100 mL/hr at 11/20/23 1747, New Bag at 11/20/23 1747   acetaminophen  (TYLENOL ) tablet 650 mg, 650 mg, Oral, Q4H PRN **OR** acetaminophen  (TYLENOL ) 160 MG/5ML solution 650 mg, 650 mg, Per Tube, Q4H PRN **OR** acetaminophen  (TYLENOL ) suppository 650 mg, 650 mg, Rectal, Q4H PRN, [name], MD   Chlorhexidine  Gluconate Cloth 2 % PADS 6 each, 6 each, Topical, Daily, [name], MD   clevidipine  (CLEVIPREX ) infusion 0.5 mg/mL, 0-21 mg/hr, Intravenous, Continuous, Remi, Devon, NP   docusate (COLACE) 50 MG/5ML liquid 100 mg, 100 mg, Per Tube, BID, [name], MD   fentaNYL  (SUBLIMAZE ) injection 50 mcg, 50 mcg, Intravenous, Q15 min PRN, [name], MD   fentaNYL  (SUBLIMAZE ) injection 50-200 mcg, 50-200 mcg, Intravenous, Q30 min PRN, [name], MD, 100  mcg at 11/20/23 1823   insulin  aspart (novoLOG ) injection 0-9 Units, 0-9 Units, Subcutaneous, Q4H, [name], MD   levETIRAcetam  (KEPPRA ) undiluted injection 750 mg, 750 mg, Intravenous, Q12H, Shafer, Devon, NP, 750 mg at 11/20/23 1734   niMODipine  (NIMOTOP ) capsule 60 mg, 60 mg, Oral, Q4H **OR** niMODipine  (NYMALIZE ) 6 MG/ML oral solution 60 mg, 60 mg, Per Tube, Q4H, [name], MD   ondansetron  (ZOFRAN ) injection 4 mg, 4 mg, Intravenous, Q6H PRN, Remi Pippin, NP, 4 mg at 11/20/23 1641   pantoprazole  (PROTONIX ) EC tablet 40 mg, 40 mg, Oral, Daily **OR** pantoprazole  (PROTONIX ) injection 40 mg, 40 mg, Intravenous, Daily, [name], MD   polyethylene glycol (MIRALAX  / GLYCOLAX ) packet 17 g, 17 g, Per Tube, Daily, [name], MD   potassium chloride  (KLOR-CON ) packet 40 mEq, 40 mEq, Per Tube, Once, [name], MD   propofol  (DIPRIVAN ) 1000 MG/100ML infusion, , , ,    propofol  (DIPRIVAN ) 1000 MG/100ML infusion, 0-80 mcg/kg/min, Intravenous, Continuous, [name], MD  Vitals   Vitals:   11/20/23 1703 11/20/23 1715 11/20/23 1730 11/20/23 1745  BP:  125/82 125/79 137/84  Pulse:  (!) 55 87 80  Resp:  (!) 23 20 20   Temp: 98.9 F (37.2 C)     TempSrc: Oral     SpO2:  100% 100% 100%  Weight: 90 kg       Body mass index is 31.08 kg/m.   Physical Exam   Constitutional: Appears well-developed and well-nourished.  Psych: Minimal responsive Eyes: No scleral injection.  HENT: No OP obstruction.  Head: Normocephalic.  Cardiovascular: Normal rate and regular rhythm.  Respiratory: Effort normal, non-labored breathing.  GI: Soft.  No distension. There is no tenderness.  Skin: WDI.   Neurologic Examination   Neuro: Mental Status: Patient is minimally responsive.  Grimaces with noxious stimuli.  This does improve after CT scan when she does follow commands with the left arm.  No verbal output during any of exam Cranial Nerves: II: Pupils are equal, round,  and reactive to light.   III,IV, VI: On initial exam gaze does appear disconjugate with a right INO, this does improve after CT scan. VII: Facial movement is symmetric resting and smiling VIII: Hearing is intact to voice X: Palate elevates symmetrically XI: Shoulder shrug is symmetric. XII: Tongue protrudes midline without atrophy or fasciculations.  Motor: Tone is normal. Bulk is normal.  Initially all extremities are flaccid On repeat exam she does elevate her left upper extremity to command and she does maintain antigravity strength with her right  upper extremity.  Moves both lower extremities antigravity but does not maintain antigravity strength with elevation. Sensory: Localizes to painful stimuli in all extremities Cerebellar: Unable to complete     Labs/Imaging/Neurodiagnostic studies   CBC:  Recent Labs  Lab December 02, 2023 1630 2023/12/02 1635  WBC 14.7*  --   NEUTROABS 12.1*  --   HGB 12.3 12.2  HCT 37.6 36.0  MCV 83.4  --   PLT 196  --    Basic Metabolic Panel:  Lab Results  Component Value Date   NA 138 12/02/2023   K 3.3 (L) 2023/12/02   CO2 19 (L) December 02, 2023   GLUCOSE 172 (H) 02-Dec-2023   BUN 24 (H) 02-Dec-2023   CREATININE 0.70 12/02/23   CALCIUM 8.2 (L) December 02, 2023   GFRNONAA >60 02-Dec-2023   Lipid Panel:  Lab Results  Component Value Date   LDLCALC 151 (H) 07/25/2023   HgbA1c:  Lab Results  Component Value Date   HGBA1C 5.5 07/25/2023   Urine Drug Screen: No results found for: LABOPIA, COCAINSCRNUR, LABBENZ, AMPHETMU, THCU, LABBARB  Alcohol Level     Component Value Date/Time   ETH <15 December 02, 2023 1630   INR  Lab Results  Component Value Date   INR 1.2 December 02, 2023   APTT  Lab Results  Component Value Date   APTT 25 12-02-23   AED levels: No results found for: PHENYTOIN, ZONISAMIDE, LAMOTRIGINE, LEVETIRACETA  CT Head without contrast(Personally reviewed): Diffuse basilar subarachnoid hemorrhage with intraventricular  extension and early ventriculomegaly, greatest along the left cerebellopontine angle cistern, possibly indicating the site of origin.  CT angio Head and Neck with contrast(Personally reviewed): No definite aneurysm or vascular malformation is identified. Abrupt tapering of the V4 segment of both vertebral arteries and the distal basilar artery, concerning for early vasospasm, which could be masking an aneurysm.   ASSESSMENT   Syrai Gladwin is a 63 y.o. female with past medical history of anxiety, PCOS and skin cancer who presented with a thunderclap headache and a rapidly decompensated mental status.  CCM to admit. Neurosurgery called for hydrocephalous and SAH management and they have placed a ventriculostomy drain. Based on appearance of SAH, it appears aneurysmal however, no aneurysm identified on CTA however, concerning for early vasospasm.  Neuro IR planning a catheter antiogram tomorrow  Intubated in the ED for airway protection after episode of vomiting.  RECOMMENDATIONS  - BP goal 130-150 for 24 hours  - Cleviprex  - CCM called - EVD management per neurosurgery - Neuro IR consulted; plan for catheter angiogram tomorrow - Nimodipine  60mg  q4 x 21 days - 2 g Keppra  load and 500 mg twice daily ordered for seizure prophylaxis  Stroke team will follow starting tomorrow   Attending Neurohospitalist Addendum Patient seen and examined with APP/Resident. Agree with the history and physical as documented above. Agree with the plan as documented, which I helped formulate. I have edited the note above to reflect my full findings and recommendations. I have independently reviewed the chart, obtained history, review of systems and examined the patient.I have personally reviewed pertinent head/neck/spine imaging (CT/MRI). Please feel free to call with any questions.  This patient is critically ill and at significant risk of neurological worsening, death and care requires constant monitoring of  vital signs, hemodynamics,respiratory and cardiac monitoring, neurological assessment, discussion with family, other specialists and medical decision making of high complexity. I spent 100 minutes of neurocritical care time  in the care of  this patient. This was time spent independent of any time provided  by nurse practitioner or PA.  Elida Ross, MD [facility] [phone]  If 7pm- 7am, please page neurology on call as listed in AMION.

## 2023-11-21 ENCOUNTER — Inpatient Hospital Stay (HOSPITAL_COMMUNITY)

## 2023-11-21 DIAGNOSIS — R569 Unspecified convulsions: Secondary | ICD-10-CM

## 2023-11-21 DIAGNOSIS — J9600 Acute respiratory failure, unspecified whether with hypoxia or hypercapnia: Secondary | ICD-10-CM | POA: Diagnosis not present

## 2023-11-21 DIAGNOSIS — F17210 Nicotine dependence, cigarettes, uncomplicated: Secondary | ICD-10-CM | POA: Diagnosis not present

## 2023-11-21 DIAGNOSIS — I69391 Dysphagia following cerebral infarction: Secondary | ICD-10-CM

## 2023-11-21 DIAGNOSIS — R29714 NIHSS score 14: Secondary | ICD-10-CM

## 2023-11-21 DIAGNOSIS — F17200 Nicotine dependence, unspecified, uncomplicated: Secondary | ICD-10-CM

## 2023-11-21 DIAGNOSIS — I609 Nontraumatic subarachnoid hemorrhage, unspecified: Secondary | ICD-10-CM

## 2023-11-21 DIAGNOSIS — E785 Hyperlipidemia, unspecified: Secondary | ICD-10-CM

## 2023-11-21 DIAGNOSIS — I615 Nontraumatic intracerebral hemorrhage, intraventricular: Secondary | ICD-10-CM

## 2023-11-21 LAB — GLUCOSE, CAPILLARY
Glucose-Capillary: 116 mg/dL — ABNORMAL HIGH (ref 70–99)
Glucose-Capillary: 119 mg/dL — ABNORMAL HIGH (ref 70–99)
Glucose-Capillary: 121 mg/dL — ABNORMAL HIGH (ref 70–99)
Glucose-Capillary: 136 mg/dL — ABNORMAL HIGH (ref 70–99)
Glucose-Capillary: 145 mg/dL — ABNORMAL HIGH (ref 70–99)
Glucose-Capillary: 155 mg/dL — ABNORMAL HIGH (ref 70–99)

## 2023-11-21 LAB — RENAL FUNCTION PANEL
Albumin: 3.7 g/dL (ref 3.5–5.0)
Anion gap: 10 (ref 5–15)
BUN: 14 mg/dL (ref 8–23)
CO2: 21 mmol/L — ABNORMAL LOW (ref 22–32)
Calcium: 8.7 mg/dL — ABNORMAL LOW (ref 8.9–10.3)
Chloride: 104 mmol/L (ref 98–111)
Creatinine, Ser: 0.58 mg/dL (ref 0.44–1.00)
GFR, Estimated: >60 mL/min (ref >60)
Glucose, Bld: 114 mg/dL — ABNORMAL HIGH (ref 70–99)
Phosphorus: 3 mg/dL (ref 2.5–4.6)
Potassium: 4 mmol/L (ref 3.5–5.1)
Sodium: 135 mmol/L (ref 135–145)

## 2023-11-21 LAB — CBC
HCT: 37.8 % (ref 36.0–46.0)
Hemoglobin: 12.7 g/dL (ref 12.0–15.0)
MCH: 27.2 pg (ref 26.0–34.0)
MCHC: 33.6 g/dL (ref 30.0–36.0)
MCV: 80.9 fL (ref 80.0–100.0)
Platelets: 188 K/uL (ref 150–400)
RBC: 4.67 MIL/uL (ref 3.87–5.11)
RDW: 13.6 % (ref 11.5–15.5)
WBC: 12.5 K/uL — ABNORMAL HIGH (ref 4.0–10.5)
nRBC: 0 % (ref 0.0–0.2)

## 2023-11-21 LAB — MAGNESIUM: Magnesium: 2.1 mg/dL (ref 1.7–2.4)

## 2023-11-21 LAB — TRIGLYCERIDES: Triglycerides: 108 mg/dL (ref <150)

## 2023-11-21 LAB — POCT I-STAT 7, (LYTES, BLD GAS, ICA,H+H)
Acid-base deficit: 1 mmol/L (ref 0.0–2.0)
Bicarbonate: 23.3 mmol/L (ref 20.0–28.0)
Calcium, Ion: 1.19 mmol/L (ref 1.15–1.40)
HCT: 36 % (ref 36.0–46.0)
Hemoglobin: 12.2 g/dL (ref 12.0–15.0)
O2 Saturation: 100 %
Patient temperature: 99.9
Potassium: 3.8 mmol/L (ref 3.5–5.1)
Sodium: 138 mmol/L (ref 135–145)
TCO2: 24 mmol/L (ref 22–32)
pCO2 arterial: 36.9 mmHg (ref 32–48)
pH, Arterial: 7.41 (ref 7.35–7.45)
pO2, Arterial: 458 mmHg — ABNORMAL HIGH (ref 83–108)

## 2023-11-21 LAB — HIV ANTIBODY (ROUTINE TESTING W REFLEX): HIV Screen 4th Generation wRfx: NONREACTIVE

## 2023-11-21 LAB — PHOSPHORUS: Phosphorus: 2.3 mg/dL — ABNORMAL LOW (ref 2.5–4.6)

## 2023-11-21 MED ORDER — FENTANYL BOLUS VIA INFUSION: 25.0000 ug | INTRAVENOUS | Status: DC | PRN

## 2023-11-21 MED ORDER — PROSOURCE TF20 ENFIT COMPATIBL EN LIQD
60.0000 mL | Freq: Every day | ENTERAL | Status: DC
  Administered 2023-11-21 – 2023-12-04 (×13): 60 mL
  Filled 2023-11-21 (×14): qty 60

## 2023-11-21 MED ORDER — PROCHLORPERAZINE EDISYLATE 10 MG/2ML IJ SOLN
5.0000 mg | INTRAMUSCULAR | Status: AC | PRN
  Administered 2023-11-21: 5 mg via INTRAVENOUS
  Filled 2023-11-21: qty 2

## 2023-11-21 MED ORDER — NICOTINE 14 MG/24HR TD PT24
14.0000 mg | MEDICATED_PATCH | Freq: Every day | TRANSDERMAL | Status: DC
  Administered 2023-11-21 – 2023-12-09 (×19): 14 mg via TRANSDERMAL
  Filled 2023-11-21 (×18): qty 1

## 2023-11-21 MED ORDER — OSMOLITE 1.5 CAL PO LIQD
1000.0000 mL | ORAL | Status: DC
  Administered 2023-11-21 – 2023-12-03 (×9): 1000 mL

## 2023-11-21 MED ORDER — ORAL CARE MOUTH RINSE: 15.0000 mL | OROMUCOSAL | Status: DC | PRN

## 2023-11-21 MED ORDER — FENTANYL CITRATE PF 50 MCG/ML IJ SOSY: 25.0000 ug | PREFILLED_SYRINGE | Freq: Once | INTRAMUSCULAR | Status: AC

## 2023-11-21 MED ORDER — FENTANYL 2500MCG IN NS 250ML (10MCG/ML) PREMIX INFUSION
0.0000 ug/h | INTRAVENOUS | Status: DC
  Administered 2023-11-21: 50 ug/h via INTRAVENOUS
  Filled 2023-11-21: qty 250

## 2023-11-21 NOTE — Progress Notes (Signed)
 Transition of Care [facility]) - Inpatient Brief Assessment   Patient Details  Name: Andrea Bridges MRN: [id number] Date of Birth: 02/04/1961  [facility]) CM/SW Contact:    Robynn Eileen Hoose, RN Phone Number: 11/21/2023, 4:34 PM   Clinical Narrative:   Patient from home with c/o headache. Patient extubated today and placed on 4 L oxygen via [place]. EEG completed.  Transition of Care Asessment: Insurance and Status: Insurance coverage has been reviewed Patient has primary care physician: Yes Home environment has been reviewed: Home Prior level of function:: Independent Prior/Current Home Services: No current home services   Readmission risk has been reviewed: Yes Transition of care needs: no transition of care needs at this time

## 2023-11-21 NOTE — Procedures (Signed)
 Extubation Procedure Note  Patient Details:   Name: Andrea Bridges DOB: 1960/06/13 MRN: [id number]   Airway Documentation:    Vent end date: 11/21/23 Vent end time: 1234   Evaluation  O2 sats: stable throughout Complications: No apparent complications Patient did tolerate procedure well. Bilateral Breath Sounds: Diminished, Clear   Yes  Positive cuff leak prior to extubation. Placed on 4L [place]  Dena VEAR Samuel 11/21/2023, 12:35 PM

## 2023-11-21 NOTE — Plan of Care (Signed)
  Problem: Spontaneous Subarachnoid Hemorrhage Tissue Perfusion: Goal: Complications of Spontaneous Subarachnoid Hemorrhage will be minimized Outcome: Progressing   Problem: Coping: Goal: Will identify appropriate support needs Outcome: Progressing   Problem: Nutrition: Goal: Risk of aspiration will decrease Outcome: Progressing

## 2023-11-21 NOTE — Progress Notes (Signed)
 Patient ID: Andrea Bridges, female   DOB: [birth year], 63 y.o.   MRN: [id number] BP 112/63   Pulse 96   Temp 99 F (37.2 C)   Resp 20   Ht 5' 7 (1.702 m)   Wt 90 kg   LMP  (LMP Unknown)   SpO2 98%   BMI 31.08 kg/m   Intake/Output Summary (Last 24 hours) at 11/21/2023 1337 Last data filed at 11/21/2023 1200 Gross per 24 hour  Intake 2098.12 ml  Output 941 ml  Net 1157.12 ml   Moving all extremities, lethargic, follows some commands.  Perrl  Ventricular catheter draining well. Cerebral angiogram in 5-6 days Continue ventricular drainage. Improved.

## 2023-11-21 NOTE — Progress Notes (Signed)
 Initial Nutrition Assessment  DOCUMENTATION CODES:  Not applicable  INTERVENTION:  Tube feeds via Cortrak: Start Osmolite 1.5 at 25 mL/hr and advance by 10 mL every 4 hours to goal rate of 55 mL/hr (1320 mL per day) 60 mL ProSource TF20 - Daily Regimen provides 2060 calories, 103 gm protein, and 1006 mL free water daily.  Monitor for diet advancements  NUTRITION DIAGNOSIS:  Inadequate oral intake related to inability to eat as evidenced by NPO status.  GOAL:  Patient will meet greater than or equal to 90% of their needs  MONITOR:  Vent status, Labs, Weight trends, I & O's  REASON FOR ASSESSMENT:  Ventilator   ASSESSMENT:  63 y.o. female presented to the ED with headache. PMH includes anxiety and PCOS. Pt admitted with [facility].  7/13 - Admitted; Op s/p R frontal ventricular catheter placement 7/14 - Extubated; Cortrak placed  Pt extubated after RD visit. Discussed with RN. Plan for Cortrak feeding tube. Pt did pass swallow screen, but no diet has been ordered.   Admit/Current Weight: 90 kg  Nutrition Related Medications: Colace, NovoLog  0-9 units q4h, Nimotop , Protonix , Miralax  Drips Cleviprex  8 mL/hr (384 kcal/day) Fentanyl   Propofol  16.2 mL/hr (428 kcal/day) Labs: Sodium 138, Potassium 3.8, Phosphorus 3.0  CBG: 114-159 mg/dL x 24 hrs    UOP: 549 mL x 24 hrs  R ventricular drain: 167 mL x 24 hrs   NUTRITION - FOCUSED PHYSICAL EXAM:  Flowsheet Row Most Recent Value  Orbital Region No depletion  Upper Arm Region No depletion  Thoracic and Lumbar Region No depletion  Buccal Region Unable to assess  Temple Region No depletion  Clavicle Bone Region No depletion  Clavicle and Acromion Bone Region No depletion  Scapular Bone Region No depletion  Dorsal Hand Unable to assess  Patellar Region No depletion  Anterior Thigh Region No depletion  Posterior Calf Region No depletion  Edema (RD Assessment) None  Hair Reviewed  Eyes Unable to assess  Mouth Unable to assess   Skin Reviewed  Nails Unable to assess   Diet Order:   Diet Order             Diet NPO time specified  Diet effective now                  EDUCATION NEEDS: Not appropriate for education at this time  Skin:  Skin Assessment: Reviewed RN Assessment  Last BM:  Unknown  Height:  Ht Readings from Last 1 Encounters:  11/21/23 5' 7 (1.702 m)   Weight:  Wt Readings from Last 1 Encounters:  11/20/23 90 kg   Ideal Body Weight:  61.4 kg  BMI:  Body mass index is 31.08 kg/m.  Estimated Nutritional Needs:  Kcal:  1900-2100 Protein:  95-115 grams Fluid:  >/= 1.9 L   Nestora Glatter RD, LDN Clinical Dietitian

## 2023-11-21 NOTE — Progress Notes (Signed)
 OT Cancellation Note  Patient Details Name: Andrea Bridges MRN: [id number] DOB: [birth year]   Cancelled Treatment:    Reason Eval/Treat Not Completed: Patient not medically ready;Active bedrest order [facility] found 7/13 at 16:15, assume bed rest 24 hours. Disccused with RN. Cancel for today.)  [name] 11/21/2023, 9:07 AM

## 2023-11-21 NOTE — Progress Notes (Addendum)
 STROKE TEAM PROGRESS NOTE    SIGNIFICANT HOSPITAL EVENTS  7/13: EMS called d/t acute headache CTH shows diffuse SAH with IV extension and ventriculomegaly  INTERIM HISTORY/SUBJECTIVE Family at bedside.  Opens eyes to voice, moves all extremities purposefully Pending routine EEG Cleviprex  gtt, BP goal 130-150 for 24h then <160.  OK from neurological standpoint to extubate.  Per neurosurgery, cerebral angiogram in 5 to 6 days. Ventricular catheter continues to drain well  OBJECTIVE  CBC    Component Value Date/Time   WBC 12.5 (H) 11/21/2023 0121   RBC 4.67 11/21/2023 0121   HGB 12.2 11/21/2023 0232   HGB 13.5 09/08/2022 0854   HCT 36.0 11/21/2023 0232   HCT 43.1 09/08/2022 0854   PLT 188 11/21/2023 0121   PLT 219 09/08/2022 0854   MCV 80.9 11/21/2023 0121   MCV 84 09/08/2022 0854   MCH 27.2 11/21/2023 0121   MCHC 33.6 11/21/2023 0121   RDW 13.6 11/21/2023 0121   RDW 14.7 09/08/2022 0854   LYMPHSABS 1.9 11/20/2023 1630   LYMPHSABS 1.7 09/08/2022 0854   MONOABS 0.5 11/20/2023 1630   EOSABS 0.0 11/20/2023 1630   EOSABS 0.1 09/08/2022 0854   BASOSABS 0.1 11/20/2023 1630   BASOSABS 0.0 09/08/2022 0854    BMET    Component Value Date/Time   NA 138 11/21/2023 0232   NA 139 03/17/2023 0936   K 3.8 11/21/2023 0232   CL 104 11/21/2023 0121   CO2 21 (L) 11/21/2023 0121   GLUCOSE 114 (H) 11/21/2023 0121   BUN 14 11/21/2023 0121   BUN 21 03/17/2023 0936   CREATININE 0.58 11/21/2023 0121   CALCIUM 8.7 (L) 11/21/2023 0121   EGFR 72 03/17/2023 0936   GFRNONAA >60 11/21/2023 0121    IMAGING past 24 hours DG Chest Port 1 View Result Date: 11/21/2023 EXAM: 1 VIEW XRAY OF THE CHEST 11/21/2023 06:06:16 AM COMPARISON: None available. CLINICAL HISTORY: History of ETT. ETT, SAH; ROVER. FINDINGS: LUNGS AND PLEURA: No focal pulmonary opacity. No pulmonary edema. No pleural effusion. No pneumothorax. HEART AND MEDIASTINUM: No acute abnormality of the cardiac and mediastinal  silhouettes. BONES AND SOFT TISSUES: No acute osseous abnormality. LINES AND TUBES: Endotracheal tube terminates 3 cm above the carina. Gastric tube courses along the inferior border of the film. IMPRESSION: 1. No acute cardiopulmonary pathology. 2. Endotracheal tube and gastric tube in appropriate position as described . Electronically signed by: Lonni Necessary MD 11/21/2023 06:10 AM EDT RP Workstation: [id number]   DG Abd 1 View Result Date: 11/20/2023 CLINICAL DATA:  Check gastric catheter placement EXAM: ABDOMEN - 1 VIEW COMPARISON:  None Available. FINDINGS: Gastric catheter extends into the stomach. No free air is seen. No obstructive changes are noted. IMPRESSION: Gastric catheter within the stomach. Electronically Signed   By: Oneil Devonshire M.D.   On: 11/20/2023 20:34   CT HEAD CODE STROKE WO CONTRAST Result Date: 11/20/2023 EXAM: CT HEAD WITHOUT CONTRAST 11/20/2023 04:14:00 PM TECHNIQUE: CT of the head was performed without the administration of intravenous contrast. Automated exposure control, iterative reconstruction, and/or weight based adjustment of the mA/kV was utilized to reduce the radiation dose to as low as reasonably achievable. COMPARISON: None available. CLINICAL HISTORY: Neuro deficit, acute, stroke suspected. Chief complaints; headache; CT HEAD CODE STROKE WO CONTRAST; Neuro deficit, acute, stroke suspected; Deficits- right side weakness, non verbal FINDINGS: BRAIN AND VENTRICLES: Diffuse basilar subarachnoid hemorrhage with intraventricular extension and early ventriculomegaly. The hemorrhage is greatest along the left cerebellopontine angle cistern possibly indicating the site  of origin. Attention on follow up CTA. ORBITS: No acute abnormality. SINUSES: No acute abnormality. SOFT TISSUES AND SKULL: No acute soft tissue abnormality. No skull fracture. IMPRESSION: 1. Diffuse basilar subarachnoid hemorrhage with intraventricular extension and early ventriculomegaly, greatest along  the left cerebellopontine angle cistern, possibly indicating the site of origin. Attention on follow-up CTA. Code stroke results were communicated to Dr. Matthews at 4:15 pm on 11/20/2023 by phone. Electronically signed by: Ryan Chess MD 11/20/2023 04:35 PM EDT RP Workstation: [id number]   CT ANGIO HEAD NECK W WO CM (CODE STROKE) Result Date: 11/20/2023 EXAM: CTA HEAD AND NECK WITH AND WITHOUT 11/20/2023 04:15:00 PM TECHNIQUE: CTA of the head and neck was performed with and without the administration of intravenous contrast. Multiplanar 2D and/or 3D reformatted images are provided for review. Automated exposure control, iterative reconstruction, and/or weight based adjustment of the mA/kV was utilized to reduce the radiation dose to as low as reasonably achievable. Stenosis of the internal carotid arteries measured using NASCET criteria. COMPARISON: None available CLINICAL HISTORY: Neuro deficit, acute, stroke suspected. Chief complaints; headache; CT ANGIO HEAD NECK W WO CM (CODE STROKE); Neuro deficit, acute, stroke suspected. FINDINGS: CTA NECK: AORTIC ARCH AND ARCH VESSELS: No dissection or arterial injury. No significant stenosis of the brachiocephalic or subclavian arteries. CERVICAL CAROTID ARTERIES: No dissection, arterial injury, or hemodynamically significant stenosis by NASCET criteria. Tortuous cervical ICAs. CERVICAL VERTEBRAL ARTERIES: Abrupt tapering of the V4 segment of both vertebral arteries, concerning for early vasospasm, which could be masking an aneurysm. LUNGS AND MEDIASTINUM: Unremarkable. SOFT TISSUES: No acute abnormality. BONES: No acute abnormality. CTA HEAD: ANTERIOR CIRCULATION: No significant stenosis of the internal carotid arteries. No significant stenosis of the anterior cerebral arteries. No significant stenosis of the middle cerebral arteries. No aneurysm. POSTERIOR CIRCULATION: Persistent fetal origin of the right PCA. Abrupt tapering of the distal basilar artery, concerning  for early vasospasm, which could be masking an aneurysm. No significant stenosis of the posterior cerebral arteries. No significant stenosis of the basilar artery. No significant stenosis of the vertebral arteries. OTHER: No dural venous sinus thrombosis on this non-dedicated study. IMPRESSION: 1. No definite aneurysm or vascular malformation is identified. Abrupt tapering of the V4 segment of both vertebral arteries and the distal basilar artery, concerning for early vasospasm, which could be masking an aneurysm. Code stroke results were communicated to Dr. Matthews at 4:26 pm on 11/20/2023 by phone. Electronically signed by: Ryan Chess MD 11/20/2023 04:33 PM EDT RP Workstation: [id number]    Vitals:   11/21/23 0700 11/21/23 0746 11/21/23 0800 11/21/23 0900  BP: 112/65  114/67 111/63  Pulse: 81 81 82 82  Resp: 20 20 20 20   Temp: 99.3 F (37.4 C) 99.3 F (37.4 C) 99.3 F (37.4 C) 99 F (37.2 C)  TempSrc:      SpO2: 100% 100% 100% 100%  Weight:      Height:         PHYSICAL EXAM General:  Alert, critically ill patient in no acute distress CV: Regular rate and rhythm on monitor Respiratory:  Intubated and mechanically ventilated.   NEURO:  Mental Status: sedated, opens eyes to voice.  Speech/Language: intubated. Follows commands.   Cranial Nerves:  PERRL, Blinks to threat bilaterally, EOMI, UTA facial symmetry due to ETT present, Spontaneous antigravity movement in all extremities, withdraws to pain in all extremities  Most Recent NIH: 14.    ASSESSMENT/PLAN  Ms. Andrea Bridges is a 63 y.o. female with history of PCOS, smoking/vaping, obesity, skin  CA, anxiety  who presented with acute onset of headache 7/13.  Imaging revealed large subarachnoid hemorrhage, ventricular drain placed by neurosurgery.  Admitted to critical care for continued medical management.  NIH on Admission: 27.  Diffuse basilar subarachnoid hemorrhage with intraventricular extension Etiology: Possibly due to  posterior circulation aneurysm however no aneurysm identified on CTA,  pending diagnostic angiogram for further exploration. Code Stroke CT head  Diffuse basilar subarachnoid hemorrhage with intraventricular extension and early ventriculomegaly, greatest along the left cerebellopontine angle cistern, possibly indicating the site of origin.  CTA head & neck  No definite aneurysm or vascular malformation is identified. Abrupt tapering of the V4 segment of both vertebral arteries and the distal basilar artery, concerning for early vasospasm, which could be masking an aneurysm.   TCD  (QMWF): PENDING LDL 151 HgbA1c 5.5 VTE prophylaxis - SCD No antithrombotic prior to admission, now on No antithrombotic due to [facility] Continue nimodipine  Continue Keppra  Therapy recommendations:  Pending Disposition:  pending  Hypertension Home meds:  none Unstable Cleviprex  gtt, wean as able Blood Pressure Goal: SBP between 130-150 for 24 hours and then less than 160   Hyperlipidemia Home meds:  none LDL 151, goal < 70 Add Lipitor 80 mg on discharge  Continue statin at discharge  Tobacco Abuse Patient smokes or vapes daily      Ready to quit? N/A, revisit when able to participate.  Nicotine  replacement therapy provided  Substance Abuse UDS pending  Dysphagia Patient has post-stroke dysphagia, SLP consulted    Diet   Diet NPO time specified   Advance diet as tolerated  Other Stroke Risk Factors Obesity, Body mass index is 31.08 kg/m., BMI >/= 30 associated with increased stroke risk, recommend weight loss, diet and exercise as appropriate    Hospital day # 1   Pt seen by Neuro NP/APP with MD. Note/plan to be edited by MD as needed.    Rocky JAYSON Likes, DNP, AGACNP-BC [facility] Please use AMION for contact information & EPIC for messaging.  I have personally obtained history,examined this patient, reviewed notes, independently viewed imaging studies, participated in medical  decision making and plan of care.ROS completed by me personally and pertinent positives fully documented  I have made any additions or clarifications directly to the above note. Agree with note above.  63 year old lady presented with sudden onset of severe thunderclap headache followed by altered consciousness with CT head showing diffuse basilar subarachnoid hemorrhage with intraventricular extension and early hydrocephalus requiring emergent ventriculostomy.  Neurological exam appears to be improving and she is arousable and following commands.  Continue strict blood pressure control with systolic goal 130-150 for the first 24 hours and then below 160.  Continue ventriculostomy drainage as per neurosurgery.  Continue pneumatic pain and check TCD's Monday ,Wednesday  7 Friday for vasospasm and Keppra  for seizure prophylaxis for now.  Wean off ventilatory support and sedation as tolerated per critical care team.  Long discussion at bedside with the patient's daughter and ex-husband and answered questions about prognosis.  Consider doing diagnostic cerebral catheter angiogram to look for small posterior circulation aneurysm not seen on the CT angio.  Will discuss with neurosurgery.  Discussed with Dr. Jeanell critical care medicine This patient is critically ill and at significant risk of neurological worsening, death and care requires constant monitoring of vital signs, hemodynamics,respiratory and cardiac monitoring, extensive review of multiple databases, frequent neurological assessment, discussion with family, other specialists and medical decision making of high complexity.I have made any additions  or clarifications directly to the above note.This critical care time does not reflect procedure time, or teaching time or supervisory time of PA/NP/Med Resident etc but could involve care discussion time.  I spent 30 minutes of neurocritical care time  in the care of  this patient.     Eather Popp,  MD Medical Director [facility] Pager: [phone] 11/21/2023 3:39 PM  To contact Stroke Continuity provider, please refer to [email or web address]. After hours, contact General Neurology

## 2023-11-21 NOTE — Progress Notes (Signed)
 PT Cancellation Note  Patient Details Name: Andrea Bridges MRN: [id number] DOB: October 19, 1960   Cancelled Treatment:    Reason Eval/Treat Not Completed: Patient not medically ready - intubated, per RN PT hold today.   Marifer Hurd S, PT DPT Acute Rehabilitation Services Secure Chat Preferred  Office [phone]    [name] [name] 11/21/2023, 9:04 AM

## 2023-11-21 NOTE — Progress Notes (Signed)
 NAME:  Andrea Bridges, MRN:  [id number], DOB:  Mar 27, 1961, LOS: 1 ADMISSION DATE:  11/20/2023, CONSULTATION DATE:  11/20/23 REFERRING MD:  EDP, CHIEF COMPLAINT:  headache   History of Present Illness:  63 year old woman w/ hx of smoking, obesity presenting with headache.  Workup reveals large subarachnoid hemorrhage.  CTA neg but V4 of both vertebrals and distal basilar artery taper off concerning for possible early vasospasm and making full eval of this area difficult.  NSGY coming in to consider drain.  Intermittent purposeful movement and occasionally says ow to nursing.  PCCM to admit.  Significant Hospital Events: Including procedures, antibiotic start and stop dates in addition to other pertinent events   11/20/23 admit, EVD placement   Interim History / Subjective:  Patient intubated and sedated overnight.   Objective    Blood pressure 111/63, pulse 82, temperature 99 F (37.2 C), resp. rate 20, height 5' 7 (1.702 m), weight 90 kg, SpO2 100%.    Vent Mode: PRVC FiO2 (%):  [40 %-100 %] 40 % Set Rate:  [20 bmp] 20 bmp Vt Set:  [440 mL-490 mL] 490 mL PEEP:  [5 cmH20] 5 cmH20 Plateau Pressure:  [14 cmH20] 14 cmH20   Intake/Output Summary (Last 24 hours) at 11/21/2023 1004 Last data filed at 11/21/2023 0800 Gross per 24 hour  Intake 1709.56 ml  Output 883 ml  Net 826.56 ml   Filed Weights   11/20/23 1703  Weight: 90 kg    Examination: General: Adult female intubated and sedated  HENT: Pupils round, regular and reactive. EVD present. Orally intubated  Lungs: Good air entry bilaterally  Cardiovascular: RRR, ext warm Abdomen: soft, Non-tender, BS present but diminished  Extremities: no edema Neuro: Patient sedated but responsive to verbal stimuli and intermittently following verbal command GU: foley to be placed   Assessment and Plan   Dx: 1. Nontraumatic SAH with developing hydrocephalus s/p EVD : [facility] 3 EMS, 4 now [facility] 4 2. Acute encephalopathy secondary to  #1 3. Acute respiratory failure due to altered mental status and inability to protect airway 4. Tobacco dependence   Plan: - Continue invasive ventilatory support and will wean as tolerated. Hold sedation and possible SBT today - Case d/w neurology, patient will need cerebral angiogram. Pending neurosurgery evaluation - EEG currently being placed. Discontinue if no evidence of seizures as per neurology - Optimal HTN management as per neurology/ NSG. Patient currently on clevidipine  infusion - Continue Nimodipine  - Continue Keppra  - Maintain euglycemia, normothermia, euvolemia - Daily TCDs  Best Practice (right click and Reselect all SmartList Selections daily)   Diet/type: NPO DVT prophylaxis SCDs Pressure ulcer(s): N/A GI prophylaxis: PPI Lines: Central line Foley:  Yes, and it is still needed Code Status:  full code Last date of multidisciplinary goals of care discussion [daughter/exhusband updated]  Labs   CBC: Recent Labs  Lab 11/20/23 1630 11/20/23 1635 11/21/23 0121 11/21/23 0232  WBC 14.7*  --  12.5*  --   NEUTROABS 12.1*  --   --   --   HGB 12.3 12.2 12.7 12.2  HCT 37.6 36.0 37.8 36.0  MCV 83.4  --  80.9  --   PLT 196  --  188  --     Basic Metabolic Panel: Recent Labs  Lab 11/20/23 1630 11/20/23 1635 11/21/23 0121 11/21/23 0232  NA 136 138 135 138  K 3.2* 3.3* 4.0 3.8  CL 107 105 104  --   CO2 19*  --  21*  --  GLUCOSE 179* 172* 114*  --   BUN 24* 24* 14  --   CREATININE 0.81 0.70 0.58  --   CALCIUM 8.2*  --  8.7*  --   PHOS  --   --  3.0  --    GFR: Estimated Creatinine Clearance: 82.9 mL/min (by C-G formula based on SCr of 0.58 mg/dL). Recent Labs  Lab 11/20/23 1630 11/21/23 0121  WBC 14.7* 12.5*    Liver Function Tests: Recent Labs  Lab 11/20/23 1630 11/21/23 0121  AST 29  --   ALT 20  --   ALKPHOS 53  --   BILITOT 0.8  --   PROT 5.8*  --   ALBUMIN 3.3* 3.7   No results for input(s): LIPASE, AMYLASE in the last 168  hours. No results for input(s): AMMONIA in the last 168 hours.  ABG    Component Value Date/Time   PHART 7.410 11/21/2023 0232   PCO2ART 36.9 11/21/2023 0232   PO2ART 458 (H) 11/21/2023 0232   HCO3 23.3 11/21/2023 0232   TCO2 24 11/21/2023 0232   ACIDBASEDEF 1.0 11/21/2023 0232   O2SAT 100 11/21/2023 0232     Coagulation Profile: Recent Labs  Lab 11/20/23 1630  INR 1.2    Cardiac Enzymes: No results for input(s): CKTOTAL, CKMB, CKMBINDEX, TROPONINI in the last 168 hours.  HbA1C: Hemoglobin A1C  Date/Time Value Ref Range Status  01/06/2023 12:00 AM 5.2  Final   Hgb A1c MFr Bld  Date/Time Value Ref Range Status  07/25/2023 10:23 AM 5.5 4.8 - 5.6 % Final    Comment:             Prediabetes: 5.7 - 6.4          Diabetes: >6.4          Glycemic control for adults with diabetes: <7.0   03/17/2023 09:36 AM 5.6 4.8 - 5.6 % Final    Comment:             Prediabetes: 5.7 - 6.4          Diabetes: >6.4          Glycemic control for adults with diabetes: <7.0     CBG: Recent Labs  Lab 11/20/23 1556 11/20/23 2339 11/21/23 0332 11/21/23 0750  GLUCAP 159* 114* 121* 116*    Addendum: Case was discussed with neurology and neurosurgery.  No plans for cerebral angiogram by neurovascular team today.  Sedation was held and patient following verbal commands.  Passed breathing trial and was successfully extubated.  Continue management as outlined above.   Critical care time:  35 mins independent of procedures     Cynthia Clap, MD, MBA, [facility]- Critical Care

## 2023-11-21 NOTE — Progress Notes (Signed)
 Transcranial Doppler  Date POD PCO2 HCT BP  MCA ACA PCA OPHT SIPH VERT Basilar  7/14 GC     Right  Left   50  51        25  22   14  17   22   *   *  *   *           Right  Left                                            Right  Left                                             Right  Left                                             Right  Left                                            Right  Left                                            Right  Left                                        MCA = Middle Cerebral Artery      OPHT = Opthalmic Artery     BASILAR = Basilar Artery   ACA = Anterior Cerebral Artery     SIPH = Carotid Siphon PCA = Posterior Cerebral Artery   VERT = Verterbral Artery                   Normal MCA = 62+\-12 ACA = 50+\-12 PCA = 42+\-23   Right Lindegaard ratio : 3.33 Left Lindegaard ratio : 3  11/21/23 12:44 PM Cathlyn Collet RVT

## 2023-11-21 NOTE — TOC CAGE-AID Note (Signed)
 Transition of Care [facility]) - CAGE-AID Screening   Patient Details  Name: Andrea Bridges MRN: [id number] Date of Birth: 1961-03-11  Transition of Care [facility]) CM/SW Contact:    Inocente GORMAN Kindle, LCSW Phone Number: 11/21/2023, 11:34 AM   Clinical Narrative: Patient is intubated and currently unable to participate in screening.    CAGE-AID Screening: Substance Abuse Screening unable to be completed due to: : Patient unable to participate

## 2023-11-21 NOTE — Procedures (Signed)
 Cortrak  Tube Type:  Cortrak - 43 inches Tube Location:  Left nare Initial Placement:  Stomach Secured by: Bridle Technique Used to Measure Tube Placement:  Marking at nare/corner of mouth Cortrak Secured At:  75 cm   Cortrak Tube Team Note:  Consult received to place a Cortrak feeding tube.   No x-ray is required. RN may begin using tube.   If the tube becomes dislodged please keep the tube and contact the Cortrak team at [email or web address] for replacement.  If after hours and replacement cannot be delayed, place a NG tube and confirm placement with an abdominal x-ray.    Torrance Freestone MS, RD, LDN If unable to be reached, please send secure chat to "RD inpatient" available from 8:00a-4:00p daily

## 2023-11-21 NOTE — Progress Notes (Signed)
 EEG complete - results pending

## 2023-11-21 NOTE — Procedures (Signed)
 [name]  MRN: [id number]  Epilepsy Attending: Arlin MALVA Krebs  Referring Physician/Provider: Rosemarie Eather RAMAN, MD  Date: 11/21/2023 Duration: 26.33 mins  Patient history: 63yo F with SAH, now with ams. EEG to evaluate for seizure  Level of alertness:  comatose/ lethargic   AEDs during EEG study: LEV, propofol   Technical aspects: This EEG study was done with scalp electrodes positioned according to the 10-20 International system of electrode placement. Electrical activity was reviewed with band pass filter of 1-70Hz , sensitivity of 7 uV/mm, display speed of 28mm/sec with a 60Hz  notched filter applied as appropriate. EEG data were recorded continuously and digitally stored.  Video monitoring was available and reviewed as appropriate.  Description: EEG showed continuous generalized 3 to 6 Hz theta-delta slowing with overriding 13 to 15 Hz beta activity distributed symmetrically and diffusely. Hyperventilation and photic stimulation were not performed.     ABNORMALITY - Continuous slow, generalized - Excessive beta, generalized  IMPRESSION: This study is suggestive of moderate to severe diffuse encephalopathy, nonspecific etiology but likely related to sedation. No seizures or epileptiform discharges were seen throughout the recording.  Ladarrius Bogdanski O Mele Sylvester

## 2023-11-21 NOTE — Progress Notes (Signed)
 SLP Cancellation Note  Patient Details Name: Andrea Bridges MRN: [id number] DOB: [birth year]   Cancelled treatment:       Reason Eval/Treat Not Completed: Patient not medically ready (on vent this am). Will f/u as able.     Leita SAILOR., M.A. CCC-SLP Acute Rehabilitation Services Office: [phone]  Secure chat preferred  11/21/2023, 7:51 AM

## 2023-11-21 NOTE — Procedures (Signed)
 Arterial Catheter Insertion Procedure Note  Andrea Bridges  [id number]  [birth year]  Date:11/21/23  Time:1:11 AM    Provider Performing: Leita SAUNDERS Andrea Bridges    Procedure: Insertion of Arterial Line (63379) with US  guidance (23062)   Indication(s) Blood pressure monitoring and/or need for frequent ABGs  Consent Unable to obtain consent due to emergent nature of procedure.  Anesthesia None   Time Out Verified patient identification, verified procedure, site/side was marked, verified correct patient position, special equipment/implants available, medications/allergies/relevant history reviewed, required imaging and test results available.   Sterile Technique Maximal sterile technique including full sterile barrier drape, hand hygiene, sterile gown, sterile gloves, mask, hair covering, sterile ultrasound probe cover (if used).   Procedure Description Area of catheter insertion was cleaned with chlorhexidine  and draped in sterile fashion. With real-time ultrasound guidance an arterial catheter was placed into the left femoral artery.  Appropriate arterial tracings confirmed on monitor.     Complications/Tolerance None; patient tolerated the procedure well.   EBL Minimal   Specimen(s) None   Leita SAUNDERS Andrea Clinch, PA-C

## 2023-11-22 ENCOUNTER — Inpatient Hospital Stay (HOSPITAL_COMMUNITY)

## 2023-11-22 ENCOUNTER — Other Ambulatory Visit: Payer: Self-pay

## 2023-11-22 DIAGNOSIS — I615 Nontraumatic intracerebral hemorrhage, intraventricular: Secondary | ICD-10-CM | POA: Diagnosis not present

## 2023-11-22 DIAGNOSIS — E785 Hyperlipidemia, unspecified: Secondary | ICD-10-CM | POA: Diagnosis not present

## 2023-11-22 DIAGNOSIS — Z982 Presence of cerebrospinal fluid drainage device: Secondary | ICD-10-CM

## 2023-11-22 DIAGNOSIS — R29714 NIHSS score 14: Secondary | ICD-10-CM | POA: Diagnosis not present

## 2023-11-22 DIAGNOSIS — E878 Other disorders of electrolyte and fluid balance, not elsewhere classified: Secondary | ICD-10-CM

## 2023-11-22 DIAGNOSIS — I609 Nontraumatic subarachnoid hemorrhage, unspecified: Secondary | ICD-10-CM | POA: Diagnosis not present

## 2023-11-22 HISTORY — PX: IR ANGIO VERTEBRAL SEL VERTEBRAL UNI R MOD SED: IMG5368

## 2023-11-22 LAB — GLUCOSE, CAPILLARY
Glucose-Capillary: 112 mg/dL — ABNORMAL HIGH (ref 70–99)
Glucose-Capillary: 120 mg/dL — ABNORMAL HIGH (ref 70–99)
Glucose-Capillary: 122 mg/dL — ABNORMAL HIGH (ref 70–99)
Glucose-Capillary: 127 mg/dL — ABNORMAL HIGH (ref 70–99)
Glucose-Capillary: 127 mg/dL — ABNORMAL HIGH (ref 70–99)
Glucose-Capillary: 131 mg/dL — ABNORMAL HIGH (ref 70–99)
Glucose-Capillary: 139 mg/dL — ABNORMAL HIGH (ref 70–99)

## 2023-11-22 LAB — CBC
HCT: 37.3 % (ref 36.0–46.0)
Hemoglobin: 12.5 g/dL (ref 12.0–15.0)
MCH: 27.3 pg (ref 26.0–34.0)
MCHC: 33.5 g/dL (ref 30.0–36.0)
MCV: 81.4 fL (ref 80.0–100.0)
Platelets: 164 K/uL (ref 150–400)
RBC: 4.58 MIL/uL (ref 3.87–5.11)
RDW: 13.8 % (ref 11.5–15.5)
WBC: 8.9 K/uL (ref 4.0–10.5)
nRBC: 0 % (ref 0.0–0.2)

## 2023-11-22 LAB — MAGNESIUM: Magnesium: 2.2 mg/dL (ref 1.7–2.4)

## 2023-11-22 LAB — RENAL FUNCTION PANEL
Albumin: 3.4 g/dL — ABNORMAL LOW (ref 3.5–5.0)
Anion gap: 9 (ref 5–15)
BUN: 13 mg/dL (ref 8–23)
CO2: 23 mmol/L (ref 22–32)
Calcium: 9.2 mg/dL (ref 8.9–10.3)
Chloride: 107 mmol/L (ref 98–111)
Creatinine, Ser: 0.66 mg/dL (ref 0.44–1.00)
GFR, Estimated: >60 mL/min (ref >60)
Glucose, Bld: 136 mg/dL — ABNORMAL HIGH (ref 70–99)
Phosphorus: 1.8 mg/dL — ABNORMAL LOW (ref 2.5–4.6)
Potassium: 3.4 mmol/L — ABNORMAL LOW (ref 3.5–5.1)
Sodium: 139 mmol/L (ref 135–145)

## 2023-11-22 MED ORDER — ACETAMINOPHEN-CODEINE 300-30 MG PO TABS
1.0000 | ORAL_TABLET | ORAL | Status: DC | PRN
  Administered 2023-11-22 – 2023-11-23 (×2): 1 via ORAL
  Administered 2023-11-23: 2 via ORAL
  Filled 2023-11-22: qty 2
  Filled 2023-11-22 (×2): qty 1

## 2023-11-22 MED ORDER — LIDOCAINE HCL 1 % IJ SOLN
10.0000 mL | Freq: Once | INTRAMUSCULAR | Status: AC
  Administered 2023-11-22: 10 mL via INTRADERMAL
  Filled 2023-11-22: qty 10

## 2023-11-22 MED ORDER — FENTANYL CITRATE (PF) 100 MCG/2ML IJ SOLN
INTRAMUSCULAR | Status: AC | PRN
  Administered 2023-11-22 (×2): 25 ug via INTRAVENOUS

## 2023-11-22 MED ORDER — ENOXAPARIN SODIUM 40 MG/0.4ML IJ SOSY
40.0000 mg | PREFILLED_SYRINGE | Freq: Every evening | INTRAMUSCULAR | Status: DC
  Administered 2023-11-22 – 2023-12-09 (×18): 40 mg via SUBCUTANEOUS
  Filled 2023-11-22 (×18): qty 0.4

## 2023-11-22 MED ORDER — WHITE PETROLATUM EX OINT
TOPICAL_OINTMENT | CUTANEOUS | Status: DC | PRN
  Filled 2023-11-22: qty 28.35

## 2023-11-22 MED ORDER — FENTANYL CITRATE (PF) 100 MCG/2ML IJ SOLN
INTRAMUSCULAR | Status: AC
  Filled 2023-11-22: qty 2

## 2023-11-22 MED ORDER — LIDOCAINE HCL 1 % IJ SOLN
INTRAMUSCULAR | Status: AC
  Filled 2023-11-22: qty 20

## 2023-11-22 MED ORDER — POTASSIUM & SODIUM PHOSPHATES 280-160-250 MG PO PACK
2.0000 | PACK | ORAL | Status: AC
  Administered 2023-11-22 (×3): 2
  Filled 2023-11-22 (×2): qty 2
  Filled 2023-11-22: qty 1

## 2023-11-22 MED ORDER — ORAL CARE MOUTH RINSE: 15.0000 mL | OROMUCOSAL | Status: DC | PRN

## 2023-11-22 MED ORDER — ORAL CARE MOUTH RINSE
15.0000 mL | OROMUCOSAL | Status: DC
  Administered 2023-11-22 – 2023-12-01 (×35): 15 mL via OROMUCOSAL

## 2023-11-22 MED ORDER — IOHEXOL 300 MG/ML  SOLN
150.0000 mL | Freq: Once | INTRAMUSCULAR | Status: AC | PRN
  Administered 2023-11-22: 10 mL via INTRA_ARTERIAL

## 2023-11-22 MED ORDER — OXYCODONE HCL 5 MG PO TABS: 5.0000 mg | ORAL_TABLET | ORAL | Status: DC | PRN

## 2023-11-22 NOTE — Progress Notes (Signed)
 NAME:  Andrea Bridges, MRN:  [id number], DOB:  Feb 19, 1961, LOS: 2 ADMISSION DATE:  11/20/2023, CONSULTATION DATE:  11/20/23 REFERRING MD:  EDP, CHIEF COMPLAINT:  headache   History of Present Illness:  63 year old woman w/ hx of smoking, obesity presenting with headache.  Workup reveals large subarachnoid hemorrhage.  CTA neg but V4 of both vertebrals and distal basilar artery taper off concerning for possible early vasospasm and making full eval of this area difficult.  NSGY coming in to consider drain.  Intermittent purposeful movement and occasionally says ow to nursing.  PCCM to admit.  Significant Hospital Events: Including procedures, antibiotic start and stop dates in addition to other pertinent events   11/20/23 admit, EVD placement 7/13 CT head>>  1. Diffuse basilar subarachnoid hemorrhage with intraventricular extension and early ventriculomegaly, greatest along the left cerebellopontine angle cistern, possibly indicating the site of origin. 7/14 EEG>>This study is suggestive of moderate to severe diffuse encephalopathy, nonspecific etiology but likely related to sedation. No seizures or epileptiform discharges were seen throughout the recording.    Interim History / Subjective:  Extubated yesterday  Ongoing ventricular drain per nsgy  C/o HA  To IR this am for angiogram - aborted early when she tried to get up and sheath was pulled but no abnormality seen on R vertebral artery infection   Objective    Blood pressure 117/65, pulse (!) 59, temperature 98.6 F (37 C), temperature source Bladder, resp. rate 20, height 5' 7 (1.702 m), weight 87.2 kg, SpO2 95%.        Intake/Output Summary (Last 24 hours) at 11/22/2023 1124 Last data filed at 11/22/2023 0830 Gross per 24 hour  Intake 1333.74 ml  Output 2838 ml  Net -1504.26 ml   Filed Weights   11/20/23 1703 11/22/23 0500  Weight: 90 kg 87.2 kg    Examination: General: Adult female slightly drowsy post IR procedure  HENT:  Pupils round, regular and reactive. EVD present. Mm moist, coretrak Lungs: resps even non labored on RA, Good air entry bilaterally  Cardiovascular: RRR, ext warm Abdomen: soft, Non-tender, BS present but diminished  Extremities: no edema Neuro: drowsy post procedure but wakes easily, follows commands, MAE    Assessment and Plan   Nontraumatic SAH with hydrocephalus s/p EVD 7/13 Per nsgy  Continue EVD  Defer PT/OT to nsgy with EVD in place  IR angiogram this morning  Continue keprra  TCDs  BP control - cleviprex  just weaned to off - monitor for further BP needs  Nimodipine   Defer BP goals to nsgy   Acute respiratory failure due to altered mental status and inability to protect airway - resolved. Extubated 7/14 PT/OT when able  Supplemental O2 if needed  Pulmonary hygiene  Intermittent f/u CXR  Electrolyte abnormalities  Replete K, phos  F/u chem    Best Practice (right click and Reselect all SmartList Selections daily)   Diet/type: tubefeeds DVT prophylaxis SCDs Pressure ulcer(s): N/A GI prophylaxis: PPI Lines: Central line, Arterial Line, and No longer needed.  Order written to d/c  - d/c aline Foley:  removal ordered  Code Status:  full code Last date of multidisciplinary goals of care discussion [daughter/exhusband updated]  Labs   CBC: Recent Labs  Lab 11/20/23 1630 11/20/23 1635 11/21/23 0121 11/21/23 0232 11/22/23 0641  WBC 14.7*  --  12.5*  --  8.9  NEUTROABS 12.1*  --   --   --   --   HGB 12.3 12.2 12.7 12.2 12.5  HCT 37.6 36.0 37.8 36.0 37.3  MCV 83.4  --  80.9  --  81.4  PLT 196  --  188  --  164    Basic Metabolic Panel: Recent Labs  Lab 11/20/23 1630 11/20/23 1635 11/21/23 0121 11/21/23 0232 11/21/23 1854 11/22/23 0642  NA 136 138 135 138  --  139  K 3.2* 3.3* 4.0 3.8  --  3.4*  CL 107 105 104  --   --  107  CO2 19*  --  21*  --   --  23  GLUCOSE 179* 172* 114*  --   --  136*  BUN 24* 24* 14  --   --  13  CREATININE 0.81 0.70  0.58  --   --  0.66  CALCIUM 8.2*  --  8.7*  --   --  9.2  MG  --   --   --   --  2.1 2.2  PHOS  --   --  3.0  --  2.3* 1.8*   GFR: Estimated Creatinine Clearance: 81.6 mL/min (by C-G formula based on SCr of 0.66 mg/dL). Recent Labs  Lab 11/20/23 1630 11/21/23 0121 11/22/23 0641  WBC 14.7* 12.5* 8.9    Liver Function Tests: Recent Labs  Lab 11/20/23 1630 11/21/23 0121 11/22/23 0642  AST 29  --   --   ALT 20  --   --   ALKPHOS 53  --   --   BILITOT 0.8  --   --   PROT 5.8*  --   --   ALBUMIN 3.3* 3.7 3.4*   No results for input(s): LIPASE, AMYLASE in the last 168 hours. No results for input(s): AMMONIA in the last 168 hours.  ABG    Component Value Date/Time   PHART 7.410 11/21/2023 0232   PCO2ART 36.9 11/21/2023 0232   PO2ART 458 (H) 11/21/2023 0232   HCO3 23.3 11/21/2023 0232   TCO2 24 11/21/2023 0232   ACIDBASEDEF 1.0 11/21/2023 0232   O2SAT 100 11/21/2023 0232     Coagulation Profile: Recent Labs  Lab 11/20/23 1630  INR 1.2    Cardiac Enzymes: No results for input(s): CKTOTAL, CKMB, CKMBINDEX, TROPONINI in the last 168 hours.  HbA1C: Hemoglobin A1C  Date/Time Value Ref Range Status  01/06/2023 12:00 AM 5.2  Final   Hgb A1c MFr Bld  Date/Time Value Ref Range Status  07/25/2023 10:23 AM 5.5 4.8 - 5.6 % Final    Comment:             Prediabetes: 5.7 - 6.4          Diabetes: >6.4          Glycemic control for adults with diabetes: <7.0   03/17/2023 09:36 AM 5.6 4.8 - 5.6 % Final    Comment:             Prediabetes: 5.7 - 6.4          Diabetes: >6.4          Glycemic control for adults with diabetes: <7.0     CBG: Recent Labs  Lab 11/21/23 1553 11/21/23 1946 11/21/23 2347 11/22/23 0341 11/22/23 0753  GLUCAP 136* 145* 119* 122* 127*     Critical care time:      Rockie Myers, NP Pulmonary/Critical Care Medicine  11/22/2023  11:24 AM  See Tracey for personal pager PCCM on call pager [phone] until  7pm. Please call Elink 7p-7a. [phone]

## 2023-11-22 NOTE — Op Note (Signed)
  ENDOVASCULAR NEUROSURGERY OPERATIVE NOTE   PROCEDURE: Diagnostic Cerebral Angiogram   SURGEON:   Dr. Gerldine Maizes, MD  HISTORY:   The patient is a 63 y.o. yo female admitted with sudden onset severe headache, obtundation, and CT scan demonstrating diffuse subarachnoid hemorrhage.  CT angiogram was negative for intracranial aneurysm however given the degree of hemorrhage and presence of hydrocephalus, there is high suspicion for underlying vascular malformation/aneurysm.  She therefore presents for diagnostic cerebral angiogram.  APPROACH:   The technical aspects of the procedure as well as its potential risks and benefits were reviewed with the patient and her husband. These risks included but were not limited bleeding, infection, allergic reaction, damage to organs/vital structures, stroke, non-diagnostic procedure, and the catastrophic outcomes of heart attack, coma, and death. With an understanding of these risks, informed consent was obtained and witnessed.    The patient was placed in the supine position on the angiography table and the skin of right groin prepped in the usual sterile fashion. The procedure was performed under local anesthesia (1%-solution of bicarbonate-bufferred Lidoacaine) and fentanyl  monitored by the in-suite nurse and myself, including non-invasive blood pressure and continuous pulse oxymetry.    Access to the right common femoral artery was obtained using standard Seldinger technique.  A short 5 French sheath was introduced and connected to continuous heparinized saline flush.  HEPARIN:  0 Units total.    CONTRAST AGENT:  See IR records   FLUOROSCOPY TIME:  See IR records    CATHETER(S) AND WIRE(S):    5-French JB-1 glidecatheter   0.035" glidewire    VESSELS CATHETERIZED:   Right vertebral   Right common femoral  VESSELS STUDIED:   Right vertebral Right femoral  PROCEDURAL NARRATIVE:   A 5-Fr JB-1 terumo glide catheter was advanced over a  0.035 glidewire into the aortic arch.  Initially the right vertebral artery was selectively catheterized and cerebral angiogram taken.  There was technical malfunction of the biplane unit.  The system was restarted.  During the reboot, the patient attempted to get up from the angiogram table and her right femoral sheath was inadvertently removed.  We therefore aborted the procedure at this point.  INTERPRETATION:   Right vertebral:   Injection reveals the presence of a widely patent vertebral artery. This leads to a widely patent basilar artery that terminates in bilateral P1. The basilar apex is normal. No aneurysms, arteriovenous malformations, or high flow fistulas are visualized.  There is no posterior circulation vasospasm seen.  The parenchymal and venous phases are normal. The venous sinuses are patent.    DISPOSITION:  After the procedure was aborted and the patient's sheath was removed, hemostasis was secured with manual compression.  Good proximal and distal lower extremity pulses were documented upon achievement of hemostasis.  Patient was then transferred back to the intensive care unit for further care.  IMPRESSION:  1.  Prematurely aborted diagnostic cerebral angiogram, however no aneurysms, AVMs, or fistulas are seen on the sole right vertebral artery injection.    Gerldine Maizes, MD [facility]

## 2023-11-22 NOTE — Progress Notes (Signed)
 SLP Cancellation Note  Patient Details Name: Andrea Bridges MRN: [id number] DOB: 05-07-1961   Cancelled treatment:        Attempted to see pt for cognitive linguistic assessment. Pt extubated yesterday and per nsgy note, pt speaking fluently and appropriately.  Unfortunately, pt was off floor at time of attempt for procedure.  SLP will reattempt as schedule permits.   Anette FORBES Grippe, MA, CCC-SLP Acute Rehabilitation Services Office: [phone] 11/22/2023, 9:58 AM

## 2023-11-22 NOTE — Progress Notes (Signed)
 PT Cancellation Note  Patient Details Name: Andrea Bridges MRN: [id number] DOB: May 04, 1961   Cancelled Treatment:    Reason Eval/Treat Not Completed: Active bedrest order  Noted PT order per CCM, however pt has a bedrest order related to neuro drain in place. Will await clarification from neurosurgery if pt is ready for PT evaluation.    Macario RAMAN, PT Acute Rehabilitation Services  Office [phone]  Macario SHAUNNA Soja 11/22/2023, 8:39 AM

## 2023-11-22 NOTE — Progress Notes (Signed)
 Patient ID: Andrea Bridges, female   DOB: 10/20/1960, 63 y.o.   MRN: [id number] BP (!) 99/52   Pulse 69   Temp 98.1 F (36.7 C) (Axillary)   Resp 16   Ht 5' 7 (1.702 m)   Wt 87.2 kg   LMP  (LMP Unknown)   SpO2 94%   BMI 30.11 kg/m  Has been alert, following some commands. Speech is clear and fluent Moving all extremities Perrl,full eom Tongue and uvula midline.  Repeat angio is negative. There is no reason to suspect a post fossa aneurysm at this time. Nothing about the blood pattern, nor her clinical course leads to a high suspicion for a posterior fossa aneurysm which is being obscured by the [facility]. As is the norm, another angio will be done in 5-6 days to let the blood dissipate making it easier to discern a possible aneurysm if one is present.  Ventricular catheter in place and continues to drain.

## 2023-11-22 NOTE — Progress Notes (Signed)
 Matilynn was transported to IR via bed with transport around 0835. EVD clamped for transport. Report given to Wellington MOTTO RN at bedside. Patient returned to 4N ICU at 1035. EVD unclamped at this time.

## 2023-11-22 NOTE — Progress Notes (Signed)
 OT Cancellation Note  Patient Details Name: Andrea Bridges MRN: [id number] DOB: [birth year]   Cancelled Treatment:    Reason Eval/Treat Not Completed: Active bedrest order (BR with HOB to 30 degree order, pt also going for angiogram. OT to follow up when activity orders progress.)  [name] 11/22/2023, 8:57 AM

## 2023-11-22 NOTE — Progress Notes (Signed)
  NEUROSURGERY PROGRESS NOTE   No issues overnight. Pt extubated yesterday, doing well. Reports some HA this am, otherwise no new N/T/W.  EXAM:  BP (!) 106/59 (BP Location: Right Arm)   Pulse 73   Temp 99.3 F (37.4 C)   Resp 17   Ht 5' 7 (1.702 m)   Wt 87.2 kg   LMP  (LMP Unknown)   SpO2 96%   BMI 30.11 kg/m   Awake, alert, oriented  Speech fluent, appropriate  CN grossly intact  5/5 BUE/BLE  EVD in place, patent  IMPRESSION:  63 y.o. female SAH d#2, initial CTA negative but significant amount of blood and HCP requiring EVD  PLAN: - Proceed with diagnostic cerebral angiogram  I have reviewed the indications for the procedure as well as the details of the procedure and the expected postoprocedure course and recovery at length with the patient and her husband. We have also reviewed in detail the risks, benefits, and alternatives to the procedure. All questions were answered and Danean Marner provided informed consent to proceed.   Gerldine Maizes, MD [facility]

## 2023-11-22 NOTE — Progress Notes (Signed)
 STROKE TEAM PROGRESS NOTE    SIGNIFICANT HOSPITAL EVENTS  7/13: EMS called d/t acute headache CTH shows diffuse SAH with IV extension and ventriculomegaly  INTERIM HISTORY/SUBJECTIVE Family at bedside.  Opens eyes to voice, moves all extremities purposefully Pending routine EEG Cleviprex  gtt, BP goal 130-150 for 24h then <160.  OK from neurological standpoint to extubate.  Per neurosurgery, cerebral angiogram in 5 to 6 days. Ventricular catheter continues to drain well  OBJECTIVE Patient had gone for diagnostic cerebral catheter angiogram but unfortunately the procedure had to be aborted after the right vertebral injection only as patient became agitated uncooperative and started pulling at the arterial sheath. however no aneurysms, AVMs, or fistulas are seen on the sole right vertebral artery injection.  No vasospasm is noted in the posterior circulation vessels.  Left vertebral artery was not visualized.   Patient is sleepy but easily arousable.  She is oriented x 3.  She follows commands well.  She can planes of moderate 5/10 in severity headache.  She feels Tylenol  alone may not be helping but she does not want stronger narcotics. Ventriculostomy catheter is draining well at 15 cc an hour.  CSF is still bloody. CBC    Component Value Date/Time   WBC 8.9 11/22/2023 0641   RBC 4.58 11/22/2023 0641   HGB 12.5 11/22/2023 0641   HGB 13.5 09/08/2022 0854   HCT 37.3 11/22/2023 0641   HCT 43.1 09/08/2022 0854   PLT 164 11/22/2023 0641   PLT 219 09/08/2022 0854   MCV 81.4 11/22/2023 0641   MCV 84 09/08/2022 0854   MCH 27.3 11/22/2023 0641   MCHC 33.5 11/22/2023 0641   RDW 13.8 11/22/2023 0641   RDW 14.7 09/08/2022 0854   LYMPHSABS 1.9 11/20/2023 1630   LYMPHSABS 1.7 09/08/2022 0854   MONOABS 0.5 11/20/2023 1630   EOSABS 0.0 11/20/2023 1630   EOSABS 0.1 09/08/2022 0854   BASOSABS 0.1 11/20/2023 1630   BASOSABS 0.0 09/08/2022 0854    BMET    Component Value Date/Time   NA  139 11/22/2023 0642   NA 139 03/17/2023 0936   K 3.4 (L) 11/22/2023 0642   CL 107 11/22/2023 0642   CO2 23 11/22/2023 0642   GLUCOSE 136 (H) 11/22/2023 0642   BUN 13 11/22/2023 0642   BUN 21 03/17/2023 0936   CREATININE 0.66 11/22/2023 0642   CALCIUM 9.2 11/22/2023 0642   EGFR 72 03/17/2023 0936   GFRNONAA >60 11/22/2023 0642    IMAGING past 24 hours No results found.   Vitals:   11/22/23 1035 11/22/23 1100 11/22/23 1200 11/22/23 1300  BP: 117/65 110/61 119/61 126/71  Pulse: (!) 59 60 62 81  Resp: 20 15 17 18   Temp: 98.6 F (37 C) 98.6 F (37 C) 99.7 F (37.6 C) 99.9 F (37.7 C)  TempSrc: Bladder     SpO2: 95% 96% 95% 95%  Weight:      Height:         PHYSICAL EXAM General:  Alert, critically ill patient in no acute distress CV: Regular rate and rhythm on monitor Respiratory:  Intubated and mechanically ventilated.   NEURO:  Mental Status: sedated, opens eyes to voice.  Speech/Language: intubated. Follows commands.   Cranial Nerves:  PERRL, Blinks to threat bilaterally, EOMI, UTA facial symmetry due to ETT present, Spontaneous antigravity movement in all extremities, withdraws to pain in all extremities  Most Recent NIH: 14.    ASSESSMENT/PLAN  Ms. Andrea Bridges is a 63 y.o. female with  history of PCOS, smoking/vaping, obesity, skin CA, anxiety  who presented with acute onset of headache 7/13.  Imaging revealed large subarachnoid hemorrhage, ventricular drain placed by neurosurgery.  Admitted to critical care for continued medical management.  NIH on Admission: 27.  Diffuse basilar subarachnoid hemorrhage with intraventricular extension Etiology: Possibly due to posterior circulation aneurysm however no aneurysm identified on CTA, and on limited right vertebral artery injection on catheter angiogram Code Stroke CT head  Diffuse basilar subarachnoid hemorrhage with intraventricular extension and early ventriculomegaly, greatest along the left cerebellopontine  angle cistern, possibly indicating the site of origin.  CTA head & neck  No definite aneurysm or vascular malformation is identified. Abrupt tapering of the V4 segment of both vertebral arteries and the distal basilar artery, concerning for early vasospasm, which could be masking an aneurysm.  11/22/2023 :Limited cerebral catheter angiogram right vertebral injection only no evidence of vasospasm, aneurysm or AVM TCD  (QMWF): PENDING LDL 151 HgbA1c 5.5 VTE prophylaxis - SCD No antithrombotic prior to admission, now on No antithrombotic due to [facility] Continue nimodipine  Continue Keppra  Therapy recommendations:  Pending Disposition:  pending  Hypertension Home meds:  none Unstable Cleviprex  gtt, wean as able Blood Pressure Goal: SBP between 130-150 for 24 hours and then less than 160   Hyperlipidemia Home meds:  none LDL 151, goal < 70 Add Lipitor 80 mg on discharge  Continue statin at discharge  Tobacco Abuse Patient smokes or vapes daily      Ready to quit? N/A, revisit when able to participate.  Nicotine  replacement therapy provided  Substance Abuse UDS pending  Dysphagia Patient has post-stroke dysphagia, SLP consulted    Diet   Diet NPO time specified   Advance diet as tolerated  Other Stroke Risk Factors Obesity, Body mass index is 30.11 kg/m., BMI >/= 30 associated with increased stroke risk, recommend weight loss, diet and exercise as appropriate    Hospital day # 78     63 year old lady presented with sudden onset of severe thunderclap headache followed by altered consciousness with CT head showing diffuse basilar subarachnoid hemorrhage with intraventricular extension and early hydrocephalus requiring emergent ventriculostomy.  Neurological exam appears to be improving and she is arousable and following commands.  Continue strict blood pressure control with systolic goal now below 160.  Continue ventriculostomy drainage as per neurosurgery.  Nimodipine  and check  TCD's Monday ,Wednesday  7 Friday for vasospasm and Keppra  for seizure prophylaxis for now.  Change Tylenol  to Tylenol  No. 3 for headache.   Consider reattempting diagnostic cerebral catheter angiogram to look for small posterior circulation aneurysm not seen on the CT angio later and patient more cooperative..  Discussed with Dr. Lanis neurosurgeon.  Discussed with Dr. Harold critical care medicine Long discussion with patient's ex-husband and daughter at the bedside and answered questions. This patient is critically ill and at significant risk of neurological worsening, death and care requires constant monitoring of vital signs, hemodynamics,respiratory and cardiac monitoring, extensive review of multiple databases, frequent neurological assessment, discussion with family, other specialists and medical decision making of high complexity.I have made any additions or clarifications directly to the above note.This critical care time does not reflect procedure time, or teaching time or supervisory time of PA/NP/Med Resident etc but could involve care discussion time.  I spent 30 minutes of neurocritical care time  in the care of  this patient.      Eather Popp, MD Medical Director [facility] Pager: [phone] 11/22/2023 2:11 PM  To contact Stroke Continuity provider, please refer to [email or web address]. After hours, contact General Neurology

## 2023-11-23 ENCOUNTER — Encounter (HOSPITAL_COMMUNITY): Payer: Self-pay

## 2023-11-23 ENCOUNTER — Inpatient Hospital Stay (HOSPITAL_COMMUNITY)

## 2023-11-23 ENCOUNTER — Ambulatory Visit (INDEPENDENT_AMBULATORY_CARE_PROVIDER_SITE_OTHER): Admitting: Family Medicine

## 2023-11-23 DIAGNOSIS — I609 Nontraumatic subarachnoid hemorrhage, unspecified: Secondary | ICD-10-CM | POA: Diagnosis not present

## 2023-11-23 DIAGNOSIS — R1310 Dysphagia, unspecified: Secondary | ICD-10-CM

## 2023-11-23 DIAGNOSIS — R29714 NIHSS score 14: Secondary | ICD-10-CM | POA: Diagnosis not present

## 2023-11-23 DIAGNOSIS — I615 Nontraumatic intracerebral hemorrhage, intraventricular: Secondary | ICD-10-CM | POA: Diagnosis not present

## 2023-11-23 DIAGNOSIS — E785 Hyperlipidemia, unspecified: Secondary | ICD-10-CM | POA: Diagnosis not present

## 2023-11-23 DIAGNOSIS — R519 Headache, unspecified: Secondary | ICD-10-CM | POA: Diagnosis not present

## 2023-11-23 DIAGNOSIS — R739 Hyperglycemia, unspecified: Secondary | ICD-10-CM | POA: Diagnosis not present

## 2023-11-23 LAB — MAGNESIUM: Magnesium: 2.2 mg/dL (ref 1.7–2.4)

## 2023-11-23 LAB — RENAL FUNCTION PANEL
Albumin: 3.4 g/dL — ABNORMAL LOW (ref 3.5–5.0)
Anion gap: 13 (ref 5–15)
BUN: 16 mg/dL (ref 8–23)
CO2: 26 mmol/L (ref 22–32)
Calcium: 9.5 mg/dL (ref 8.9–10.3)
Chloride: 105 mmol/L (ref 98–111)
Creatinine, Ser: 0.71 mg/dL (ref 0.44–1.00)
GFR, Estimated: >60 mL/min (ref >60)
Glucose, Bld: 124 mg/dL — ABNORMAL HIGH (ref 70–99)
Phosphorus: 2.9 mg/dL (ref 2.5–4.6)
Potassium: 4 mmol/L (ref 3.5–5.1)
Sodium: 144 mmol/L (ref 135–145)

## 2023-11-23 LAB — CBC
HCT: 39 % (ref 36.0–46.0)
Hemoglobin: 12.8 g/dL (ref 12.0–15.0)
MCH: 27 pg (ref 26.0–34.0)
MCHC: 32.8 g/dL (ref 30.0–36.0)
MCV: 82.3 fL (ref 80.0–100.0)
Platelets: 159 K/uL (ref 150–400)
RBC: 4.74 MIL/uL (ref 3.87–5.11)
RDW: 14 % (ref 11.5–15.5)
WBC: 7.9 K/uL (ref 4.0–10.5)
nRBC: 0 % (ref 0.0–0.2)

## 2023-11-23 LAB — GLUCOSE, CAPILLARY
Glucose-Capillary: 103 mg/dL — ABNORMAL HIGH (ref 70–99)
Glucose-Capillary: 104 mg/dL — ABNORMAL HIGH (ref 70–99)
Glucose-Capillary: 109 mg/dL — ABNORMAL HIGH (ref 70–99)
Glucose-Capillary: 115 mg/dL — ABNORMAL HIGH (ref 70–99)
Glucose-Capillary: 131 mg/dL — ABNORMAL HIGH (ref 70–99)

## 2023-11-23 MED ORDER — BUTALBITAL-APAP-CAFFEINE 50-325-40 MG PO TABS
1.0000 | ORAL_TABLET | Freq: Four times a day (QID) | ORAL | Status: DC | PRN
  Administered 2023-11-23: 1 via ORAL
  Administered 2023-11-24 – 2023-11-25 (×4): 2 via ORAL
  Administered 2023-11-26 – 2023-11-30 (×9): 1 via ORAL
  Administered 2023-11-30: 2 via ORAL
  Administered 2023-11-30: 1 via ORAL
  Administered 2023-11-30 – 2023-12-02 (×6): 2 via ORAL
  Administered 2023-12-02 (×2): 1 via ORAL
  Administered 2023-12-03 (×3): 2 via ORAL
  Administered 2023-12-04 (×2): 1 via ORAL
  Administered 2023-12-04 – 2023-12-07 (×4): 2 via ORAL
  Administered 2023-12-08 (×2): 1 via ORAL
  Filled 2023-11-23 (×2): qty 1
  Filled 2023-11-23: qty 2
  Filled 2023-11-23: qty 1
  Filled 2023-11-23 (×2): qty 2
  Filled 2023-11-23 (×2): qty 1
  Filled 2023-11-23: qty 2
  Filled 2023-11-23: qty 1
  Filled 2023-11-23 (×2): qty 2
  Filled 2023-11-23: qty 1
  Filled 2023-11-23: qty 2
  Filled 2023-11-23 (×3): qty 1
  Filled 2023-11-23 (×4): qty 2
  Filled 2023-11-23: qty 1
  Filled 2023-11-23 (×3): qty 2
  Filled 2023-11-23 (×2): qty 1
  Filled 2023-11-23: qty 2
  Filled 2023-11-23: qty 1
  Filled 2023-11-23: qty 2
  Filled 2023-11-23: qty 1
  Filled 2023-11-23: qty 2
  Filled 2023-11-23: qty 1
  Filled 2023-11-23 (×2): qty 2

## 2023-11-23 MED ORDER — OXYCODONE HCL 5 MG PO TABS
5.0000 mg | ORAL_TABLET | ORAL | Status: DC | PRN
  Administered 2023-11-23 – 2023-12-03 (×9): 5 mg via ORAL
  Filled 2023-11-23 (×11): qty 1

## 2023-11-23 MED ORDER — LEVETIRACETAM 750 MG PO TABS
750.0000 mg | ORAL_TABLET | Freq: Two times a day (BID) | ORAL | Status: AC
  Administered 2023-11-23 – 2023-11-26 (×7): 750 mg
  Filled 2023-11-23 (×7): qty 1

## 2023-11-23 MED ORDER — ACETAMINOPHEN-CODEINE 300-30 MG PO TABS: 2.0000 | ORAL_TABLET | ORAL | Status: DC | PRN

## 2023-11-23 MED ORDER — SODIUM CHLORIDE 0.9 % IV SOLN
12.5000 mg | Freq: Four times a day (QID) | INTRAVENOUS | Status: DC | PRN
  Administered 2023-11-23: 12.5 mg via INTRAVENOUS
  Filled 2023-11-23: qty 12.5
  Filled 2023-11-23: qty 0.5

## 2023-11-23 MED ORDER — ACETAMINOPHEN-CODEINE 300-30 MG PO TABS
2.0000 | ORAL_TABLET | Freq: Four times a day (QID) | ORAL | Status: DC
  Administered 2023-11-23 – 2023-11-29 (×20): 2
  Filled 2023-11-23 (×23): qty 2

## 2023-11-23 MED ORDER — TOPIRAMATE 25 MG PO TABS
25.0000 mg | ORAL_TABLET | Freq: Two times a day (BID) | ORAL | Status: DC
  Administered 2023-11-23 – 2023-11-29 (×13): 25 mg
  Filled 2023-11-23 (×13): qty 1

## 2023-11-23 MED ORDER — OXYCODONE HCL 5 MG PO TABS
5.0000 mg | ORAL_TABLET | Freq: Once | ORAL | Status: AC
  Administered 2023-11-23: 5 mg via ORAL
  Filled 2023-11-23: qty 1

## 2023-11-23 NOTE — Evaluation (Signed)
 Speech Language Pathology Evaluation Patient Details Name: Andrea Bridges MRN: [id number] DOB: [birth year] Today's Date: 11/23/2023 Time: 9144-9079 SLP Time Calculation (min) (ACUTE ONLY): 25 min  Problem List:  Patient Active Problem List   Diagnosis Date Noted   SAH (subarachnoid hemorrhage) (HCC) 11/20/2023   Pulmonary nodule - suspicious- 10/2023 10/21/2023   Elevated lipoprotein(a) 07/25/2023   Insulin  resistance 07/25/2023   Colon polyps 06/09/2023   Encounter for screening for malignant neoplasm of lung in current smoker with 20 pack year history or greater 09/08/2022   Vitamin B12 deficiency 09/08/2022   Vitamin D  deficiency 09/08/2022   Other specified anxiety disorders-Emotional Eating 09/08/2022   At risk for heart disease 09/08/2022   Generalized obesity 09/08/2022   Tobacco abuse 07/10/2019   Basal cell carcinoma of skin 02/07/2018   Past Medical History:  Past Medical History:  Diagnosis Date   Acute appendicitis with perforation and localized peritonitis, without abscess or gangrene 12/11/2019   Anxiety    Infertility, female    PCOS (polycystic ovarian syndrome)    Skin cancer    basal cell   SOBOE (shortness of breath on exertion) 09/08/2022   Past Surgical History:  Past Surgical History:  Procedure Laterality Date   APPENDECTOMY  2021   BREAST BIOPSY     IR ANGIO VERTEBRAL SEL VERTEBRAL UNI R MOD SED  11/22/2023   HPI:  Patient is a 63 y.o. female with PMH: PCOS, smoking/vaping, obsity, skin CA, anxiety. She presented to the hospital on 11/20/23 with acute onset headache. Imaging revealed large SAH. Ventricular drain placed by neurosurgery. She was intubated 7/13 and extubated 7/14 to supplemental oxygen via [place] and currently on RA.   Assessment / Plan / Recommendation Clinical Impression  Patient presents with cognitive-linguistic impairments impacting her orientation to time, cognitive processing, attention, awareness, initiation as well as pragmatic language  (monotone voice, flat affect, poor eye contact, limited responses to open ended questions). She was oriented to place, year, month but not day of week or date. She did correctly state that she has been hospitalized for the past three days. When responding to open-ended biographical questions, she would give very basic responses but would not elaborate unless prompted. (for example when asked of her hobbies she replied: crafts) During divergent naming task as well as when answering orientation questions, she exhibited delays in responses and at times required cue to redirect to topic. When drawing clock, she initially drew 12 in the correct spot but then proceeded to write numbers 1-13 all on the right side of the circle. She did recognize this as being incorrect but unable to correct. When SLP wrote the 12 and  6 in correct spots, she was able to correctly place the rest of the numbers. SLP will follow patient for cognitive-linguistic intervention and she will likely benefit from skilled SLP at next venue of care as well.    SLP Assessment  SLP Recommendation/Assessment: Patient needs continued Speech Language Pathology Services SLP Visit Diagnosis: Cognitive communication deficit (R41.841)     Assistance Recommended at Discharge  Intermittent Supervision/Assistance  Functional Status Assessment Patient has had a recent decline in their functional status and demonstrates the ability to make significant improvements in function in a reasonable and predictable amount of time.  Frequency and Duration min 2x/week  2 weeks      SLP Evaluation Cognition  Overall Cognitive Status: Impaired/Different from baseline Arousal/Alertness: Awake/alert Orientation Level: Oriented to person;Oriented to place;Disoriented to time;Oriented to situation Year: 2025 Month: July Day  of Week: Incorrect Attention: Sustained Sustained Attention: Impaired Sustained Attention Impairment: Verbal basic;Verbal  complex Memory: Impaired Memory Impairment: Storage deficit;Retrieval deficit Awareness: Impaired Awareness Impairment: Intellectual impairment;Emergent impairment Problem Solving: Impaired Problem Solving Impairment: Verbal complex;Functional complex Executive Function: Self Correcting Initiating: Impaired Initiating Impairment: Verbal basic;Verbal complex Self Correcting: Impaired Self Correcting Impairment: Functional complex;Functional basic;Verbal complex Safety/Judgment: Impaired Comments: patient requiring cues to refrain from scratching head at surgical site and from pulling on Cortrak feeding tube       Comprehension  Auditory Comprehension Overall Auditory Comprehension: Impaired Yes/No Questions: Within Functional Limits Commands: Within Functional Limits Conversation: Simple Interfering Components: Attention EffectiveTechniques: Extra processing time Visual Recognition/Discrimination Discrimination: Not tested Reading Comprehension Reading Status: Not tested    Expression Expression Primary Mode of Expression: Verbal Verbal Expression Overall Verbal Expression: Impaired Automatic Speech: Social Response Level of Generative/Spontaneous Verbalization: Phrase Naming: Impairment Confrontation: Within functional limits Convergent: Not tested Divergent: 50-74% accurate Pragmatics: Impairment Impairments: Abnormal affect;Eye contact;Monotone Interfering Components: Attention Effective Techniques: Open ended questions Non-Verbal Means of Communication: Not applicable Written Expression Dominant Hand: Right Written Expression: Not tested   Oral / Motor  Oral Motor/Sensory Function Overall Oral Motor/Sensory Function: Within functional limits Motor Speech Overall Motor Speech: Appears within functional limits for tasks assessed Respiration: Within functional limits Resonance: Within functional limits Articulation: Within functional limitis Intelligibility:  Intelligible Motor Planning: Within functional limits Motor Speech Errors: Not applicable            Andrea Bridges IVAR Blase, MA, CCC-SLP Speech Therapy

## 2023-11-23 NOTE — Evaluation (Signed)
 Physical Therapy Evaluation Patient Details Name: Andrea Bridges MRN: [id number] DOB: 15-Jul-1960 Today's Date: 11/23/2023  History of Present Illness  Pt is a 63 y.o. female who presented 11/20/23 with a headache. Upon arrival to ED, she became nauseated and unresponsive. 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 S/p R frontal ventricular catheter placement 7/13. ETT 7/13-7/14. Cortrak placed 7/14. S/p diagnostic cerebral angiogram 7/15, but prematurely aborted due to pt agitation. PMH: anxiety, skin cancer   Clinical Impression  Pt presents with condition above and deficits mentioned below, see PT Problem List. PTA, she was independent without DME, working, and living with her daughter in a 1-level house with one step down, two steps up to enter her home. Currently, the pt is limited by headache pain, likely contributing to her slow and guarded gait pattern. She is currently demonstrating intact, symmetrical, and WFL bil lower extremity strength, sensation, and coordination in her legs. She was able to perform bed mobility, transfers, and ambulate without UE support and without LOB at a CGA level. She will likely progress well quickly with continued mobility, thus do not anticipate a need for post acute PT at d/c. Will continue to follow acutely to maximize her return to baseline.         If plan is discharge home, recommend the following: Assistance with cooking/housework;Direct supervision/assist for medications management;Direct supervision/assist for financial management;Assist for transportation;Help with stairs or ramp for entrance;Supervision due to cognitive status   Can travel by private vehicle        Equipment Recommendations None recommended by PT  Recommendations for Other Services       Functional Status Assessment Patient has had a recent decline in their functional status and demonstrates the ability to make significant improvements in function in a reasonable  and predictable amount of time.     Precautions / Restrictions Precautions Precautions: Fall;Other (comment) Precaution/Restrictions Comments: SBP < 160; EVD (clamp prior to mobility) Restrictions Weight Bearing Restrictions Per Provider Order: No      Mobility  Bed Mobility Overal bed mobility: Needs Assistance Bed Mobility: Supine to Sit     Supine to sit: Contact guard, HOB elevated, +2 for safety/equipment     General bed mobility comments: Pt required extra time to transition supine to sit R EOB, HOB elevated, CGA for safety, +2 for line management    Transfers Overall transfer level: Needs assistance Equipment used: None Transfers: Sit to/from Stand, Bed to chair/wheelchair/BSC Sit to Stand: Contact guard assist, +2 safety/equipment   Step pivot transfers: Contact guard assist, +2 safety/equipment       General transfer comment: Extra time to power up to stand, CGA for safety standing from EOB 1x and from bedside commode 1x, no LOB. Step pivot to L from bed to commode without LOB or UE support, CGA for safety + 2 for line management.    Ambulation/Gait Ambulation/Gait assistance: Contact guard assist, +2 safety/equipment Gait Distance (Feet): 150 Feet (x2 bouts of ~4 ft > ~150 ft) Assistive device: None Gait Pattern/deviations: Step-through pattern, Decreased step length - right, Decreased step length - left, Decreased stride length Gait velocity: reduced Gait velocity interpretation: <1.8 ft/sec, indicate of risk for recurrent falls   General Gait Details: Pt ambulates slowly with a guarded gait pattern, but no LOB. Pt able to turn head L <> R with mild slowing of her gait and mild trunk sway, but no LOB. Able to change speeds and stop suddenly without LOB also. CGA for safety, +  2 for line management.  Stairs            Wheelchair Mobility     Tilt Bed    Modified Rankin (Stroke Patients Only) Modified Rankin (Stroke Patients Only) Pre-Morbid  Rankin Score: No symptoms Modified Rankin: Moderately severe disability     Balance Overall balance assessment: Mild deficits observed, not formally tested                                           Pertinent Vitals/Pain Pain Assessment Pain Assessment: 0-10 Pain Score: 9  Pain Location: HA Pain Descriptors / Indicators: Headache Pain Intervention(s): Limited activity within patient's tolerance, Monitored during session, Premedicated before session, Repositioned    Home Living Family/patient expects to be discharged to:: Private residence Living Arrangements: Children Available Help at Discharge: Family;Friend(s);Available PRN/intermittently Type of Home: House Home Access: Stairs to enter Entrance Stairs-Rails: None Entrance Stairs-Number of Steps: one step down, two steps up   Home Layout: One level Home Equipment: None      Prior Function Prior Level of Function : Independent/Modified Independent;Working/employed;Driving             Mobility Comments: indep, no AD ADLs Comments: indep, works full time     Extremity/Trunk Assessment   Upper Extremity Assessment Upper Extremity Assessment: Defer to OT evaluation    Lower Extremity Assessment Lower Extremity Assessment: Overall WFL for tasks assessed (MMT scores of 4+ to 5 grossly bil; coordination intact bil; sensation intact bil)    Cervical / Trunk Assessment Cervical / Trunk Assessment: Normal  Communication   Communication Communication: No apparent difficulties    Cognition Arousal: Alert Behavior During Therapy: Flat affect   PT - Cognitive impairments: Attention, Problem solving                       PT - Cognition Comments: Delayed processing noted. Following commands: Impaired Following commands impaired: Follows multi-step commands with increased time     Cueing Cueing Techniques: Verbal cues     General Comments General comments (skin integrity, edema,  etc.): VSS on RA    Exercises     Assessment/Plan    PT Assessment Patient needs continued PT services  PT Problem List Decreased activity tolerance;Decreased balance;Decreased mobility;Decreased cognition;Pain       PT Treatment Interventions DME instruction;Gait training;Stair training;Functional mobility training;Therapeutic activities;Therapeutic exercise;Balance training;Neuromuscular re-education;Cognitive remediation;Patient/family education    PT Goals (Current goals can be found in the Care Plan section)  Acute Rehab PT Goals Patient Stated Goal: to improve PT Goal Formulation: With patient/family Time For Goal Achievement: 12/07/23 Potential to Achieve Goals: Good    Frequency Min 1X/week     Co-evaluation   Reason for Co-Treatment: Necessary to address cognition/behavior during functional activity;For patient/therapist safety;To address functional/ADL transfers PT goals addressed during session: Balance;Mobility/safety with mobility OT goals addressed during session: ADL's and self-care       AM-PAC PT 6 Clicks Mobility  Outcome Measure Help needed turning from your back to your side while in a flat bed without using bedrails?: A Little Help needed moving from lying on your back to sitting on the side of a flat bed without using bedrails?: A Little Help needed moving to and from a bed to a chair (including a wheelchair)?: A Little Help needed standing up from a chair using your arms (e.g., wheelchair or bedside  chair)?: A Little Help needed to walk in hospital room?: A Little Help needed climbing 3-5 steps with a railing? : A Little 6 Click Score: 18    End of Session Equipment Utilized During Treatment: Gait belt Activity Tolerance: Patient tolerated treatment well Patient left: with call bell/phone within reach;with nursing/sitter in room;with family/visitor present;Other (comment) (on commode) Nurse Communication: Mobility status;Other (comment) (pt on  commode end of session, RN reported she would monitor and assist pt back to bed after) PT Visit Diagnosis: Unsteadiness on feet (R26.81);Other abnormalities of gait and mobility (R26.89);Pain Pain - Right/Left:  (headache) Pain - part of body:  (headache)    Time: 8578-8555 PT Time Calculation (min) (ACUTE ONLY): 23 min   Charges:   PT Evaluation $PT Eval Moderate Complexity: 1 Mod   PT General Charges $$ ACUTE PT VISIT: 1 Visit         Theo Ferretti, PT, DPT Acute Rehabilitation Services  Office: [phone]   Theo CHRISTELLA Ferretti 11/23/2023, 3:14 PM

## 2023-11-23 NOTE — Progress Notes (Signed)
 PT Cancellation Note  Patient Details Name: Kinga Cassar MRN: [id number] DOB: 05-23-1960   Cancelled Treatment:    Reason Eval/Treat Not Completed: Active bedrest order with neuro drain in place. Coordinated with RN to confirm orders with neuro team. Will plan to follow-up as able.   Theo Ferretti, PT, DPT Acute Rehabilitation Services  Office: [phone]    Theo CHRISTELLA Ferretti 11/23/2023, 8:00 AM

## 2023-11-23 NOTE — Progress Notes (Signed)
 NAME:  Andrea Bridges, MRN:  [id number], DOB:  [birth year], LOS: 3 ADMISSION DATE:  11/20/2023, CONSULTATION DATE:  11/20/23 REFERRING MD:  EDP, CHIEF COMPLAINT:  headache   History of Present Illness:  63 year old woman w/ hx of smoking, obesity presenting with headache.  Workup reveals large subarachnoid hemorrhage.  CTA neg but V4 of both vertebrals and distal basilar artery taper off concerning for possible early vasospasm and making full eval of this area difficult.  NSGY coming in to consider drain.  Intermittent purposeful movement and occasionally says ow to nursing.  PCCM to admit.  Significant Hospital Events: Including procedures, antibiotic start and stop dates in addition to other pertinent events   11/20/23 admit, EVD placement 7/13 CT head>>  1. Diffuse basilar subarachnoid hemorrhage with intraventricular extension and early ventriculomegaly, greatest along the left cerebellopontine angle cistern, possibly indicating the site of origin. 7/14 EEG>>This study is suggestive of moderate to severe diffuse encephalopathy, nonspecific etiology but likely related to sedation. No seizures or epileptiform discharges were seen throughout the recording. Extubated. 7/15 limited angiogram- no aneurysms, AVMs, or fistulas. CVC& aline removed.   Interim History / Subjective:  Headache ongoing, pain meds have not helped much.  Objective    Blood pressure (!) 111/57, pulse (!) 59, temperature 98.4 F (36.9 C), temperature source Axillary, resp. rate 14, height 5' 7 (1.702 m), weight 85.9 kg, SpO2 93%.        Intake/Output Summary (Last 24 hours) at 11/23/2023 0729 Last data filed at 11/23/2023 0700 Gross per 24 hour  Intake 950.7 ml  Output 1460 ml  Net -509.3 ml   Filed Weights   11/20/23 1703 11/22/23 0500 11/23/23 0500  Weight: 90 kg 87.2 kg 85.9 kg    Examination: General: elderly woman lying in bed in NAD HENT: EVD dressing in place, eyes anicteric.  Lungs: CTAB, breathing  comfortably on [place] Cardiovascular: S1S2, RRR Abdomen: soft, NT Extremities: no significant peripheral edema Neuro: Mild facial weakness on the left.  Symmetric full strength.  BUN 16 Cr 0.71 WBC 7.9 H/H 12.8/39 Platelets 159   Assessment and Plan   Nontraumatic SAH with hydrocephalus s/p EVD 7/13 -appreciate neurosurgery's management; planning for repeat angiography in ~5 days -con't EVD per NS> continues to require ICU care -NS has wanted to con't bedrest; can revisit today.  -keppra  -TCD -nimotop  q4h -goal SBP <160  Headache -previously did not want oxycodone  -trial of topamax  BID per Neuro's recommendation -con't tylenol  #3  Hyperglycemia -SSI PRN -goal BG 140-180  Dysphagia -SLP consulted -con't cortrak with TF until able to meet her needs by eating  Electrolyte abnormalities - resolved -monitor  H/o tobaco abuse -recommend cessation -con't nicotine  patch -recommend referral for lung cancer screening at discharge  Best Practice (right click and Reselect all SmartList Selections daily)   Diet/type: tubefeeds DVT prophylaxis lovenox  Pressure ulcer(s): N/A GI prophylaxis: PPI Lines: N/A Foley:  removal ordered  Code Status:  full code Last date of multidisciplinary goals of care discussion [daughter/exhusband updated]  Labs   CBC: Recent Labs  Lab 11/20/23 1630 11/20/23 1635 11/21/23 0121 11/21/23 0232 11/22/23 0641 11/23/23 0456  WBC 14.7*  --  12.5*  --  8.9 7.9  NEUTROABS 12.1*  --   --   --   --   --   HGB 12.3 12.2 12.7 12.2 12.5 12.8  HCT 37.6 36.0 37.8 36.0 37.3 39.0  MCV 83.4  --  80.9  --  81.4 82.3  PLT 196  --  188  --  164 159    Basic Metabolic Panel: Recent Labs  Lab 11/20/23 1630 11/20/23 1635 11/21/23 0121 11/21/23 0232 11/21/23 1854 11/22/23 0642 11/23/23 0456 11/23/23 0457  NA 136 138 135 138  --  139  --  144  K 3.2* 3.3* 4.0 3.8  --  3.4*  --  4.0  CL 107 105 104  --   --  107  --  105  CO2 19*  --  21*  --    --  23  --  26  GLUCOSE 179* 172* 114*  --   --  136*  --  124*  BUN 24* 24* 14  --   --  13  --  16  CREATININE 0.81 0.70 0.58  --   --  0.66  --  0.71  CALCIUM 8.2*  --  8.7*  --   --  9.2  --  9.5  MG  --   --   --   --  2.1 2.2 2.2  --   PHOS  --   --  3.0  --  2.3* 1.8*  --  2.9   GFR: Estimated Creatinine Clearance: 81 mL/min (by C-G formula based on SCr of 0.71 mg/dL). Recent Labs  Lab 11/20/23 1630 11/21/23 0121 11/22/23 0641 11/23/23 0456  WBC 14.7* 12.5* 8.9 7.9    Liver Function Tests: Recent Labs  Lab 11/20/23 1630 11/21/23 0121 11/22/23 0642 11/23/23 0457  AST 29  --   --   --   ALT 20  --   --   --   ALKPHOS 53  --   --   --   BILITOT 0.8  --   --   --   PROT 5.8*  --   --   --   ALBUMIN 3.3* 3.7 3.4* 3.4*   Critical care time:      Leita SHAUNNA Gaskins, DO 11/23/23 10:38 AM [facility]  For contact information, see Amion. If no response to pager, please call PCCM consult pager. After hours, 7PM- 7AM, please call Elink.

## 2023-11-23 NOTE — Evaluation (Signed)
 Occupational Therapy Evaluation Patient Details Name: Andrea Bridges MRN: [id number] DOB: Apr 16, 1961 Today's Date: 11/23/2023   History of Present Illness   Pt is a 63 y.o. female who presented 11/20/23 with a headache. Upon arrival to ED, she became nauseated and unresponsive. 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 S/p R frontal ventricular catheter placement 7/13. ETT 7/13-7/14. Cortrak placed 7/14. S/p diagnostic cerebral angiogram 7/15, but prematurely aborted due to pt agitation. PMH: anxiety, skin cancer     Clinical Impressions EVD clamped by RN prior to session, RN in room to unclamp at the end of the session.  Falon was evaluated s/p the above admission list. She is indep and lives alone at baseline, her daughter is home from college and can assist as needed. Upon evaluation the pt was limited by 9/10 headache, stiffness, sore shoulders, unsteady balance, poor activity tolerance and limited executive functioning (plan to further assess higher level cog). Overall she needed CGA for mobility at a slow pace due to her EVD and HA pain. Due to the deficits listed below the pt also needs up to CGA for ADLs with cues for safety. Pt will benefit from continued acute OT services and discharge home without OT follow up.       If plan is discharge home, recommend the following:   A little help with bathing/dressing/bathroom;Assistance with cooking/housework;Assist for transportation;Help with stairs or ramp for entrance     Functional Status Assessment   Patient has had a recent decline in their functional status and demonstrates the ability to make significant improvements in function in a reasonable and predictable amount of time.     Equipment Recommendations   None recommended by OT      Precautions/Restrictions   Precautions Precautions: Fall;Other (comment) Precaution/Restrictions Comments: SBP < 160; EVD (clamp prior to mobility) Restrictions Weight  Bearing Restrictions Per Provider Order: No     Mobility Bed Mobility Overal bed mobility: Needs Assistance Bed Mobility: Supine to Sit     Supine to sit: Contact guard, HOB elevated, +2 for safety/equipment          Transfers Overall transfer level: Needs assistance Equipment used: None Transfers: Sit to/from Stand, Bed to chair/wheelchair/BSC Sit to Stand: Contact guard assist, +2 safety/equipment     Step pivot transfers: Contact guard assist, +2 safety/equipment     General transfer comment: progressed to CGA for [place] mobility      Balance Overall balance assessment: Mild deficits observed, not formally tested                                         ADL either performed or assessed with clinical judgement   ADL Overall ADL's : Needs assistance/impaired Eating/Feeding: Independent   Grooming: Standing;Supervision/safety   Upper Body Bathing: Set up;Sitting   Lower Body Bathing: Contact guard assist;Sit to/from stand   Upper Body Dressing : Set up;Sitting   Lower Body Dressing: Contact guard assist;Sit to/from stand Lower Body Dressing Details (indicate cue type and reason): pt able to don socks at the EOB Toilet Transfer: Contact guard assist;Ambulation;Regular Archivist- Clothing Manipulation and Hygiene: Supervision/safety;Sitting/lateral lean       Functional mobility during ADLs: Supervision/safety General ADL Comments: limited by HA     Vision Baseline Vision/History: 1 Wears glasses Vision Assessment?: Vision impaired- to be further tested in functional context Additional Comments: vision screen [facility].  trancking and confrontation testing was [facility].     Perception Perception: Not tested       Praxis Praxis: Not tested       Pertinent Vitals/Pain Pain Assessment Pain Assessment: 0-10 Pain Score: 9  Pain Location: HA Pain Descriptors / Indicators: Headache Pain Intervention(s): Limited activity within  patient's tolerance, Monitored during session     Extremity/Trunk Assessment Upper Extremity Assessment Upper Extremity Assessment: Overall WFL for tasks assessed;Right hand dominant   Lower Extremity Assessment Lower Extremity Assessment: Overall WFL for tasks assessed (MMT scores of 4+ to 5 grossly bil; coordination intact bil; sensation intact bil)   Cervical / Trunk Assessment Cervical / Trunk Assessment: Normal   Communication Communication Communication: No apparent difficulties   Cognition Arousal: Alert Behavior During Therapy: Flat affect Cognition: Cognition impaired             OT - Cognition Comments: Cognition not formally assessed. pt followed all simple commands and initiated and sequenced through ADLs appropriately. Plan to assess higher level cog next session                 Following commands: Impaired Following commands impaired: Follows multi-step commands with increased time     Cueing  General Comments   Cueing Techniques: Verbal cues  VSS on RA   Exercises     Shoulder Instructions      Home Living Family/patient expects to be discharged to:: Private residence Living Arrangements: Children Available Help at Discharge: Family;Friend(s);Available PRN/intermittently Type of Home: House Home Access: Stairs to enter [employer] of Steps: one step down, two steps up Entrance Stairs-Rails: None Home Layout: One level     Bathroom Shower/Tub: Walk-in shower;Tub/shower unit;Door   [employer] Toilet: Standard     Home Equipment: None          Prior Functioning/Environment Prior Level of Function : Independent/Modified Independent;Working/employed;Driving             Mobility Comments: indep, no AD ADLs Comments: indep, works full time    OT Problem List: Decreased strength;Decreased range of motion;Decreased activity tolerance;Impaired balance (sitting and/or standing);Decreased safety awareness;Decreased  knowledge of use of DME or AE;Decreased knowledge of precautions   OT Treatment/Interventions: Self-care/ADL training;Therapeutic exercise;DME and/or AE instruction;Therapeutic activities;Cognitive remediation/compensation;Patient/family education;Balance training      OT Goals(Current goals can be found in the care plan section)   Acute Rehab OT Goals Patient Stated Goal: to feel better OT Goal Formulation: With patient Time For Goal Achievement: 12/07/23 Potential to Achieve Goals: Good ADL Goals Additional ADL Goal #1: pt will complete all ADLs with mod I Additional ADL Goal #2: Pt will indep complete 3 step trail making task to demonstrate improved cognition for higher level IADLs   OT Frequency:  Min 2X/week    Co-evaluation PT/OT/SLP Co-Evaluation/Treatment: Yes Reason for Co-Treatment: Necessary to address cognition/behavior during functional activity;For patient/therapist safety;To address functional/ADL transfers PT goals addressed during session: Balance;Mobility/safety with mobility OT goals addressed during session: ADL's and self-care      AM-PAC OT 6 Clicks Daily Activity     Outcome Measure Help from another person eating meals?: None Help from another person taking care of personal grooming?: A Little Help from another person toileting, which includes using toliet, bedpan, or urinal?: A Little Help from another person bathing (including washing, rinsing, drying)?: A Little Help from another person to put on and taking off regular upper body clothing?: A Little Help from another person to put on and taking off regular lower  body clothing?: A Little 6 Click Score: 19   End of Session Equipment Utilized During Treatment: Gait belt Nurse Communication: Mobility status  Activity Tolerance: Patient tolerated treatment well Patient left:  (in bathroom with RN)  OT Visit Diagnosis: Unsteadiness on feet (R26.81);Other abnormalities of gait and mobility  (R26.89);Muscle weakness (generalized) (M62.81)                Time: 8582-8553 OT Time Calculation (min): 29 min Charges:  OT General Charges $OT Visit: 1 Visit OT Evaluation $OT Eval Moderate Complexity: 1 Mod  Lucie Kendall, OTR/L Acute Rehabilitation Services Office [phone] Secure Chat Communication Preferred   [name] 11/23/2023, 3:21 PM

## 2023-11-23 NOTE — Evaluation (Signed)
 Clinical/Bedside Swallow Evaluation Patient Details  Name: Andrea Bridges MRN: [id number] Date of Birth: [birth year]  Today's Date: 11/23/2023 Time: SLP Start Time (ACUTE ONLY): 9157 SLP Stop Time (ACUTE ONLY): 0855 SLP Time Calculation (min) (ACUTE ONLY): 13 min  Past Medical History:  Past Medical History:  Diagnosis Date   Acute appendicitis with perforation and localized peritonitis, without abscess or gangrene 12/11/2019   Anxiety    Infertility, female    PCOS (polycystic ovarian syndrome)    Skin cancer    basal cell   SOBOE (shortness of breath on exertion) 09/08/2022   Past Surgical History:  Past Surgical History:  Procedure Laterality Date   APPENDECTOMY  2021   BREAST BIOPSY     IR ANGIO VERTEBRAL SEL VERTEBRAL UNI R MOD SED  11/22/2023   HPI:  Patient is a 63 y.o. female with PMH: PCOS, smoking/vaping, obsity, skin CA, anxiety. She presented to the hospital on 11/20/23 with acute onset headache. Imaging revealed large SAH. Ventricular drain placed by neurosurgery. She was intubated 7/13 and extubated 7/14 to supplemental oxygen via  and currently on RA. She passed [facility] swallow screen with RN but SLP swallow evaluation ordered to ensure medical readiness for PO's.    Assessment / Plan / Recommendation  Clinical Impression  Patient not currently presenting with clinical s/s of dysphagia as per this bedside swallow evaluation. She has no h/o dysphagia as per chart review. Voice is strong and clear and vitals WNL. SLP assessed her swallow as she fed herself thin liquids (water) via straw sips and solids (graham crackers). Mastication was WNL,  swallow initiation was timely and no overt s/s aspiration observed. SLP recommending regular solids, thin liquids and no SLP f/u for dysphagia. SLP Visit Diagnosis: Dysphagia, unspecified (R13.10)    Aspiration Risk  No limitations    Diet Recommendation Regular;Thin liquid    Liquid Administration via: Cup;Straw Medication  Administration: Whole meds with liquid Supervision: Patient able to self feed Compensations: Slow rate;Small sips/bites Postural Changes: Seated upright at 90 degrees    Other  Recommendations Oral Care Recommendations: Oral care BID;Patient independent with oral care     Assistance Recommended at Discharge    Functional Status Assessment Patient has not had a recent decline in their functional status  Frequency and Duration     N/A       Prognosis   N/A     Swallow Study   General Date of Onset: 11/22/23 HPI: Patient is a 63 y.o. female with PMH: PCOS, smoking/vaping, obsity, skin CA, anxiety. She presented to the hospital on 11/20/23 with acute onset headache. Imaging revealed large SAH. Ventricular drain placed by neurosurgery. She was intubated 7/13 and extubated 7/14 to supplemental oxygen via  and currently on RA. She passed [facility] swallow screen with RN but SLP swallow evaluation ordered to ensure medical readiness for PO's. Type of Study: Bedside Swallow Evaluation Previous Swallow Assessment: none found Diet Prior to this Study: NPO Temperature Spikes Noted: No Respiratory Status: Room air History of Recent Intubation: Yes Total duration of intubation (days): 2 days Date extubated: 11/21/23 Behavior/Cognition: Alert;Cooperative;Pleasant mood Oral Cavity Assessment: Within Functional Limits Oral Care Completed by SLP: Yes Oral Cavity - Dentition: Adequate natural dentition Vision: Functional for self-feeding Self-Feeding Abilities: Able to feed self Patient Positioning: Upright in bed Baseline Vocal Quality: Normal Volitional Cough: Strong Volitional Swallow: Able to elicit    Oral/Motor/Sensory Function Overall Oral Motor/Sensory Function: Within functional limits   Ice Chips     Thin  Liquid Thin Liquid: Within functional limits Presentation: Straw;Self Fed    Nectar Thick     Honey Thick     Puree Puree: Not tested   Solid     Solid: Within functional  limits Presentation: Self Fed     Norleen IVAR Blase, MA, CCC-SLP Speech Therapy

## 2023-11-23 NOTE — Progress Notes (Signed)
 Vomited a small amount, no change in HA or BP compared to the rest of the day.  EVD drained 20 mL w the quick change in position.  Diet and PT/OT started today w increased oral intake of liquids and activity OOB. Dr. Gillie notified and on the way to bedside. No new orders at this time.

## 2023-11-23 NOTE — Progress Notes (Signed)
 STROKE TEAM PROGRESS NOTE    SIGNIFICANT HOSPITAL EVENTS  7/13: EMS called d/t acute headache CTH shows diffuse SAH with IV extension and ventriculomegaly  INTERIM HISTORY/SUBJECTIVE Family at bedside.  Opens eyes to voice, moves all extremities purposefully Pending routine EEG Cleviprex  gtt, BP goal 130-150 for 24h then <160.  OK from neurological standpoint to extubate.  Per neurosurgery, cerebral angiogram in 5 to 6 days. Ventricular catheter continues to drain well  OBJECTIVE Patient sitting up in bed.  She is still having headaches.  She is however not gotten multiple doses of Tylenol  3.  Neurological exam remains unchanged.  Vital signs stable.  Blood pressure adequately controlled. Ventriculostomy catheter is draining well .  CSF is still bloody. CBC    Component Value Date/Time   WBC 7.9 11/23/2023 0456   RBC 4.74 11/23/2023 0456   HGB 12.8 11/23/2023 0456   HGB 13.5 09/08/2022 0854   HCT 39.0 11/23/2023 0456   HCT 43.1 09/08/2022 0854   PLT 159 11/23/2023 0456   PLT 219 09/08/2022 0854   MCV 82.3 11/23/2023 0456   MCV 84 09/08/2022 0854   MCH 27.0 11/23/2023 0456   MCHC 32.8 11/23/2023 0456   RDW 14.0 11/23/2023 0456   RDW 14.7 09/08/2022 0854   LYMPHSABS 1.9 11/20/2023 1630   LYMPHSABS 1.7 09/08/2022 0854   MONOABS 0.5 11/20/2023 1630   EOSABS 0.0 11/20/2023 1630   EOSABS 0.1 09/08/2022 0854   BASOSABS 0.1 11/20/2023 1630   BASOSABS 0.0 09/08/2022 0854    BMET    Component Value Date/Time   NA 144 11/23/2023 0457   NA 139 03/17/2023 0936   K 4.0 11/23/2023 0457   CL 105 11/23/2023 0457   CO2 26 11/23/2023 0457   GLUCOSE 124 (H) 11/23/2023 0457   BUN 16 11/23/2023 0457   BUN 21 03/17/2023 0936   CREATININE 0.71 11/23/2023 0457   CALCIUM 9.5 11/23/2023 0457   EGFR 72 03/17/2023 0936   GFRNONAA >60 11/23/2023 0457    IMAGING past 24 hours No results found.   Vitals:   11/23/23 0800 11/23/23 0900 11/23/23 1000 11/23/23 1100  BP: 109/60 111/81  110/68 110/66  Pulse: 61 68 (!) 58 (!) 56  Resp: 17 12 14 14   Temp: 98.5 F (36.9 C)     TempSrc: Oral     SpO2: 92% 94% 92% 93%  Weight:      Height:         PHYSICAL EXAM General:  Alert, critically ill patient in no acute distress CV: Regular rate and rhythm on monitor Respiratory:  Intubated and mechanically ventilated.   NEURO:  Mental Status: sedated, opens eyes to voice.  Speech/Language: intubated. Follows commands.   Cranial Nerves:  PERRL, Blinks to threat bilaterally, EOMI, UTA facial symmetry due to ETT present, Spontaneous antigravity movement in all extremities, withdraws to pain in all extremities       ASSESSMENT/PLAN  Andrea Bridges is a 63 y.o. female with history of PCOS, smoking/vaping, obesity, skin CA, anxiety  who presented with acute onset of headache 7/13.  Imaging revealed large subarachnoid hemorrhage, ventricular drain placed by neurosurgery.  Admitted to critical care for continued medical management.  NIH on Admission: 27.  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  CTA head & neck  No definite aneurysm or vascular malformation is identified. Abrupt tapering of the V4 segment of both vertebral arteries and the distal basilar artery, concerning for early vasospasm, which could be masking an aneurysm.  11/22/2023 :Limited cerebral catheter angiogram right vertebral injection only no evidence of vasospasm, aneurysm or AVM TCD  (QMWF): PENDING LDL 151 HgbA1c 5.5 VTE prophylaxis - SCD No antithrombotic prior to admission, now on No antithrombotic  due to [facility] Continue nimodipine  Continue Keppra  Therapy recommendations:  Pending Disposition:  pending  Hypertension Home meds:  none Unstable Cleviprex  gtt, wean as able Blood Pressure Goal: SBP between 130-150 for 24 hours and then less than 160   Hyperlipidemia Home meds:  none LDL 151, goal < 70 Add Lipitor 80 mg on discharge  Continue statin at discharge  Tobacco Abuse Patient smokes or vapes daily      Ready to quit? N/A, revisit when able to participate.  Nicotine  replacement therapy provided  Substance Abuse UDS pending  Dysphagia Patient has post-stroke dysphagia, SLP consulted    Diet   Diet regular Room service appropriate? Yes; Fluid consistency: Thin   Advance diet as tolerated  Other Stroke Risk Factors Obesity, Body mass index is 29.66 kg/m., BMI >/= 30 associated with increased stroke risk, recommend weight loss, diet and exercise as appropriate    Hospital day # 3      Patient is still having headache.  Recommend Tylenol  #3  2 tablets every 6 hourly and start Topamax  25 mg twice daily.  Continue ventriculostomy drainage.  Continue strict blood pressure control systolic below 160.  Mobilize out of bed as tolerated.  Physical occupational speech therapy consults.  Will postpone diagnostic cerebral catheter angiogram till next week.  Continue nimodipine  and follow-up transcranial Dopplers.  Long discussion with patient, ex-husband and daughter at the bedside and answered questions.  Discussed with Dr. Gretta critical care medicine. This patient is critically ill and at significant risk of neurological worsening, death and care requires constant monitoring of vital signs, hemodynamics,respiratory and cardiac monitoring, extensive review of multiple databases, frequent neurological assessment, discussion with family, other specialists and medical decision making of high complexity.I have made any additions or clarifications directly to the above note.This  critical care time does not reflect procedure time, or teaching time or supervisory time of PA/NP/Med Resident etc but could involve care discussion time.  I spent 30 minutes of neurocritical care time  in the care of  this patient.         Eather Popp, MD Medical Director [facility] Pager: [phone] 11/23/2023 11:26 AM  To contact Stroke Continuity provider, please refer to [email or web address]. After hours, contact General Neurology

## 2023-11-23 NOTE — Progress Notes (Signed)
  NEUROSURGERY PROGRESS NOTE   No issues overnight. Pt without complaint this am  EXAM:  BP 109/60 (BP Location: Left Arm)   Pulse 61   Temp 98.4 F (36.9 C) (Axillary)   Resp 17   Ht 5' 7 (1.702 m)   Wt 85.9 kg   LMP  (LMP Unknown)   SpO2 92%   BMI 29.66 kg/m   Awake, alert, oriented x 1-2 Speech fluent, appropriate  CN grossly intact  Good strength throughout Right groin site soft EVD in place, patent  IMPRESSION:  63 y.o. female SAH d# 3, CTA negative, unable to complete catheter angiogram. Remains neurologically intact  PLAN: - Cont EVD drainage - Cont to monitor in ICU - Nimotop  60q4 - TCD monitoring - Cont PT/OT/SLP - Plan on repeat angio next week   Gerldine Maizes, MD [facility]

## 2023-11-23 NOTE — Progress Notes (Signed)
 Transcranial Doppler   Date POD PCO2 HCT BP   MCA ACA PCA OPHT SIPH VERT Basilar  7/14 GC         Right  Left   50  51          25  22   14  17   22   *   *  *   *       7/16 JH       39 117/65  Right  Left    60   62    -18   -40    37   39    19   14    20    43    -54   -67    -51                 Right  Left                                                                 Right  Left                                                                 Right  Left                                                               Right  Left                                                               Right  Left                                                       MCA = Middle Cerebral Artery      OPHT = Opthalmic Artery     BASILAR = Basilar Artery   ACA = Anterior Cerebral Artery     SIPH = Carotid Siphon PCA = Posterior Cerebral Artery   VERT = Verterbral Artery                    Normal MCA = 62+\-12 ACA = 50+\-12 PCA = 42+\-23    Right Lindegaard ratio : 1.67 Left Lindegaard ratio : 2.38  Results can be found under chart review under CV PROC. 11/23/2023 5:10 PM Catalaya Garr RVT, RDMS

## 2023-11-24 ENCOUNTER — Other Ambulatory Visit (HOSPITAL_COMMUNITY)

## 2023-11-24 DIAGNOSIS — R739 Hyperglycemia, unspecified: Secondary | ICD-10-CM | POA: Diagnosis not present

## 2023-11-24 DIAGNOSIS — E785 Hyperlipidemia, unspecified: Secondary | ICD-10-CM | POA: Diagnosis not present

## 2023-11-24 DIAGNOSIS — I69391 Dysphagia following cerebral infarction: Secondary | ICD-10-CM | POA: Diagnosis not present

## 2023-11-24 DIAGNOSIS — I615 Nontraumatic intracerebral hemorrhage, intraventricular: Secondary | ICD-10-CM | POA: Diagnosis not present

## 2023-11-24 DIAGNOSIS — R1310 Dysphagia, unspecified: Secondary | ICD-10-CM | POA: Diagnosis not present

## 2023-11-24 DIAGNOSIS — R519 Headache, unspecified: Secondary | ICD-10-CM | POA: Diagnosis not present

## 2023-11-24 DIAGNOSIS — I609 Nontraumatic subarachnoid hemorrhage, unspecified: Secondary | ICD-10-CM | POA: Diagnosis not present

## 2023-11-24 LAB — RENAL FUNCTION PANEL
Albumin: 3.3 g/dL — ABNORMAL LOW (ref 3.5–5.0)
Anion gap: 10 (ref 5–15)
BUN: 23 mg/dL (ref 8–23)
CO2: 25 mmol/L (ref 22–32)
Calcium: 9.4 mg/dL (ref 8.9–10.3)
Chloride: 102 mmol/L (ref 98–111)
Creatinine, Ser: 0.72 mg/dL (ref 0.44–1.00)
GFR, Estimated: >60 mL/min (ref >60)
Glucose, Bld: 109 mg/dL — ABNORMAL HIGH (ref 70–99)
Phosphorus: 3.4 mg/dL (ref 2.5–4.6)
Potassium: 4.7 mmol/L (ref 3.5–5.1)
Sodium: 137 mmol/L (ref 135–145)

## 2023-11-24 LAB — CBC
HCT: 38.4 % (ref 36.0–46.0)
Hemoglobin: 12.8 g/dL (ref 12.0–15.0)
MCH: 27.6 pg (ref 26.0–34.0)
MCHC: 33.3 g/dL (ref 30.0–36.0)
MCV: 82.9 fL (ref 80.0–100.0)
Platelets: 202 K/uL (ref 150–400)
RBC: 4.63 MIL/uL (ref 3.87–5.11)
RDW: 13.9 % (ref 11.5–15.5)
WBC: 9.1 K/uL (ref 4.0–10.5)
nRBC: 0 % (ref 0.0–0.2)

## 2023-11-24 LAB — GLUCOSE, CAPILLARY
Glucose-Capillary: 104 mg/dL — ABNORMAL HIGH (ref 70–99)
Glucose-Capillary: 118 mg/dL — ABNORMAL HIGH (ref 70–99)
Glucose-Capillary: 119 mg/dL — ABNORMAL HIGH (ref 70–99)
Glucose-Capillary: 130 mg/dL — ABNORMAL HIGH (ref 70–99)
Glucose-Capillary: 135 mg/dL — ABNORMAL HIGH (ref 70–99)
Glucose-Capillary: 160 mg/dL — ABNORMAL HIGH (ref 70–99)

## 2023-11-24 LAB — MAGNESIUM: Magnesium: 2.2 mg/dL (ref 1.7–2.4)

## 2023-11-24 LAB — PHOSPHORUS: Phosphorus: 3.3 mg/dL (ref 2.5–4.6)

## 2023-11-24 MED ORDER — TAMSULOSIN HCL 0.4 MG PO CAPS
0.4000 mg | ORAL_CAPSULE | Freq: Every day | ORAL | Status: DC
  Administered 2023-11-24 – 2023-11-29 (×6): 0.4 mg via ORAL
  Filled 2023-11-24 (×6): qty 1

## 2023-11-24 MED ORDER — ENSURE PLUS HIGH PROTEIN PO LIQD
237.0000 mL | Freq: Two times a day (BID) | ORAL | Status: DC
  Administered 2023-11-25 – 2023-12-07 (×9): 237 mL via ORAL

## 2023-11-24 MED ORDER — BETHANECHOL CHLORIDE 10 MG PO TABS
10.0000 mg | ORAL_TABLET | Freq: Three times a day (TID) | ORAL | Status: DC
  Administered 2023-11-24 – 2023-11-29 (×16): 10 mg via ORAL
  Filled 2023-11-24 (×16): qty 1

## 2023-11-24 MED ORDER — SENNA 8.6 MG PO TABS
1.0000 | ORAL_TABLET | Freq: Two times a day (BID) | ORAL | Status: DC
  Administered 2023-11-25 – 2023-11-28 (×6): 8.6 mg via ORAL
  Filled 2023-11-24 (×6): qty 1

## 2023-11-24 MED ORDER — POLYETHYLENE GLYCOL 3350 17 G PO PACK
17.0000 g | PACK | Freq: Every day | ORAL | Status: DC
  Administered 2023-11-25 – 2023-11-26 (×2): 17 g via ORAL
  Filled 2023-11-24: qty 1

## 2023-11-24 NOTE — Progress Notes (Signed)
 Physical Therapy Treatment Patient Details Name: Andrea Bridges MRN: [id number] DOB: 1960/09/20 Today's Date: 11/24/2023   History of Present Illness Pt is a 63 y.o. female who presented 11/20/23 with a headache. Upon arrival to ED, she became nauseated and unresponsive. 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 S/p R frontal ventricular catheter placement 7/13. ETT 7/13-7/14. Cortrak placed 7/14. S/p diagnostic cerebral angiogram 7/15, but prematurely aborted due to pt agitation. PMH: anxiety, skin cancer    PT Comments  Pt found in bed at the start of the session. Asked nurse to clamp the EVD prior to mobilizing. Pt reports she still has 9/10 headache pain but is agreeable to therapy. Pt needs min A for LE and trunk management for getting out of bed but is CGA when going from sitting to supine. Pt ambulated 200 ft with CGA and IV pole for balance but was limited due to her HA pain. Nurse was able to unclamp EVD after pt got back in bed. Pt remains a good candidate for physical therapy for pain management and activity tolerance.    If plan is discharge home, recommend the following: Assistance with cooking/housework;Direct supervision/assist for medications management;Direct supervision/assist for financial management;Assist for transportation;Help with stairs or ramp for entrance;Supervision due to cognitive status   Can travel by private vehicle        Equipment Recommendations  None recommended by PT    Recommendations for Other Services       Precautions / Restrictions Precautions Precautions: Fall;Other (comment) (EVD) Recall of Precautions/Restrictions: Intact Precaution/Restrictions Comments: SBP < 160; EVD (clamp prior to mobility) Restrictions Weight Bearing Restrictions Per Provider Order: No     Mobility  Bed Mobility Overal bed mobility: Needs Assistance Bed Mobility: Supine to Sit, Sit to Supine     Supine to sit: Min assist Sit to supine: Contact  guard assist   General bed mobility comments: Pt required extra time and to transition supine to sit R EOB. Assistance with LE management and UE to help pull up.    Transfers Overall transfer level: Needs assistance Equipment used: None Transfers: Sit to/from Stand Sit to Stand: Contact guard assist           General transfer comment: Needed extra time to power up for sit to stand. STSx2    Ambulation/Gait Ambulation/Gait assistance: Contact guard assist Gait Distance (Feet): 200 Feet Assistive device: IV Pole Gait Pattern/deviations: Step-through pattern, Decreased stride length Gait velocity: reduced     General Gait Details: Pt needed repositioning with the IV pole to correct trunk rotation. CGA throughout ambulation and no LOB.   Stairs             Wheelchair Mobility     Tilt Bed    Modified Rankin (Stroke Patients Only) Modified Rankin (Stroke Patients Only) Pre-Morbid Rankin Score: No symptoms Modified Rankin: Moderately severe disability     Balance Overall balance assessment: Needs assistance Sitting-balance support: Feet supported, Bilateral upper extremity supported Sitting balance-Leahy Scale: Good       Standing balance-Leahy Scale: Fair Standing balance comment: Wide base of support and slight trunk flexion in stance                            Communication Communication Communication: No apparent difficulties  Cognition Arousal: Alert Behavior During Therapy: Flat affect   PT - Cognitive impairments: No apparent impairments  Cueing Cueing Techniques: Verbal cues  Exercises      General Comments General comments (skin integrity, edema, etc.): VSS on RA      Pertinent Vitals/Pain Pain Assessment Pain Assessment: 0-10 Pain Score: 9  Pain Location: HA Pain Descriptors / Indicators: Headache Pain Intervention(s): Limited activity within patient's tolerance, Monitored  during session    Home Living                          Prior Function            PT Goals (current goals can now be found in the care plan section) Acute Rehab PT Goals Patient Stated Goal: to improve PT Goal Formulation: With patient/family Time For Goal Achievement: 12/07/23 Potential to Achieve Goals: Good Progress towards PT goals: Progressing toward goals    Frequency    Min 1X/week      PT Plan      Co-evaluation              AM-PAC PT 6 Clicks Mobility   Outcome Measure  Help needed turning from your back to your side while in a flat bed without using bedrails?: A Little Help needed moving from lying on your back to sitting on the side of a flat bed without using bedrails?: A Little Help needed moving to and from a bed to a chair (including a wheelchair)?: A Little Help needed standing up from a chair using your arms (e.g., wheelchair or bedside chair)?: A Little Help needed to walk in hospital room?: A Little Help needed climbing 3-5 steps with a railing? : A Little 6 Click Score: 18    End of Session Equipment Utilized During Treatment: Gait belt Activity Tolerance: Patient tolerated treatment well Patient left: in bed;with call bell/phone within reach;with bed alarm set;with family/visitor present Nurse Communication: Mobility status (Asked nurse to clamp/unclamp the EVD) PT Visit Diagnosis: Unsteadiness on feet (R26.81);Other abnormalities of gait and mobility (R26.89);Pain     Time: 9149-9077 PT Time Calculation (min) (ACUTE ONLY): 32 min  Charges:    $Gait Training: 8-22 mins PT General Charges $$ ACUTE PT VISIT: 1 Visit                     Quintin Campi, SPT  Acute Rehab  [phone]    Quintin Campi 11/24/2023, 10:17 AM

## 2023-11-24 NOTE — Progress Notes (Signed)
  NEUROSURGERY PROGRESS NOTE   No issues overnight x some N/V, improved now. Improved HA today.  EXAM:  BP 108/60 (BP Location: Right Arm)   Pulse 72   Temp 99.3 F (37.4 C) (Axillary)   Resp 18   Ht 5' 7 (1.702 m)   Wt 85.9 kg   LMP  (LMP Unknown)   SpO2 95%   BMI 29.66 kg/m   Awake, alert, oriented x 2 Speech fluent CN grossly intact  Good strength throughout Right groin site soft EVD in place, patent  TCD: Date POD PCO2 HCT BP   MCA ACA PCA OPHT SIPH VERT Basilar  7/14 GC         Right  Left   50  51          25  22   14  17   22   *   *  *   *       7/16 JH       39 117/65  Right  Left    60   62    -18   -40    37   39    19   14    20    43    -54   -67    -51        IMPRESSION:  63 y.o. female SAH d# 4, CTA negative, unable to complete catheter angiogram. Remains neurologically intact, no clinical or sonographic spasm.  PLAN: - Cont EVD drainage - Cont to monitor in ICU - Nimotop  60q4 - TCD monitoring tomorrow - Cont PT/OT/SLP - Plan on repeat angio next week   Gerldine Maizes, MD [facility]

## 2023-11-24 NOTE — Progress Notes (Signed)
 NAME:  Andrea Bridges, MRN:  [id number], DOB:  Nov 22, 1960, LOS: 4 ADMISSION DATE:  11/20/2023, CONSULTATION DATE:  11/20/23 REFERRING MD:  EDP, CHIEF COMPLAINT:  headache   History of Present Illness:  63 year old woman w/ hx of smoking, obesity presenting with headache.  Workup reveals large subarachnoid hemorrhage.  CTA neg but V4 of both vertebrals and distal basilar artery taper off concerning for possible early vasospasm and making full eval of this area difficult.  NSGY coming in to consider drain.  Intermittent purposeful movement and occasionally says ow to nursing.  PCCM to admit.  Significant Hospital Events: Including procedures, antibiotic start and stop dates in addition to other pertinent events   11/20/23 admit, EVD placement 7/13 CT head>>  1. Diffuse basilar subarachnoid hemorrhage with intraventricular extension and early ventriculomegaly, greatest along the left cerebellopontine angle cistern, possibly indicating the site of origin. 7/14 EEG>>This study is suggestive of moderate to severe diffuse encephalopathy, nonspecific etiology but likely related to sedation. No seizures or epileptiform discharges were seen throughout the recording. Extubated. 7/15 limited angiogram- no aneurysms, AVMs, or fistulas. CVC& aline removed.   Interim History / Subjective:  She continues to have a headache. Oxycodone  helped some.   Objective    Blood pressure 122/75, pulse 71, temperature 99.3 F (37.4 C), temperature source Axillary, resp. rate 12, height 5' 7 (1.702 m), weight 85.9 kg, SpO2 94%.        Intake/Output Summary (Last 24 hours) at 11/24/2023 9287 Last data filed at 11/24/2023 0700 Gross per 24 hour  Intake 922.5 ml  Output 945 ml  Net -22.5 ml   Filed Weights   11/22/23 0500 11/23/23 0500 11/24/23 0500  Weight: 87.2 kg 85.9 kg 85.9 kg    Examination: General: ill appearing woman lying in bed in a dark room in NAD HENT: [place]/AT, eyes anicteric Lungs: breathing  comfortably on RA, CTAB Cardiovascular:  S1S2, RRR Abdomen: soft, NT Extremities: no significant peripheral edema, no cyanosis Neuro:  intact strength, moving all extremities easily  BUN 23 Cr 0.72 WBC 9.1 H/H 12.8/38.4 Platelets 202    Assessment and Plan   Nontraumatic SAH with hydrocephalus s/p EVD 7/13 -appreciate neurosurgery's management; planning for repeat angiography next week -con't EVD -pain control-- schedule Tylenol  #3, 2 tabs q6h. -con't topamax  25mg  BID -oxycodone  5mg  PRN for severe pain -con't keppra  -TCDs & nimodipine  -goal SBP <160  Headache -tylenol  with codeine , topamax  scheduled; PRN oxycodone   Hyperglycemia -SSI PRN -goal BG 140-180  Dysphagia, poor PO intake -SLP consulted -con't cortrak with TF until eating enough  Electrolyte abnormalities - resolved -monitor  H/o tobaco abuse -recommend cessation -con't nicotine  patch -recommend referral for lung cancer screening at discharge  Best Practice (right click and Reselect all SmartList Selections daily)   Diet/type: tubefeeds DVT prophylaxis lovenox  Pressure ulcer(s): N/A GI prophylaxis: PPI Lines: N/A Foley:  removal ordered  Code Status:  full code Last date of multidisciplinary goals of care discussion [daughter/exhusband updated]  Labs   CBC: Recent Labs  Lab 11/20/23 1630 11/20/23 1635 11/21/23 0121 11/21/23 0232 11/22/23 0641 11/23/23 0456  WBC 14.7*  --  12.5*  --  8.9 7.9  NEUTROABS 12.1*  --   --   --   --   --   HGB 12.3 12.2 12.7 12.2 12.5 12.8  HCT 37.6 36.0 37.8 36.0 37.3 39.0  MCV 83.4  --  80.9  --  81.4 82.3  PLT 196  --  188  --  164  159    Basic Metabolic Panel: Recent Labs  Lab 11/20/23 1630 11/20/23 1635 11/21/23 0121 11/21/23 0232 11/21/23 1854 11/22/23 0642 11/23/23 0456 11/23/23 0457  NA 136 138 135 138  --  139  --  144  K 3.2* 3.3* 4.0 3.8  --  3.4*  --  4.0  CL 107 105 104  --   --  107  --  105  CO2 19*  --  21*  --   --  23  --   26  GLUCOSE 179* 172* 114*  --   --  136*  --  124*  BUN 24* 24* 14  --   --  13  --  16  CREATININE 0.81 0.70 0.58  --   --  0.66  --  0.71  CALCIUM 8.2*  --  8.7*  --   --  9.2  --  9.5  MG  --   --   --   --  2.1 2.2 2.2  --   PHOS  --   --  3.0  --  2.3* 1.8*  --  2.9   GFR: Estimated Creatinine Clearance: 81 mL/min (by C-G formula based on SCr of 0.71 mg/dL). Recent Labs  Lab 11/20/23 1630 11/21/23 0121 11/22/23 0641 11/23/23 0456  WBC 14.7* 12.5* 8.9 7.9    Liver Function Tests: Recent Labs  Lab 11/20/23 1630 11/21/23 0121 11/22/23 0642 11/23/23 0457  AST 29  --   --   --   ALT 20  --   --   --   ALKPHOS 53  --   --   --   BILITOT 0.8  --   --   --   PROT 5.8*  --   --   --   ALBUMIN 3.3* 3.7 3.4* 3.4*   Critical care time:      Andrea SHAUNNA Gaskins, DO 11/24/23 9:04 AM [facility]  For contact information, see Amion. If no response to pager, please call PCCM consult pager. After hours, 7PM- 7AM, please call Elink.

## 2023-11-24 NOTE — Progress Notes (Signed)
 STROKE TEAM PROGRESS NOTE    SIGNIFICANT HOSPITAL EVENTS  7/13: EMS called d/t acute headache CTH shows diffuse SAH with IV extension and ventriculomegaly  INTERIM HISTORY/SUBJECTIVE Family at bedside.  Opens eyes to voice, moves all extremities purposefully Pending routine EEG Cleviprex  gtt, BP goal 130-150 for 24h then <160.  OK from neurological standpoint to extubate.  Per neurosurgery, cerebral angiogram in 5 to 6 days. Ventricular catheter continues to drain well  OBJECTIVE Patient sitting up in bed.  She is still having headaches.  She has not been switched to oxycodone ..  Neurological exam remains unchanged.  Vital signs stable.  Blood pressure adequately controlled. Ventriculostomy catheter is draining well .  CSF is still bloody.  Transcranial Doppler study showed no evidence of vasospasm CBC    Component Value Date/Time   WBC 9.1 11/24/2023 0545   RBC 4.63 11/24/2023 0545   HGB 12.8 11/24/2023 0545   HGB 13.5 09/08/2022 0854   HCT 38.4 11/24/2023 0545   HCT 43.1 09/08/2022 0854   PLT 202 11/24/2023 0545   PLT 219 09/08/2022 0854   MCV 82.9 11/24/2023 0545   MCV 84 09/08/2022 0854   MCH 27.6 11/24/2023 0545   MCHC 33.3 11/24/2023 0545   RDW 13.9 11/24/2023 0545   RDW 14.7 09/08/2022 0854   LYMPHSABS 1.9 11/20/2023 1630   LYMPHSABS 1.7 09/08/2022 0854   MONOABS 0.5 11/20/2023 1630   EOSABS 0.0 11/20/2023 1630   EOSABS 0.1 09/08/2022 0854   BASOSABS 0.1 11/20/2023 1630   BASOSABS 0.0 09/08/2022 0854    BMET    Component Value Date/Time   NA 137 11/24/2023 0546   NA 139 03/17/2023 0936   K 4.7 11/24/2023 0546   CL 102 11/24/2023 0546   CO2 25 11/24/2023 0546   GLUCOSE 109 (H) 11/24/2023 0546   BUN 23 11/24/2023 0546   BUN 21 03/17/2023 0936   CREATININE 0.72 11/24/2023 0546   CALCIUM 9.4 11/24/2023 0546   EGFR 72 03/17/2023 0936   GFRNONAA >60 11/24/2023 0546    IMAGING past 24 hours VAS US  TRANSCRANIAL DOPPLER Result Date: 11/23/2023   Transcranial Doppler Patient Name:  Andrea Bridges  Date of Exam:   11/23/2023 Medical Rec #: [id number]   Accession #:    [id number] Date of Birth: Aug 05, 1960   Patient Gender: F Patient Age:   9 years Exam Location:  [facility] Procedure:      VAS US  TRANSCRANIAL DOPPLER Referring Phys: TORIBIO SHARPS --------------------------------------------------------------------------------  Indications: Subarachnoid hemorrhage. History: Right frontal ventricular catheter placement (11/20/2023). Comparison Study: Previous exam 11/21/2023 Performing Technologist: Ezzie Potters RVT, RDMS  Examination Guidelines: A complete evaluation includes B-mode imaging, spectral Doppler, color Doppler, and power Doppler as needed of all accessible portions of each vessel. Bilateral testing is considered an integral part of a complete examination. Limited examinations for reoccurring indications may be performed as noted.  +----------+---------------+----------+-----------+-------+ RIGHT TCD Right VM (cm/s)Depth (cm)PulsatilityComment +----------+---------------+----------+-----------+-------+ MCA             60                    0.92            +----------+---------------+----------+-----------+-------+ ACA             -18                   1.32            +----------+---------------+----------+-----------+-------+ Term ICA  44                    1.20            +----------+---------------+----------+-----------+-------+ PCA P1          37                    0.89            +----------+---------------+----------+-----------+-------+ Opthalmic       19                    1.85            +----------+---------------+----------+-----------+-------+ ICA siphon      20                    0.64            +----------+---------------+----------+-----------+-------+ Vertebral       -54                   0.96            +----------+---------------+----------+-----------+-------+ Distal ICA      36                     1.12            +----------+---------------+----------+-----------+-------+  +----------+--------------+----------+-----------+-------+ LEFT TCD  Left VM (cm/s)Depth (cm)PulsatilityComment +----------+--------------+----------+-----------+-------+ MCA             62                   1.05            +----------+--------------+----------+-----------+-------+ ACA            -40                   1.00            +----------+--------------+----------+-----------+-------+ Term ICA        43                   0.93            +----------+--------------+----------+-----------+-------+ PCA P1          39                   0.88            +----------+--------------+----------+-----------+-------+ Opthalmic       14                   1.91            +----------+--------------+----------+-----------+-------+ ICA siphon      43                   0.93            +----------+--------------+----------+-----------+-------+ Vertebral      -67                   0.90            +----------+--------------+----------+-----------+-------+ Distal ICA      26                   1.26            +----------+--------------+----------+-----------+-------+  +------------+-------+-------+             VM cm/sComment +------------+-------+-------+ Prox Basilar  -43   0.84   +------------+-------+-------+  Dist Basilar  -51   0.83   +------------+-------+-------+ +----------------------+----+ Right Lindegaard Ratio1.67 +----------------------+----+ +---------------------+----+ Left Lindegaard Ratio2.38 +---------------------+----+  Summary: This was a normal transcranial Doppler study, with normal flow direction and velocity of all identified vessels of the anterior and posterior circulations, with no evidence of stenosis, vasospasm or occlusion. There was no evidence of intracranial disease. *See table(s) above for TCD measurements and observations.     Preliminary      Vitals:   11/24/23 1100 11/24/23 1200 11/24/23 1300 11/24/23 1400  BP: 109/66 108/60 115/64 119/72  Pulse: 74 72 62 62  Resp: 18 18 13 13   Temp:      TempSrc:      SpO2: 98% 95% 94% 94%  Weight:      Height:         PHYSICAL EXAM General:  Alert, pleasant middle-age Caucasian lady in no acute distress.  She has ventriculostomy catheter on the right CV: Regular rate and rhythm on monitor Respiratory:  Intubated and mechanically ventilated.   NEURO:  Mental Status: sedated, opens eyes to voice.  Speech/Language: intubated. Follows commands.   Cranial Nerves:  PERRL, Blinks to threat bilaterally, EOMI, UTA facial symmetry due to ETT present, Spontaneous antigravity movement in all extremities, withdraws to pain in all extremities       ASSESSMENT/PLAN  Ms. Andrea Bridges is a 63 y.o. female with history of PCOS, smoking/vaping, obesity, skin CA, anxiety  who presented with acute onset of headache 7/13.  Imaging revealed large subarachnoid hemorrhage, ventricular drain placed by neurosurgery.  Admitted to critical care for continued medical management.  NIH on Admission: 27.  Diffuse basilar subarachnoid hemorrhage with intraventricular extension Etiology: Possibly aneurysm negative however no aneurysm identified on CTA, and on limited right vertebral artery injection on catheter angiogram Code Stroke CT head  Diffuse basilar subarachnoid hemorrhage with intraventricular extension and early ventriculomegaly, greatest along the left cerebellopontine angle cistern, possibly indicating the site of origin.  CTA head & neck  No definite aneurysm or vascular malformation is identified. Abrupt tapering of the V4 segment of both vertebral arteries and the distal basilar artery, concerning for early vasospasm, which could be masking an aneurysm.  11/22/2023 :Limited cerebral catheter angiogram right vertebral injection only no evidence of vasospasm, aneurysm or AVM TCD   (QMWF): PENDING LDL 151 HgbA1c 5.5 VTE prophylaxis - SCD No antithrombotic prior to admission, now on No antithrombotic due to [facility] Continue nimodipine  Continue Keppra  Therapy recommendations:  Pending Disposition:  pending  Hypertension Home meds:  none Unstable Cleviprex  gtt, wean as able Blood Pressure Goal: SBP between 130-150 for 24 hours and then less than 160   Hyperlipidemia Home meds:  none LDL 151, goal < 70 Add Lipitor 80 mg on discharge  Continue statin at discharge  Tobacco Abuse Patient smokes or vapes daily      Ready to quit? N/A, revisit when able to participate.  Nicotine  replacement therapy provided  Substance Abuse UDS pending  Dysphagia Patient has post-stroke dysphagia, SLP consulted    Diet   Diet regular Room service appropriate? Yes; Fluid consistency: Thin   Advance diet as tolerated  Other Stroke Risk Factors Obesity, Body mass index is 29.66 kg/m., BMI >/= 30 associated with increased stroke risk, recommend weight loss, diet and exercise as appropriate    Hospital day # 4     Patient is still having headache.  Continue oxycodone  2 tablets every 6 hourly as needed and  Topamax  25 mg twice daily.  Continue ventriculostomy drainage.  Continue strict blood pressure control systolic below 160.  Mobilize out of bed as tolerated.  Physical occupational speech therapy consults.  Will postpone diagnostic cerebral catheter angiogram till next week.  Continue nimodipine  and follow-up transcranial Dopplers.  Long discussion with patient, ex-husband and daughter at the bedside and answered questions.    Discussed with Dr. Gretta.This patient is critically ill and at significant risk of neurological worsening, death and care requires constant monitoring of vital signs, hemodynamics,respiratory and cardiac monitoring, extensive review of multiple databases, frequent neurological assessment, discussion with family, other specialists and medical decision making  of high complexity.I have made any additions or clarifications directly to the above note.This critical care time does not reflect procedure time, or teaching time or supervisory time of PA/NP/Med Resident etc but could involve care discussion time.  I spent 30 minutes of neurocritical care time  in the care of  this patient.         Eather Popp, MD Medical Director [facility] Pager: [phone] 11/24/2023 3:52 PM  To contact Stroke Continuity provider, please refer to [email or web address]. After hours, contact General Neurology

## 2023-11-24 NOTE — Progress Notes (Addendum)
 Nutrition Follow-up  DOCUMENTATION CODES:  Not applicable  INTERVENTION:  Tube feeds via Cortrak: Osmolite 1.5 @ 55 mL/hr (1320 mL per day) 60 mL ProSource TF20 - Daily Regimen provides 2060 calories, 103 gm protein, and 1006 mL free water daily.   As intake improves will adjust enteral nutrition   Ensure Plus High Protein po BID, each supplement provides 350 kcal and 20 grams of protein.- pt prefers chocolate   NUTRITION DIAGNOSIS:  Inadequate oral intake related to inability to eat as evidenced by NPO status. Ongoing.   GOAL:  Patient will meet greater than or equal to 90% of their needs Met with TF at goal and diet advancement  MONITOR:  Vent status, Labs, Weight trends, I & O's  REASON FOR ASSESSMENT:  Ventilator   ASSESSMENT:  63 y.o. female presented to the ED with headache. PMH includes anxiety and PCOS. Pt admitted with SAH.  Pt discussed during ICU rounds and with RN and MD.  Pt seen with daughter at bedside. Pt not eating due to appetite and headache.  Plan to continue current TF regimen and as pt symptoms improve expect intake to improve and will adjust tube feeding.  Pt familiar with ensure and willing to try supplement.   7/13 - Admitted; Op s/p R frontal ventricular catheter placement 7/14 - Extubated; Cortrak placed 7/16 - diet advanced to Regular   Medications reviewed and include: colace, SSI every 4 hours, nimotop , protonix , miralax , senokot    Labs reviewed:  CBG's: 104-131  ICP: 345 ml   Diet Order:   Diet Order             Diet regular Room service appropriate? Yes; Fluid consistency: Thin  Diet effective now                  EDUCATION NEEDS: Not appropriate for education at this time  Skin:  Skin Assessment: Reviewed RN Assessment  Last BM:  7/16 medium  Height:  Ht Readings from Last 1 Encounters:  11/21/23 5' 7 (1.702 m)   Weight:  Wt Readings from Last 1 Encounters:  11/24/23 85.9 kg   Ideal Body Weight:  61.4  kg  BMI:  Body mass index is 29.66 kg/m.  Estimated Nutritional Needs:  Kcal:  1900-2100 Protein:  95-115 grams Fluid:  >/= 1.9 L   Amato Sevillano P., RD, LDN, CNSC See AMiON for contact information

## 2023-11-24 NOTE — Progress Notes (Signed)
 SLP Cancellation Note  Patient Details Name: Oneika Simonian MRN: [id number] DOB: [birth year]   Cancelled treatment:       Reason Eval/Treat Not Completed: Other (comment) (Patient lethargic and minimally interactive early this afternoon with daughter assisting with lunch. SLP attempted again and patient asleep. SLP will continue to follow.)  Norleen IVAR Blase, MA, CCC-SLP Speech Therapy

## 2023-11-25 ENCOUNTER — Inpatient Hospital Stay (HOSPITAL_COMMUNITY)

## 2023-11-25 DIAGNOSIS — G441 Vascular headache, not elsewhere classified: Secondary | ICD-10-CM | POA: Diagnosis not present

## 2023-11-25 DIAGNOSIS — I69391 Dysphagia following cerebral infarction: Secondary | ICD-10-CM | POA: Diagnosis not present

## 2023-11-25 DIAGNOSIS — E785 Hyperlipidemia, unspecified: Secondary | ICD-10-CM | POA: Diagnosis not present

## 2023-11-25 DIAGNOSIS — I609 Nontraumatic subarachnoid hemorrhage, unspecified: Secondary | ICD-10-CM

## 2023-11-25 DIAGNOSIS — R1310 Dysphagia, unspecified: Secondary | ICD-10-CM | POA: Diagnosis not present

## 2023-11-25 DIAGNOSIS — R739 Hyperglycemia, unspecified: Secondary | ICD-10-CM | POA: Diagnosis not present

## 2023-11-25 DIAGNOSIS — I615 Nontraumatic intracerebral hemorrhage, intraventricular: Secondary | ICD-10-CM | POA: Diagnosis not present

## 2023-11-25 DIAGNOSIS — R339 Retention of urine, unspecified: Secondary | ICD-10-CM

## 2023-11-25 LAB — GLUCOSE, CAPILLARY
Glucose-Capillary: 141 mg/dL — ABNORMAL HIGH (ref 70–99)
Glucose-Capillary: 136 mg/dL — ABNORMAL HIGH (ref 70–99)
Glucose-Capillary: 124 mg/dL — ABNORMAL HIGH (ref 70–99)
Glucose-Capillary: 156 mg/dL — ABNORMAL HIGH (ref 70–99)
Glucose-Capillary: 146 mg/dL — ABNORMAL HIGH (ref 70–99)

## 2023-11-25 LAB — RENAL FUNCTION PANEL
Albumin: 3.2 g/dL — ABNORMAL LOW (ref 3.5–5.0)
Anion gap: 11 (ref 5–15)
BUN: 27 mg/dL — ABNORMAL HIGH (ref 8–23)
CO2: 22 mmol/L (ref 22–32)
Calcium: 9.1 mg/dL (ref 8.9–10.3)
Chloride: 102 mmol/L (ref 98–111)
Creatinine, Ser: 0.79 mg/dL (ref 0.44–1.00)
GFR, Estimated: >60 mL/min (ref >60)
Glucose, Bld: 148 mg/dL — ABNORMAL HIGH (ref 70–99)
Phosphorus: 3.2 mg/dL (ref 2.5–4.6)
Potassium: 3.5 mmol/L (ref 3.5–5.1)
Sodium: 135 mmol/L (ref 135–145)

## 2023-11-25 LAB — CBC
HCT: 38.9 % (ref 36.0–46.0)
Hemoglobin: 13.1 g/dL (ref 12.0–15.0)
MCH: 27.3 pg (ref 26.0–34.0)
MCHC: 33.7 g/dL (ref 30.0–36.0)
MCV: 81 fL (ref 80.0–100.0)
Platelets: 202 K/uL (ref 150–400)
RBC: 4.8 MIL/uL (ref 3.87–5.11)
RDW: 13.4 % (ref 11.5–15.5)
WBC: 8.8 K/uL (ref 4.0–10.5)
nRBC: 0 % (ref 0.0–0.2)

## 2023-11-25 LAB — URINALYSIS, ROUTINE W REFLEX MICROSCOPIC
Bilirubin Urine: NEGATIVE
Glucose, UA: NEGATIVE mg/dL
Hgb urine dipstick: NEGATIVE
Ketones, ur: NEGATIVE mg/dL
Nitrite: NEGATIVE
Protein, ur: NEGATIVE mg/dL
Specific Gravity, Urine: 1.027 (ref 1.005–1.030)
pH: 5 (ref 5.0–8.0)

## 2023-11-25 MED ORDER — POTASSIUM CHLORIDE 20 MEQ PO PACK
40.0000 meq | PACK | Freq: Once | ORAL | Status: AC
  Administered 2023-11-25: 40 meq
  Filled 2023-11-25: qty 2

## 2023-11-25 NOTE — Progress Notes (Signed)
 Occupational Therapy Treatment Patient Details Name: Andrea Bridges MRN: [id number] DOB: [birth year] Today's Date: 11/25/2023   History of present illness Pt is a 63 y.o. female who presented 11/20/23 with a headache. Upon arrival to ED, she became nauseated and unresponsive. 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 S/p R frontal ventricular catheter placement 7/13. ETT 7/13-7/14. Cortrak placed 7/14. S/p diagnostic cerebral angiogram 7/15, but prematurely aborted due to pt agitation. PMH: anxiety, skin cancer   OT comments  Patient with increased lethargy and needing more assist for transfer and ADL completion.  Min to Mod A for bed mobility, Min A to stand and take slow short pivotal steps to the recliner.  Cues throughout to open eyes. Mod A for lower body ADL and Mod A to donn hospital gown.  RN aware of lethargy and declines in mobility and function.  OT will continue efforts in the acute setting to address deficits, post acute OT will need to be reassessed if decline to function persists.         If plan is discharge home, recommend the following:  A lot of help with walking and/or transfers;A lot of help with bathing/dressing/bathroom   Equipment Recommendations  None recommended by OT    Recommendations for Other Services      Precautions / Restrictions Precautions Precautions: Fall;Other (comment) Precaution/Restrictions Comments: SBP < 160; EVD (clamp prior to mobility) Restrictions Weight Bearing Restrictions Per Provider Order: No       Mobility Bed Mobility Overal bed mobility: Needs Assistance Bed Mobility: Supine to Sit     Supine to sit: Min assist, Mod assist          Transfers Overall transfer level: Needs assistance Equipment used: 1 person hand held assist Transfers: Sit to/from Stand, Bed to chair/wheelchair/BSC Sit to Stand: Min assist     Step pivot transfers: Min assist           Balance Overall balance assessment: Needs  assistance Sitting-balance support: Feet supported Sitting balance-Leahy Scale: Poor   Postural control: Posterior lean Standing balance support: Bilateral upper extremity supported Standing balance-Leahy Scale: Poor                               Extremity/Trunk Assessment Upper Extremity Assessment Upper Extremity Assessment: Overall WFL for tasks assessed   Lower Extremity Assessment Lower Extremity Assessment: Defer to PT evaluation   Cervical / Trunk Assessment Cervical / Trunk Assessment: Normal    Vision   Vision Assessment?: Vision impaired- to be further tested in functional context Additional Comments: patient opening eyes when cued, increased c/o HA   Perception Perception Perception: Not tested   Praxis Praxis Praxis: Not tested   Communication Communication Communication: No apparent difficulties   Cognition Arousal: Lethargic Behavior During Therapy: Flat affect Cognition: Difficult to assess Difficult to assess due to: Level of arousal           OT - Cognition Comments: is following one step commands and answering yes/no appropriately.                 Following commands: Impaired Following commands impaired: Follows multi-step commands with increased time      Cueing   Cueing Techniques: Verbal cues  Exercises      Shoulder Instructions       General Comments      Pertinent Vitals/ Pain       Pain Assessment Pain Assessment:  Faces Faces Pain Scale: Hurts even more Pain Location: HA Pain Descriptors / Indicators: Headache Pain Intervention(s): Monitored during session                                                          Frequency  Min 2X/week        Progress Toward Goals  OT Goals(current goals can now be found in the care plan section)  Progress towards OT goals: Not progressing toward goals - comment (Increased HA and lethargy)  Acute Rehab OT Goals OT Goal Formulation:  Patient unable to participate in goal setting Time For Goal Achievement: 12/07/23 Potential to Achieve Goals: Fair  Plan      Co-evaluation                 AM-PAC OT 6 Clicks Daily Activity     Outcome Measure   Help from another person eating meals?: A Little Help from another person taking care of personal grooming?: A Lot Help from another person toileting, which includes using toliet, bedpan, or urinal?: A Lot Help from another person bathing (including washing, rinsing, drying)?: A Lot Help from another person to put on and taking off regular upper body clothing?: A Lot Help from another person to put on and taking off regular lower body clothing?: A Lot 6 Click Score: 13    End of Session    OT Visit Diagnosis: Unsteadiness on feet (R26.81);Other abnormalities of gait and mobility (R26.89);Muscle weakness (generalized) (M62.81)   Activity Tolerance Patient limited by lethargy   Patient Left in chair;with call bell/phone within reach;with family/visitor present   Nurse Communication Mobility status        Time: 1330-1346 OT Time Calculation (min): 16 min  Charges: OT General Charges $OT Visit: 1 Visit OT Treatments $Therapeutic Activity: 8-22 mins  11/25/2023  RP, OTR/L  Acute Rehabilitation Services  Office:  [phone]   Andrea Bridges 11/25/2023, 1:51 PM

## 2023-11-25 NOTE — Progress Notes (Signed)
 STROKE TEAM PROGRESS NOTE    SIGNIFICANT HOSPITAL EVENTS  7/13: EMS called d/t acute headache CTH shows diffuse SAH with IV extension and ventriculomegaly  INTERIM HISTORY/SUBJECTIVE Family at bedside.  Opens eyes to voice, moves all extremities purposefully Pending routine EEG Cleviprex  gtt, BP goal 130-150 for 24h then <160.  OK from neurological standpoint to extubate.  Per neurosurgery, cerebral angiogram in 5 to 6 days. Ventricular catheter continues to drain well  OBJECTIVE Patient sitting up in bed.  She is still having headaches but improving.  She has not been switched to  tylenol  # 3 and prn oxycodone ..  Neurological exam remains unchanged.  Vital signs stable.  Blood pressure adequately controlled. Ventriculostomy catheter is draining well .  CSF is still bloody.  Transcranial Doppler study showed no evidence of vasospasm CBC    Component Value Date/Time   WBC 8.8 11/25/2023 0421   RBC 4.80 11/25/2023 0421   HGB 13.1 11/25/2023 0421   HGB 13.5 09/08/2022 0854   HCT 38.9 11/25/2023 0421   HCT 43.1 09/08/2022 0854   PLT 202 11/25/2023 0421   PLT 219 09/08/2022 0854   MCV 81.0 11/25/2023 0421   MCV 84 09/08/2022 0854   MCH 27.3 11/25/2023 0421   MCHC 33.7 11/25/2023 0421   RDW 13.4 11/25/2023 0421   RDW 14.7 09/08/2022 0854   LYMPHSABS 1.9 11/20/2023 1630   LYMPHSABS 1.7 09/08/2022 0854   MONOABS 0.5 11/20/2023 1630   EOSABS 0.0 11/20/2023 1630   EOSABS 0.1 09/08/2022 0854   BASOSABS 0.1 11/20/2023 1630   BASOSABS 0.0 09/08/2022 0854    BMET    Component Value Date/Time   NA 135 11/25/2023 0421   NA 139 03/17/2023 0936   K 3.5 11/25/2023 0421   CL 102 11/25/2023 0421   CO2 22 11/25/2023 0421   GLUCOSE 148 (H) 11/25/2023 0421   BUN 27 (H) 11/25/2023 0421   BUN 21 03/17/2023 0936   CREATININE 0.79 11/25/2023 0421   CALCIUM 9.1 11/25/2023 0421   EGFR 72 03/17/2023 0936   GFRNONAA >60 11/25/2023 0421    IMAGING past 24 hours VAS US  TRANSCRANIAL  DOPPLER Result Date: 11/25/2023  Transcranial Doppler Patient Name:  Andrea Bridges  Date of Exam:   11/25/2023 Medical Rec #: [id number]   Accession #:    [id number] Date of Birth: 10-11-1960   Patient Gender: F Patient Age:   63 years Exam Location:  [facility] Procedure:      VAS US  TRANSCRANIAL DOPPLER Referring Phys: TORIBIO SHARPS --------------------------------------------------------------------------------  Indications: Subarachnoid hemorrhage. History: Right frontal ventricular catheter placement (11/20/2023). Comparison Study: Previous exam on 11/23/2023 Performing Technologist: Ezzie Potters RVT, RDMS  Examination Guidelines: A complete evaluation includes B-mode imaging, spectral Doppler, color Doppler, and power Doppler as needed of all accessible portions of each vessel. Bilateral testing is considered an integral part of a complete examination. Limited examinations for reoccurring indications may be performed as noted.  +----------+---------------+----------+-----------+--------+ RIGHT TCD Right VM (cm/s)Depth (cm)PulsatilityComment  +----------+---------------+----------+-----------+--------+ MCA             70          5.20      1.30    130 cm/s +----------+---------------+----------+-----------+--------+ ACA             -38                   1.14             +----------+---------------+----------+-----------+--------+ Term ICA  48                    1.31             +----------+---------------+----------+-----------+--------+ PCA P1          35                    1.33             +----------+---------------+----------+-----------+--------+ Opthalmic       15                    2.04             +----------+---------------+----------+-----------+--------+ ICA siphon      38                    1.14             +----------+---------------+----------+-----------+--------+ Vertebral       -33                   0.98              +----------+---------------+----------+-----------+--------+ Distal ICA      36                    1.22             +----------+---------------+----------+-----------+--------+  +----------+--------------+----------+-----------+-------+ LEFT TCD  Left VM (cm/s)Depth (cm)PulsatilityComment +----------+--------------+----------+-----------+-------+ MCA             63                   1.21            +----------+--------------+----------+-----------+-------+ ACA            -34                   1.17            +----------+--------------+----------+-----------+-------+ Term ICA        45                   1.42            +----------+--------------+----------+-----------+-------+ PCA P1          30                   1.22            +----------+--------------+----------+-----------+-------+ Opthalmic       14                   2.17            +----------+--------------+----------+-----------+-------+ ICA siphon      33                   1.11            +----------+--------------+----------+-----------+-------+ Vertebral      -34                   1.25            +----------+--------------+----------+-----------+-------+ Distal ICA      25                   1.14            +----------+--------------+----------+-----------+-------+  +------------+-------+-------+             VM cm/sComment +------------+-------+-------+ Prox Basilar  -  41   0.98   +------------+-------+-------+ Dist Basilar  -52   0.96   +------------+-------+-------+ +----------------------+----+ Right Lindegaard Ratio1.94 +----------------------+----+ +---------------------+----+ Left Lindegaard Ratio2.52 +---------------------+----+  Summary: This was a normal transcranial Doppler study, with normal flow direction and velocity of all identified vessels of the anterior and posterior circulations, with no evidence of stenosis, vasospasm or occlusion. There was no evidence of  intracranial disease.  *See table(s) above for TCD measurements and observations.  Diagnosing physician: Eather Popp MD Electronically signed by Eather Popp MD on 11/25/2023 at 1:18:57 PM.    Final      Vitals:   11/25/23 1148 11/25/23 1200 11/25/23 1300 11/25/23 1400  BP:  113/65 117/64   Pulse:  67 73 72  Resp:  12 13 12   Temp: 97.7 F (36.5 C)     TempSrc: Oral     SpO2:  96% 95% 94%  Weight:      Height:         PHYSICAL EXAM General:  Alert, pleasant middle-age Caucasian lady in no acute distress.  She has ventriculostomy catheter on the right CV: Regular rate and rhythm on monitor Respiratory:  Intubated and mechanically ventilated.   NEURO:  Mental Status: sedated, opens eyes to voice.  Speech/Language: intubated. Follows commands.   Cranial Nerves:  PERRL, Blinks to threat bilaterally, EOMI, UTA facial symmetry due to ETT present, Spontaneous antigravity movement in all extremities, withdraws to pain in all extremities       ASSESSMENT/PLAN  Ms. Dayle Sherpa is a 63 y.o. female with history of PCOS, smoking/vaping, obesity, skin CA, anxiety  who presented with acute onset of headache 7/13.  Imaging revealed large subarachnoid hemorrhage, ventricular drain placed by neurosurgery.  Admitted to critical care for continued medical management.  NIH on Admission: 27.  Diffuse basilar subarachnoid hemorrhage with intraventricular extension Etiology: Possibly aneurysm negative however no aneurysm identified on CTA, and on limited right vertebral artery injection on catheter angiogram Code Stroke CT head  Diffuse basilar subarachnoid hemorrhage with intraventricular extension and early ventriculomegaly, greatest along the left cerebellopontine angle cistern, possibly indicating the site of origin.  CTA head & neck  No definite aneurysm or vascular malformation is identified. Abrupt tapering of the V4 segment of both vertebral arteries and the distal basilar artery, concerning  for early vasospasm, which could be masking an aneurysm.  11/22/2023 :Limited cerebral catheter angiogram right vertebral injection only no evidence of vasospasm, aneurysm or AVM TCD  (QMWF): PENDING LDL 151 HgbA1c 5.5 VTE prophylaxis - SCD No antithrombotic prior to admission, now on No antithrombotic due to [facility] Continue nimodipine  Continue Keppra  Therapy recommendations:  Pending Disposition:  pending  Hypertension Home meds:  none Unstable Cleviprex  gtt, wean as able Blood Pressure Goal: SBP between 130-150 for 24 hours and then less than 160   Hyperlipidemia Home meds:  none LDL 151, goal < 70 Add Lipitor 80 mg on discharge  Continue statin at discharge  Tobacco Abuse Patient smokes or vapes daily      Ready to quit? N/A, revisit when able to participate.  Nicotine  replacement therapy provided  Substance Abuse UDS pending  Dysphagia Patient has post-stroke dysphagia, SLP consulted    Diet   Diet regular Room service appropriate? Yes; Fluid consistency: Thin   Advance diet as tolerated  Other Stroke Risk Factors Obesity, Body mass index is 29.66 kg/m., BMI >/= 30 associated with increased stroke risk, recommend weight loss, diet and exercise as appropriate    Hospital day #  5    Patient is still having headache but improving.  Continue oxycodone  2 tablets every 6 hourly as needed and  Topamax  25 mg twice daily.  Continue ventriculostomy drainage.  Continue strict blood pressure control systolic below 160.  Mobilize out of bed as tolerated.  Physical occupational speech therapy consults.  Will postpone diagnostic cerebral catheter angiogram till next week.  Continue nimodipine  and follow-up transcranial Dopplers.  Long discussion with patient,  at the bedside and answered questions.    Discussed with Dr. Gretta.    This patient is critically ill and at significant risk of neurological worsening, death and care requires constant monitoring of vital signs,  hemodynamics,respiratory and cardiac monitoring, extensive review of multiple databases, frequent neurological assessment, discussion with family, other specialists and medical decision making of high complexity.I have made any additions or clarifications directly to the above note.This critical care time does not reflect procedure time, or teaching time or supervisory time of PA/NP/Med Resident etc but could involve care discussion time.  I spent 30 minutes of neurocritical care time  in the care of  this patient.       Eather Popp, MD Medical Director [facility] Pager: [phone] 11/25/2023 2:34 PM  To contact Stroke Continuity provider, please refer to [email or web address]. After hours, contact General Neurology

## 2023-11-25 NOTE — Progress Notes (Signed)
 Increasing lethargy throughout the past few hours which Dr. Gillie was aware of, pt now somnolent, requiring repeated stimulation to respond.  Dr. Gillie notified and Stat CT WO contrast ordered.

## 2023-11-25 NOTE — Progress Notes (Signed)
 I was notified by nurse that patient had undergone CT head without contrast for increased somnolence.  This shows no new acute intracranial findings.  Per nursing, EVD is working apprpriately.  She will need to continue with EVD drainage, may start weaning early next week.  Repeat angio next week.

## 2023-11-25 NOTE — Progress Notes (Signed)
 Transcranial Doppler   Date POD PCO2 HCT BP   MCA ACA PCA OPHT SIPH VERT Basilar  7/14 GC         Right  Left   50  51          25  22   14  17   22   *   *  *   *       7/16 JH       39 117/65  Right  Left    60   62    -18   -40    37   39    19   14    20    43    -54   -67    -51       7/18 JH     38.9   113/65 Right  Left    70   63    -38   -34    35   30    15   14     38   33    -33   -34    -52                   Right  Left                                                                 Right  Left                                                               Right  Left                                                               Right  Left                                                       MCA = Middle Cerebral Artery      OPHT = Opthalmic Artery     BASILAR = Basilar Artery   ACA = Anterior Cerebral Artery     SIPH = Carotid Siphon PCA = Posterior Cerebral Artery   VERT = Verterbral Artery                    Normal MCA = 62+\-12 ACA = 50+\-12 PCA = 42+\-23    Right Lindegaard ratio : 1.94 Left Lindegaard ratio :  2.52  Results can be found under chart review under CV PROC. 11/25/2023 12:29 PM Zakariah Urwin RVT, RDMS

## 2023-11-25 NOTE — Progress Notes (Signed)
 [facility] ADULT ICU REPLACEMENT PROTOCOL   The patient does apply for the [facility] Adult ICU Electrolyte Replacment Protocol based on the criteria listed below:   1.Exclusion criteria: TCTS, ECMO, Dialysis, and Myasthenia Gravis patients 2. Is GFR >/= 30 ml/min? Yes.    Patient's GFR today is >60 3. Is SCr </= 2? Yes.   Patient's SCr is 0.79 mg/dL 4. Did SCr increase >/= 0.5 in 24 hours? No. 5.Pt's weight >40kg  Yes.   6. Abnormal electrolyte(s):   K 3.5  7. Electrolytes replaced per protocol 8.  Call MD STAT for K+ </= 2.5, Phos </= 1, or Mag </= 1 Physician:  A. Haze Blackbird R Yazlyn Wentzel 11/25/2023 5:54 AM

## 2023-11-25 NOTE — Progress Notes (Signed)
 NAME:  Andrea Bridges, MRN:  [id number], DOB:  [birth year], LOS: 5 ADMISSION DATE:  11/20/2023, CONSULTATION DATE:  11/20/23 REFERRING MD:  EDP, CHIEF COMPLAINT:  headache   History of Present Illness:  63 year old woman w/ hx of smoking, obesity presenting with headache.  Workup reveals large subarachnoid hemorrhage.  CTA neg but V4 of both vertebrals and distal basilar artery taper off concerning for possible early vasospasm and making full eval of this area difficult.  NSGY coming in to consider drain.  Intermittent purposeful movement and occasionally says ow to nursing.  PCCM to admit.  Significant Hospital Events: Including procedures, antibiotic start and stop dates in addition to other pertinent events   11/20/23 admit, EVD placement 7/13 CT head>>  1. Diffuse basilar subarachnoid hemorrhage with intraventricular extension and early ventriculomegaly, greatest along the left cerebellopontine angle cistern, possibly indicating the site of origin. 7/14 EEG>>This study is suggestive of moderate to severe diffuse encephalopathy, nonspecific etiology but likely related to sedation. No seizures or epileptiform discharges were seen throughout the recording. Extubated. 7/15 limited angiogram- no aneurysms, AVMs, or fistulas. CVC& aline removed.   Interim History / Subjective:  Headache is better controlled today, but still can get severe. Still needing bladder catheterization due to inability to urinate the past few days.   Objective    Blood pressure 109/65, pulse 61, temperature 97.8 F (36.6 C), temperature source Oral, resp. rate 12, height 5' 7 (1.702 m), weight 85.9 kg, SpO2 94%.        Intake/Output Summary (Last 24 hours) at 11/25/2023 0856 Last data filed at 11/25/2023 0800 Gross per 24 hour  Intake 1192.5 ml  Output 1639 ml  Net -446.5 ml   Filed Weights   11/23/23 0500 11/24/23 0500 11/25/23 0704  Weight: 85.9 kg 85.9 kg 85.9 kg    Examination: General:  ill appearing woman  lying in bed in NAD HENT: EVD with serosanguinous output, cortrak Lungs: breathing comfortably on RA, minimal scattered rhonchi. Cardiovascular:  S1S2, RRR Abdomen: soft, NT Extremities: no significant edema, no cyanosis Neuro: Awake, alert, moving extremities. Not talking much today.   BUN 27 Cr 0.79 WBC 8.8 H/H  13.1/28.9 Platelets 202    Assessment and Plan   Nontraumatic SAH with hydrocephalus s/p EVD 7/13 Headache -appreciate neurosurgery's management; planning for repeat angiography next week -con't EVD; currently at 15 cm H2O. To remain in over the weekend.  -cont pain control-- scheduled topamax  and tylenol  #3, PRN oxycodone  -keppra  -TCDs, nimotop  -goal SBP <160 -repeat angiogram next week  Hyperglycemia- controlled -SSI PRN -goal BG 140-180  Dysphagia, poor PO intake -Appreciate SLP -continue cortrak + TF; trying to drink an ensure today  Urinary retention -flomax , bethanechol  -send UA -replace foley  Electrolyte abnormalities - resolved -monitor  H/o tobaco abuse -recommend cessation -nicotine  patch -recommend referral for lung cancer screening at discharge  Best Practice (right click and Reselect all SmartList Selections daily)   Diet/type: tubefeeds DVT prophylaxis lovenox  Pressure ulcer(s): N/A GI prophylaxis: PPI Lines: N/A Foley:  removal ordered  Code Status:  full code Last date of multidisciplinary goals of care discussion [daughter/exhusband updated]  Labs   CBC: Recent Labs  Lab 11/20/23 1630 11/20/23 1635 11/21/23 0121 11/21/23 0232 11/22/23 0641 11/23/23 0456 11/24/23 0545 11/25/23 0421  WBC 14.7*  --  12.5*  --  8.9 7.9 9.1 8.8  NEUTROABS 12.1*  --   --   --   --   --   --   --  HGB 12.3   < > 12.7 12.2 12.5 12.8 12.8 13.1  HCT 37.6   < > 37.8 36.0 37.3 39.0 38.4 38.9  MCV 83.4  --  80.9  --  81.4 82.3 82.9 81.0  PLT 196  --  188  --  164 159 202 202   < > = values in this interval not displayed.    Basic  Metabolic Panel: Recent Labs  Lab 11/21/23 0121 11/21/23 0232 11/21/23 1854 11/22/23 0642 11/23/23 0456 11/23/23 0457 11/24/23 0545 11/24/23 0546 11/25/23 0421  NA 135 138  --  139  --  144  --  137 135  K 4.0 3.8  --  3.4*  --  4.0  --  4.7 3.5  CL 104  --   --  107  --  105  --  102 102  CO2 21*  --   --  23  --  26  --  25 22  GLUCOSE 114*  --   --  136*  --  124*  --  109* 148*  BUN 14  --   --  13  --  16  --  23 27*  CREATININE 0.58  --   --  0.66  --  0.71  --  0.72 0.79  CALCIUM 8.7*  --   --  9.2  --  9.5  --  9.4 9.1  MG  --   --  2.1 2.2 2.2  --  2.2  --   --   PHOS 3.0  --  2.3* 1.8*  --  2.9 3.3 3.4 3.2   GFR: Estimated Creatinine Clearance: 81 mL/min (by C-G formula based on SCr of 0.79 mg/dL). Recent Labs  Lab 11/22/23 0641 11/23/23 0456 11/24/23 0545 11/25/23 0421  WBC 8.9 7.9 9.1 8.8    Liver Function Tests: Recent Labs  Lab 11/20/23 1630 11/21/23 0121 11/22/23 0642 11/23/23 0457 11/24/23 0546 11/25/23 0421  AST 29  --   --   --   --   --   ALT 20  --   --   --   --   --   ALKPHOS 53  --   --   --   --   --   BILITOT 0.8  --   --   --   --   --   PROT 5.8*  --   --   --   --   --   ALBUMIN 3.3* 3.7 3.4* 3.4* 3.3* 3.2*   Critical care time:      Leita SHAUNNA Gaskins, DO 11/25/23 9:10 AM  Pulmonary & Critical Care  For contact information, see Amion. If no response to pager, please call PCCM consult pager. After hours, 7PM- 7AM, please call Elink.

## 2023-11-25 NOTE — Progress Notes (Signed)
  NEUROSURGERY PROGRESS NOTE   No issues overnight, mild HA today.  EXAM:  BP 109/65 (BP Location: Right Arm)   Pulse 61   Temp 97.8 F (36.6 C) (Oral)   Resp 12   Ht 5' 7 (1.702 m)   Wt 85.9 kg   LMP  (LMP Unknown)   SpO2 94%   BMI 29.66 kg/m   Awake, alert, oriented Speech fluent CN grossly intact  Good strength throughout Right groin site soft EVD in place, patent open at 15cmH2O. 263cc blood tinged CSF x 24hrs  TCD: Date POD PCO2 HCT BP   MCA ACA PCA OPHT SIPH VERT Basilar  7/14 GC         Right  Left   50  51          25  22   14  17   22   *   *  *   *       7/16 JH       39 117/65  Right  Left    60   62    -18   -40    37   39    19   14    20    43    -54   -67    -51        IMPRESSION:  63 y.o. female SAH d# 5, CTA negative, unable to complete catheter angiogram a few days ago. Remains neurologically intact, no clinical or sonographic spasm.  PLAN: - Cont EVD drainage, likely plan on wean early next week. - Cont to monitor in ICU - Nimotop  60q4 - TCD monitoring today - Cont PT/OT/SLP - Plan on repeat angio next week either Mon/Tues   Gerldine Maizes, MD [facility]

## 2023-11-26 ENCOUNTER — Inpatient Hospital Stay (HOSPITAL_COMMUNITY)

## 2023-11-26 DIAGNOSIS — R739 Hyperglycemia, unspecified: Secondary | ICD-10-CM | POA: Diagnosis not present

## 2023-11-26 DIAGNOSIS — G9340 Encephalopathy, unspecified: Secondary | ICD-10-CM | POA: Diagnosis not present

## 2023-11-26 DIAGNOSIS — R509 Fever, unspecified: Secondary | ICD-10-CM

## 2023-11-26 DIAGNOSIS — G441 Vascular headache, not elsewhere classified: Secondary | ICD-10-CM | POA: Diagnosis not present

## 2023-11-26 DIAGNOSIS — R4182 Altered mental status, unspecified: Secondary | ICD-10-CM | POA: Diagnosis not present

## 2023-11-26 DIAGNOSIS — K5903 Drug induced constipation: Secondary | ICD-10-CM

## 2023-11-26 DIAGNOSIS — I615 Nontraumatic intracerebral hemorrhage, intraventricular: Secondary | ICD-10-CM | POA: Diagnosis not present

## 2023-11-26 DIAGNOSIS — R569 Unspecified convulsions: Secondary | ICD-10-CM | POA: Diagnosis not present

## 2023-11-26 DIAGNOSIS — I69391 Dysphagia following cerebral infarction: Secondary | ICD-10-CM | POA: Diagnosis not present

## 2023-11-26 DIAGNOSIS — I609 Nontraumatic subarachnoid hemorrhage, unspecified: Secondary | ICD-10-CM | POA: Diagnosis not present

## 2023-11-26 LAB — RENAL FUNCTION PANEL
Albumin: 3.5 g/dL (ref 3.5–5.0)
Anion gap: 17 — ABNORMAL HIGH (ref 5–15)
BUN: 31 mg/dL — ABNORMAL HIGH (ref 8–23)
CO2: 22 mmol/L (ref 22–32)
Calcium: 9.4 mg/dL (ref 8.9–10.3)
Chloride: 101 mmol/L (ref 98–111)
Creatinine, Ser: 0.75 mg/dL (ref 0.44–1.00)
GFR, Estimated: >60 mL/min (ref >60)
Glucose, Bld: 147 mg/dL — ABNORMAL HIGH (ref 70–99)
Phosphorus: 2.4 mg/dL — ABNORMAL LOW (ref 2.5–4.6)
Potassium: 4.2 mmol/L (ref 3.5–5.1)
Sodium: 140 mmol/L (ref 135–145)

## 2023-11-26 LAB — GLUCOSE, CAPILLARY
Glucose-Capillary: 116 mg/dL — ABNORMAL HIGH (ref 70–99)
Glucose-Capillary: 118 mg/dL — ABNORMAL HIGH (ref 70–99)
Glucose-Capillary: 131 mg/dL — ABNORMAL HIGH (ref 70–99)
Glucose-Capillary: 144 mg/dL — ABNORMAL HIGH (ref 70–99)
Glucose-Capillary: 155 mg/dL — ABNORMAL HIGH (ref 70–99)
Glucose-Capillary: 157 mg/dL — ABNORMAL HIGH (ref 70–99)

## 2023-11-26 LAB — MAGNESIUM: Magnesium: 2.5 mg/dL — ABNORMAL HIGH (ref 1.7–2.4)

## 2023-11-26 MED ORDER — VANCOMYCIN HCL IN DEXTROSE 1-5 GM/200ML-% IV SOLN
1000.0000 mg | Freq: Two times a day (BID) | INTRAVENOUS | Status: DC
  Administered 2023-11-27 – 2023-11-28 (×3): 1000 mg via INTRAVENOUS
  Filled 2023-11-26 (×3): qty 200

## 2023-11-26 MED ORDER — SODIUM CHLORIDE 0.9 % IV SOLN
2.0000 g | Freq: Three times a day (TID) | INTRAVENOUS | Status: DC
  Administered 2023-11-26 – 2023-11-27 (×3): 2 g via INTRAVENOUS
  Filled 2023-11-26 (×3): qty 12.5

## 2023-11-26 MED ORDER — VANCOMYCIN HCL 2000 MG/400ML IV SOLN
2000.0000 mg | Freq: Once | INTRAVENOUS | Status: AC
  Administered 2023-11-26: 2000 mg via INTRAVENOUS
  Filled 2023-11-26 (×2): qty 400

## 2023-11-26 NOTE — Progress Notes (Signed)
 NAME:  Andrea Bridges, MRN:  [id number], DOB:  [birth year], LOS: 6 ADMISSION DATE:  11/20/2023, CONSULTATION DATE:  11/20/23 REFERRING MD:  EDP, CHIEF COMPLAINT:  headache   History of Present Illness:  63 year old woman w/ hx of smoking, obesity presenting with headache.  Workup reveals large subarachnoid hemorrhage.  CTA neg but V4 of both vertebrals and distal basilar artery taper off concerning for possible early vasospasm and making full eval of this area difficult.  NSGY coming in to consider drain.  Intermittent purposeful movement and occasionally says ow to nursing.  PCCM to admit.  Significant Hospital Events: Including procedures, antibiotic start and stop dates in addition to other pertinent events   11/20/23 admit, EVD placement 7/13 CT head>>  1. Diffuse basilar subarachnoid hemorrhage with intraventricular extension and early ventriculomegaly, greatest along the left cerebellopontine angle cistern, possibly indicating the site of origin. 7/14 EEG>>This study is suggestive of moderate to severe diffuse encephalopathy, nonspecific etiology but likely related to sedation. No seizures or epileptiform discharges were seen throughout the recording. Extubated. 7/15 limited angiogram- no aneurysms, AVMs, or fistulas. CVC& aline removed.   Interim History / Subjective:  Still has significant headache that is not well-controlled.  Last dose of oxycodone  midday 7/18.  Lethargic today. Tmax 100.6.  Objective    Blood pressure 136/71, pulse 78, temperature 99.6 F (37.6 C), temperature source Axillary, resp. rate 17, height 5' 7 (1.702 m), weight 84.7 kg, SpO2 96%.        Intake/Output Summary (Last 24 hours) at 11/26/2023 1452 Last data filed at 11/26/2023 1400 Gross per 24 hour  Intake 1320 ml  Output 2175 ml  Net -855 ml   Filed Weights   11/24/23 0500 11/25/23 0704 11/26/23 0600  Weight: 85.9 kg 85.9 kg 84.7 kg    Examination: General:  ill appearing woman lying in bed  sleeping HENT: EVD with serosanguinous output  Lungs: breathing comfortably on RA, no tachypnea Cardiovascular:  S1S2, RRR Abdomen: soft, NT Extremities: no edema Neuro: awake, weaker globally compared to prior exams, but still following commands x 4 extremities  BUN 27 Cr 0.79 WBC 8.8 H/H  13.1/28.9 Platelets 202 UA: 21-50 WBC Ct head without contrast: EVD in appropriate position, no new bleeding   Assessment and Plan   Nontraumatic SAH with hydrocephalus s/p EVD 7/13 Headache Worse encephalopathy today-- worry fever is contributing -appreciate NS's management -repeat angiography early in the week -con't EVD to 15cm H2O -pain control as needed; tylenol  #3, topamax , oxycodone  PRN -con't keppra  -urine culture added; blood cultures to be collected -asking NS if we can collect CSF culture from drain -starting empiric antibiotics; need to cover for CNS empirically  -con't nimotop  and TCDs -goal SBP <160  Hyperglycemia- controlled -SSI PRN -goal BG 140-180  Dysphagia, poor PO intake -appreciate SLP's assistance -cortrak + TF  Urinary retention -flomax , bethanechol  -con't foley for now -sending urine culture  Electrolyte abnormalities - resolved -monitor  H/o tobaco abuse -recommend cessation -nicotine  patch -referral for lung cancer screening recommended at [facility]  Constipation -senna, miralax   Nausea and vomiting -zofran , phenergan  PRN  Best Practice (right click and Reselect all SmartList Selections daily)   Diet/type: tubefeeds DVT prophylaxis lovenox  Pressure ulcer(s): N/A GI prophylaxis: PPI Lines: N/A EVD Foley:  removal ordered  Code Status:  full code Last date of multidisciplinary goals of care discussion [daughter/exhusband updated]  Labs   CBC: Recent Labs  Lab 11/20/23 1630 11/20/23 1635 11/21/23 0121 11/21/23 0232 11/22/23 9358 11/23/23 0456  11/24/23 0545 11/25/23 0421  WBC 14.7*  --  12.5*  --  8.9 7.9 9.1 8.8  NEUTROABS  12.1*  --   --   --   --   --   --   --   HGB 12.3   < > 12.7 12.2 12.5 12.8 12.8 13.1  HCT 37.6   < > 37.8 36.0 37.3 39.0 38.4 38.9  MCV 83.4  --  80.9  --  81.4 82.3 82.9 81.0  PLT 196  --  188  --  164 159 202 202   < > = values in this interval not displayed.    Basic Metabolic Panel: Recent Labs  Lab 11/21/23 1854 11/22/23 0642 11/23/23 0456 11/23/23 0457 11/24/23 0545 11/24/23 0546 11/25/23 0421 11/26/23 0542  NA  --  139  --  144  --  137 135 140  K  --  3.4*  --  4.0  --  4.7 3.5 4.2  CL  --  107  --  105  --  102 102 101  CO2  --  23  --  26  --  25 22 22   GLUCOSE  --  136*  --  124*  --  109* 148* 147*  BUN  --  13  --  16  --  23 27* 31*  CREATININE  --  0.66  --  0.71  --  0.72 0.79 0.75  CALCIUM  --  9.2  --  9.5  --  9.4 9.1 9.4  MG 2.1 2.2 2.2  --  2.2  --   --  2.5*  PHOS 2.3* 1.8*  --  2.9 3.3 3.4 3.2 2.4*   GFR: Estimated Creatinine Clearance: 80.4 mL/min (by C-G formula based on SCr of 0.75 mg/dL). Recent Labs  Lab 11/22/23 0641 11/23/23 0456 11/24/23 0545 11/25/23 0421  WBC 8.9 7.9 9.1 8.8    Liver Function Tests: Recent Labs  Lab 11/20/23 1630 11/21/23 0121 11/22/23 9357 11/23/23 0457 11/24/23 0546 11/25/23 0421 11/26/23 0542  AST 29  --   --   --   --   --   --   ALT 20  --   --   --   --   --   --   ALKPHOS 53  --   --   --   --   --   --   BILITOT 0.8  --   --   --   --   --   --   PROT 5.8*  --   --   --   --   --   --   ALBUMIN 3.3*   < > 3.4* 3.4* 3.3* 3.2* 3.5   < > = values in this interval not displayed.   Critical care time:   41 min.    Leita SHAUNNA Gaskins, DO 11/26/23 3:07 PM [facility]  For contact information, see Amion. If no response to pager, please call PCCM consult pager. After hours, 7PM- 7AM, please call Elink.

## 2023-11-26 NOTE — Procedures (Signed)
 History: 63 yo F with lethargy in the setting of SAH, evaluate for evidence of seizure  EEG Duration: 22 minutes  Sedation: none  Patient State: Awake and asleep  Technique: This EEG was acquired with electrodes placed according to the International 10-20 electrode system (including Fp1, Fp2, F3, F4, C3, C4, P3, P4, O1, O2, T3, T4, T5, T6, A1, A2, Fz, Cz, Pz). The following electrodes were missing or displaced: none.   Background:  There is a posterior dominant rhythm of 8 to 9 Hz which is seen at times.  There is also irregular delta and theta activity intruding into the background.  With drowsiness there is an increase in slow activity and the patient does have symmetric appearing sleep structure seen during the recording.  Photic stimulation: Physiologic driving is not performed  EEG Abnormalities: 1) generalized irregular slow activity  Clinical Interpretation: This EEG is consistent with a mild generalized nonspecific cerebral dysfunction (encephalopathy). There was no seizure or seizure predisposition recorded on this study. Please note that lack of epileptiform activity on EEG does not preclude the possibility of epilepsy.   Aisha Seals, MD [facility]   If 7pm- 7am, please page neurology on call as listed in AMION.

## 2023-11-26 NOTE — Progress Notes (Signed)
 Pharmacy Antibiotic Note  Andrea Bridges is a 63 y.o. female admitted on 11/20/2023 with sepsis.  Pharmacy has been consulted for cefepime  and vancomycin  dosing. Patient is a meningitis rule out given EVD drain earlier this week.   Plan: Cefepime  2g IV q8 hours Vancomycin  2000mg  IV x1 then 1000mg  IV q12 hours (eAUC 522, scr 0.8, Vd 0.7) Monitor clinical progress and order levels as appropriate  Height: 5' 7 (170.2 cm) Weight: 84.7 kg (186 lb 11.7 oz) IBW/kg (Calculated) : 61.6  Temp (24hrs), Avg:99.3 F (37.4 C), Min:98.1 F (36.7 C), Max:100.6 F (38.1 C)  Recent Labs  Lab 11/21/23 0121 11/22/23 0641 11/22/23 0642 11/23/23 0456 11/23/23 0457 11/24/23 0545 11/24/23 0546 11/25/23 0421 11/26/23 0542  WBC 12.5* 8.9  --  7.9  --  9.1  --  8.8  --   CREATININE 0.58  --  0.66  --  0.71  --  0.72 0.79 0.75    Estimated Creatinine Clearance: 80.4 mL/min (by C-G formula based on SCr of 0.75 mg/dL).    No Known Allergies  Thank you for allowing pharmacy to be a part of this patient's care.  Koren CROME Legrand Lasser 11/26/2023 3:12 PM

## 2023-11-26 NOTE — Progress Notes (Signed)
 EEG complete - results pending

## 2023-11-26 NOTE — Progress Notes (Signed)
 Subjective: Patient reports minimal headache  Objective: Vital signs in last 24 hours: Temp:  [97.7 F (36.5 C)-100.6 F (38.1 C)] 100.4 F (38 C) (07/19 0800) Pulse Rate:  [58-85] 58 (07/19 0900) Resp:  [12-18] 17 (07/19 0900) BP: (104-143)/(59-77) 142/68 (07/19 0900) SpO2:  [92 %-96 %] 96 % (07/19 0900) Weight:  [84.7 kg] 84.7 kg (07/19 0600)  Intake/Output from previous day: 07/18 0701 - 07/19 0700 In: 1375 [NG/GT:1375] Out: 1341 [Urine:1075; Drains:266] Intake/Output this shift: Total I/O In: 55 [NG/GT:55] Out: 231 [Urine:225; Drains:6]  Sleepy, eyes open to voice Oriented to person, month, place No pronator drift Follows commands in lower extremities EVD was flushed at the bedside using sterile technique, functioning well  Lab Results: Recent Labs    11/24/23 0545 11/25/23 0421  WBC 9.1 8.8  HGB 12.8 13.1  HCT 38.4 38.9  PLT 202 202   BMET Recent Labs    11/25/23 0421 11/26/23 0542  NA 135 140  K 3.5 4.2  CL 102 101  CO2 22 22  GLUCOSE 148* 147*  BUN 27* 31*  CREATININE 0.79 0.75  CALCIUM 9.1 9.4    Studies/Results: CT HEAD WO CONTRAST ( ) Result Date: 11/25/2023 EXAM: CT HEAD WITHOUT CONTRAST 11/25/2023 04:29:05 PM TECHNIQUE: CT of the head was performed without the administration of intravenous contrast. Automated exposure control, iterative reconstruction, and/or weight based adjustment of the mA/kV was utilized to reduce the radiation dose to as low as reasonably achievable. COMPARISON: CT head 11/20/2023. CLINICAL HISTORY: Headache, increasing frequency or severity. FINDINGS: BRAIN AND VENTRICLES: Interval placement of a right frontal approach ventriculostomy and catheter with tip terminating in the third ventricle. Small amount of parenchymal hemorrhage along the ventriculostomy catheter tract. Unchanged mild dilation of the lateral ventricles. Interval redistribution of subarachnoid hemorrhage along the cerebral convexities and associated slight  increase in intraventricular hemorrhage. ORBITS: No acute abnormality. SINUSES: No acute abnormality. SOFT TISSUES AND SKULL: No acute soft tissue abnormality. No skull fracture. IMPRESSION: 1. Interval placement of a right frontal approach ventriculostomy and catheter with tip terminating in the third ventricle. Small amount of parenchymal hemorrhage along the ventriculostomy catheter tract. 2. Unchanged mild dilation of the lateral ventricles. 3. Interval redistribution of subarachnoid hemorrhage along the cerebral convexities and associated slight increase in intraventricular hemorrhage. Electronically signed by: Ryan Chess MD 11/25/2023 04:41 PM EDT RP Workstation: [id number]   VAS US  TRANSCRANIAL DOPPLER Result Date: 11/25/2023  Transcranial Doppler Patient Name:  Andrea Bridges  Date of Exam:   11/25/2023 Medical Rec #: [id number]   Accession #:    [id number] Date of Birth: 01-Jun-1960   Patient Gender: F Patient Age:   63 years Exam Location:  [facility] Procedure:      VAS US  TRANSCRANIAL DOPPLER Referring Phys: TORIBIO SHARPS --------------------------------------------------------------------------------  Indications: Subarachnoid hemorrhage. History: Right frontal ventricular catheter placement (11/20/2023). Comparison Study: Previous exam on 11/23/2023 Performing Technologist: Ezzie Potters RVT, RDMS  Examination Guidelines: A complete evaluation includes B-mode imaging, spectral Doppler, color Doppler, and power Doppler as needed of all accessible portions of each vessel. Bilateral testing is considered an integral part of a complete examination. Limited examinations for reoccurring indications may be performed as noted.  +----------+---------------+----------+-----------+--------+ RIGHT TCD Right VM (cm/s)Depth (cm)PulsatilityComment  +----------+---------------+----------+-----------+--------+ MCA             70          5.20      1.30    130 cm/s  +----------+---------------+----------+-----------+--------+ ACA             -  38                   1.14             +----------+---------------+----------+-----------+--------+ Term ICA        48                    1.31             +----------+---------------+----------+-----------+--------+ PCA P1          35                    1.33             +----------+---------------+----------+-----------+--------+ Opthalmic       15                    2.04             +----------+---------------+----------+-----------+--------+ ICA siphon      38                    1.14             +----------+---------------+----------+-----------+--------+ Vertebral       -33                   0.98             +----------+---------------+----------+-----------+--------+ Distal ICA      36                    1.22             +----------+---------------+----------+-----------+--------+  +----------+--------------+----------+-----------+-------+ LEFT TCD  Left VM (cm/s)Depth (cm)PulsatilityComment +----------+--------------+----------+-----------+-------+ MCA             63                   1.21            +----------+--------------+----------+-----------+-------+ ACA            -34                   1.17            +----------+--------------+----------+-----------+-------+ Term ICA        45                   1.42            +----------+--------------+----------+-----------+-------+ PCA P1          30                   1.22            +----------+--------------+----------+-----------+-------+ Opthalmic       14                   2.17            +----------+--------------+----------+-----------+-------+ ICA siphon      33                   1.11            +----------+--------------+----------+-----------+-------+ Vertebral      -34                   1.25            +----------+--------------+----------+-----------+-------+ Distal ICA      25  1.14            +----------+--------------+----------+-----------+-------+  +------------+-------+-------+             VM cm/sComment +------------+-------+-------+ Prox Basilar  -41   0.98   +------------+-------+-------+ Dist Basilar  -52   0.96   +------------+-------+-------+ +----------------------+----+ Right Lindegaard Ratio1.94 +----------------------+----+ +---------------------+----+ Left Lindegaard Ratio2.52 +---------------------+----+  Summary: This was a normal transcranial Doppler study, with normal flow direction and velocity of all identified vessels of the anterior and posterior circulations, with no evidence of stenosis, vasospasm or occlusion. There was no evidence of intracranial disease.  *See table(s) above for TCD measurements and observations.  Diagnosing physician: Eather Popp MD Electronically signed by Eather Popp MD on 11/25/2023 at 1:18:57 PM.    Final     Assessment/Plan: This is a 63 year old woman with nontraumatic subarachnoid hemorrhage, angio negative -Plan for angio next week - Continue EVD open to drain at 15 cm - Continue Nimotop    Dorn Andrea Bridges 11/26/2023, 10:58 AM

## 2023-11-26 NOTE — Progress Notes (Addendum)
 STROKE TEAM PROGRESS NOTE    SIGNIFICANT HOSPITAL EVENTS  7/13: EMS called d/t acute headache CTH shows diffuse SAH with IV extension and ventriculomegaly  INTERIM HISTORY/SUBJECTIVE Family at bedside.  BP well-controlled without use of drips.  Continues to be lethargic, but will open eyes and follow commands with continued stimulation.  EVD output decreased overnight, drainage is serosanguineous.  Transcranial Doppler study showed no evidence of vasospasm.  Will repeat routine EEG.   OBJECTIVE  CBC    Component Value Date/Time   WBC 8.8 11/25/2023 0421   RBC 4.80 11/25/2023 0421   HGB 13.1 11/25/2023 0421   HGB 13.5 09/08/2022 0854   HCT 38.9 11/25/2023 0421   HCT 43.1 09/08/2022 0854   PLT 202 11/25/2023 0421   PLT 219 09/08/2022 0854   MCV 81.0 11/25/2023 0421   MCV 84 09/08/2022 0854   MCH 27.3 11/25/2023 0421   MCHC 33.7 11/25/2023 0421   RDW 13.4 11/25/2023 0421   RDW 14.7 09/08/2022 0854   LYMPHSABS 1.9 11/20/2023 1630   LYMPHSABS 1.7 09/08/2022 0854   MONOABS 0.5 11/20/2023 1630   EOSABS 0.0 11/20/2023 1630   EOSABS 0.1 09/08/2022 0854   BASOSABS 0.1 11/20/2023 1630   BASOSABS 0.0 09/08/2022 0854    BMET    Component Value Date/Time   NA 140 11/26/2023 0542   NA 139 03/17/2023 0936   K 4.2 11/26/2023 0542   CL 101 11/26/2023 0542   CO2 22 11/26/2023 0542   GLUCOSE 147 (H) 11/26/2023 0542   BUN 31 (H) 11/26/2023 0542   BUN 21 03/17/2023 0936   CREATININE 0.75 11/26/2023 0542   CALCIUM 9.4 11/26/2023 0542   EGFR 72 03/17/2023 0936   GFRNONAA >60 11/26/2023 0542    IMAGING past 24 hours CT HEAD WO CONTRAST ( ) Result Date: 11/25/2023 EXAM: CT HEAD WITHOUT CONTRAST 11/25/2023 04:29:05 PM TECHNIQUE: CT of the head was performed without the administration of intravenous contrast. Automated exposure control, iterative reconstruction, and/or weight based adjustment of the mA/kV was utilized to reduce the radiation dose to as low as reasonably  achievable. COMPARISON: CT head 11/20/2023. CLINICAL HISTORY: Headache, increasing frequency or severity. FINDINGS: BRAIN AND VENTRICLES: Interval placement of a right frontal approach ventriculostomy and catheter with tip terminating in the third ventricle. Small amount of parenchymal hemorrhage along the ventriculostomy catheter tract. Unchanged mild dilation of the lateral ventricles. Interval redistribution of subarachnoid hemorrhage along the cerebral convexities and associated slight increase in intraventricular hemorrhage. ORBITS: No acute abnormality. SINUSES: No acute abnormality. SOFT TISSUES AND SKULL: No acute soft tissue abnormality. No skull fracture. IMPRESSION: 1. Interval placement of a right frontal approach ventriculostomy and catheter with tip terminating in the third ventricle. Small amount of parenchymal hemorrhage along the ventriculostomy catheter tract. 2. Unchanged mild dilation of the lateral ventricles. 3. Interval redistribution of subarachnoid hemorrhage along the cerebral convexities and associated slight increase in intraventricular hemorrhage. Electronically signed by: Ryan Chess MD 11/25/2023 04:41 PM EDT RP Workstation: [id number]   VAS US  TRANSCRANIAL DOPPLER Result Date: 11/25/2023  Transcranial Doppler Patient Name:  Andrea Bridges  Date of Exam:   11/25/2023 Medical Rec #: [id number]   Accession #:    [id number] Date of Birth: [birth year]   Patient Gender: F Patient Age:   63 years Exam Location:  [facility] Procedure:      VAS US  TRANSCRANIAL DOPPLER Referring Phys: TORIBIO SHARPS --------------------------------------------------------------------------------  Indications: Subarachnoid hemorrhage. History: Right frontal ventricular catheter placement (11/20/2023). Comparison Study: Previous exam on 11/23/2023  Performing Technologist: Ezzie Potters RVT, RDMS  Examination Guidelines: A complete evaluation includes B-mode imaging, spectral Doppler, color Doppler, and power  Doppler as needed of all accessible portions of each vessel. Bilateral testing is considered an integral part of a complete examination. Limited examinations for reoccurring indications may be performed as noted.  +----------+---------------+----------+-----------+--------+ RIGHT TCD Right VM (cm/s)Depth (cm)PulsatilityComment  +----------+---------------+----------+-----------+--------+ MCA             70          5.20      1.30    130 cm/s +----------+---------------+----------+-----------+--------+ ACA             -38                   1.14             +----------+---------------+----------+-----------+--------+ Term ICA        48                    1.31             +----------+---------------+----------+-----------+--------+ PCA P1          35                    1.33             +----------+---------------+----------+-----------+--------+ Opthalmic       15                    2.04             +----------+---------------+----------+-----------+--------+ ICA siphon      38                    1.14             +----------+---------------+----------+-----------+--------+ Vertebral       -33                   0.98             +----------+---------------+----------+-----------+--------+ Distal ICA      36                    1.22             +----------+---------------+----------+-----------+--------+  +----------+--------------+----------+-----------+-------+ LEFT TCD  Left VM (cm/s)Depth (cm)PulsatilityComment +----------+--------------+----------+-----------+-------+ MCA             63                   1.21            +----------+--------------+----------+-----------+-------+ ACA            -34                   1.17            +----------+--------------+----------+-----------+-------+ Term ICA        45                   1.42            +----------+--------------+----------+-----------+-------+ PCA P1          30                    1.22            +----------+--------------+----------+-----------+-------+ Opthalmic       14  2.17            +----------+--------------+----------+-----------+-------+ ICA siphon      33                   1.11            +----------+--------------+----------+-----------+-------+ Vertebral      -34                   1.25            +----------+--------------+----------+-----------+-------+ Distal ICA      25                   1.14            +----------+--------------+----------+-----------+-------+  +------------+-------+-------+             VM cm/sComment +------------+-------+-------+ Prox Basilar  -41   0.98   +------------+-------+-------+ Dist Basilar  -52   0.96   +------------+-------+-------+ +----------------------+----+ Right Lindegaard Ratio1.94 +----------------------+----+ +---------------------+----+ Left Lindegaard Ratio2.52 +---------------------+----+  Summary: This was a normal transcranial Doppler study, with normal flow direction and velocity of all identified vessels of the anterior and posterior circulations, with no evidence of stenosis, vasospasm or occlusion. There was no evidence of intracranial disease.  *See table(s) above for TCD measurements and observations.  Diagnosing physician: Eather Popp MD Electronically signed by Eather Popp MD on 11/25/2023 at 1:18:57 PM.    Final      Vitals:   11/26/23 0800 11/26/23 0900 11/26/23 1000 11/26/23 1100  BP: (!) 141/64 (!) 142/68 139/62 124/63  Pulse: 64 (!) 58 69 76  Resp: 18 17 (!) 22 16  Temp: (!) 100.4 F (38 C)     TempSrc: Axillary     SpO2: 95% 96% 94% 95%  Weight:      Height:         PHYSICAL EXAM General:  Alert, pleasant middle-age Caucasian lady in no acute distress.  She has ventriculostomy catheter on the right CV: Regular rate and rhythm on monitor Respiratory:  Unlabored, room air.    NEURO:  Mental Status: opens eyes spontaneously. Oriented to  self, place, time. Disoriented to age.  Speech/Language: Follows commands after multiple requests and stimulation. Hypophonic, dysarthric voice.  Cranial Nerves:  PERRL, Blinks to threat bilaterally, EOMI. Hearing intact to voice, Facial movement symmetric.  Tongue midline.   BUEs drift present with R faster than left.  BLEs mild withdraw to pain.  Sensation, coordination and gait not tested       ASSESSMENT/PLAN  Ms. Andrea Bridges is a 63 y.o. female with history of PCOS, smoking/vaping, obesity, skin CA, anxiety  who presented with acute onset of headache 7/13.  Imaging revealed large subarachnoid hemorrhage, ventricular drain placed by neurosurgery.  Admitted to critical care for continued medical management.  NIH on Admission: 27.  Diffuse SAH with IVH, etiology: unclear, pending angio to rule out aneurysm Code Stroke CT head  Diffuse basilar subarachnoid hemorrhage with intraventricular extension and early ventriculomegaly, greatest along the left cerebellopontine angle cistern, possibly indicating the site of origin.  CTA head & neck  No definite aneurysm or vascular malformation is identified. Abrupt tapering of the V4 segment of both vertebral arteries and the distal basilar artery, concerning for early vasospasm, which could be masking an aneurysm.  Angiogram:11/22/2023 : Limited right vertebral injection only no evidence of vasospasm, aneurysm or AVM Plan to take back for angio on Monday CT repeat 7/18 stable SAH and ventriculomegaly TCD  (  QMWF): Negative so far VTE prophylaxis - SCD No antithrombotic prior to admission, now on No antithrombotic due to [facility] Continue nimodipine  Continue Keppra  Therapy recommendations:  Pending re-eval  Disposition:  pending  Hydrocephalus Status post EVD Now at 15cmH2O EVD drainage has decreased CT repeat 7/18 stable ventriculomegaly Measuring per NSG  AMS More somnolence from Thursday afternoon Currently still has fluctuating mental  status EEG no seizure x 2 CT repeat 7/18 stable TCD currently no evidence of vasospasm Further management per NSG  Hypertension Home meds:  none Stable On amlodipine BP Goal: less than 160 Long-term BP goal normotensive  Tobacco Abuse Patient smokes or vapes daily      Ready to quit? N/A, revisit when able to participate.  Nicotine  replacement therapy provided  Dysphagia Patient has post-stroke dysphagia SLP consulted On diet but not eating due to altered mental status On tube feeding @ 55  Other Stroke Risk Factors Obesity, Body mass index is 29.25 kg/m., BMI >/= 30 associated with increased stroke risk, recommend weight loss, diet and exercise as appropriate   Other acute issues Low-grade fever temperature 100.6-100.4   Hospital day # 6   Pt seen by Neuro NP/APP with MD. Note/plan to be edited by MD as needed.    Andrea JAYSON Likes, DNP, AGACNP-BC [facility] Please use AMION for contact information & EPIC for messaging.  ATTENDING NOTE: I reviewed above note and agree with the assessment and plan. Pt was seen and examined.   Husband is at the bedside. Pt is drowsy sleepy, hard to arouse, briefly open eyes with repetitive stimulation, very soft voice, able to state her name and told me age 24. Severely dysarthric voice. Not following commands or name or repeat. With both arms put in the hold ing position, BUEs drift but R drifted faster than left. BLEs mild withdraw to pain. Sensation, coordination and gait not tested.   However, moments later, when Dr. Debby stepped in, on exam, pt seems more awake, opened eyes on voice and able to maintain eyes opening for prolonged time, able to stated July and hospital with delayed response, able to following simple commands, able to hold BUEs without drift. Wiggled b/l toes per command. Ex-husband said that pt mental status was about the same as yesterday and HA has improved.    CT repeat yesterday showed stable  SAH and ventriculomegaly. EVD drainage at 15cmH2O, flow has decreased this morning. Plan for angio on Monday. TCD so far no evidence of vasospasm. Repeat EEG showed no seizure. Continue current meds with nimodipine , and keppra . SAH management per NSG  For detailed assessment and plan, please refer to above as I have made changes wherever appropriate.   Neurology will sign off. Please call with questions. Thanks for the consult.  Ary Cummins, MD PhD Stroke Neurology 11/26/2023 6:41 PM  This patient is critically ill due to [facility], IVH, hydrocephalus, dysphagia and at significant risk of neurological worsening, death form obstructive hydrocephalus, vasospasm, stroke, aspiration, sepsis, status epilepticus. This patient's care requires constant monitoring of vital signs, hemodynamics, respiratory and cardiac monitoring, review of multiple databases, neurological assessment, discussion with family, other specialists and medical decision making of high complexity. I spent 40 minutes of neurocritical care time in the care of this patient. I had long discussion with ex-husband at bedside, updated pt current condition, treatment plan and potential prognosis, and answered all the questions.  He expressed understanding and appreciation.  I also discussed with NSG Dr. Debby     To  contact Stroke Continuity provider, please refer to [email or web address]. After hours, contact General Neurology

## 2023-11-27 DIAGNOSIS — R1310 Dysphagia, unspecified: Secondary | ICD-10-CM | POA: Diagnosis not present

## 2023-11-27 DIAGNOSIS — R739 Hyperglycemia, unspecified: Secondary | ICD-10-CM | POA: Diagnosis not present

## 2023-11-27 DIAGNOSIS — I609 Nontraumatic subarachnoid hemorrhage, unspecified: Secondary | ICD-10-CM | POA: Diagnosis not present

## 2023-11-27 DIAGNOSIS — G441 Vascular headache, not elsewhere classified: Secondary | ICD-10-CM | POA: Diagnosis not present

## 2023-11-27 LAB — RENAL FUNCTION PANEL
Albumin: 3.3 g/dL — ABNORMAL LOW (ref 3.5–5.0)
Anion gap: 13 (ref 5–15)
BUN: 31 mg/dL — ABNORMAL HIGH (ref 8–23)
CO2: 19 mmol/L — ABNORMAL LOW (ref 22–32)
Calcium: 9.5 mg/dL (ref 8.9–10.3)
Chloride: 104 mmol/L (ref 98–111)
Creatinine, Ser: 0.69 mg/dL (ref 0.44–1.00)
GFR, Estimated: >60 mL/min (ref >60)
Glucose, Bld: 159 mg/dL — ABNORMAL HIGH (ref 70–99)
Phosphorus: 1.9 mg/dL — ABNORMAL LOW (ref 2.5–4.6)
Potassium: 4 mmol/L (ref 3.5–5.1)
Sodium: 136 mmol/L (ref 135–145)

## 2023-11-27 LAB — CSF CELL COUNT WITH DIFFERENTIAL
Eosinophils, CSF: 3 % — ABNORMAL HIGH (ref 0–1)
Lymphs, CSF: 6 % — ABNORMAL LOW (ref 40–80)
Monocyte-Macrophage-Spinal Fluid: 5 % — ABNORMAL LOW (ref 15–45)
RBC Count, CSF: 19500 /mm3 — ABNORMAL HIGH
Segmented Neutrophils-CSF: 86 % — ABNORMAL HIGH (ref 0–6)
WBC, CSF: 80 /mm3 (ref 0–5)

## 2023-11-27 LAB — GLUCOSE, CAPILLARY
Glucose-Capillary: 112 mg/dL — ABNORMAL HIGH (ref 70–99)
Glucose-Capillary: 118 mg/dL — ABNORMAL HIGH (ref 70–99)
Glucose-Capillary: 124 mg/dL — ABNORMAL HIGH (ref 70–99)
Glucose-Capillary: 137 mg/dL — ABNORMAL HIGH (ref 70–99)
Glucose-Capillary: 141 mg/dL — ABNORMAL HIGH (ref 70–99)
Glucose-Capillary: 144 mg/dL — ABNORMAL HIGH (ref 70–99)

## 2023-11-27 LAB — PROTEIN AND GLUCOSE, CSF
Glucose, CSF: 77 mg/dL — ABNORMAL HIGH (ref 40–70)
Total  Protein, CSF: 86 mg/dL — ABNORMAL HIGH (ref 15–45)

## 2023-11-27 MED ORDER — SODIUM CHLORIDE 0.9 % IV SOLN
2.0000 g | Freq: Three times a day (TID) | INTRAVENOUS | Status: DC
  Administered 2023-11-27 – 2023-11-28 (×3): 2 g via INTRAVENOUS
  Filled 2023-11-27 (×3): qty 12.5

## 2023-11-27 MED ORDER — SODIUM PHOSPHATES 45 MMOLE/15ML IV SOLN
30.0000 mmol | Freq: Once | INTRAVENOUS | Status: AC
  Administered 2023-11-27: 30 mmol via INTRAVENOUS
  Filled 2023-11-27: qty 10

## 2023-11-27 MED ORDER — SORBITOL 70 % SOLN
60.0000 mL | Freq: Once | Status: DC
  Filled 2023-11-27: qty 60

## 2023-11-27 NOTE — Progress Notes (Signed)
 NAME:  Andrea Bridges, MRN:  [id number], DOB:  [birth year], LOS: 7 ADMISSION DATE:  11/20/2023, CONSULTATION DATE:  11/20/23 REFERRING MD:  EDP, CHIEF COMPLAINT:  headache   History of Present Illness:  63 year old woman w/ hx of smoking, obesity presenting with headache.  Workup reveals large subarachnoid hemorrhage.  CTA neg but V4 of both vertebrals and distal basilar artery taper off concerning for possible early vasospasm and making full eval of this area difficult.  NSGY coming in to consider drain.  Intermittent purposeful movement and occasionally says ow to nursing.  PCCM to admit.  Significant Hospital Events: Including procedures, antibiotic start and stop dates in addition to other pertinent events   11/20/23 admit, EVD placement 7/13 CT head>>  1. Diffuse basilar subarachnoid hemorrhage with intraventricular extension and early ventriculomegaly, greatest along the left cerebellopontine angle cistern, possibly indicating the site of origin. 7/14 EEG>>This study is suggestive of moderate to severe diffuse encephalopathy, nonspecific etiology but likely related to sedation. No seizures or epileptiform discharges were seen throughout the recording. Extubated. 7/15 limited angiogram- no aneurysms, AVMs, or fistulas. CVC& aline removed.   Interim History / Subjective:  No CSF culture obtained yesterday. Tmax 100.7 this morning. No BM since admission, has some mild lower abdominal discomfort.  Objective    Blood pressure 118/61, pulse 84, temperature (!) 100.7 F (38.2 C), temperature source Axillary, resp. rate 20, height 5' 7 (1.702 m), weight 84.7 kg, SpO2 95%.        Intake/Output Summary (Last 24 hours) at 11/27/2023 0919 Last data filed at 11/27/2023 0900 Gross per 24 hour  Intake 1909.24 ml  Output 2460 ml  Net -550.76 ml   Filed Weights   11/24/23 0500 11/25/23 0704 11/26/23 0600  Weight: 85.9 kg 85.9 kg 84.7 kg    Examination: General: Ill-appearing woman lying in bed no  acute distress HENT: EVD with serosanguineous output Lungs: Breathing comfortably on room air.  CTAB. Cardiovascular: S1-S2, regular rate Abdomen: Soft, nontender, nondistended Extremities: No peripheral edema Neuro: Awake, alert.  Answering questions appropriately.  Strength more intact today, symmetric.  Following commands in all extremities.  BUN 31 Cr 0.69 Phos 1.9  Blood culture: NGTD Urine culture: pending EEG: mild diffuse encephalopathy   Assessment and Plan   Nontraumatic SAH with hydrocephalus s/p EVD 7/13 Headache Worse encephalopathy today-- worry fever is contributing -Appreciate neurosurgery's management. - Sending CSF sample for studies to r/o infection -Plan for repeat angiography this week - Continue EVD to 15 cm H2O -Scheduled Tylenol  3, Topamax .  Oxycodone  as needed - Continue nimotop  - Serial neuroexams.  Hyperglycemia- controlled - SSI as needed - Goal blood glucose 140 and 180  Fevers associated with encephalopathy, concern for infection.  Chest x-ray unrevealing.  Concern for UTI - Broad-spectrum antibiotics, will narrow as able - CSF studies today.  Dysphagia, poor PO intake -Continue core track and tube feeds - Appreciate SLP's assistance  Constipation -con't senna & miralax  -sorbitol   Urinary retention -Continue Flomax  and bethanechol  - Trial of discontinuing Foley today - Follow urine culture; continue antibiotics  Hypophosphatemia -Repleted - Monitor  H/o tobaco abuse -Has been counseled on the importance of quitting - Nicotine  patch - Referral for lung cancer screening recommended at discharge.  Nausea and vomiting -Zofran  and Phenergan  as needed    Best Practice (right click and Reselect all SmartList Selections daily)   Diet/type: tubefeeds DVT prophylaxis lovenox  Pressure ulcer(s): N/A GI prophylaxis: PPI Lines: N/A EVD Foley:  removal ordered  Code  Status:  full code Last date of multidisciplinary goals of  care discussion [daughter/exhusband updated]  Labs   CBC: Recent Labs  Lab 11/20/23 1630 11/20/23 1635 11/21/23 0121 11/21/23 0232 11/22/23 0641 11/23/23 0456 11/24/23 0545 11/25/23 0421  WBC 14.7*  --  12.5*  --  8.9 7.9 9.1 8.8  NEUTROABS 12.1*  --   --   --   --   --   --   --   HGB 12.3   < > 12.7 12.2 12.5 12.8 12.8 13.1  HCT 37.6   < > 37.8 36.0 37.3 39.0 38.4 38.9  MCV 83.4  --  80.9  --  81.4 82.3 82.9 81.0  PLT 196  --  188  --  164 159 202 202   < > = values in this interval not displayed.    Basic Metabolic Panel: Recent Labs  Lab 11/21/23 1854 11/22/23 9357 11/23/23 0456 11/23/23 0457 11/24/23 0545 11/24/23 0546 11/25/23 0421 11/26/23 0542 11/27/23 0618  NA  --  139  --  144  --  137 135 140 136  K  --  3.4*  --  4.0  --  4.7 3.5 4.2 4.0  CL  --  107  --  105  --  102 102 101 104  CO2  --  23  --  26  --  25 22 22  19*  GLUCOSE  --  136*  --  124*  --  109* 148* 147* 159*  BUN  --  13  --  16  --  23 27* 31* 31*  CREATININE  --  0.66  --  0.71  --  0.72 0.79 0.75 0.69  CALCIUM  --  9.2  --  9.5  --  9.4 9.1 9.4 9.5  MG 2.1 2.2 2.2  --  2.2  --   --  2.5*  --   PHOS 2.3* 1.8*  --  2.9 3.3 3.4 3.2 2.4* 1.9*   GFR: Estimated Creatinine Clearance: 80.4 mL/min (by C-G formula based on SCr of 0.69 mg/dL). Recent Labs  Lab 11/22/23 0641 11/23/23 0456 11/24/23 0545 11/25/23 0421  WBC 8.9 7.9 9.1 8.8    Liver Function Tests: Recent Labs  Lab 11/20/23 1630 11/21/23 0121 11/23/23 0457 11/24/23 0546 11/25/23 0421 11/26/23 0542 11/27/23 0618  AST 29  --   --   --   --   --   --   ALT 20  --   --   --   --   --   --   ALKPHOS 53  --   --   --   --   --   --   BILITOT 0.8  --   --   --   --   --   --   PROT 5.8*  --   --   --   --   --   --   ALBUMIN 3.3*   < > 3.4* 3.3* 3.2* 3.5 3.3*   < > = values in this interval not displayed.   Critical care time:  34 min    Leita SHAUNNA Gaskins, DO 11/27/23 11:36 AM [facility]  For  contact information, see Amion. If no response to pager, please call PCCM consult pager. After hours, 7PM- 7AM, please call Elink.

## 2023-11-27 NOTE — Progress Notes (Signed)
 Pharmacy Electrolyte Replacement  Recent Labs:  Recent Labs    11/26/23 0542 11/27/23 0618  K 4.2 4.0  MG 2.5*  --   PHOS 2.4* 1.9*  CREATININE 0.75 0.69    Low Critical Values (K </= 2.5, Phos </= 1, Mg </= 1) Present: None  MD Contacted: n/a   Plan: 30 mmol NaPhos IV x1   Rankin Sams, PharmD, BCPS, BCCCP Clinical Pharmacist

## 2023-11-27 NOTE — Progress Notes (Signed)
 Subjective: No acute distress.  No headaches.  Objective: Vital signs in last 24 hours: Temp:  [98.1 F (36.7 C)-100.7 F (38.2 C)] 100.7 F (38.2 C) (07/20 0800) Pulse Rate:  [65-89] 84 (07/20 0900) Resp:  [14-28] 20 (07/20 0900) BP: (118-136)/(61-111) 118/61 (07/20 0900) SpO2:  [92 %-98 %] 95 % (07/20 0900)  Intake/Output from previous day: 07/19 0701 - 07/20 0700 In: 1592.9 [NG/GT:1210; IV Piggyback:382.9] Out: 2437 [Urine:2195; Drains:242] Intake/Output this shift: Total I/O In: 371.3 [NG/GT:165; IV Piggyback:206.3] Out: 329 [Urine:295; Drains:34]  NAD More alert today.  Oriented to person, place CSF more clear, blood-tinged Follows commands x 4  Lab Results: Recent Labs    11/25/23 0421  WBC 8.8  HGB 13.1  HCT 38.9  PLT 202   BMET Recent Labs    11/26/23 0542 11/27/23 0618  NA 140 136  K 4.2 4.0  CL 101 104  CO2 22 19*  GLUCOSE 147* 159*  BUN 31* 31*  CREATININE 0.75 0.69  CALCIUM 9.4 9.5    Studies/Results: DG CHEST PORT 1 VIEW Result Date: 11/26/2023 CLINICAL DATA:  Fever.  Subarachnoid hemorrhage EXAM: PORTABLE CHEST 1 VIEW COMPARISON:  Chest x-ray 11/21/2023. FINDINGS: Enteric tube with tip extending beneath the diaphragm. Overlapping cardiac leads. No pneumothorax or edema. No effusion. Normal cardiopericardial silhouette. Mild bandlike changes along the lung bases. Atelectasis is favored over infiltrate but recommend follow-up. IMPRESSION: Bandlike opacities along the lung bases. Favor atelectasis over infiltrate but recommend follow-up. Enteric tube. Electronically Signed   By: Ranell Bring M.D.   On: 11/26/2023 18:56   EEG adult Result Date: 11/26/2023 Michaela Aisha SQUIBB, MD     11/26/2023  3:11 PM History: 63 yo F with lethargy in the setting of SAH, evaluate for evidence of seizure EEG Duration: 22 minutes Sedation: none Patient State: Awake and asleep Technique: This EEG was acquired with electrodes placed according to the International  10-20 electrode system (including Fp1, Fp2, F3, F4, C3, C4, P3, P4, O1, O2, T3, T4, T5, T6, A1, A2, Fz, Cz, Pz). The following electrodes were missing or displaced: none. Background:  There is a posterior dominant rhythm of 8 to 9 Hz which is seen at times.  There is also irregular delta and theta activity intruding into the background.  With drowsiness there is an increase in slow activity and the patient does have symmetric appearing sleep structure seen during the recording. Photic stimulation: Physiologic driving is not performed EEG Abnormalities: 1) generalized irregular slow activity Clinical Interpretation: This EEG is consistent with a mild generalized nonspecific cerebral dysfunction (encephalopathy). There was no seizure or seizure predisposition recorded on this study. Please note that lack of epileptiform activity on EEG does not preclude the possibility of epilepsy. Aisha Michaela, MD [facility] If 7pm- 7am, please page neurology on call as listed in AMION.  CT HEAD WO CONTRAST ( ) Result Date: 11/25/2023 EXAM: CT HEAD WITHOUT CONTRAST 11/25/2023 04:29:05 PM TECHNIQUE: CT of the head was performed without the administration of intravenous contrast. Automated exposure control, iterative reconstruction, and/or weight based adjustment of the mA/kV was utilized to reduce the radiation dose to as low as reasonably achievable. COMPARISON: CT head 11/20/2023. CLINICAL HISTORY: Headache, increasing frequency or severity. FINDINGS: BRAIN AND VENTRICLES: Interval placement of a right frontal approach ventriculostomy and catheter with tip terminating in the third ventricle. Small amount of parenchymal hemorrhage along the ventriculostomy catheter tract. Unchanged mild dilation of the lateral ventricles. Interval redistribution of subarachnoid hemorrhage along the cerebral convexities and associated  slight increase in intraventricular hemorrhage. ORBITS: No acute abnormality. SINUSES: No  acute abnormality. SOFT TISSUES AND SKULL: No acute soft tissue abnormality. No skull fracture. IMPRESSION: 1. Interval placement of a right frontal approach ventriculostomy and catheter with tip terminating in the third ventricle. Small amount of parenchymal hemorrhage along the ventriculostomy catheter tract. 2. Unchanged mild dilation of the lateral ventricles. 3. Interval redistribution of subarachnoid hemorrhage along the cerebral convexities and associated slight increase in intraventricular hemorrhage. Electronically signed by: Ryan Chess MD 11/25/2023 04:41 PM EDT RP Workstation: [id number]   VAS US  TRANSCRANIAL DOPPLER Result Date: 11/25/2023  Transcranial Doppler Patient Name:  Andrea Bridges  Date of Exam:   11/25/2023 Medical Rec #: [id number]   Accession #:    [id number] Date of Birth: 01-06-1961   Patient Gender: F Patient Age:   63 years Exam Location:  [facility] Procedure:      VAS US  TRANSCRANIAL DOPPLER Referring Phys: TORIBIO SHARPS --------------------------------------------------------------------------------  Indications: Subarachnoid hemorrhage. History: Right frontal ventricular catheter placement (11/20/2023). Comparison Study: Previous exam on 11/23/2023 Performing Technologist: Ezzie Potters RVT, RDMS  Examination Guidelines: A complete evaluation includes B-mode imaging, spectral Doppler, color Doppler, and power Doppler as needed of all accessible portions of each vessel. Bilateral testing is considered an integral part of a complete examination. Limited examinations for reoccurring indications may be performed as noted.  +----------+---------------+----------+-----------+--------+ RIGHT TCD Right VM (cm/s)Depth (cm)PulsatilityComment  +----------+---------------+----------+-----------+--------+ MCA             70          5.20      1.30    130 cm/s +----------+---------------+----------+-----------+--------+ ACA             -38                   1.14              +----------+---------------+----------+-----------+--------+ Term ICA        48                    1.31             +----------+---------------+----------+-----------+--------+ PCA P1          35                    1.33             +----------+---------------+----------+-----------+--------+ Opthalmic       15                    2.04             +----------+---------------+----------+-----------+--------+ ICA siphon      38                    1.14             +----------+---------------+----------+-----------+--------+ Vertebral       -33                   0.98             +----------+---------------+----------+-----------+--------+ Distal ICA      36                    1.22             +----------+---------------+----------+-----------+--------+  +----------+--------------+----------+-----------+-------+ LEFT TCD  Left VM (cm/s)Depth (cm)PulsatilityComment +----------+--------------+----------+-----------+-------+ MCA  63                   1.21            +----------+--------------+----------+-----------+-------+ ACA            -34                   1.17            +----------+--------------+----------+-----------+-------+ Term ICA        45                   1.42            +----------+--------------+----------+-----------+-------+ PCA P1          30                   1.22            +----------+--------------+----------+-----------+-------+ Opthalmic       14                   2.17            +----------+--------------+----------+-----------+-------+ ICA siphon      33                   1.11            +----------+--------------+----------+-----------+-------+ Vertebral      -34                   1.25            +----------+--------------+----------+-----------+-------+ Distal ICA      25                   1.14            +----------+--------------+----------+-----------+-------+  +------------+-------+-------+              VM cm/sComment +------------+-------+-------+ Prox Basilar  -41   0.98   +------------+-------+-------+ Dist Basilar  -52   0.96   +------------+-------+-------+ +----------------------+----+ Right Lindegaard Ratio1.94 +----------------------+----+ +---------------------+----+ Left Lindegaard Ratio2.52 +---------------------+----+  Summary: This was a normal transcranial Doppler study, with normal flow direction and velocity of all identified vessels of the anterior and posterior circulations, with no evidence of stenosis, vasospasm or occlusion. There was no evidence of intracranial disease.  *See table(s) above for TCD measurements and observations.  Diagnosing physician: Eather Popp MD Electronically signed by Eather Popp MD on 11/25/2023 at 1:18:57 PM.    Final     Assessment/Plan: I have sent ventricular CSF forThis is a 63 year old woman with nontraumatic subarachnoid hemorrhage, angio negative -Plan for angio next week - Continue EVD open to drain at 15 cm - I have sent for CSF for culturing - Continue Nimotop    Bridges Andrea Ned 11/27/2023, 10:22 AM

## 2023-11-27 NOTE — Progress Notes (Signed)
 Date and time results received: 11/27/23 1520  Test: WBC, CSF Critical Value: 56  Name of Provider Notified: Leita Gaskins, DO  Orders Received? Or Actions Taken?:  Patient already on abx for CSF coverage. Neurosurgery paged as well for critical result notification.

## 2023-11-28 ENCOUNTER — Inpatient Hospital Stay (HOSPITAL_COMMUNITY)

## 2023-11-28 DIAGNOSIS — N390 Urinary tract infection, site not specified: Secondary | ICD-10-CM

## 2023-11-28 DIAGNOSIS — G441 Vascular headache, not elsewhere classified: Secondary | ICD-10-CM | POA: Diagnosis not present

## 2023-11-28 DIAGNOSIS — B9620 Unspecified Escherichia coli [E. coli] as the cause of diseases classified elsewhere: Secondary | ICD-10-CM

## 2023-11-28 DIAGNOSIS — R1310 Dysphagia, unspecified: Secondary | ICD-10-CM | POA: Diagnosis not present

## 2023-11-28 DIAGNOSIS — I609 Nontraumatic subarachnoid hemorrhage, unspecified: Secondary | ICD-10-CM | POA: Diagnosis not present

## 2023-11-28 DIAGNOSIS — R739 Hyperglycemia, unspecified: Secondary | ICD-10-CM | POA: Diagnosis not present

## 2023-11-28 LAB — RENAL FUNCTION PANEL
Albumin: 3.1 g/dL — ABNORMAL LOW (ref 3.5–5.0)
Anion gap: 11 (ref 5–15)
BUN: 34 mg/dL — ABNORMAL HIGH (ref 8–23)
CO2: 23 mmol/L (ref 22–32)
Calcium: 9.4 mg/dL (ref 8.9–10.3)
Chloride: 102 mmol/L (ref 98–111)
Creatinine, Ser: 0.62 mg/dL (ref 0.44–1.00)
GFR, Estimated: >60 mL/min (ref >60)
Glucose, Bld: 133 mg/dL — ABNORMAL HIGH (ref 70–99)
Phosphorus: 3 mg/dL (ref 2.5–4.6)
Potassium: 3.9 mmol/L (ref 3.5–5.1)
Sodium: 136 mmol/L (ref 135–145)

## 2023-11-28 LAB — GLUCOSE, CAPILLARY
Glucose-Capillary: 163 mg/dL — ABNORMAL HIGH (ref 70–99)
Glucose-Capillary: 125 mg/dL — ABNORMAL HIGH (ref 70–99)
Glucose-Capillary: 129 mg/dL — ABNORMAL HIGH (ref 70–99)
Glucose-Capillary: 129 mg/dL — ABNORMAL HIGH (ref 70–99)
Glucose-Capillary: 129 mg/dL — ABNORMAL HIGH (ref 70–99)

## 2023-11-28 LAB — URINE CULTURE: Culture: >=100000 — AB

## 2023-11-28 LAB — PATHOLOGIST SMEAR REVIEW

## 2023-11-28 MED ORDER — LIDOCAINE HCL (PF) 1 % IJ SOLN: 5.0000 mL | Freq: Once | INTRAMUSCULAR | Status: AC

## 2023-11-28 MED ORDER — LIDOCAINE HCL (PF) 1 % IJ SOLN
INTRAMUSCULAR | Status: AC
  Administered 2023-11-28: 5 mL
  Filled 2023-11-28: qty 10

## 2023-11-28 MED ORDER — SODIUM CHLORIDE (PF) 0.9 % IJ SOLN
INTRAMUSCULAR | Status: AC
  Filled 2023-11-28: qty 10

## 2023-11-28 MED ORDER — CEFAZOLIN SODIUM-DEXTROSE 2-4 GM/100ML-% IV SOLN
2.0000 g | Freq: Three times a day (TID) | INTRAVENOUS | Status: AC
  Administered 2023-11-28 – 2023-12-02 (×13): 2 g via INTRAVENOUS
  Filled 2023-11-28 (×13): qty 100

## 2023-11-28 MED ORDER — MORPHINE SULFATE (PF) 2 MG/ML IV SOLN
INTRAVENOUS | Status: AC
  Administered 2023-11-28: 2 mg via INTRAVENOUS
  Filled 2023-11-28: qty 1

## 2023-11-28 MED ORDER — MORPHINE SULFATE (PF) 2 MG/ML IV SOLN: 2.0000 mg | Freq: Once | INTRAVENOUS | Status: AC

## 2023-11-28 MED ORDER — SENNA 8.6 MG PO TABS
1.0000 | ORAL_TABLET | Freq: Every day | ORAL | Status: DC
  Administered 2023-11-29: 8.6 mg via ORAL
  Filled 2023-11-28: qty 1

## 2023-11-28 NOTE — Progress Notes (Signed)
 Patient ID: Andrea Bridges, female   DOB: Apr 07, 1961, 63 y.o.   MRN: [id number] Called to see patient with regard to nonfunctional ventricular drain.  Attempts to irrigate the drain did not facilitate drainage so replaced the catheter.  Patient is neurologically and was awake arousable slightly somnolent with severe headache.

## 2023-11-28 NOTE — Procedures (Signed)
 Preoperative diagnosis: Subarachnoid hemorrhage and hydrocephalus.  Postoperative diagnosis: Same.  Procedure: Replacement of right frontal ventricular catheter due to catheter becoming nonfunctional.  Procedure note.  63 year old female admitted underwent ventricular catheter placement a week ago catheter had to be flushed yesterday however couple hours ago stopped functioning patient had increased headache and lethargy CT scan showed good position of the tip of the catheter and some mild to moderate hydrocephalus.  Attempts to flush the catheter did not work to adequately facilitate drainage and patient was draining fairly regularly at 10 to 15 cc an hour.  So recommended and replaced the catheter through the same bur hole and tunneled it and with good flow first-pass under pressure procedure went without complication.  Drain was set up incisions were sewed and the drain was sewn in place at the end the procedure all needle count sponge counts were correct.

## 2023-11-28 NOTE — Progress Notes (Signed)
 Speech Language Pathology Treatment: Cognitive-Linguistic  Patient Details Name: Andrea Bridges MRN: [id number] DOB: 02-Dec-1960 Today's Date: 11/28/2023 Time: 8552-8541 SLP Time Calculation (min) (ACUTE ONLY): 11 min  Assessment / Plan / Recommendation Clinical Impression  SLP conducted skilled therapy session targeting cognitive goals. Patient in room, partially reclined with family/friends present. SLP targeted sustained attention and orientation skills. Patient lethargic throughout and benefiting from max assist to sustain attention for bouts of 30 seconds. During attentive periods, targeted use of external orientation aids present on walls. Patient required max assist to locate orientation aid. Aid was repositioned closer to patient's view and during attentive periods, patient was tasked with identifying day of the week and date. Patient unable to state day of the week from field of 2 choices despite information present on visual aid, and date not stated by patient throughout despite max cues. Suspect lethargy is impacting performance most starkly. Patient was left in room with call bell in reach and alarm set. SLP will continue to target goals per plan of care.      HPI HPI: Patient is a 63 y.o. female with PMH: PCOS, smoking/vaping, obsity, skin CA, anxiety. She presented to the hospital on 11/20/23 with acute onset headache. Imaging revealed large SAH. Ventricular drain placed by neurosurgery. She was intubated 7/13 and extubated 7/14 to supplemental oxygen via [place] and currently on RA.      SLP Plan  Continue with current plan of care          Recommendations                   Rehab consult Oral care BID;Patient independent with oral care   Frequent or constant Supervision/Assistance Cognitive communication deficit (M58.158)     Continue with current plan of care   Rosina Downy, M.A., CCC-SLP   Kristain Hu A Ramar Nobrega  11/28/2023, 3:06 PM

## 2023-11-28 NOTE — Progress Notes (Signed)
  NEUROSURGERY PROGRESS NOTE   Had EVD replaced overnight. mild HA currently. Family reports she has been more talkative/interactive today.  EXAM:  BP 131/69   Pulse 74   Temp (!) 97.5 F (36.4 C) (Oral)   Resp 19   Ht 5' 7 (1.702 m)   Wt 84.2 kg   LMP  (LMP Unknown)   SpO2 96%   BMI 29.07 kg/m   Awake, alert, oriented Speech fluent CN grossly intact  Good strength throughout Right groin site soft EVD in place, patent open at 15cmH2O.   TCD: Date POD PCO2 HCT BP   MCA ACA PCA OPHT SIPH VERT Basilar  7/14 GC         Right  Left   50  51          25  22   14  17   22   *   *  *   *       7/16 JH       39 117/65  Right  Left    60   62    -18   -40    37   39    19   14    20    43    -54   -67    -51       7/18  JH     38.9   113/65 Right  Left    70   63    -38   -34    35   30    15   14     38   33    -33   -34    -52       7/21 JH      38.9  133/62  Right  Left    132   137    -31   -20    39   54    23   17    54   34    -63   -81    -59        IMPRESSION:  63 y.o. female SAH d# 8, CTA negative, unable to complete catheter angiogram initially. Remains neurologically intact, increased bilateral MCA TCD velocities but no clinical correlate.  PLAN: - Cont EVD drainage, will attempt to wean tomorrow - Cont to monitor in ICU - Nimotop  60q4 to complete 21d - Cont PT/OT/SLP tomorrow - Plan on repeat angio tomorrow   Gerldine Maizes, MD [facility]

## 2023-11-28 NOTE — Progress Notes (Signed)
 NAME:  Andrea Bridges, MRN:  [id number], DOB:  08/28/1960, LOS: 8 ADMISSION DATE:  11/20/2023, CONSULTATION DATE:  11/20/23 REFERRING MD:  EDP, CHIEF COMPLAINT:  headache   History of Present Illness:  63 year old woman w/ hx of smoking, obesity presenting with headache.  Workup reveals large subarachnoid hemorrhage.  CTA neg but V4 of both vertebrals and distal basilar artery taper off concerning for possible early vasospasm and making full eval of this area difficult.  NSGY coming in to consider drain.  Intermittent purposeful movement and occasionally says ow to nursing.  PCCM to admit.  Significant Hospital Events: Including procedures, antibiotic start and stop dates in addition to other pertinent events   11/20/23 admit, EVD placement 7/13 CT head>>  1. Diffuse basilar subarachnoid hemorrhage with intraventricular extension and early ventriculomegaly, greatest along the left cerebellopontine angle cistern, possibly indicating the site of origin. 7/14 EEG>>This study is suggestive of moderate to severe diffuse encephalopathy, nonspecific etiology but likely related to sedation. No seizures or epileptiform discharges were seen throughout the recording. Extubated. 7/15 limited angiogram- no aneurysms, AVMs, or fistulas. CVC& aline removed. 7/21 new EVD placed overnight early AM due to malfunction of old catheter   Interim History / Subjective:  CSF gram stain negative. Currently on Vanc/Cefepime   Objective    Blood pressure (!) 155/81, pulse 72, temperature 98.1 F (36.7 C), temperature source Oral, resp. rate 16, height 5' 7 (1.702 m), weight 84.2 kg, SpO2 96%.        Intake/Output Summary (Last 24 hours) at 11/28/2023 0802 Last data filed at 11/28/2023 0700 Gross per 24 hour  Intake 2192.85 ml  Output 1684 ml  Net 508.85 ml   Filed Weights   11/25/23 0704 11/26/23 0600 11/28/23 0500  Weight: 85.9 kg 84.7 kg 84.2 kg    Examination:  General: Ill-appearing woman lying in bed no  acute distress HENT: EVD with serosanguineous output (new EVD placed overnight) Lungs: Breathing comfortably on room air.  CTAB. Cardiovascular: RRR, no M/R/G Abdomen: Soft, nontender, nondistended Extremities: No peripheral edema Neuro: A&O x 3, globally weak but no true deficits. Follows commands.  Blood culture: NGTD Urine culture: E.coli EEG: mild diffuse encephalopathy   Assessment and Plan   Nontraumatic SAH with hydrocephalus s/p EVD 7/13 with replacement 7/21 early AM due to malfunctioning old EVD Headache - Appreciate neurosurgery's management. - CSF gram stain from 7/20 negative, culture pending but NGTD. Reasonable to deescalate to Ancef  - Plan for repeat angiography this week - Continue EVD to 15 cm H2O -Scheduled Tylenol  3, Topamax .  Oxycodone  as needed - Continue nimotop  - Serial neuroexams.  Hyperglycemia- controlled - SSI as needed - Goal blood glucose 140 and 180  Fevers associated with encephalopathy - CSF gram stain 7/20 negative and cultures NGTD. Fevers likely 2/2 UTI. E.coli UTI (resistant to Ampicillin, intermediate to Unasyn). - De-escalate broad spectrum abx from Vanc/Cefepime  to Ancef . - Follow CSF culture through completion.  Dysphagia, poor PO intake -Continue core track and tube feeds - Appreciate SLP's assistance  Constipation - resolved -con't Colace, Senna, Miralax   Urinary retention -Continue Flomax  and bethanechol  for today, can consider d/c 7/22  H/o tobaco abuse -Has been counseled on the importance of quitting - Nicotine  patch - Referral for lung cancer screening recommended at discharge.  Nausea and vomiting -Zofran  and Phenergan  as needed    Best Practice (right click and Reselect all SmartList Selections daily)   Diet/type: tubefeeds DVT prophylaxis lovenox  Pressure ulcer(s): N/A GI prophylaxis: PPI Lines:  N/A EVD Foley:  removal ordered  Code Status:  full code Last date of multidisciplinary goals of care  discussion [daughter/exhusband updated]   Critical care time:  30 min    [name], Andrea Bridges For pager details, please see AMION or use Epic chat  After 1900, please call [facility] for cross coverage needs 11/28/2023, 8:15 AM

## 2023-11-28 NOTE — Progress Notes (Signed)
 EVD with anywhere from 9 to 19 mLs of output per hour for duration of the shift. At 0100, output decreased to 3mL, and at 0200 output was 0 with no pulsation visible in the tubing. At this time, patient complaining of 10/10 headache and nausea. Patient also weaker in all four extremities compared to previous exams throughout the night.  Dr. Onetha paged and gave a verbal order for a stat head CT which was obtained. Dr. Onetha notified of completion of exam and then came to bedside. See his note for further details/procedure notes.

## 2023-11-28 NOTE — Progress Notes (Signed)
 PT Cancellation Note  Patient Details Name: Andrea Bridges MRN: [id number] DOB: 08-09-1960   Cancelled Treatment:    Reason Eval/Treat Not Completed: Medical issues which prohibited therapy - EVD replaced overnight, neurosurgery wants PT to hold until tomorrow.   Natale Barba S, PT DPT Acute Rehabilitation Services Secure Chat Preferred  Office [phone]    Johana [name] 11/28/2023, 12:06 PM

## 2023-11-28 NOTE — Progress Notes (Signed)
 Transcranial Doppler   Date POD PCO2 HCT BP   MCA ACA PCA OPHT SIPH VERT Basilar  7/14 GC         Right  Left   50  51          25  22   14  17   22   *   *  *   *       7/16 JH       39 117/65  Right  Left    60   62    -18   -40    37   39    19   14    20    43    -54   -67    -51       7/18  JH     38.9   113/65 Right  Left    70   63    -38   -34    35   30    15   14     38   33    -33   -34    -52       7/21 JH      38.9  133/62  Right  Left    132   137    -31   -20    39   54    23   17    54   34    -63   -81    -59                   Right  Left                                                               Right  Left                                                               Right  Left                                                       MCA = Middle Cerebral Artery      OPHT = Opthalmic Artery     BASILAR = Basilar Artery   ACA = Anterior Cerebral Artery     SIPH = Carotid Siphon PCA = Posterior Cerebral Artery   VERT = Verterbral Artery                    Normal MCA = 62+\-12 ACA = 50+\-12 PCA = 42+\-23    Right Lindegaard ratio : 4.71 Left Lindegaard ratio : 5.27    Results can be found under chart review under CV PROC. 11/28/2023 5:21 PM Margarit Minshall RVT, RDMS

## 2023-11-29 ENCOUNTER — Inpatient Hospital Stay (HOSPITAL_COMMUNITY)

## 2023-11-29 DIAGNOSIS — R739 Hyperglycemia, unspecified: Secondary | ICD-10-CM | POA: Diagnosis not present

## 2023-11-29 DIAGNOSIS — I609 Nontraumatic subarachnoid hemorrhage, unspecified: Secondary | ICD-10-CM | POA: Diagnosis not present

## 2023-11-29 DIAGNOSIS — G441 Vascular headache, not elsewhere classified: Secondary | ICD-10-CM | POA: Diagnosis not present

## 2023-11-29 DIAGNOSIS — R1310 Dysphagia, unspecified: Secondary | ICD-10-CM | POA: Diagnosis not present

## 2023-11-29 HISTORY — PX: IR ANGIO INTRA EXTRACRAN SEL INTERNAL CAROTID BILAT MOD SED: IMG5363

## 2023-11-29 HISTORY — PX: IR US GUIDE VASC ACCESS RIGHT: IMG2390

## 2023-11-29 HISTORY — PX: IR ANGIO VERTEBRAL SEL VERTEBRAL BILAT MOD SED: IMG5369

## 2023-11-29 LAB — GLUCOSE, CAPILLARY
Glucose-Capillary: 109 mg/dL — ABNORMAL HIGH (ref 70–99)
Glucose-Capillary: 114 mg/dL — ABNORMAL HIGH (ref 70–99)
Glucose-Capillary: 114 mg/dL — ABNORMAL HIGH (ref 70–99)
Glucose-Capillary: 118 mg/dL — ABNORMAL HIGH (ref 70–99)
Glucose-Capillary: 127 mg/dL — ABNORMAL HIGH (ref 70–99)
Glucose-Capillary: 129 mg/dL — ABNORMAL HIGH (ref 70–99)
Glucose-Capillary: 135 mg/dL — ABNORMAL HIGH (ref 70–99)

## 2023-11-29 LAB — RENAL FUNCTION PANEL
Albumin: 3.3 g/dL — ABNORMAL LOW (ref 3.5–5.0)
Anion gap: 11 (ref 5–15)
BUN: 38 mg/dL — ABNORMAL HIGH (ref 8–23)
CO2: 20 mmol/L — ABNORMAL LOW (ref 22–32)
Calcium: 9.7 mg/dL (ref 8.9–10.3)
Chloride: 101 mmol/L (ref 98–111)
Creatinine, Ser: 0.55 mg/dL (ref 0.44–1.00)
GFR, Estimated: >60 mL/min (ref >60)
Glucose, Bld: 115 mg/dL — ABNORMAL HIGH (ref 70–99)
Phosphorus: 2.9 mg/dL (ref 2.5–4.6)
Potassium: 4 mmol/L (ref 3.5–5.1)
Sodium: 132 mmol/L — ABNORMAL LOW (ref 135–145)

## 2023-11-29 LAB — CBC
HCT: 39.4 % (ref 36.0–46.0)
Hemoglobin: 13.5 g/dL (ref 12.0–15.0)
MCH: 27.6 pg (ref 26.0–34.0)
MCHC: 34.3 g/dL (ref 30.0–36.0)
MCV: 80.4 fL (ref 80.0–100.0)
Platelets: 275 K/uL (ref 150–400)
RBC: 4.9 MIL/uL (ref 3.87–5.11)
RDW: 13.9 % (ref 11.5–15.5)
WBC: 10 K/uL (ref 4.0–10.5)
nRBC: 0 % (ref 0.0–0.2)

## 2023-11-29 MED ORDER — POLYETHYLENE GLYCOL 3350 17 G PO PACK
17.0000 g | PACK | Freq: Every day | ORAL | Status: DC
  Administered 2023-11-30: 17 g via ORAL
  Filled 2023-11-29: qty 1

## 2023-11-29 MED ORDER — TOPIRAMATE 25 MG PO TABS
25.0000 mg | ORAL_TABLET | Freq: Two times a day (BID) | ORAL | Status: DC
  Administered 2023-11-29 – 2023-11-30 (×2): 25 mg via ORAL
  Filled 2023-11-29 (×2): qty 1

## 2023-11-29 MED ORDER — POLYETHYLENE GLYCOL 3350 17 G PO PACK: 17.0000 g | PACK | Freq: Every day | ORAL | Status: DC

## 2023-11-29 MED ORDER — SENNA 8.6 MG PO TABS: 1.0000 | ORAL_TABLET | Freq: Every day | ORAL | Status: DC

## 2023-11-29 MED ORDER — BETHANECHOL CHLORIDE 10 MG PO TABS: 10.0000 mg | ORAL_TABLET | Freq: Three times a day (TID) | ORAL | Status: DC

## 2023-11-29 MED ORDER — ACETAMINOPHEN-CODEINE 300-30 MG PO TABS
2.0000 | ORAL_TABLET | Freq: Four times a day (QID) | ORAL | Status: DC
  Administered 2023-11-29 (×2): 2 via ORAL
  Administered 2023-11-30: 1 via ORAL
  Filled 2023-11-29 (×3): qty 2

## 2023-11-29 MED ORDER — LIDOCAINE HCL (PF) 1 % IJ SOLN
INTRAMUSCULAR | Status: AC
  Filled 2023-11-29: qty 30

## 2023-11-29 MED ORDER — BETHANECHOL CHLORIDE 10 MG PO TABS
10.0000 mg | ORAL_TABLET | Freq: Three times a day (TID) | ORAL | Status: DC
  Administered 2023-11-29 – 2023-11-30 (×3): 10 mg via ORAL
  Filled 2023-11-29 (×3): qty 1

## 2023-11-29 MED ORDER — SODIUM CHLORIDE 0.9 % IV SOLN: INTRAVENOUS | Status: DC

## 2023-11-29 MED ORDER — MIDAZOLAM HCL 2 MG/2ML IJ SOLN
INTRAMUSCULAR | Status: AC
  Filled 2023-11-29: qty 2

## 2023-11-29 MED ORDER — FENTANYL CITRATE (PF) 100 MCG/2ML IJ SOLN
INTRAMUSCULAR | Status: AC | PRN
  Administered 2023-11-29: 25 ug via INTRAVENOUS

## 2023-11-29 MED ORDER — SENNA 8.6 MG PO TABS
1.0000 | ORAL_TABLET | Freq: Every day | ORAL | Status: DC
  Administered 2023-11-30: 8.6 mg via ORAL
  Filled 2023-11-29: qty 1

## 2023-11-29 MED ORDER — LIDOCAINE HCL (PF) 1 % IJ SOLN
30.0000 mL | Freq: Once | INTRAMUSCULAR | Status: AC
  Administered 2023-11-29: 10 mL via INTRADERMAL

## 2023-11-29 MED ORDER — FENTANYL CITRATE (PF) 100 MCG/2ML IJ SOLN
INTRAMUSCULAR | Status: AC
Start: 2023-11-29 — End: 2023-11-29
  Filled 2023-11-29: qty 2

## 2023-11-29 MED ORDER — HEPARIN SODIUM (PORCINE) 1000 UNIT/ML IJ SOLN
INTRAMUSCULAR | Status: AC
  Filled 2023-11-29: qty 10

## 2023-11-29 MED ORDER — IOHEXOL 300 MG/ML  SOLN
100.0000 mL | Freq: Once | INTRAMUSCULAR | Status: AC | PRN
  Administered 2023-11-29: 70 mL via INTRA_ARTERIAL

## 2023-11-29 NOTE — TOC Progression Note (Signed)
 Transition of Care [facility]) - Progression Note    Patient Details  Name: Andrea Bridges MRN: [id number] Date of Birth: [birth year]  [facility] CM/SW Contact  Marval Gell, RN Phone Number: 11/29/2023, 3:21 PM  Clinical Narrative:     Patient admitted with HA and SAH. Last PT eval 7/17. Rpt angio today. Continues w EVD. ICM will continue to follow.   Expected Discharge Plan: Home/Self Care Barriers to Discharge: Continued Medical Work up  Expected Discharge Plan and Services   Discharge Planning Services: CM Consult Post Acute Care Choice: NA Living arrangements for the past 2 months: Single Family Home                 DME Arranged: N/A         HH Arranged: NA           Social Determinants of Health (SDOH) Interventions SDOH Screenings   Food Insecurity: Patient Unable To Answer (11/20/2023)  Housing: Patient Unable To Answer (11/20/2023)  Transportation Needs: Patient Unable To Answer (11/20/2023)  Utilities: Patient Unable To Answer (11/20/2023)  Depression (PHQ2-9): Low Risk  (09/22/2023)  Social Connections: Unknown (09/22/2021)   Received from [facility]  Tobacco Use: High Risk (10/19/2023)    Readmission Risk Interventions    11/21/2023    4:34 PM  Readmission Risk Prevention Plan  Post Dischage Appt Complete  Medication Screening Complete  Transportation Screening Complete

## 2023-11-29 NOTE — Progress Notes (Signed)
 Patient returned from IR at 12pm. Alert and oriented. Vascular site checked, level 0, pulses palpable.

## 2023-11-29 NOTE — Progress Notes (Signed)
  NEUROSURGERY PROGRESS NOTE   Doing well this am, no significant complaints. No issues overnight.  EXAM:  BP 121/72 (BP Location: Right Arm)   Pulse 71   Temp 98.3 F (36.8 C) (Oral)   Resp 14   Ht 5' 7 (1.702 m)   Wt 84.7 kg   LMP  (LMP Unknown)   SpO2 95%   BMI 29.25 kg/m   Awake, alert, oriented Speech fluent CN grossly intact  Good strength throughout, no drift Right groin site soft EVD in place, patent open at 10cmH2O.   TCD: Date POD PCO2 HCT BP   MCA ACA PCA OPHT SIPH VERT Basilar  7/14 GC         Right  Left   50  51          25  22   14  17   22   *   *  *   *       7/16 JH       39 117/65  Right  Left    60   62    -18   -40    37   39    19   14    20    43    -54   -67    -51       7/18  JH     38.9   113/65 Right  Left    70   63    -38   -34    35   30    15   14     38   33    -33   -34    -52       7/21 JH      38.9  133/62  Right  Left    132   137    -31   -20    39   54    23   17    54   34    -63   -81    -59        IMPRESSION:  63 y.o. female SAH d# 9, CTA negative, unable to complete catheter angiogram initially. Remains neurologically intact, no clinical spasm.  PLAN: - Plan on diagnostic angiogram today - Cont EVD drainage, will raise after angio pending results - Cont to monitor in ICU - Nimotop  60q4 to complete 21d - Cont PT/OT/SLP, OOB after angiogram bedrest period   Gerldine Maizes, MD [facility]

## 2023-11-29 NOTE — Progress Notes (Signed)
 Patient ID: Andrea Bridges, female   DOB: May 23, 1960, 63 y.o.   MRN: [id number] Relayed angiogram findings to husband. No aneurysms, vascular malformations identified.  Ventric working well.

## 2023-11-29 NOTE — Progress Notes (Signed)
 SLP Cancellation Note  Patient Details Name: Roiza Wiedel MRN: [id number] DOB: 1961-02-13   Cancelled treatment:       Reason Eval/Treat Not Completed: Other (comment) (Patient to have cerebral angiogram in IR today. SLP will continue to follow.)   Norleen IVAR Blase, MA, CCC-SLP Speech Therapy

## 2023-11-29 NOTE — Progress Notes (Signed)
 OT Cancellation Note  Patient Details Name: Andrea Bridges MRN: [id number] DOB: 1960/11/09   Cancelled Treatment:    Reason Eval/Treat Not Completed: Patient at procedure or test/ unavailable.  On bedrest post IR procedure.    Adlene Adduci D Harris Kistler 11/29/2023, 1:16 PM 11/29/2023  RP, OTR/L  Acute Rehabilitation Services  Office:  [phone]

## 2023-11-29 NOTE — Progress Notes (Signed)
 PT Cancellation Note  Patient Details Name: Andrea Bridges MRN: [id number] DOB: Feb 25, 1961   Cancelled Treatment:    Reason Eval/Treat Not Completed: Medical issues which prohibited therapy;Patient at procedure or test/unavailable - just got back from IR, presently on bedrest post-procedure. PT to check back at a later time.   Adison Reifsteck S, PT DPT Acute Rehabilitation Services Secure Chat Preferred  Office [phone]    [name] 11/29/2023, 11:59 AM

## 2023-11-29 NOTE — Op Note (Signed)
 ENDOVASCULAR NEUROSURGERY OPERATIVE NOTE   PROCEDURE: Diagnostic Cerebral Angiogram   SURGEON:   Dr. Gerldine Maizes, MD  HISTORY:   The patient is a 62 y.o. yo female admitted to the hospital with sudden onset of headache and initial CT scan demonstrating diffuse subarachnoid hemorrhage.  Her initial CT angiogram was negative.  We attempted diagnostic cerebral angiogram upon admission which was aborted as the patient was somewhat agitated and removed her femoral sheath mid procedure.  She has been monitored in the intensive care unit with largely stable neurologic condition.  She presents today for follow-up diagnostic cerebral angiogram.  APPROACH:   The technical aspects of the procedure as well as its potential risks and benefits were reviewed with the patient and her family. These risks included but were not limited bleeding, infection, allergic reaction, damage to organs/vital structures, stroke, non-diagnostic procedure, and the catastrophic outcomes of heart attack, coma, and death. With an understanding of these risks, informed consent was obtained and witnessed.    The patient was placed in the supine position on the angiography table and the skin of right groin prepped in the usual sterile fashion. The procedure was performed under local anesthesia (1%-solution of bicarbonate-bufferred Lidoacaine) and conscious sedation with Versed  and fentanyl  monitored by the in-suite nurse and myself, including non-invasive blood pressure and continuous pulse oxymetry.    Access to the right common femoral artery was obtained under ultrasound guidance using a micropuncture needle.  This allowed direct visualization of the micropuncture needle into the lumen of the right common femoral artery.  A short 5 French sheath was placed using standard Seldinger technique.  HEPARIN :  0 Units total.    CONTRAST AGENT:  See IR records   FLUOROSCOPY TIME:  See IR records    CATHETER(S) AND WIRE(S):     5-French JB-1 glidecatheter   0.035" glidewire    VESSELS CATHETERIZED:   Right internal carotid   Left internal carotid   Right vertebral   Left vertebral   Right common femoral  VESSELS STUDIED:   Right internal carotid, head Left internal carotid, head Left vertebral Right vertebral Right femoral  PROCEDURAL NARRATIVE:   A 5-Fr JB-1 terumo glide catheter was advanced over a 0.035 glidewire into the aortic arch. The above vessels were then sequentially catheterized and cervical/cerebral angiograms taken. After review of images, the catheter was removed without incident.    INTERPRETATION:   Right internal carotid, head:   Injection reveals the presence of a widely patent ICA, M1, and A1 segments and their branches. No aneurysms, arteriovenous malformations, or high flow fistulas are visualized.  There is mild to moderate vasospasm involving primarily the proximal segments of the middle and anterior cerebral arteries.  There is no flow mentation.  The parenchymal and venous phases are unremarkable. The venous sinuses are widely patent.    Left internal carotid, head:   Injection reveals the presence of a widely patent ICA, A1, and M1 segments and their branches. No aneurysms, arteriovenous malformations, or high flow fistulas are visualized.  Similar to the contralateral side, there is mild to moderate vasospasm involving the proximal segments of the middle cerebral artery.  There is no flow limitation.  The parenchymal and venous phases are unremarkable. The venous sinuses are widely patent.    Right vertebral:   Injection reveals the presence of a widely patent vertebral artery. This leads to a widely patent basilar artery that terminates in left P1, while the right P1 is largely hypoplastic. The basilar apex  is normal. No aneurysms, arteriovenous malformations, or high flow fistulas are visualized.  No significant vasospasm of the posterior circulation is noted.  The parenchymal and  venous phases are normal. The venous sinuses are patent.    Right vertebral:    Normal vessel. No PICA aneurysm. See basilar description above.    Right femoral:    Normal vessel. No significant atherosclerotic disease. Arterial sheath in adequate position.   DISPOSITION:  Upon completion of the study, the femoral sheath was removed and hemostasis obtained using a 5-Fr Exoseal closure device. Good proximal and distal lower extremity pulses were documented upon achievement of hemostasis. The procedure was well tolerated and no early complications were observed.  Patient was then transferred back to the neurointensive care unit for further care.  IMPRESSION:  1.  No intracranial aneurysms, arteriovenous malformations, or high flow fistulas are identified on this follow-up diagnostic cerebral angiogram as a source of subarachnoid hemorrhage. 2.  There is mild to moderate vasospasm involving the proximal segments of the anterior and middle cerebral arteries bilaterally, without any flow limitation.    Gerldine Maizes, MD [facility]

## 2023-11-29 NOTE — Progress Notes (Signed)
 NAME:  Andrea Bridges, MRN:  [id number], DOB:  28-Nov-1960, LOS: 9 ADMISSION DATE:  11/20/2023, CONSULTATION DATE:  11/20/23 REFERRING MD:  EDP, CHIEF COMPLAINT:  headache   History of Present Illness:  63 year old woman w/ hx of smoking, obesity presenting with headache.  Workup reveals large subarachnoid hemorrhage.  CTA neg but V4 of both vertebrals and distal basilar artery taper off concerning for possible early vasospasm and making full eval of this area difficult.  NSGY coming in to consider drain.  Intermittent purposeful movement and occasionally says ow to nursing.  PCCM to admit.  Significant Hospital Events: Including procedures, antibiotic start and stop dates in addition to other pertinent events   11/20/23 admit, EVD placement 7/13 CT head>>  1. Diffuse basilar subarachnoid hemorrhage with intraventricular extension and early ventriculomegaly, greatest along the left cerebellopontine angle cistern, possibly indicating the site of origin. 7/14 EEG>>This study is suggestive of moderate to severe diffuse encephalopathy, nonspecific etiology but likely related to sedation. No seizures or epileptiform discharges were seen throughout the recording. Extubated. 7/15 limited angiogram- no aneurysms, AVMs, or fistulas. CVC& aline removed. 7/21 new EVD placed overnight early AM due to malfunction of old catheter. TCD's with increased bilateral MCA velocities but no clinical correlate. 7/22 repeat angiogram planned  Interim History / Subjective:  NAEON. Feels a bit improved today.  Objective    Blood pressure 121/72, pulse 71, temperature 98.3 F (36.8 C), temperature source Oral, resp. rate 14, height 5' 7 (1.702 m), weight 84.7 kg, SpO2 95%.        Intake/Output Summary (Last 24 hours) at 11/29/2023 0819 Last data filed at 11/29/2023 0800 Gross per 24 hour  Intake 1368.85 ml  Output 2181 ml  Net -812.15 ml   Filed Weights   11/26/23 0600 11/28/23 0500 11/29/23 0500  Weight: 84.7 kg  84.2 kg 84.7 kg    Examination:  General: Ill-appearing woman lying in bed no acute distress, family at bedside HENT: EVD at 10 with serosanguineous output (new EVD placed overnight) Lungs: Breathing comfortably on room air.  CTAB. Cardiovascular: RRR, no M/R/G Abdomen: Soft, nontender, nondistended Extremities: No peripheral edema Neuro: A&O x 3, globally weak but no true deficits. Follows commands.  Blood culture: NGTD Urine culture: E.coli EEG: mild diffuse encephalopathy   Assessment and Plan   Nontraumatic SAH with hydrocephalus s/p EVD 7/13 with replacement 7/21 early AM due to malfunctioning old EVD Headache - Appreciate neurosurgery's management. - CSF gram stain from 7/20 negative, culture pending but NGTD. Abx deescalated 7/21 to Ancef  - Plan for repeat angiography today 7/22 - Continue EVD to 10 cm H2O per NSGY -Scheduled Tylenol  3 (wean off as able), Topamax .  Oxycodone  as needed - Continue nimotop  - Serial neuroexams.  Hyperglycemia- controlled - SSI as needed - Goal blood glucose 140 and 180  Fevers associated with encephalopathy - CSF gram stain 7/20 negative and cultures NGTD. Fevers likely 2/2 UTI. E.coli UTI (resistant to Ampicillin, intermediate to Unasyn). - Continue Ancef  x 7 days total as she has foley catheter for retention (stop date 7/25) - Follow CSF culture through completion.  Dysphagia, poor PO intake -Continue core track and tube feeds - Appreciate SLP's assistance  Constipation - resolved -con't Senna, Miralax   Urinary retention - has required foley placement, could be medication related (on Tylenol  #3) -Continue Flomax  - Stop bethanachol  H/o tobaco abuse - Has been counseled on the importance of quitting - Nicotine  patch - Referral for lung cancer screening recommended at  discharge  Nausea and vomiting -Zofran  and Phenergan  as needed    Best Practice (right click and Reselect all SmartList Selections daily)   Diet/type:  tubefeeds DVT prophylaxis lovenox  Pressure ulcer(s): N/A GI prophylaxis: PPI Lines: N/A EVD Foley:  removal ordered  Code Status:  full code Last date of multidisciplinary goals of care discussion [daughter/exhusband updated]   Critical care time:  30 min    Sammi Gore, PA - [facility] For pager details, please see AMION or use Epic chat  After 1900, please call ELINK for cross coverage needs 11/29/2023, 8:19 AM

## 2023-11-30 ENCOUNTER — Inpatient Hospital Stay (HOSPITAL_COMMUNITY)

## 2023-11-30 ENCOUNTER — Encounter (HOSPITAL_COMMUNITY): Payer: Self-pay

## 2023-11-30 DIAGNOSIS — I609 Nontraumatic subarachnoid hemorrhage, unspecified: Secondary | ICD-10-CM

## 2023-11-30 DIAGNOSIS — E871 Hypo-osmolality and hyponatremia: Secondary | ICD-10-CM | POA: Diagnosis not present

## 2023-11-30 DIAGNOSIS — G9340 Encephalopathy, unspecified: Secondary | ICD-10-CM | POA: Diagnosis not present

## 2023-11-30 DIAGNOSIS — R739 Hyperglycemia, unspecified: Secondary | ICD-10-CM | POA: Diagnosis not present

## 2023-11-30 LAB — BASIC METABOLIC PANEL WITH GFR
Anion gap: 11 (ref 5–15)
Anion gap: 9 (ref 5–15)
BUN: 37 mg/dL — ABNORMAL HIGH (ref 8–23)
BUN: 34 mg/dL — ABNORMAL HIGH (ref 8–23)
CO2: 17 mmol/L — ABNORMAL LOW (ref 22–32)
CO2: 19 mmol/L — ABNORMAL LOW (ref 22–32)
Calcium: 9.4 mg/dL (ref 8.9–10.3)
Calcium: 8.6 mg/dL — ABNORMAL LOW (ref 8.9–10.3)
Chloride: 104 mmol/L (ref 98–111)
Chloride: 107 mmol/L (ref 98–111)
Creatinine, Ser: 0.58 mg/dL (ref 0.44–1.00)
Creatinine, Ser: 0.51 mg/dL (ref 0.44–1.00)
GFR, Estimated: >60 mL/min (ref >60)
GFR, Estimated: >60 mL/min (ref >60)
Glucose, Bld: 138 mg/dL — ABNORMAL HIGH (ref 70–99)
Glucose, Bld: 129 mg/dL — ABNORMAL HIGH (ref 70–99)
Potassium: 4.1 mmol/L (ref 3.5–5.1)
Potassium: 3.5 mmol/L (ref 3.5–5.1)
Sodium: 132 mmol/L — ABNORMAL LOW (ref 135–145)
Sodium: 135 mmol/L (ref 135–145)

## 2023-11-30 LAB — CSF CULTURE W GRAM STAIN
Culture: NO GROWTH
Gram Stain: NONE SEEN

## 2023-11-30 LAB — CBC
HCT: 34.8 % — ABNORMAL LOW (ref 36.0–46.0)
Hemoglobin: 12.4 g/dL (ref 12.0–15.0)
MCH: 28.3 pg (ref 26.0–34.0)
MCHC: 35.6 g/dL (ref 30.0–36.0)
MCV: 79.5 fL — ABNORMAL LOW (ref 80.0–100.0)
Platelets: 301 K/uL (ref 150–400)
RBC: 4.38 MIL/uL (ref 3.87–5.11)
RDW: 13.8 % (ref 11.5–15.5)
WBC: 12.2 K/uL — ABNORMAL HIGH (ref 4.0–10.5)
nRBC: 0 % (ref 0.0–0.2)

## 2023-11-30 LAB — RENAL FUNCTION PANEL
Albumin: 3 g/dL — ABNORMAL LOW (ref 3.5–5.0)
Anion gap: 11 (ref 5–15)
BUN: 34 mg/dL — ABNORMAL HIGH (ref 8–23)
CO2: 20 mmol/L — ABNORMAL LOW (ref 22–32)
Calcium: 9.1 mg/dL (ref 8.9–10.3)
Chloride: 101 mmol/L (ref 98–111)
Creatinine, Ser: 0.48 mg/dL (ref 0.44–1.00)
GFR, Estimated: >60 mL/min (ref >60)
Glucose, Bld: 123 mg/dL — ABNORMAL HIGH (ref 70–99)
Phosphorus: 3 mg/dL (ref 2.5–4.6)
Potassium: 4 mmol/L (ref 3.5–5.1)
Sodium: 132 mmol/L — ABNORMAL LOW (ref 135–145)

## 2023-11-30 LAB — OSMOLALITY: Osmolality: 289 mosm/kg (ref 275–295)

## 2023-11-30 LAB — SODIUM, URINE, RANDOM: Sodium, Ur: 80 mmol/L

## 2023-11-30 LAB — URIC ACID: Uric Acid, Serum: 1.9 mg/dL — ABNORMAL LOW (ref 2.5–7.1)

## 2023-11-30 LAB — GLUCOSE, CAPILLARY
Glucose-Capillary: 129 mg/dL — ABNORMAL HIGH (ref 70–99)
Glucose-Capillary: 133 mg/dL — ABNORMAL HIGH (ref 70–99)
Glucose-Capillary: 124 mg/dL — ABNORMAL HIGH (ref 70–99)
Glucose-Capillary: 130 mg/dL — ABNORMAL HIGH (ref 70–99)
Glucose-Capillary: 113 mg/dL — ABNORMAL HIGH (ref 70–99)

## 2023-11-30 LAB — OSMOLALITY, URINE: Osmolality, Ur: 740 mosm/kg (ref 300–900)

## 2023-11-30 LAB — TRIGLYCERIDES: Triglycerides: 82 mg/dL (ref <150)

## 2023-11-30 LAB — MAGNESIUM: Magnesium: 2.2 mg/dL (ref 1.7–2.4)

## 2023-11-30 MED ORDER — SENNA 8.6 MG PO TABS
1.0000 | ORAL_TABLET | Freq: Every day | ORAL | Status: DC
  Administered 2023-12-01: 8.6 mg
  Filled 2023-11-30: qty 1

## 2023-11-30 MED ORDER — TAMSULOSIN HCL 0.4 MG PO CAPS
0.4000 mg | ORAL_CAPSULE | Freq: Every day | ORAL | Status: DC
  Administered 2023-12-02 – 2023-12-08 (×7): 0.4 mg via ORAL
  Filled 2023-11-30 (×8): qty 1

## 2023-11-30 MED ORDER — SODIUM CHLORIDE 0.9 % IV SOLN: INTRAVENOUS | Status: DC

## 2023-11-30 MED ORDER — TOPIRAMATE 25 MG PO TABS
25.0000 mg | ORAL_TABLET | Freq: Two times a day (BID) | ORAL | Status: DC
  Administered 2023-11-30 – 2023-12-05 (×10): 25 mg
  Filled 2023-11-30 (×10): qty 1

## 2023-11-30 MED ORDER — BETHANECHOL CHLORIDE 10 MG PO TABS
10.0000 mg | ORAL_TABLET | Freq: Three times a day (TID) | ORAL | Status: DC
  Administered 2023-11-30 – 2023-12-05 (×15): 10 mg
  Filled 2023-11-30 (×15): qty 1

## 2023-11-30 MED ORDER — POLYETHYLENE GLYCOL 3350 17 G PO PACK
17.0000 g | PACK | Freq: Every day | ORAL | Status: DC
  Administered 2023-12-01: 17 g
  Filled 2023-11-30: qty 1

## 2023-11-30 NOTE — Progress Notes (Signed)
 Transcranial Doppler   Date POD PCO2 HCT BP   MCA ACA PCA OPHT SIPH VERT Basilar  7/14 GC         Right  Left   50  51          25  22   14  17   22   *   *  *   *       7/16 JH       39 117/65  Right  Left    60   62    -18   -40    37   39    19   14    20    43    -54   -67    -51       7/18  JH     38.9   113/65 Right  Left    70   63    -38   -34    35   30    15   14     38   33    -33   -34    -52       7/21 JH      38.9  133/62  Right  Left    132   137    -31   -20    39   54    23   17    54   34    -63   -81    -59       7/23,rs            Right  Left    149   118    -94   -77    52   64    28   20    66   61    -56   -94    -46                 Right  Left                                                               Right  Left                                                       MCA = Middle Cerebral Artery      OPHT = Opthalmic Artery     BASILAR = Basilar Artery   ACA = Anterior Cerebral Artery     SIPH = Carotid Siphon PCA = Posterior Cerebral Artery   VERT = Verterbral Artery                    Normal MCA = 62+\-12 ACA = 50+\-12 PCA = 42+\-23    Right Lindegaard ratio : 3.5 Left Lindegaard ratio : 3.5   11/30/2023  5:03 PM Denica Web, Ricka BIRCH

## 2023-11-30 NOTE — Progress Notes (Signed)
 Physical Therapy Treatment Patient Details Name: Andrea Bridges MRN: [id number] DOB: March 16, 1961 Today's Date: 11/30/2023   History of Present Illness Pt is a 63 y.o. female who presented 11/20/23 with a headache. Upon arrival to ED, she became nauseated and unresponsive. 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 S/p R frontal ventricular catheter placement 7/13. ETT 7/13-7/14. Cortrak placed 7/14. S/p diagnostic cerebral angiogram 7/15, but prematurely aborted due to pt agitation. PMH: anxiety, skin cancer    PT Comments  Pt very lethargic, hindering ability to participate in therapy. She required max assist bed mobility, and max assist to maintain balance EOB. Pt falling asleep without continuous multimodal cues. Nonverbal during session. Held her hand up to relay '5' when asked her pain level. Pt supine in bed at end of session. Updated d/c recs to further therapy in inpatient setting > 3 hours/day.     If plan is discharge home, recommend the following: Assistance with cooking/housework;Direct supervision/assist for medications management;Direct supervision/assist for financial management;Assist for transportation;Help with stairs or ramp for entrance;Supervision due to cognitive status;Two people to help with bathing/dressing/bathroom;Two people to help with walking and/or transfers   Can travel by private vehicle        Equipment Recommendations  Other (comment) (TBD)    Recommendations for Other Services Rehab consult     Precautions / Restrictions Precautions Precautions: Fall;Other (comment) Recall of Precautions/Restrictions: Impaired Precaution/Restrictions Comments: SBP < 160; EVD (clamp prior to mobility)     Mobility  Bed Mobility Overal bed mobility: Needs Assistance Bed Mobility: Supine to Sit, Rolling, Sit to Sidelying Rolling: Max assist   Supine to sit: Max assist   Sit to sidelying: Max assist General bed mobility comments: assist for all aspects  of mobility    Transfers                   General transfer comment: unable to safely progress beyond EOB    Ambulation/Gait                   Stairs             Wheelchair Mobility     Tilt Bed    Modified Rankin (Stroke Patients Only) Modified Rankin (Stroke Patients Only) Pre-Morbid Rankin Score: No symptoms Modified Rankin: Moderately severe disability     Balance Overall balance assessment: Needs assistance Sitting-balance support: Feet supported, Bilateral upper extremity supported Sitting balance-Leahy Scale: Poor Sitting balance - Comments: max assist to maintain sitting balance EOB Postural control: Posterior lean                                  Communication Communication Communication: Impaired Factors Affecting Communication: Difficulty expressing self (Pt nonverbal during session. Related pain rating by holding up 5 fingers.)  Cognition Arousal: Obtunded Behavior During Therapy: Flat affect   PT - Cognitive impairments: Difficult to assess Difficult to assess due to: Level of arousal                     PT - Cognition Comments: Delayed processing noted. Following commands: Impaired Following commands impaired: Follows one step commands inconsistently, Follows one step commands with increased time    Cueing Cueing Techniques: Verbal cues  Exercises      General Comments General comments (skin integrity, edema, etc.): VSS on RA      Pertinent Vitals/Pain Pain Assessment Pain Assessment: 0-10 Pain  Score: 5  Pain Location: head Pain Descriptors / Indicators: Headache Pain Intervention(s): Monitored during session    Home Living                          Prior Function            PT Goals (current goals can now be found in the care plan section) Acute Rehab PT Goals Patient Stated Goal: not stated Progress towards PT goals: Not progressing toward goals - comment (lethargy)     Frequency    Min 1X/week      PT Plan      Co-evaluation              AM-PAC PT 6 Clicks Mobility   Outcome Measure  Help needed turning from your back to your side while in a flat bed without using bedrails?: A Lot Help needed moving from lying on your back to sitting on the side of a flat bed without using bedrails?: Total Help needed moving to and from a bed to a chair (including a wheelchair)?: Total Help needed standing up from a chair using your arms (e.g., wheelchair or bedside chair)?: Total Help needed to walk in hospital room?: Total Help needed climbing 3-5 steps with a railing? : Total 6 Click Score: 7    End of Session   Activity Tolerance: Patient limited by lethargy Patient left: in bed;with call bell/phone within reach Nurse Communication: Mobility status PT Visit Diagnosis: Unsteadiness on feet (R26.81);Other abnormalities of gait and mobility (R26.89);Pain     Time: 8955-8890 PT Time Calculation (min) (ACUTE ONLY): 25 min  Charges:    $Therapeutic Activity: 23-37 mins PT General Charges $$ ACUTE PT VISIT: 1 Visit                     Sari MATSU., PT  Office # [phone]    [name] 11/30/2023, 12:16 PM

## 2023-11-30 NOTE — Progress Notes (Signed)
 NAME:  Andrea Bridges, MRN:  [id number], DOB:  1960-12-04, LOS: 10 ADMISSION DATE:  11/20/2023, CONSULTATION DATE:  11/20/23 REFERRING MD:  EDP, CHIEF COMPLAINT:  headache   History of Present Illness:  63 year old woman w/ hx of smoking, obesity presenting with headache.  Workup reveals large subarachnoid hemorrhage.  CTA neg but V4 of both vertebrals and distal basilar artery taper off concerning for possible early vasospasm and making full eval of this area difficult.  NSGY coming in to consider drain.  Intermittent purposeful movement and occasionally says ow to nursing.  PCCM to admit.  Significant Hospital Events: Including procedures, antibiotic start and stop dates in addition to other pertinent events   11/20/23 admit, EVD placement 7/13 CT head>>  1. Diffuse basilar subarachnoid hemorrhage with intraventricular extension and early ventriculomegaly, greatest along the left cerebellopontine angle cistern, possibly indicating the site of origin. 7/14 EEG>>This study is suggestive of moderate to severe diffuse encephalopathy, nonspecific etiology but likely related to sedation. No seizures or epileptiform discharges were seen throughout the recording. Extubated. 7/15 limited angiogram- no aneurysms, AVMs, or fistulas. CVC& aline removed. 7/21 new EVD placed overnight early AM due to malfunction of old catheter. TCD's with increased bilateral MCA velocities but no clinical correlate. 7/22 repeat angiogram planned  Interim History / Subjective:  NAEON. Feels a bit improved today.  Objective    Blood pressure 122/69, pulse (!) 56, temperature 98.2 F (36.8 C), temperature source Axillary, resp. rate 14, height 5' 7 (1.702 m), weight 85.5 kg, SpO2 97%.        Intake/Output Summary (Last 24 hours) at 11/30/2023 9187 Last data filed at 11/30/2023 0700 Gross per 24 hour  Intake 1963.5 ml  Output 2092 ml  Net -128.5 ml   Filed Weights   11/28/23 0500 11/29/23 0500 11/30/23 0500  Weight:  84.2 kg 84.7 kg 85.5 kg    Examination:  General: acute on chronically ill woman, sitting up in ICU bed in NAd HEENT: Normocephalic, EVD in place now 20 from 10 (serosanguineous output), PERRLA intact, Pink MM CV: s1,s2, RRR, no MRG, No JVD  pulm: clear, diminished, no distress on vent  Abs: bs active, soft  Extremities: no edema, no deformity, moves all extremities on command  Skin: no rash Neuro: Rass 0, alert/oriented x3, follows commands GU: foley in place, yellow urine    Blood culture: NGTD  x 4 days  Urine culture: E.coli EEG: mild diffuse encephalopathy CSF culture: still pending, NGTD x 2 days   Resolved:   Constipation    Assessment and Plan   Nontraumatic SAH with hydrocephalus s/p EVD 7/13 with replacement 7/21 early AM due to malfunctioning old EVD Headache 7/22 Cerebral angiogram completed-no intracranial aneurysms/malformations/high flow fistulas noted.  Mild to moderate vasospasm involving proximal segments of anterior/middle cerebral arteries B/L P: Neurosurgery assisting, appreciate management CSF gram stain-no growth to date Continue EVD at 20 cm H2O per neurosurgery, monitor drain output Continue Topamax  and Tylenol , discontinue opiates due to waxing/waning neuroexam Continue Nimotop , discussed with neurosurgery no need for triple H therapy for vasospasms at this time since they are mild to moderate Continue serial neuroexams Continue neuroprotective measures Continue to monitor hyponatremia- see below   Hyperglycemia- controlled P: Continue SSI Continue CBG goal 140-180  Fevers associated with encephalopathy - CSF gram stain 7/20 negative and cultures NGTD. Fevers likely 2/2 UTI. E.coli UTI (resistant to Ampicillin, intermediate to Unasyn). P: Continue Ancef  for 7 days total, as Foley remains for retention-hopefully able to  remove Foley tomorrow on 7/24, antibiotics stop date 7/25 Continue CHF culture through completion  Hyponatremia Sodium 132  7/22 started on normal saline at 50 mL an hour Still remains hyponatremic at 132 P: Sending urine sodium and urine osmolality-follow-up with Increase effusion from 50 mL to 75 mL an hour Trend sodium per daily BMP  Dysphagia, poor PO intake P: Continue core track and tube feeds Continues to speech therapy, appreciate assistance   Urinary retention - has required foley placement, could be medication related  P:  Stopping opiates today on 7/23 Continue Flomax  and bethanechol  for now will attempt to move Foley tomorrow on 7/24  H/o tobaco abuse P: Continue counseling in regards to importance of quitting Continue nicotine  patch Will need referral for lung cancer screening-recommendation at discharge  Nausea and vomiting -Appears to be controlled P: Continue Zofran  and Phenergan  as needed  Best Practice (right click and Reselect all SmartList Selections daily)   Diet/type: tubefeeds DVT prophylaxis lovenox  Pressure ulcer(s): N/A GI prophylaxis: PPI Lines: N/A EVD Foley:  removal ordered  Code Status:  full code Last date of multidisciplinary goals of care discussion [family updated at bedside on 7/23   Critical care time:  40 min   Christian Breely Panik AGACNP-BC   [facility] 11/30/2023, 10:37 AM  Please see [email or web address] for pager details.  From 7A-7P if no response, please call [phone]. After hours, please call ELink [phone].

## 2023-11-30 NOTE — Progress Notes (Signed)
 Speech Language Pathology Treatment: Cognitive-Linguistic  Patient Details Name: Andrea Bridges MRN: [id number] DOB: 07-29-1960 Today's Date: 11/30/2023 Time: 8681-8670 SLP Time Calculation (min) (ACUTE ONLY): 11 min  Assessment / Plan / Recommendation Clinical Impression  SLP visited patient to target cognitive goals. Patient extremely lethargic today with few verbalizations and inconsistently following commands per RN and family. Patient with no attempts to verbalize name despite maxA. Patient followed 1 single step command to stick her tongue out, though no attempts to follow remainder. Patient gestured headache to SLP by placing hands on head. SLP educated patients family member on targeting L attention per recent ST session. SLP encouraged family to sit on the L side and encourage use of calendar for orientation (located on the L in her room). Patients family verbalized understanding. At the end of the session, patient mouthed bye to SLP. Recommend continuation of ST services targeting cognition as patients participation and alertness improves.     HPI HPI: Patient is a 63 y.o. female with PMH: PCOS, smoking/vaping, obsity, skin CA, anxiety. She presented to the hospital on 11/20/23 with acute onset headache. Imaging revealed large SAH. Ventricular drain placed by neurosurgery. She was intubated 7/13 and extubated 7/14 to supplemental oxygen via [place] and currently on RA.      SLP Plan  Continue with current plan of care          Frequent or constant Supervision/Assistance Cognitive communication deficit (R41.841)     Continue with current plan of care      [name] M.A., CCC-SLP 11/30/2023, 1:33 PM

## 2023-11-30 NOTE — Progress Notes (Addendum)
 Nutrition Follow-up  DOCUMENTATION CODES:  Not applicable  INTERVENTION:  Tube feeds via Cortrak: Osmolite 1.5 @ 55 mL/hr (1320 mL per day) 60 mL ProSource TF20 - Daily Regimen provides 2060 calories, 103 gm protein, and 1006 mL free water daily.   As intake improves will adjust enteral nutrition   Ensure Plus High Protein po BID, each supplement provides 350 kcal and 20 grams of protein.- pt prefers chocolate   NUTRITION DIAGNOSIS:  Inadequate oral intake related to inability to eat as evidenced by NPO status. Ongoing.   GOAL:  Patient will meet greater than or equal to 90% of their needs Met with TF at goal and diet advancement  MONITOR:  Vent status, Labs, Weight trends, I & O's  REASON FOR ASSESSMENT:  Ventilator   ASSESSMENT:  63 y.o. female presented to the ED with headache. PMH includes anxiety and PCOS. Pt admitted with SAH.  Pt discussed during ICU rounds and with RN and MD.  Pt seen with friends at bedside. Pt not eating due to periods of lethargy. Pt did not answer any questions this am during visit.  Plan to continue current TF regimen and as pt symptoms improve expect intake to improve and will adjust tube feeding.   7/13 - Admitted; Op s/p R frontal ventricular catheter placement 7/14 - Extubated; Cortrak placed 7/16 - diet advanced to Regular   Medications reviewed and include: SSI every 4 hours, nimotop , protonix , miralax , senokot    Labs reviewed:  CBG's: 194-133  ICP: 183 ml   Diet Order:   Diet Order             Diet regular Room service appropriate? Yes; Fluid consistency: Thin  Diet effective now                  EDUCATION NEEDS: Not appropriate for education at this time  Skin:  Skin Assessment: Reviewed RN Assessment  Last BM:  7/23 small; type 7 with FMS no vol charted  Height:  Ht Readings from Last 1 Encounters:  11/21/23 5' 7 (1.702 m)   Weight:  Wt Readings from Last 1 Encounters:  11/30/23 85.5 kg   Ideal Body  Weight:  61.4 kg  BMI:  Body mass index is 29.52 kg/m.  Estimated Nutritional Needs:  Kcal:  1900-2100 Protein:  95-115 grams Fluid:  >/= 1.9 L  Katerine Morua P., RD, LDN, CNSC See AMiON for contact information

## 2023-11-30 NOTE — Plan of Care (Signed)
  Problem: Health Behavior/Discharge Planning: Goal: Goals will be collaboratively established with patient/family Outcome: Progressing   Problem: Self-Care: Goal: Ability to communicate needs accurately will improve Outcome: Progressing   Problem: Nutrition: Goal: Risk of aspiration will decrease Outcome: Progressing   Problem: Metabolic: Goal: Ability to maintain appropriate glucose levels will improve Outcome: Progressing   Problem: Nutritional: Goal: Maintenance of adequate nutrition will improve Outcome: Progressing   Problem: Clinical Measurements: Goal: Ability to maintain clinical measurements within normal limits will improve Outcome: Progressing

## 2023-11-30 NOTE — Progress Notes (Signed)
 OT Cancellation Note  Patient Details Name: Andrea Bridges MRN: [id number] DOB: 02-Feb-1961   Cancelled Treatment:    Reason Eval/Treat Not Completed: Fatigue/lethargy limiting ability to participate  Marika Mahaffy K, OTD, OTR/L SecureChat Preferred Acute Rehab [phone]   Laneta MARLA Pereyra 11/30/2023, 3:26 PM

## 2023-11-30 NOTE — Progress Notes (Signed)
 EVD draining well from 2000-2200, ranging from 13-72mL, serosanguinous drainage.  2300- EVD with 8mL output  0000- EVD drainage only 1mL  0100- EVD output minimal, EVD dropped to assess flow, and slow flow resumed, with only a total of 6mL (5mL of which was when it was dropped).  0200- EVD with 0mL output.  Neuro status unchanged, patient easy to arouse, oriented x 3 to 4, pupils equal, reactive, brisk, moving all extremities, headache same as prior to EVD not draining.   0200 [facility], [place], [place] notified.   No new orders at this time, continue to monitor neuro status. Per Johnanna, [place] [facility] MD was considering clamp trial today.

## 2023-11-30 NOTE — Progress Notes (Signed)
 Inpatient Rehab Admissions Coordinator:   Per updated PT recommendations pt was screened for CIR by Reche Lowers, PT, DPT.  Note decline in function since last therapy session with medical complications in between.  EVD still in, which we can not manage on CIR, so we will follow for d/c of that and see how she does at that time.   Reche Lowers, PT, DPT Admissions Coordinator [phone] 11/30/23  12:37 PM

## 2023-11-30 NOTE — Progress Notes (Signed)
  NEUROSURGERY PROGRESS NOTE   Doing well this am, no significant complaints. No issues overnight, decreased EVD output which has resumed.  EXAM:  BP 129/75 (BP Location: Right Arm)   Pulse 63   Temp 98.2 F (36.8 C) (Axillary)   Resp 16   Ht 5' 7 (1.702 m)   Wt 85.5 kg   LMP  (LMP Unknown)   SpO2 99%   BMI 29.52 kg/m   Awake, alert, oriented Speech fluent CN grossly intact  Good strength throughout, no drift Right groin site soft EVD in place, patent open at 10cmH2O.   TCD: Date POD PCO2 HCT BP   MCA ACA PCA OPHT SIPH VERT Basilar  7/14 GC         Right  Left   50  51          25  22   14  17   22   *   *  *   *       7/16 JH       39 117/65  Right  Left    60   62    -18   -40    37   39    19   14    20    43    -54   -67    -51       7/18  JH     38.9   113/65 Right  Left    70   63    -38   -34    35   30    15   14     38   33    -33   -34    -52       7/21 JH      38.9  133/62  Right  Left    132   137    -31   -20    39   54    23   17    54   34    -63   -81    -59        IMPRESSION:  63 y.o. female SAH d# 10, CTA negative and angio yesterday negative. Remains neurologically intact, with mild-mod angiographic spasm correlating well with TCD.  PLAN: - Cont EVD, raised to today - Cont to monitor in ICU - Nimotop  60q4 to complete 21d - Cont PT/OT/SLP - TCD today  Gerldine Maizes, MD [facility]

## 2023-12-01 DIAGNOSIS — G9340 Encephalopathy, unspecified: Secondary | ICD-10-CM | POA: Diagnosis not present

## 2023-12-01 DIAGNOSIS — E871 Hypo-osmolality and hyponatremia: Secondary | ICD-10-CM | POA: Diagnosis not present

## 2023-12-01 DIAGNOSIS — R739 Hyperglycemia, unspecified: Secondary | ICD-10-CM | POA: Diagnosis not present

## 2023-12-01 DIAGNOSIS — I609 Nontraumatic subarachnoid hemorrhage, unspecified: Secondary | ICD-10-CM | POA: Diagnosis not present

## 2023-12-01 LAB — RENAL FUNCTION PANEL
Albumin: 2.8 g/dL — ABNORMAL LOW (ref 3.5–5.0)
Anion gap: 13 (ref 5–15)
BUN: 27 mg/dL — ABNORMAL HIGH (ref 8–23)
CO2: 19 mmol/L — ABNORMAL LOW (ref 22–32)
Calcium: 8.8 mg/dL — ABNORMAL LOW (ref 8.9–10.3)
Chloride: 102 mmol/L (ref 98–111)
Creatinine, Ser: 0.57 mg/dL (ref 0.44–1.00)
GFR, Estimated: >60 mL/min (ref >60)
Glucose, Bld: 138 mg/dL — ABNORMAL HIGH (ref 70–99)
Phosphorus: 2.6 mg/dL (ref 2.5–4.6)
Potassium: 3.6 mmol/L (ref 3.5–5.1)
Sodium: 134 mmol/L — ABNORMAL LOW (ref 135–145)

## 2023-12-01 LAB — CULTURE, BLOOD (ROUTINE X 2)
Culture: NO GROWTH
Culture: NO GROWTH

## 2023-12-01 LAB — CBC
HCT: 34.4 % — ABNORMAL LOW (ref 36.0–46.0)
Hemoglobin: 11.8 g/dL — ABNORMAL LOW (ref 12.0–15.0)
MCH: 27.5 pg (ref 26.0–34.0)
MCHC: 34.3 g/dL (ref 30.0–36.0)
MCV: 80.2 fL (ref 80.0–100.0)
Platelets: 317 K/uL (ref 150–400)
RBC: 4.29 MIL/uL (ref 3.87–5.11)
RDW: 13.8 % (ref 11.5–15.5)
WBC: 10.8 K/uL — ABNORMAL HIGH (ref 4.0–10.5)
nRBC: 0 % (ref 0.0–0.2)

## 2023-12-01 LAB — GLUCOSE, CAPILLARY
Glucose-Capillary: 117 mg/dL — ABNORMAL HIGH (ref 70–99)
Glucose-Capillary: 121 mg/dL — ABNORMAL HIGH (ref 70–99)
Glucose-Capillary: 121 mg/dL — ABNORMAL HIGH (ref 70–99)
Glucose-Capillary: 122 mg/dL — ABNORMAL HIGH (ref 70–99)
Glucose-Capillary: 123 mg/dL — ABNORMAL HIGH (ref 70–99)
Glucose-Capillary: 126 mg/dL — ABNORMAL HIGH (ref 70–99)
Glucose-Capillary: 127 mg/dL — ABNORMAL HIGH (ref 70–99)

## 2023-12-01 LAB — SODIUM
Sodium: 133 mmol/L — ABNORMAL LOW (ref 135–145)
Sodium: 134 mmol/L — ABNORMAL LOW (ref 135–145)

## 2023-12-01 LAB — MAGNESIUM: Magnesium: 2.2 mg/dL (ref 1.7–2.4)

## 2023-12-01 MED ORDER — ORAL CARE MOUTH RINSE
15.0000 mL | OROMUCOSAL | Status: DC
  Administered 2023-12-01 – 2023-12-09 (×42): 15 mL via OROMUCOSAL

## 2023-12-01 MED ORDER — SODIUM CHLORIDE 1 G PO TABS
2.0000 g | ORAL_TABLET | Freq: Three times a day (TID) | ORAL | Status: DC
  Administered 2023-12-01 – 2023-12-05 (×12): 2 g
  Filled 2023-12-01 (×12): qty 2

## 2023-12-01 NOTE — Plan of Care (Signed)
  Problem: Coping: Goal: Will identify appropriate support needs Outcome: Progressing   Problem: Health Behavior/Discharge Planning: Goal: Goals will be collaboratively established with patient/family Outcome: Progressing   Problem: Self-Care: Goal: Ability to communicate needs accurately will improve Outcome: Progressing   Problem: Metabolic: Goal: Ability to maintain appropriate glucose levels will improve Outcome: Progressing   Problem: Clinical Measurements: Goal: Ability to maintain clinical measurements within normal limits will improve Outcome: Progressing   Problem: Pain Managment: Goal: General experience of comfort will improve and/or be controlled Outcome: Progressing

## 2023-12-01 NOTE — Progress Notes (Signed)
 NAME:  Andrea Bridges, MRN:  [id number], DOB:  21-Dec-1960, LOS: 11 ADMISSION DATE:  11/20/2023, CONSULTATION DATE:  11/20/23 REFERRING MD:  EDP, CHIEF COMPLAINT:  headache   History of Present Illness:  64 year old woman w/ hx of smoking, obesity presenting with headache.  Workup reveals large subarachnoid hemorrhage.  CTA neg but V4 of both vertebrals and distal basilar artery taper off concerning for possible early vasospasm and making full eval of this area difficult.  NSGY coming in to consider drain.  Intermittent purposeful movement and occasionally says ow to nursing.  PCCM to admit.  Significant Hospital Events: Including procedures, antibiotic start and stop dates in addition to other pertinent events   11/20/23 admit, EVD placement 7/13 CT head>>  1. Diffuse basilar subarachnoid hemorrhage with intraventricular extension and early ventriculomegaly, greatest along the left cerebellopontine angle cistern, possibly indicating the site of origin. 7/14 EEG>>This study is suggestive of moderate to severe diffuse encephalopathy, nonspecific etiology but likely related to sedation. No seizures or epileptiform discharges were seen throughout the recording. Extubated. 7/15 limited angiogram- no aneurysms, AVMs, or fistulas. CVC& aline removed. 7/21 new EVD placed overnight early AM due to malfunction of old catheter. TCD's with increased bilateral MCA velocities but no clinical correlate. 7/22 repeat angiogram planned 7/23 angiogram completed-no intracranial aneurysms/malformations/high flow fistulas noted.  Mild to moderate vasospasm  7/24 improved neuroexam on this morning as well as pain from headache, still remains off getting opiates  Interim History / Subjective:  No significant events overnight Sodium 134 Headache pain has improved, not requiring opiates Neurostatus also slightly improved  Objective    Blood pressure (!) 140/67, pulse (!) 55, temperature 98.2 F (36.8 C), temperature  source Oral, resp. rate 19, height 5' 7 (1.702 m), weight 87.7 kg, SpO2 96%.        Intake/Output Summary (Last 24 hours) at 12/01/2023 1013 Last data filed at 12/01/2023 1000 Gross per 24 hour  Intake 3771.9 ml  Output 2630 ml  Net 1141.9 ml   Filed Weights   11/29/23 0500 11/30/23 0500 12/01/23 0500  Weight: 84.7 kg 85.5 kg 87.7 kg    Examination:  General: Acute on chronically -year-old female, lying in ICU bed, NAD HEENT: Normocephalic, EVD in place at 20 serosanguineous output, surgical site clean/intact, PERRLA intact, pink MM CV: S1, S2 RRR, no MRG, no JVD Pulm: Clear, diminished, no distress ABS: Bowel sounds active, core track in place Extremities: No edema, no deformity, moves all extremities on command Neuro: RASS 0 to -1, alert and oriented x 4, follows commands GU: Foley in place, yellow urine  Blood culture: NGTD  x 4 days  Urine culture: E.coli EEG: mild diffuse encephalopathy CSF culture: still pending, NGTD x 3 days   Resolved:   Constipation    Assessment and Plan   Nontraumatic SAH with hydrocephalus s/p EVD 7/13 with replacement 7/21 early AM due to malfunctioning old EVD Headache 7/22 Cerebral angiogram completed-no intracranial aneurysms/malformations/high flow fistulas noted.  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 Fevers likely 2/2 UTI. E.coli UTI (resistant to Ampicillin, intermediate to Unasyn). 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 P: Repeat sodiums every 6 hour if continues to drop will need to check more frequently Will discuss with neurosurgery if they would like nephrology consult with possibility of 3% hypertonic saline to be started Recommend avoiding hypotonic fluids, restrict free water at this time   Dysphagia, poor PO intake P: Continue core track and tube feeds Continue speech therapy, appreciate assistance  Urinary retention - has required foley placement, could be medication related  P:  Continue Flomax  and bethanechol  for now Remove Foley today on 7/24 Patient has been not made urine within the next 4 hours for removal, will need to be BladderScan to assess for retention  H/o tobaco abuse P: Continue counseling regarding importance of quitting Continue nicotine  patch Will need referral for lung cancer screening-recommendation at discharge  Nausea and vomiting -Appears to be controlled P: Continue Zofran  and Phenergan  as needed Please notify of nausea/vomiting returns   Best Practice (right click and Reselect all SmartList Selections daily)    Diet/type: tubefeeds DVT prophylaxis lovenox  Pressure ulcer(s): N/A GI prophylaxis: PPI Lines: N/A EVD Foley:  removal ordered  Code Status:  full code Last date of multidisciplinary goals of care discussion [family updated at bedside on 7/23   Critical care time:  40 min   Christian Chaniah Cisse AGACNP-BC   [facility] 12/01/2023, 10:13 AM  Please see [email or web address] for pager details.  From 7A-7P if no response, please call [phone]. After hours, please call ELink [phone].

## 2023-12-01 NOTE — Progress Notes (Signed)
  NEUROSURGERY PROGRESS NOTE   Doing well this am, no significant complaints. No issues overnight  EXAM:  BP 121/72 (BP Location: Right Arm)   Pulse (!) 56   Temp 98.5 F (36.9 C) (Oral)   Resp 20   Ht 5' 7 (1.702 m)   Wt 87.7 kg   LMP  (LMP Unknown)   SpO2 97%   BMI 30.28 kg/m   Awake, alert Speech fluent CN grossly intact  Good strength throughout, no drift Right groin site soft EVD in place, patent open at   TCD: Date POD PCO2 HCT BP   MCA ACA PCA OPHT SIPH VERT Basilar  7/14 GC         Right  Left   50  51          25  22   14  17   22   *   *  *   *       7/16 JH       39 117/65  Right  Left    60   62    -18   -40    37   39    19   14    20    43    -54   -67    -51       7/18  JH     38.9   113/65 Right  Left    70   63    -38   -34    35   30    15   14     38   33    -33   -34    -52       7/21 JH      38.9  133/62  Right  Left    132   137    -31   -20    39   54    23   17    54   34    -63   -81    -59        IMPRESSION:  63 y.o. female SAH d# 11, CTA negative and angio negative. HCP, tolerated EVD @ Remains neurologically intact, with mild-mod angiographic spasm correlating well with TCD. Hyponatremia - Minimal oral intake so fluid restriction unlikely to be of benefit.  PLAN: - EVD clamped today - Cont to monitor in ICU - Nimotop  60q4 to complete 21d - Cont PT/OT/SLP - TCD tomorrow - Can add salt tabs, cont to monitor Na. If unresponsive, may need mildly hypertonic IVF.  Gerldine Maizes, MD [facility]

## 2023-12-02 ENCOUNTER — Other Ambulatory Visit: Payer: Self-pay

## 2023-12-02 ENCOUNTER — Encounter (HOSPITAL_COMMUNITY): Payer: Self-pay | Admitting: Internal Medicine

## 2023-12-02 ENCOUNTER — Inpatient Hospital Stay (HOSPITAL_COMMUNITY)

## 2023-12-02 DIAGNOSIS — R1310 Dysphagia, unspecified: Secondary | ICD-10-CM | POA: Diagnosis not present

## 2023-12-02 DIAGNOSIS — E871 Hypo-osmolality and hyponatremia: Secondary | ICD-10-CM | POA: Diagnosis not present

## 2023-12-02 DIAGNOSIS — I609 Nontraumatic subarachnoid hemorrhage, unspecified: Secondary | ICD-10-CM

## 2023-12-02 DIAGNOSIS — R339 Retention of urine, unspecified: Secondary | ICD-10-CM | POA: Diagnosis not present

## 2023-12-02 LAB — CBC
HCT: 35.1 % — ABNORMAL LOW (ref 36.0–46.0)
Hemoglobin: 12.2 g/dL (ref 12.0–15.0)
MCH: 27.9 pg (ref 26.0–34.0)
MCHC: 34.8 g/dL (ref 30.0–36.0)
MCV: 80.1 fL (ref 80.0–100.0)
Platelets: 342 K/uL (ref 150–400)
RBC: 4.38 MIL/uL (ref 3.87–5.11)
RDW: 13.8 % (ref 11.5–15.5)
WBC: 12.5 K/uL — ABNORMAL HIGH (ref 4.0–10.5)
nRBC: 0 % (ref 0.0–0.2)

## 2023-12-02 LAB — RENAL FUNCTION PANEL
Albumin: 2.8 g/dL — ABNORMAL LOW (ref 3.5–5.0)
Anion gap: 10 (ref 5–15)
BUN: 23 mg/dL (ref 8–23)
CO2: 21 mmol/L — ABNORMAL LOW (ref 22–32)
Calcium: 9 mg/dL (ref 8.9–10.3)
Chloride: 104 mmol/L (ref 98–111)
Creatinine, Ser: 0.58 mg/dL (ref 0.44–1.00)
GFR, Estimated: >60 mL/min (ref >60)
Glucose, Bld: 107 mg/dL — ABNORMAL HIGH (ref 70–99)
Phosphorus: 2.5 mg/dL (ref 2.5–4.6)
Potassium: 3.9 mmol/L (ref 3.5–5.1)
Sodium: 135 mmol/L (ref 135–145)

## 2023-12-02 LAB — GLUCOSE, CAPILLARY
Glucose-Capillary: 106 mg/dL — ABNORMAL HIGH (ref 70–99)
Glucose-Capillary: 115 mg/dL — ABNORMAL HIGH (ref 70–99)
Glucose-Capillary: 122 mg/dL — ABNORMAL HIGH (ref 70–99)
Glucose-Capillary: 126 mg/dL — ABNORMAL HIGH (ref 70–99)
Glucose-Capillary: 132 mg/dL — ABNORMAL HIGH (ref 70–99)
Glucose-Capillary: 147 mg/dL — ABNORMAL HIGH (ref 70–99)

## 2023-12-02 LAB — SODIUM: Sodium: 133 mmol/L — ABNORMAL LOW (ref 135–145)

## 2023-12-02 MED ORDER — BANATROL TF EN LIQD
60.0000 mL | Freq: Two times a day (BID) | ENTERAL | Status: DC
  Administered 2023-12-02 – 2023-12-04 (×5): 60 mL
  Filled 2023-12-02 (×5): qty 60

## 2023-12-02 NOTE — Progress Notes (Signed)
 NAME:  Caleyah Jr, MRN:  [id number], DOB:  July 03, 1960, LOS: 12 ADMISSION DATE:  11/20/2023, CONSULTATION DATE:  11/20/23 REFERRING MD:  EDP, CHIEF COMPLAINT:  headache   History of Present Illness:  63 year old woman w/ hx of smoking, obesity presenting with headache.  Workup reveals large subarachnoid hemorrhage.  CTA neg but V4 of both vertebrals and distal basilar artery taper off concerning for possible early vasospasm and making full eval of this area difficult.  NSGY coming in to consider drain.  Intermittent purposeful movement and occasionally says ow to nursing.  PCCM to admit.  Significant Hospital Events: Including procedures, antibiotic start and stop dates in addition to other pertinent events   11/20/23 admit, EVD placement 7/13 CT head>>  1. Diffuse basilar subarachnoid hemorrhage with intraventricular extension and early ventriculomegaly, greatest along the left cerebellopontine angle cistern, possibly indicating the site of origin. 7/14 EEG>>This study is suggestive of moderate to severe diffuse encephalopathy, nonspecific etiology but likely related to sedation. No seizures or epileptiform discharges were seen throughout the recording. Extubated. 7/15 limited angiogram- no aneurysms, AVMs, or fistulas. CVC& aline removed. 7/21 new EVD placed overnight early AM due to malfunction of old catheter. TCD's with increased bilateral MCA velocities but no clinical correlate. 7/23 angiogram completed-no intracranial aneurysms/malformations/high flow fistulas noted.  Mild to moderate vasospasm  7/24 improved neuroexam on this morning as well as pain from headache, still remains off getting opiates  Interim History / Subjective:   Complaints of headache improving, received Tylenol  instead of opiates Afebrile Room air Good urine output EVD clamped  Objective    Blood pressure (!) 117/52, pulse 61, temperature 98.3 F (36.8 C), temperature source Axillary, resp. rate (!) 21, height 5'  7 (1.702 m), weight 84.4 kg, SpO2 98%.        Intake/Output Summary (Last 24 hours) at 12/02/2023 0855 Last data filed at 12/02/2023 0700 Gross per 24 hour  Intake 3406.34 ml  Output 1959 ml  Net 1447.34 ml   Filed Weights   11/30/23 0500 12/01/23 0500 12/02/23 0411  Weight: 85.5 kg 87.7 kg 84.4 kg    Examination:  General: Acute on chronically -year-old female, lying in ICU bed, NAD HEENT: Normocephalic, EVD clamped  serosanguineous output, surgical site clean/intact, PERRLA intact, pink MM CV: S1, S2 RRR, no MRG, no JVD Pulm: Clear no accessory muscle use ABS: Bowel sounds active, core track in place Extremities: No edema, no deformity, moves all extremities on command Neuro: RASS 0 to -1, awake, interactive, nonfocal GU: Foley in place, yellow urine  Blood culture: NGTD  x 4 days  Urine culture: E.coli EEG: mild diffuse encephalopathy CSF culture: still pending, NGTD x 3 days   Labs show normal electrolytes, mild leukocytosis  Resolved:     Assessment and Plan   Nontraumatic SAH with hydrocephalus s/p EVD 7/13 with replacement 7/21 early AM due to malfunctioning old EVD Headache 7/22 Cerebral angiogram completed-no intracranial aneurysms/malformations/high flow fistulas noted.  Mild to moderate vasospasm involving proximal segments of anterior/middle cerebral arteries B/L  P: Neurosurgery assisting, appreciate management EVD clamped 7/24 Continue Nimotop  Continue serial neuroexams Continue neuroprotective measures   Hyperglycemia- controlled P: Continue sliding scale insulin  Continue CBG every 4 hours    Fevers associated with encephalopathy - CSF gram stain 7/20 negative and cultures NGTD.  E.coli UTI (resistant to Ampicillin, intermediate to Unasyn). 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 P: Repeat sodiums every 12h - avoiding hypotonic fluids, restrict free water at this time  - Salt tabs  Dysphagia, poor PO intake P: Continue core track and tube feeds,until oral intake increases Continue speech therapy, appreciate assistance  Urinary retention - has required foley placement, could be medication related  P:  Continue Flomax  and bethanechol  for now Remove Foley   H/o tobaco abuse P: Continue counseling regarding importance of quitting Continue nicotine  patch Will need referral for lung cancer screening-recommendation at discharge    Best Practice (right click and Reselect all SmartList Selections daily)   Diet/type: tubefeeds DVT prophylaxis lovenox  Pressure ulcer(s): N/A GI prophylaxis: PPI Lines: N/A EVD Foley:  removal ordered  Code Status:  full code Last date of multidisciplinary goals of care discussion [family updated at bedside on 7/23   Critical care time:  31 min    Devonta Blanford V. Jude MD 12/02/2023, 8:55 AM  Please see [email or web address] for pager details.  From 7A-7P if no response, please call [phone]. After hours, please call ELink [phone].

## 2023-12-02 NOTE — TOC Progression Note (Signed)
 Transition of Care [facility]) - Progression Note    Patient Details  Name: Andrea Bridges MRN: [id number] Date of Birth: 09-14-1960  [facility] CM/SW Contact  Inocente GORMAN Kindle, LCSW Phone Number: 12/02/2023, 4:23 PM  Clinical Narrative:    TOC continuing to follow with CIR for medical readiness.    Expected Discharge Plan: IP Rehab Facility Barriers to Discharge: English as a second language teacher, Continued Medical Work up               Expected Discharge Plan and Services   Discharge Planning Services: CM Consult Post Acute Care Choice: NA Living arrangements for the past 2 months: Single Family Home                 DME Arranged: N/A         HH Arranged: NA           Social Drivers of Health (SDOH) Interventions SDOH Screenings   Food Insecurity: Patient Unable To Answer (11/20/2023)  Housing: Patient Unable To Answer (11/20/2023)  Transportation Needs: Patient Unable To Answer (11/20/2023)  Utilities: Patient Unable To Answer (11/20/2023)  Depression (PHQ2-9): Low Risk  (09/22/2023)  Social Connections: Unknown (09/22/2021)   Received from [facility]  Tobacco Use: High Risk (12/02/2023)    Readmission Risk Interventions    11/21/2023    4:34 PM  Readmission Risk Prevention Plan  Post Dischage Appt Complete  Medication Screening Complete  Transportation Screening Complete

## 2023-12-02 NOTE — Progress Notes (Signed)
 Physical Therapy Treatment Patient Details Name: Andrea Bridges MRN: [id number] DOB: 11-11-1960 Today's Date: 12/02/2023   History of Present Illness Pt is a 63 y.o. female who presented 11/20/23 with a headache. Upon arrival to ED, she became nauseated and unresponsive. 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 S/p R frontal ventricular catheter placement 7/13. ETT 7/13-7/14. Cortrak placed 7/14. S/p diagnostic cerebral angiogram 7/15, but prematurely aborted due to pt agitation. PMH: anxiety, skin cancer    PT Comments  Pt with limited tolerance this date, up in chair and endorsing severe headache. Pt overall tolerating repeated transfers and step pivot transfer back to bed, otherwise limited by headache, fatigue, and need to have BM. Pt overall requiring mod +2 assist for mobility at this time. PT to continue to follow.      If plan is discharge home, recommend the following: Assistance with cooking/housework;Direct supervision/assist for medications management;Direct supervision/assist for financial management;Assist for transportation;Help with stairs or ramp for entrance;Supervision due to cognitive status;A lot of help with walking and/or transfers;A lot of help with bathing/dressing/bathroom   Can travel by private vehicle        Equipment Recommendations  Other (comment) (tbd)    Recommendations for Other Services       Precautions / Restrictions Precautions Precautions: Fall;Other (comment) Recall of Precautions/Restrictions: Impaired Precaution/Restrictions Comments: SBP < 160; EVD (clamped prior to mobility) Restrictions Weight Bearing Restrictions Per Provider Order: No     Mobility  Bed Mobility Overal bed mobility: Needs Assistance Bed Mobility: Supine to Sit Rolling: Max assist       Sit to sidelying: Max assist General bed mobility comments: up in chair upon PT arrival to room, max assist for LE lift and trunk lower onto bed. Additional  management for lines/leads from RN    Transfers Overall transfer level: Needs assistance Equipment used: Rolling walker (2 wheels) Transfers: Sit to/from Stand, Bed to chair/wheelchair/BSC Sit to Stand: Mod assist, +2 safety/equipment   Step pivot transfers: Mod assist, +2 safety/equipment       General transfer comment: assist for power up with bed pad assist. Stand x3 from recliner, lateral stepping and pivot towards L back to bed.    Ambulation/Gait                   Stairs             Wheelchair Mobility     Tilt Bed    Modified Rankin (Stroke Patients Only) Modified Rankin (Stroke Patients Only) Pre-Morbid Rankin Score: No symptoms Modified Rankin: Moderately severe disability     Balance Overall balance assessment: Needs assistance Sitting-balance support: Feet supported, Bilateral upper extremity supported Sitting balance-Leahy Scale: Fair     Standing balance support: Bilateral upper extremity supported, Reliant on assistive device for balance Standing balance-Leahy Scale: Poor                              Communication Communication Communication: Impaired Factors Affecting Communication: Difficulty expressing self  Cognition Arousal: Lethargic Behavior During Therapy: Flat affect   PT - Cognitive impairments: Problem solving, Safety/Judgement                       PT - Cognition Comments: very slowed processing, does verbalize throughout session but mostly monosyllabically. Internally distracted by headache pain Following commands: Impaired Following commands impaired: Follows one step commands with increased time    Cueing  Cueing Techniques: Verbal cues, Gestural cues, Tactile cues  Exercises      General Comments General comments (skin integrity, edema, etc.): vss      Pertinent Vitals/Pain Pain Assessment Pain Assessment: Faces Faces Pain Scale: Hurts even more Pain Location: head Pain Descriptors /  Indicators: Headache Pain Intervention(s): Limited activity within patient's tolerance, Monitored during session, Repositioned    Home Living                          Prior Function            PT Goals (current goals can now be found in the care plan section) Acute Rehab PT Goals Patient Stated Goal: to improve PT Goal Formulation: With patient/family Time For Goal Achievement: 12/07/23 Potential to Achieve Goals: Good Progress towards PT goals: Progressing toward goals    Frequency    Min 2X/week      PT Plan      Co-evaluation              AM-PAC PT 6 Clicks Mobility   Outcome Measure  Help needed turning from your back to your side while in a flat bed without using bedrails?: A Lot Help needed moving from lying on your back to sitting on the side of a flat bed without using bedrails?: A Lot Help needed moving to and from a bed to a chair (including a wheelchair)?: A Lot Help needed standing up from a chair using your arms (e.g., wheelchair or bedside chair)?: A Lot Help needed to walk in hospital room?: Total Help needed climbing 3-5 steps with a railing? : Total 6 Click Score: 10    End of Session   Activity Tolerance: Patient limited by fatigue;Patient limited by pain Patient left: in bed;with call bell/phone within reach;with nursing/sitter in room (RN at bedside to apply rectal pouch, perform bladder scan. RN to turn on bed alarm upon room exit) Nurse Communication: Mobility status PT Visit Diagnosis: Unsteadiness on feet (R26.81);Other abnormalities of gait and mobility (R26.89);Pain Pain - Right/Left:  (headache)     Time: 8849-8787 PT Time Calculation (min) (ACUTE ONLY): 22 min  Charges:    $Therapeutic Activity: 8-22 mins PT General Charges $$ ACUTE PT VISIT: 1 Visit                     Johana RAMAN, PT DPT Acute Rehabilitation Services Secure Chat Preferred  Office [phone]    Kathline Banbury E Johna 12/02/2023, 3:10 PM

## 2023-12-02 NOTE — Progress Notes (Signed)
  NEUROSURGERY PROGRESS NOTE   Doing well this am, no significant complaints. No issues overnight  EXAM:  BP 111/73   Pulse 76   Temp (!) 96.4 F (35.8 C) (Axillary)   Resp (!) 22   Ht 5' 7 (1.702 m)   Wt 84.4 kg   LMP  (LMP Unknown)   SpO2 98%   BMI 29.14 kg/m   Awake, alert Speech fluent CN grossly intact  Good strength throughout, no drift Right groin site soft EVD in place, clamped   TCD: Date POD PCO2 HCT BP   MCA ACA PCA OPHT SIPH VERT Basilar  7/14 GC         Right  Left   50  51          25  22   14  17   22   *   *  *   *       7/16 JH       39 117/65  Right  Left    60   62    -18   -40    37   39    19   14    20    43    -54   -67    -51       7/18  JH     38.9   113/65 Right  Left    70   63    -38   -34    35   30    15   14     38   33    -33   -34    -52       7/21 JH      38.9  133/62  Right  Left    132   137    -31   -20    39   54    23   17    54   34    -63   -81    -59        IMPRESSION:  63 y.o. female SAH d# 12, CTA negative and angio negative. HCP, tolerated EVD clamping over last 24hrs. Remains neurologically intact, with mild-mod angiographic spasm correlating well with TCD. Mild hyponatremia  PLAN: - Cont EVD clamped, can repeat CTH in am and d/c tomorrow am if exam stable. - Cont to monitor in ICU - Nimotop  60q4 to complete 21d - Cont PT/OT/SLP - TCD today   Gerldine Maizes, MD [facility]

## 2023-12-02 NOTE — Progress Notes (Signed)
 Occupational Therapy Treatment Patient Details Name: Andrea Bridges MRN: [id number] DOB: 1960-05-29 Today's Date: 12/02/2023   History of present illness Pt is a 63 y.o. female who presented 11/20/23 with a headache. Upon arrival to ED, she became nauseated and unresponsive. 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 S/p R frontal ventricular catheter placement 7/13. ETT 7/13-7/14. Cortrak placed 7/14. S/p diagnostic cerebral angiogram 7/15, but prematurely aborted due to pt agitation. PMH: anxiety, skin cancer   OT comments  Patient with fair progress toward patient focused goals.  Patient continues to express headache, weakness, poor balance, and difficulty following and initiating one step commands.  Max A for lower body ADL, Mod A for upper body ADL and simple transfers.  OT to continue efforts in the acute setting and Patient will benefit from intensive inpatient follow-up therapy, >3 hours/day.      If plan is discharge home, recommend the following:  A lot of help with bathing/dressing/bathroom;A lot of help with walking and/or transfers;Assistance with cooking/housework;Assist for transportation;Direct supervision/assist for financial management;Direct supervision/assist for medications management;Supervision due to cognitive status;Help with stairs or ramp for entrance   Equipment Recommendations  BSC/3in1;Tub/shower seat    Recommendations for Other Services      Precautions / Restrictions Precautions Precautions: Fall;Other (comment) Recall of Precautions/Restrictions: Impaired Precaution/Restrictions Comments: SBP < 160; EVD (clamp prior to mobility) Restrictions Weight Bearing Restrictions Per Provider Order: No       Mobility Bed Mobility Overal bed mobility: Needs Assistance Bed Mobility: Supine to Sit     Supine to sit: Mod assist, Max assist          Transfers Overall transfer level: Needs assistance Equipment used: Rolling walker (2  wheels) Transfers: Sit to/from Stand, Bed to chair/wheelchair/BSC Sit to Stand: Min assist, Mod assist     Step pivot transfers: Min assist           Balance Overall balance assessment: Needs assistance Sitting-balance support: Feet supported, Bilateral upper extremity supported Sitting balance-Leahy Scale: Poor   Postural control: Posterior lean Standing balance support: Bilateral upper extremity supported Standing balance-Leahy Scale: Poor                             ADL either performed or assessed with clinical judgement   ADL       Grooming: Minimal assistance;Sitting   Upper Body Bathing: Moderate assistance;Sitting   Lower Body Bathing: Maximal assistance;Sit to/from stand   Upper Body Dressing : Maximal assistance;Sitting   Lower Body Dressing: Maximal assistance;Sit to/from stand   Toilet Transfer: Minimal assistance;Stand-pivot;BSC/3in1;Rolling walker (2 wheels)   Toileting- Clothing Manipulation and Hygiene: Total assistance;Sit to/from stand              Extremity/Trunk Assessment Upper Extremity Assessment Upper Extremity Assessment: Generalized weakness   Lower Extremity Assessment Lower Extremity Assessment: Defer to PT evaluation   Cervical / Trunk Assessment Cervical / Trunk Assessment: Normal    Vision Patient Visual Report: No change from baseline     Perception Perception Perception: Not tested   Praxis Praxis Praxis: Not tested   Communication Communication Communication: Impaired Factors Affecting Communication: Difficulty expressing self   Cognition Arousal: Lethargic Behavior During Therapy: Flat affect Cognition: Cognition impaired         Attention impairment (select first level of impairment): Sustained attention Executive functioning impairment (select all impairments): Initiation, Sequencing, Problem solving  Following commands: Impaired Following commands impaired: Follows  one step commands inconsistently, Follows one step commands with increased time      Cueing   Cueing Techniques: Verbal cues, Gestural cues, Tactile cues  Exercises      Shoulder Instructions       General Comments      Pertinent Vitals/ Pain       Pain Assessment Pain Assessment: Faces Faces Pain Scale: Hurts even more Pain Location: head Pain Descriptors / Indicators: Headache Pain Intervention(s): Monitored during session                                                          Frequency  Min 2X/week        Progress Toward Goals  OT Goals(current goals can now be found in the care plan section)  Progress towards OT goals: Progressing toward goals  Acute Rehab OT Goals OT Goal Formulation: With patient Time For Goal Achievement: 12/07/23 Potential to Achieve Goals: Fair  Plan      Co-evaluation                 AM-PAC OT 6 Clicks Daily Activity     Outcome Measure   Help from another person eating meals?: A Little Help from another person taking care of personal grooming?: A Little Help from another person toileting, which includes using toliet, bedpan, or urinal?: A Lot Help from another person bathing (including washing, rinsing, drying)?: A Lot Help from another person to put on and taking off regular upper body clothing?: A Lot Help from another person to put on and taking off regular lower body clothing?: A Lot 6 Click Score: 14    End of Session Equipment Utilized During Treatment: Gait belt  OT Visit Diagnosis: Unsteadiness on feet (R26.81);Other abnormalities of gait and mobility (R26.89);Muscle weakness (generalized) (M62.81)   Activity Tolerance Patient limited by pain   Patient Left in chair;with call bell/phone within reach;with family/visitor present;with chair alarm set   Nurse Communication Mobility status        Time: 1000-1033 OT Time Calculation (min): 33 min  Charges: OT General Charges $OT  Visit: 1 Visit OT Treatments $Self Care/Home Management : 8-22 mins $Therapeutic Activity: 8-22 mins  12/02/2023  RP, OTR/L  Acute Rehabilitation Services  Office:  [phone]   Andrea Bridges 12/02/2023, 10:38 AM

## 2023-12-02 NOTE — Progress Notes (Signed)
 eLink Physician-Brief Progress Note [name] DOB: August 30, 1960 MRN: [id number]   Date of Service  12/02/2023  HPI/Events of Note  Patient with recurrent urinary retention, bladder scan positive for 650 ml of urine in the bladder. Other measures have proven ineffective.  eICU Interventions  Foley catheter ordered.        Calvyn Kurtzman U Nolita Kutter 12/02/2023, 9:37 PM

## 2023-12-02 NOTE — Progress Notes (Signed)
   12/02/23 0200  Urine Measurement/Characteristics  Urinary Interventions Bladder scan;Intermittent/Straight cath  Bladder Scan Volume (mL) 715 mL  Intermittent/Straight Cath (mL) 850 mL  Intermittent Catheter Size 16  Hygiene Peri care   Pt  I & O cathed X 1 post foley removal.

## 2023-12-02 NOTE — Progress Notes (Signed)
 Speech Language Pathology Treatment: Cognitive-Linguistic  Patient Details Name: Andrea Bridges MRN: [id number] DOB: 1961/03/16 Today's Date: 12/02/2023 Time: 8658-8647 SLP Time Calculation (min) (ACUTE ONLY): 11 min  Assessment / Plan / Recommendation Clinical Impression  Brief session; pt groggy but interactive, head aching. Her ex-husband and family were at the bedside. She was oriented to some elements of time and place. Fully oriented to biographical hx.  Decreased initiation/spontaneity; needed verbal cues to follow simple commands and respond to questions. Speech clear/fluent.  We discussed some of the ways SLP will be working with Andrea Bridges once she begins to feel better and is able to better participate. SLP will follow.   HPI HPI: Patient is a 63 y.o. female with PMH: PCOS, smoking/vaping, obsity, skin CA, anxiety. She presented to the hospital on 11/20/23 with acute onset headache. Imaging revealed large SAH. Ventricular drain placed by neurosurgery. She was intubated 7/13 and extubated 7/14 to supplemental oxygen via [place] and currently on RA.      SLP Plan  Continue with current plan of care          Recommendations                      Frequent or constant Supervision/Assistance Cognitive communication deficit (R41.841)     Continue with current plan of care    Andrea Westergaard L. Vona, MA CCC/SLP Clinical Specialist - Acute Care SLP Acute Rehabilitation Services Office number [phone]  Andrea Bridges  12/02/2023, 3:03 PM

## 2023-12-02 NOTE — Progress Notes (Addendum)
 Nutrition Follow-up  DOCUMENTATION CODES:  Not applicable  INTERVENTION:  Tube feeds via Cortrak: Osmolite 1.5 @ 55 mL/hr (1320 mL per day) 60 mL ProSource TF20 - Daily Regimen provides 2060 calories, 103 gm protein, and 1006 mL free water daily.   As intake improves will adjust enteral nutrition   Ensure Plus High Protein po BID, each supplement provides 350 kcal and 20 grams of protein.- pt prefers chocolate   NUTRITION DIAGNOSIS:  Inadequate oral intake related to inability to eat as evidenced by NPO status. Ongoing.   GOAL:  Patient will meet greater than or equal to 90% of their needs Met with TF at goal and diet advancement  MONITOR:  Vent status, Labs, Weight trends, I & O's  REASON FOR ASSESSMENT:  Ventilator   ASSESSMENT:  63 y.o. female presented to the ED with headache. PMH includes anxiety and PCOS. Pt admitted with SAH.  Pt discussed during ICU rounds and with RN and MD.  Spoke with RN, pt not eating much due to lethargy. Did have a yogurt and a fruit cup today. Suspect poor intake is more of a lethargy/HA issue and not appetite. Once mental status improves would not likely need the tube.  Plan to continue current TF regimen and as pt symptoms improve expect intake to improve and will adjust tube feeding.   Noted diarrhea with FMS, likely due to nimotop   7/13 - Admitted; Op s/p R frontal ventricular catheter placement 7/14 - Extubated; Cortrak placed 7/16 - diet advanced to Regular   Medications reviewed and include: SSI every 4 hours, nimotop , protonix , miralax , senokot    Labs reviewed:  CBG's: 117-147  ICP: 183 ml -> 26 ml   Diet Order:   Diet Order             Diet regular Room service appropriate? Yes; Fluid consistency: Thin  Diet effective now                  EDUCATION NEEDS: Not appropriate for education at this time  Skin:  Skin Assessment: Reviewed RN Assessment  Last BM:  7/25; FMS replaced  Height:  Ht Readings from Last  1 Encounters:  11/21/23 5' 7 (1.702 m)   Weight:  Wt Readings from Last 1 Encounters:  12/02/23 84.4 kg   Ideal Body Weight:  61.4 kg  BMI:  Body mass index is 29.14 kg/m.  Estimated Nutritional Needs:  Kcal:  1900-2100 Protein:  95-115 grams Fluid:  >/= 1.9 L  Sherly Brodbeck P., RD, LDN, CNSC See AMiON for contact information

## 2023-12-02 NOTE — Progress Notes (Signed)
 Transcranial Doppler   Date POD PCO2 HCT BP   MCA ACA PCA OPHT SIPH VERT Basilar  7/14 GC         Right  Left   50  51          25  22   14  17   22   *   *  *   *       7/16 JH       39 117/65  Right  Left    60   62    -18   -40    37   39    19   14    20    43    -54   -67    -51       7/18  JH     38.9   113/65 Right  Left    70   63    -38   -34    35   30    15   14     38   33    -33   -34    -52       7/21 JH      38.9  133/62  Right  Left    132   137    -31   -20    39   54    23   17    54   34    -63   -81    -59       7/23,rs            Right  Left    149   118    -94   -77    52   64    28   20    66   61    -56   -94    -46        7/25 CK         Right  Left    116   120    -112   -22    -34   58    27   20    58   38    *   *    -44                 Right  Left                                                       MCA = Middle Cerebral Artery      OPHT = Opthalmic Artery     BASILAR = Basilar Artery   ACA = Anterior Cerebral Artery     SIPH = Carotid Siphon PCA = Posterior Cerebral Artery   VERT = Verterbral Artery                    Normal MCA = 62+\-12 ACA = 50+\-12 PCA = 42+\-23    Right Lindegaard ratio : * Left Lindegaard ratio : 2.55

## 2023-12-03 ENCOUNTER — Inpatient Hospital Stay (HOSPITAL_COMMUNITY)

## 2023-12-03 DIAGNOSIS — E871 Hypo-osmolality and hyponatremia: Secondary | ICD-10-CM | POA: Diagnosis not present

## 2023-12-03 DIAGNOSIS — R339 Retention of urine, unspecified: Secondary | ICD-10-CM | POA: Diagnosis not present

## 2023-12-03 DIAGNOSIS — R1310 Dysphagia, unspecified: Secondary | ICD-10-CM | POA: Diagnosis not present

## 2023-12-03 DIAGNOSIS — I609 Nontraumatic subarachnoid hemorrhage, unspecified: Secondary | ICD-10-CM | POA: Diagnosis not present

## 2023-12-03 LAB — GLUCOSE, CAPILLARY
Glucose-Capillary: 104 mg/dL — ABNORMAL HIGH (ref 70–99)
Glucose-Capillary: 115 mg/dL — ABNORMAL HIGH (ref 70–99)
Glucose-Capillary: 131 mg/dL — ABNORMAL HIGH (ref 70–99)
Glucose-Capillary: 141 mg/dL — ABNORMAL HIGH (ref 70–99)
Glucose-Capillary: 94 mg/dL (ref 70–99)
Glucose-Capillary: 96 mg/dL (ref 70–99)

## 2023-12-03 LAB — RENAL FUNCTION PANEL
Albumin: 2.7 g/dL — ABNORMAL LOW (ref 3.5–5.0)
Anion gap: 6 (ref 5–15)
BUN: 19 mg/dL (ref 8–23)
CO2: 21 mmol/L — ABNORMAL LOW (ref 22–32)
Calcium: 8.6 mg/dL — ABNORMAL LOW (ref 8.9–10.3)
Chloride: 107 mmol/L (ref 98–111)
Creatinine, Ser: 0.52 mg/dL (ref 0.44–1.00)
GFR, Estimated: >60 mL/min (ref >60)
Glucose, Bld: 113 mg/dL — ABNORMAL HIGH (ref 70–99)
Phosphorus: 2.5 mg/dL (ref 2.5–4.6)
Potassium: 3.6 mmol/L (ref 3.5–5.1)
Sodium: 134 mmol/L — ABNORMAL LOW (ref 135–145)

## 2023-12-03 LAB — CBC
HCT: 33.2 % — ABNORMAL LOW (ref 36.0–46.0)
Hemoglobin: 11.5 g/dL — ABNORMAL LOW (ref 12.0–15.0)
MCH: 27.7 pg (ref 26.0–34.0)
MCHC: 34.6 g/dL (ref 30.0–36.0)
MCV: 80 fL (ref 80.0–100.0)
Platelets: 357 K/uL (ref 150–400)
RBC: 4.15 MIL/uL (ref 3.87–5.11)
RDW: 14.1 % (ref 11.5–15.5)
WBC: 10.3 K/uL (ref 4.0–10.5)
nRBC: 0 % (ref 0.0–0.2)

## 2023-12-03 LAB — MAGNESIUM: Magnesium: 2.3 mg/dL (ref 1.7–2.4)

## 2023-12-03 MED ORDER — LOPERAMIDE HCL 1 MG/7.5ML PO SUSP
2.0000 mg | Freq: Once | ORAL | Status: AC
  Administered 2023-12-03: 2 mg
  Filled 2023-12-03: qty 15

## 2023-12-03 MED ORDER — LOPERAMIDE HCL 1 MG/7.5ML PO SUSP
2.0000 mg | ORAL | Status: DC | PRN
  Administered 2023-12-04: 2 mg
  Filled 2023-12-03: qty 15

## 2023-12-03 NOTE — Progress Notes (Signed)
 Patient ID: Andrea Bridges, female   DOB: Jun 19, 1960, 63 y.o.   MRN: [id number] BP 130/70   Pulse 72   Temp 98.2 F (36.8 C) (Oral)   Resp (!) 23   Ht 5' 7 (1.702 m)   Wt 85.7 kg   LMP  (LMP Unknown)   SpO2 99%   BMI 29.59 kg/m  Alert and oriented Following commaNds Moving all extremities Ventricular catheter removed.  Doing well

## 2023-12-03 NOTE — Progress Notes (Signed)
 NAME:  Andrea Bridges, MRN:  [id number], DOB:  1960/09/12, LOS: 13 ADMISSION DATE:  11/20/2023, CONSULTATION DATE:  11/20/23 REFERRING MD:  EDP, CHIEF COMPLAINT:  headache   History of Present Illness:  63 year old woman w/ hx of smoking, obesity presenting with headache.  Workup reveals large subarachnoid hemorrhage.  CTA neg but V4 of both vertebrals and distal basilar artery taper off concerning for possible early vasospasm and making full eval of this area difficult.  NSGY coming in to consider drain.  Intermittent purposeful movement and occasionally says ow to nursing.  PCCM to admit.  Significant Hospital Events: Including procedures, antibiotic start and stop dates in addition to other pertinent events   11/20/23 admit, EVD placement 7/13 CT head>>  1. Diffuse basilar subarachnoid hemorrhage with intraventricular extension and early ventriculomegaly, greatest along the left cerebellopontine angle cistern, possibly indicating the site of origin. 7/14 EEG>>This study is suggestive of moderate to severe diffuse encephalopathy, nonspecific etiology but likely related to sedation. No seizures or epileptiform discharges were seen throughout the recording. Extubated. 7/15 limited angiogram- no aneurysms, AVMs, or fistulas. CVC& aline removed. 7/21 new EVD placed overnight early AM due to malfunction of old catheter. TCD's with increased bilateral MCA velocities but no clinical correlate. 7/23 angiogram completed-no intracranial aneurysms/malformations/high flow fistulas noted.  Mild to moderate vasospasm  7/24 improved neuroexam on this morning as well as pain from headache, still remains off getting opiates  Interim History / Subjective:   Complaints of diarrhea Headache persists requiring opiates EVD clamped since Thursday am  Objective    Blood pressure (!) 104/43, pulse 80, temperature 98.3 F (36.8 C), temperature source Axillary, resp. rate 18, height 5' 7 (1.702 m), weight 85.7 kg, SpO2  94%.        Intake/Output Summary (Last 24 hours) at 12/03/2023 0820 Last data filed at 12/03/2023 0700 Gross per 24 hour  Intake 3200.1 ml  Output 3550 ml  Net -349.9 ml   Filed Weights   12/01/23 0500 12/02/23 0411 12/03/23 0500  Weight: 87.7 kg 84.4 kg 85.7 kg    Examination:  General: Acute on chronically -year-old female, lying in bed, NAD HEENT: Normocephalic, EVD clamped , surgical site clean/intact, PERRLA intact, pink MM CV: S1, S2 RRR, no MRG, no JVD Pulm: No accessory muscle use, clear bilateral ABS: Bowel sounds active, core track in place Extremities: No edema, no deformity, moves all extremities on command Neuro: RASS 0 , awake, interactive, nonfocal GU: Foley in place, yellow urine  Blood culture: NGTD  x 4 days  Urine culture: E.coli EEG: mild diffuse encephalopathy CSF culture: still pending, NGTD x 3 days   Labs show mild hyponatremia, decreasedleukocytosis Head CT shows no hydrocephalus, decreased SAH and IVH  Resolved:     Assessment and Plan   Nontraumatic SAH with hydrocephalus s/p EVD 7/13 with replacement 7/21 early AM due to malfunctioning old EVD Headache 7/22 Cerebral angiogram completed-no intracranial aneurysms/malformations/high flow fistulas noted.  Mild to moderate vasospasm involving proximal segments of anterior/middle cerebral arteries B/L  P: Neurosurgery following EVD clamped 7/24, TCD's high MCA and ACA territory , head CT appears okay, can we DC EVD? Continue Nimotop  Continue serial neuroexams Continue neuroprotective measures   Hyperglycemia- controlled P: Continue sliding scale insulin  Continue CBG every 4 hours    Fevers associated with encephalopathy - CSF gram stain 7/20 negative and cultures NGTD.  E.coli UTI (resistant to Ampicillin, intermediate to Unasyn). 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 P: Repeat sodiums every 12h - avoiding hypotonic fluids, restrict free water at this time  - Salt tabs  Dysphagia, poor PO intake P: Continue core track and 1/2 dose tube feeds,until oral intake increases Continue speech therapy, appreciate assistance  Urinary retention -Foley replaced multiple times, could be medication related  P:  Continue Flomax  and bethanechol  for now Attempt Foley removal again once off narcotics   H/o tobaco abuse P: Continue counseling regarding importance of quitting Continue nicotine  patch Will need referral for lung cancer screening-recommendation at discharge  Ex-husband updated at bedside  Best Practice (right click and Reselect all SmartList Selections daily)   Diet/type: tubefeeds DVT prophylaxis lovenox  Pressure ulcer(s): N/A GI prophylaxis: PPI Lines: N/A EVD Foley:  removal ordered  Code Status:  full code Last date of multidisciplinary goals of care discussion [family updated at bedside on 7/23    Chanin Frumkin V. Jude MD 12/03/2023, 8:20 AM  Please see [email or web address] for pager details.  From 7A-7P if no response, please call [phone]. After hours, please call ELink [phone].

## 2023-12-04 DIAGNOSIS — I609 Nontraumatic subarachnoid hemorrhage, unspecified: Secondary | ICD-10-CM | POA: Diagnosis not present

## 2023-12-04 DIAGNOSIS — R339 Retention of urine, unspecified: Secondary | ICD-10-CM | POA: Diagnosis not present

## 2023-12-04 DIAGNOSIS — E871 Hypo-osmolality and hyponatremia: Secondary | ICD-10-CM | POA: Diagnosis not present

## 2023-12-04 DIAGNOSIS — R1310 Dysphagia, unspecified: Secondary | ICD-10-CM | POA: Diagnosis not present

## 2023-12-04 LAB — RENAL FUNCTION PANEL
Albumin: 3 g/dL — ABNORMAL LOW (ref 3.5–5.0)
Anion gap: 12 (ref 5–15)
BUN: 14 mg/dL (ref 8–23)
CO2: 19 mmol/L — ABNORMAL LOW (ref 22–32)
Calcium: 9.1 mg/dL (ref 8.9–10.3)
Chloride: 102 mmol/L (ref 98–111)
Creatinine, Ser: 0.55 mg/dL (ref 0.44–1.00)
GFR, Estimated: >60 mL/min (ref >60)
Glucose, Bld: 100 mg/dL — ABNORMAL HIGH (ref 70–99)
Phosphorus: 3.1 mg/dL (ref 2.5–4.6)
Potassium: 4.5 mmol/L (ref 3.5–5.1)
Sodium: 133 mmol/L — ABNORMAL LOW (ref 135–145)

## 2023-12-04 LAB — GLUCOSE, CAPILLARY
Glucose-Capillary: 103 mg/dL — ABNORMAL HIGH (ref 70–99)
Glucose-Capillary: 119 mg/dL — ABNORMAL HIGH (ref 70–99)
Glucose-Capillary: 145 mg/dL — ABNORMAL HIGH (ref 70–99)
Glucose-Capillary: 94 mg/dL (ref 70–99)

## 2023-12-04 MED ORDER — MELATONIN 3 MG PO TABS
3.0000 mg | ORAL_TABLET | Freq: Once | ORAL | Status: AC
  Administered 2023-12-04: 3 mg via ORAL
  Filled 2023-12-04: qty 1

## 2023-12-04 NOTE — Progress Notes (Signed)
 eLink Physician-Brief Progress Note Patient Name: Aalaya Yadao DOB: 1960-09-27 MRN: [id number]   Date of Service  12/04/2023  HPI/Events of Note  Pt confused, trying to get up from bed.  Pt can be redirected, but would quickly forget and try to get up from bed again.  eICU Interventions  Placed order for a tele-sitter.         Kam Kushnir M DELA CRUZ 12/04/2023, 1:51 AM

## 2023-12-04 NOTE — Progress Notes (Signed)
 Patient ID: Andrea Bridges, female   DOB: 31-Aug-1960, 62 y.o.   MRN: [id number] BP 119/77   Pulse 62   Temp 97.6 F (36.4 C) (Oral)   Resp 17   Ht 5' 7 (1.702 m)   Wt 86.7 kg   LMP  (LMP Unknown)   SpO2 97%   BMI 29.94 kg/m  Alert, oriented x 4 No change neurologically since drain out Non aneurysmal SAH Doing well

## 2023-12-04 NOTE — Progress Notes (Signed)
 eLink Physician-Brief Progress Note [name] DOB: [birth year] MRN: [id number]   Date of Service  12/04/2023  HPI/Events of Note  Insomnia  eICU Interventions  Low dose melatonin x 1     Intervention Category Minor Interventions: Routine modifications to care plan (e.g. PRN medications for pain, fever)  Andrea Bridges 12/04/2023, 10:53 PM

## 2023-12-04 NOTE — Progress Notes (Addendum)
 NAME:  Andrea Bridges, MRN:  [id number], DOB:  1960-06-16, LOS: 14 ADMISSION DATE:  11/20/2023, CONSULTATION DATE:  11/20/23 REFERRING MD:  EDP, CHIEF COMPLAINT:  headache   History of Present Illness:  63 year old woman w/ hx of smoking, obesity presenting with headache.  Workup reveals large subarachnoid hemorrhage.  CTA neg but V4 of both vertebrals and distal basilar artery taper off concerning for possible early vasospasm and making full eval of this area difficult.  NSGY coming in to consider drain.  Intermittent purposeful movement and occasionally says ow to nursing.  PCCM to admit.  Significant Hospital Events: Including procedures, antibiotic start and stop dates in addition to other pertinent events   11/20/23 admit, EVD placement 7/13 CT head>>  1. Diffuse basilar subarachnoid hemorrhage with intraventricular extension and early ventriculomegaly, greatest along the left cerebellopontine angle cistern, possibly indicating the site of origin. 7/14 EEG>>This study is suggestive of moderate to severe diffuse encephalopathy, nonspecific etiology but likely related to sedation. No seizures or epileptiform discharges were seen throughout the recording. Extubated. 7/15 limited angiogram- no aneurysms, AVMs, or fistulas. CVC& aline removed. 7/21 new EVD placed overnight early AM due to malfunction of old catheter. TCD's with increased bilateral MCA velocities but no clinical correlate. 7/23 angiogram completed-no intracranial aneurysms/malformations/high flow fistulas noted.  Mild to moderate vasospasm  7/24 EVD clamped  7/26 EVD dc'd   Interim History / Subjective:   Some confusion overnight Complains of headache Diarrhea decreased, received Imodium  , tube feeds decreased to 25 On room air   Objective    Blood pressure 104/86, pulse 74, temperature 98.5 F (36.9 C), temperature source Axillary, resp. rate (!) 26, height 5' 7 (1.702 m), weight 86.7 kg, SpO2 99%.        Intake/Output  Summary (Last 24 hours) at 12/04/2023 0802 Last data filed at 12/04/2023 0700 Gross per 24 hour  Intake 1637.74 ml  Output 3395 ml  Net -1757.26 ml   Filed Weights   12/02/23 0411 12/03/23 0500 12/04/23 0327  Weight: 84.4 kg 85.7 kg 86.7 kg    Examination:  General: Acute on chronically -year-old female, lying in bed, NAD HEENT: Normocephalic, EVD out  surgical site clean/intact, PERRLA intact, pink MM CV: S1, S2 RRR, no MRG, no JVD Pulm: No accessory muscle use, clear bilateral ABS: Bowel sounds active, core track in place Extremities: No edema, no deformity Neuro: awake, interactive, nonfocal GU: Foley in place, yellow urine   Urine culture: E.coli EEG: mild diffuse encephalopathy CSF culture: , NGTD   Labs show mild hyponatremia Head CT 7/26 shows no hydrocephalus, decreased SAH and IVH  Resolved:     Assessment and Plan   Nontraumatic SAH with hydrocephalus s/p EVD 7/13 with replacement 7/21 early AM due to malfunctioning old EVD Headache 7/22 Cerebral angiogram >>no intracranial aneurysms/malformations/high flow fistulas noted.  Mild to moderate vasospasm involving proximal segments of anterior/middle cerebral arteries B/L  P: Neurosurgery following EVD out  Continue Nimotop  Continue serial neuroexams Continue neuroprotective measures Some confusion persistently, may be related to ICU delirium at this point   Hyperglycemia- controlled P: Continue CBG every 4 hours    Fevers associated with encephalopathy - CSF cultures NGTD.  E.coli UTI (resistant to Ampicillin, intermediate to Unasyn). 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 P: - avoiding hypotonic fluids, restrict free water at this time  -  Salt tabs DC IV fluids tomorrow-  Dysphagia, poor PO intake Diarrhea due to  tube feeds P: Oral intake improving, can DC core track & TFs  soon Continue speech therapy, appreciate assistance  Urinary retention -Foley replaced multiple times, could be medication related  P:  Continue Flomax  and bethanechol  for now Attempt Foley removal again once off narcotics , has failed multiple attempts   H/o tobaco abuse P: Continue counseling regarding importance of quitting Continue nicotine  patch Will need referral for lung cancer screening-recommendation at discharge    Best Practice (right click and Reselect all SmartList Selections daily)   Diet/type: tubefeeds DVT prophylaxis lovenox  Pressure ulcer(s): N/A GI prophylaxis: PPI Lines: N/A EVD Foley:  needed Code Status:  full code Last date of multidisciplinary goals of care discussion [ex husband updated at bedside on 7/26    Webber Michiels V. Jude MD 12/04/2023, 8:02 AM  Please see [email or web address] for pager details.  From 7A-7P if no response, please call [phone]. After hours, please call ELink [phone].

## 2023-12-05 ENCOUNTER — Inpatient Hospital Stay (HOSPITAL_COMMUNITY)

## 2023-12-05 DIAGNOSIS — E871 Hypo-osmolality and hyponatremia: Secondary | ICD-10-CM | POA: Diagnosis not present

## 2023-12-05 DIAGNOSIS — I609 Nontraumatic subarachnoid hemorrhage, unspecified: Secondary | ICD-10-CM | POA: Diagnosis not present

## 2023-12-05 DIAGNOSIS — R339 Retention of urine, unspecified: Secondary | ICD-10-CM | POA: Diagnosis not present

## 2023-12-05 DIAGNOSIS — R1310 Dysphagia, unspecified: Secondary | ICD-10-CM | POA: Diagnosis not present

## 2023-12-05 LAB — RENAL FUNCTION PANEL
Albumin: 2.9 g/dL — ABNORMAL LOW (ref 3.5–5.0)
Anion gap: 11 (ref 5–15)
BUN: 15 mg/dL (ref 8–23)
CO2: 23 mmol/L (ref 22–32)
Calcium: 9.4 mg/dL (ref 8.9–10.3)
Chloride: 99 mmol/L (ref 98–111)
Creatinine, Ser: 0.62 mg/dL (ref 0.44–1.00)
GFR, Estimated: >60 mL/min (ref >60)
Glucose, Bld: 99 mg/dL (ref 70–99)
Phosphorus: 3.9 mg/dL (ref 2.5–4.6)
Potassium: 3.6 mmol/L (ref 3.5–5.1)
Sodium: 133 mmol/L — ABNORMAL LOW (ref 135–145)

## 2023-12-05 LAB — GLUCOSE, CAPILLARY
Glucose-Capillary: 106 mg/dL — ABNORMAL HIGH (ref 70–99)
Glucose-Capillary: 110 mg/dL — ABNORMAL HIGH (ref 70–99)
Glucose-Capillary: 131 mg/dL — ABNORMAL HIGH (ref 70–99)
Glucose-Capillary: 141 mg/dL — ABNORMAL HIGH (ref 70–99)
Glucose-Capillary: 153 mg/dL — ABNORMAL HIGH (ref 70–99)
Glucose-Capillary: 93 mg/dL (ref 70–99)

## 2023-12-05 MED ORDER — BANATROL TF EN LIQD
60.0000 mL | Freq: Two times a day (BID) | ENTERAL | Status: DC
  Administered 2023-12-06 – 2023-12-09 (×6): 60 mL via ORAL
  Filled 2023-12-05 (×7): qty 60

## 2023-12-05 MED ORDER — SODIUM CHLORIDE 1 G PO TABS
2.0000 g | ORAL_TABLET | Freq: Three times a day (TID) | ORAL | Status: DC
  Administered 2023-12-05 – 2023-12-09 (×13): 2 g via ORAL
  Filled 2023-12-05 (×13): qty 2

## 2023-12-05 MED ORDER — TOPIRAMATE 25 MG PO TABS
25.0000 mg | ORAL_TABLET | Freq: Two times a day (BID) | ORAL | Status: DC
  Administered 2023-12-05 – 2023-12-09 (×8): 25 mg via ORAL
  Filled 2023-12-05 (×8): qty 1

## 2023-12-05 MED ORDER — ORAL CARE MOUTH RINSE: 15.0000 mL | OROMUCOSAL | Status: DC | PRN

## 2023-12-05 MED ORDER — BETHANECHOL CHLORIDE 10 MG PO TABS
10.0000 mg | ORAL_TABLET | Freq: Three times a day (TID) | ORAL | Status: DC
  Administered 2023-12-05 – 2023-12-09 (×13): 10 mg via ORAL
  Filled 2023-12-05 (×13): qty 1

## 2023-12-05 MED ORDER — LOPERAMIDE HCL 1 MG/7.5ML PO SUSP: 2.0000 mg | ORAL | Status: DC | PRN

## 2023-12-05 NOTE — Progress Notes (Signed)
 Physical Therapy Treatment Patient Details Name: Andrea Bridges MRN: [id number] DOB: June 26, 1960 Today's Date: 12/05/2023   History of Present Illness Pt is a 63 y.o. female who presented 11/20/23 with a headache. Upon arrival to ED, she became nauseated and unresponsive. 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 S/p R frontal ventricular catheter placement 7/13. ETT 7/13-7/14. Cortrak placed 7/14. S/p diagnostic cerebral angiogram 7/15, but prematurely aborted due to pt agitation. 7/21 new EVD placed overnight early AM due to malfunction of old catheter, plan for repeat angiogram 7/22. S/p EVD removal 7/26. PMH: anxiety, skin cancer    PT Comments  Pt tolerated session with headache managed with medication. Pt requires extra time with repeated cues for bed mobility but is able to initiate with slight assistance. Pt mildly unsteady on feet but progressing mobility. Pt ambulated with RW hallway but limited by fatigue. Required assist with for safety and walker management. Pt remains a good candidate for physical therapy post-acutely to progress overall functional mobility.    If plan is discharge home, recommend the following: Assistance with cooking/housework;Direct supervision/assist for medications management;Direct supervision/assist for financial management;Assist for transportation;Help with stairs or ramp for entrance;Supervision due to cognitive status;A lot of help with walking and/or transfers;A lot of help with bathing/dressing/bathroom   Can travel by private vehicle        Equipment Recommendations  Other (comment) (TBD)    Recommendations for Other Services       Precautions / Restrictions Precautions Precautions: Fall Precaution/Restrictions Comments: SBP <160 Restrictions Weight Bearing Restrictions Per Provider Order: No     Mobility  Bed Mobility Overal bed mobility: Needs Assistance Bed Mobility: Rolling, Sidelying to Sit Rolling: Mod assist, Used  rails Sidelying to sit: Mod assist       General bed mobility comments: VC for sequencing and assist for LE management. Pt initiate w/ LE but needs additional time    Transfers Overall transfer level: Needs assistance Equipment used: Rolling walker (2 wheels) Transfers: Sit to/from Stand Sit to Stand: Mod assist, +2 safety/equipment           General transfer comment: Assist for power up and steadying. VC for hand placement for walker management.    Ambulation/Gait Ambulation/Gait assistance: +2 safety/equipment, Min assist Gait Distance (Feet): 70 Feet Assistive device: Rolling walker (2 wheels) Gait Pattern/deviations: Step-through pattern, Decreased stride length, Trunk flexed, Drifts right/left Gait velocity: Decreased   Pre-gait activities: Marching in place w/ RW General Gait Details: VC to avoid colliding w/ obstacles, upright, and staying inside walker. Assist with walker management when drifting laterally to avoid collision.   Stairs             Wheelchair Mobility     Tilt Bed    Modified Rankin (Stroke Patients Only) Modified Rankin (Stroke Patients Only) Pre-Morbid Rankin Score: No symptoms Modified Rankin: Moderately severe disability     Balance Overall balance assessment: Needs assistance Sitting-balance support: Feet supported, Bilateral upper extremity supported Sitting balance-Leahy Scale: Fair Sitting balance - Comments: Able to use BUE to maintain sitting balance EOB   Standing balance support: Bilateral upper extremity supported, Reliant on assistive device for balance Standing balance-Leahy Scale: Fair Standing balance comment: Trunk flexion in stance and reliant on RW to maintain balance. Assist with steadying                            Communication Communication Communication: No apparent difficulties  Cognition Arousal: Alert  Behavior During Therapy: WFL for tasks assessed/performed   PT - Cognitive impairments:  Sequencing, Problem solving                         Following commands: Impaired Following commands impaired: Follows one step commands with increased time    Cueing Cueing Techniques: Verbal cues, Gestural cues, Tactile cues  Exercises      General Comments General comments (skin integrity, edema, etc.): VSS on RA      Pertinent Vitals/Pain Pain Assessment Pain Assessment: Faces Faces Pain Scale: Hurts little more Pain Location: head Pain Descriptors / Indicators: Headache Pain Intervention(s): Monitored during session, Limited activity within patient's tolerance, Premedicated before session    Home Living                          Prior Function            PT Goals (current goals can now be found in the care plan section) Acute Rehab PT Goals Patient Stated Goal: to improve PT Goal Formulation: With patient/family Time For Goal Achievement: 12/07/23 Potential to Achieve Goals: Good Progress towards PT goals: Progressing toward goals    Frequency    Min 2X/week      PT Plan      Co-evaluation              AM-PAC PT 6 Clicks Mobility   Outcome Measure  Help needed turning from your back to your side while in a flat bed without using bedrails?: A Little Help needed moving from lying on your back to sitting on the side of a flat bed without using bedrails?: A Little Help needed moving to and from a bed to a chair (including a wheelchair)?: A Lot Help needed standing up from a chair using your arms (e.g., wheelchair or bedside chair)?: A Lot Help needed to walk in hospital room?: A Lot Help needed climbing 3-5 steps with a railing? : Total 6 Click Score: 13    End of Session Equipment Utilized During Treatment: Gait belt Activity Tolerance: Patient limited by fatigue;Patient limited by pain Patient left: in chair;with chair alarm set;with call bell/phone within reach;with family/visitor present Nurse Communication: Mobility  status PT Visit Diagnosis: Unsteadiness on feet (R26.81);Other abnormalities of gait and mobility (R26.89);Pain     Time: 9096-9066 PT Time Calculation (min) (ACUTE ONLY): 30 min  Charges:    $Gait Training: 8-22 mins $Therapeutic Activity: 8-22 mins PT General Charges $$ ACUTE PT VISIT: 1 Visit                     Quintin Campi, SPT  Acute Rehab  [phone]    Quintin Campi 12/05/2023, 11:29 AM

## 2023-12-05 NOTE — Progress Notes (Signed)
  NEUROSURGERY PROGRESS NOTE   Doing well this am, no significant complaints. Ambulating in hallway with PT.   EXAM:  BP (!) 101/53 (BP Location: Right Arm)   Pulse 66   Temp 97.8 F (36.6 C) (Oral)   Resp 18   Ht 5' 7 (1.702 m)   Wt 85 kg   LMP  (LMP Unknown)   SpO2 100%   BMI 29.35 kg/m   Awake, alert Speech fluent CN grossly intact  Good strength throughout, no drift Right groin site soft EVD in place, clamped   TCD: Date POD PCO2 HCT BP   MCA ACA PCA OPHT SIPH VERT Basilar  7/14 GC         Right  Left   50  51          25  22   14  17   22   *   *  *   *       7/16 JH       39 117/65  Right  Left    60   62    -18   -40    37   39    19   14    20    43    -54   -67    -51       7/18  JH     38.9   113/65 Right  Left    70   63    -38   -34    35   30    15   14     38   33    -33   -34    -52       7/21 JH      38.9  133/62  Right  Left    132   137    -31   -20    39   54    23   17    54   34    -63   -81    -59        IMPRESSION:  63 y.o. female SAH d# 13, CTA negative and angio negative. HCP resolved, doing well after EVD removal  PLAN: - Nimotop  60q4 to complete 21d - Cont PT/OT/SLP - TCD today - Begin dispo planning, possible CIR   Gerldine Maizes, MD [facility]

## 2023-12-05 NOTE — TOC Progression Note (Signed)
 Transition of Care [facility]) - Progression Note    Patient Details  Name: Andrea Bridges MRN: [id number] Date of Birth: 01-23-1961  [facility] CM/SW Contact  Inocente GORMAN Kindle, LCSW Phone Number: 12/05/2023, 10:18 AM  Clinical Narrative:    Continuing to follow for needs.     Expected Discharge Plan: IP Rehab Facility Barriers to Discharge: English as a second language teacher, Continued Medical Work up               Expected Discharge Plan and Services   Discharge Planning Services: CM Consult Post Acute Care Choice: NA Living arrangements for the past 2 months: Single Family Home                 DME Arranged: N/A         HH Arranged: NA           Social Drivers of Health (SDOH) Interventions SDOH Screenings   Food Insecurity: Patient Unable To Answer (11/20/2023)  Housing: Patient Unable To Answer (11/20/2023)  Transportation Needs: Patient Unable To Answer (11/20/2023)  Utilities: Patient Unable To Answer (11/20/2023)  Depression (PHQ2-9): Low Risk  (09/22/2023)  Social Connections: Unknown (09/22/2021)   Received from [facility]  Tobacco Use: High Risk (12/02/2023)    Readmission Risk Interventions    11/21/2023    4:34 PM  Readmission Risk Prevention Plan  Post Dischage Appt Complete  Medication Screening Complete  Transportation Screening Complete

## 2023-12-05 NOTE — Progress Notes (Signed)

## 2023-12-05 NOTE — Progress Notes (Signed)
 Speech Language Pathology Treatment: Cognitive-Linguistic  Patient Details Name: Andrea Bridges MRN: [id number] DOB: 1960-08-29 Today's Date: 12/05/2023 Time: 8359-8342 SLP Time Calculation (min) (ACUTE ONLY): 17 min  Assessment / Plan / Recommendation Clinical Impression  Pt continues to report a headache but was overall more alert and able to participate. She independently voices wants/needs to her family, who are extremely supportive of her goals. She is 100% intelligible at the sentence level. Administered portions of the Cognistat with pt demonstrating difficulty primarily with memory. The portions related to calculations and reasoning were deferred due to increasing drowsiness throughout the session. She exhibits limited awareness of errors. Pt's family reports some general disorientation but with increasing ability to reorient herself when prompted to use environmental cueing, which was observed today. Discussed goal to work on functional tasks with higher intensity given apparent improvement related to level of alertness. Will continue following.    HPI HPI: Patient is a 64 y.o. female with PMH: PCOS, smoking/vaping, obsity, skin CA, anxiety. She presented to the hospital on 11/20/23 with acute onset headache. Imaging revealed large SAH. Ventricular drain placed by neurosurgery. She was intubated 7/13 and extubated 7/14 to supplemental oxygen via [place] and currently on RA.      SLP Plan  Continue with current plan of care          Recommendations                         Frequent or constant Supervision/Assistance Cognitive communication deficit (M58.158)     Continue with current plan of care     Damien Blumenthal, M.A., CCC-SLP Speech Language Pathology, Acute Rehabilitation Services  Secure Chat preferred [phone]   12/05/2023, 5:05 PM

## 2023-12-05 NOTE — Progress Notes (Signed)
 Nutrition Follow-up  DOCUMENTATION CODES:  Not applicable  INTERVENTION:   Encouraged PO intake at meals and supplements as needed  Discussed importance of adequate nutrition to preserve lean body mass  Ensure Plus High Protein po BID, each supplement provides 350 kcal and 20 grams of protein.- pt prefers chocolate   NUTRITION DIAGNOSIS:  Inadequate oral intake related to decreased appetite as evidenced by meal completion < 50%. Ongoing.   GOAL:  Patient will meet greater than or equal to 90% of their needs Progressing with PO diet and ONS   MONITOR:  PO intake, Supplement acceptance  REASON FOR ASSESSMENT:  Ventilator   ASSESSMENT:  63 y.o. female presented to the ED with headache. PMH includes anxiety and PCOS. Pt admitted with SAH.  Pt discussed during ICU rounds and with RN and MD.  [place] with pt who is much more alert and awake this morning. Pt had ate bacon and pancakes this morning.  Pt reports she can tell she is weaker that PTA after being in the bed.   7/13 - Admitted; Op s/p R frontal ventricular catheter placement 7/14 - Extubated; Cortrak placed 7/16 - diet advanced to Regular 7/28 - cortrak d/c'ed  Medications reviewed and include:  nimotop , protonix   Labs reviewed:  Na 133    Diet Order:   Diet Order             Diet regular Room service appropriate? Yes; Fluid consistency: Thin  Diet effective now                  EDUCATION NEEDS: Education needs have been addressed  Skin:  Skin Assessment: Reviewed RN Assessment  Last BM:  7/27 x 4 (medium/large) type 7  Height:  Ht Readings from Last 1 Encounters:  11/21/23 5' 7 (1.702 m)   Weight:  Wt Readings from Last 1 Encounters:  12/05/23 85 kg   Ideal Body Weight:  61.4 kg  BMI:  Body mass index is 29.35 kg/m.  Estimated Nutritional Needs:  Kcal:  1900-2100 Protein:  95-115 grams Fluid:  >/= 1.9 L   Gerardo Territo P., RD, LDN, CNSC See AMiON for contact information

## 2023-12-05 NOTE — Progress Notes (Signed)
 NAME:  Andrea Bridges, MRN:  [id number], DOB:  July 07, 1960, LOS: 15 ADMISSION DATE:  11/20/2023, CONSULTATION DATE:  11/20/23 REFERRING MD:  EDP, CHIEF COMPLAINT:  headache   History of Present Illness:  63 year old woman w/ hx of smoking, obesity presenting with headache.  Workup reveals large subarachnoid hemorrhage.  CTA neg but V4 of both vertebrals and distal basilar artery taper off concerning for possible early vasospasm and making full eval of this area difficult.  NSGY coming in to consider drain.  Intermittent purposeful movement and occasionally says ow to nursing.  PCCM to admit.  Significant Hospital Events: Including procedures, antibiotic start and stop dates in addition to other pertinent events   11/20/23 admit, EVD placement 7/13 CT head>>  1. Diffuse basilar subarachnoid hemorrhage with intraventricular extension and early ventriculomegaly, greatest along the left cerebellopontine angle cistern, possibly indicating the site of origin. 7/14 EEG>>This study is suggestive of moderate to severe diffuse encephalopathy, nonspecific etiology but likely related to sedation. No seizures or epileptiform discharges were seen throughout the recording. Extubated. 7/15 limited angiogram- no aneurysms, AVMs, or fistulas. CVC& aline removed. 7/21 new EVD placed overnight early AM due to malfunction of old catheter. TCD's with increased bilateral MCA velocities but no clinical correlate. 7/23 angiogram completed-no intracranial aneurysms/malformations/high flow fistulas noted.  Mild to moderate vasospasm  7/24 EVD clamped  7/26 EVD dc'd 7/28: take out cortrak, transfer to [facility] if NSGY okays  Interim History / Subjective:  On room air, diarrhea improved, eating more. Still with waxing/waning confusion. Will ask nsgy about transferring out since she's on nimotop    Objective    Blood pressure 110/71, pulse 67, temperature 98.2 F (36.8 C), temperature source Oral, resp. rate (!) 21, height 5' 7  (1.702 m), weight 85 kg, SpO2 99%.        Intake/Output Summary (Last 24 hours) at 12/05/2023 0724 Last data filed at 12/05/2023 0530 Gross per 24 hour  Intake 374.66 ml  Output 2760 ml  Net -2385.34 ml   Filed Weights   12/03/23 0500 12/04/23 0327 12/05/23 0500  Weight: 85.7 kg 86.7 kg 85 kg    Examination:  General: Acute on chronically -year-old female, lying in bed, NAD HEENT: Normocephalic, surgical site clean/intact, PERRLA intact, pink MM CV: S1, S2 RRR, no MRG, no JVD Pulm: CTAB, room air, resp even and unlabored  ABS: cortrak, +BS, soft  Extremities: No edema, no deformity Neuro: awake, alert, non focal exam, intermittent confusion or at times expressive aphasia  GU: Foley in place, yellow urine  Urine culture: E.coli EEG: mild diffuse encephalopathy CSF culture: , NGTD   Labs show mild hyponatremia Head CT 7/26 shows no hydrocephalus, decreased SAH and IVH  Assessment and Plan   Nontraumatic SAH with hydrocephalus s/p EVD 7/13 with replacement 7/21 early AM due to malfunctioning old EVD Headache 7/22 Cerebral angiogram >>no intracranial aneurysms/malformations/high flow fistulas noted.  Mild to moderate vasospasm involving proximal segments of anterior/middle cerebral arteries B/L - EVD out 7/27 P: Neurosurgery following Continue Nimotop  Continue topiramate  25mg  BID Continue serial neuroexams Continue neuroprotective measures Some confusion persistently, may be related to ICU delirium at this point  Hyperglycemia- controlled P: Cbg q4h  - ssi prn if needed    Fevers associated with encephalopathy - CSF cultures NGTD.  E.coli UTI (resistant to Ampicillin, intermediate to Unasyn). 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 P: - IVF d/c'd today  - avoiding hypotonic fluids,  restrict free water at this time  - Salt tabs 2g TID  Dysphagia, poor PO intake Diarrhea due to tube feeds P: Starting to eat more, has an appetite  - d/c cortrak today  - ongoing SLP needs, appreciate assistance   Urinary retention -Foley replaced multiple times, could be medication related  P:  Continue Flomax  and bethanechol  for now - try to remove foley 7/29  - if she fails again may need urology input   H/o tobaco abuse P: Continue counseling regarding importance of quitting Continue nicotine  patch Will need referral for lung cancer screening-recommendation at discharge  Best Practice (right click and Reselect all SmartList Selections daily)   Diet/type: tubefeeds DVT prophylaxis lovenox  Pressure ulcer(s): N/A GI prophylaxis: PPI Lines: N/A EVD Foley:  needed Code Status:  full code Last date of multidisciplinary goals of care discussion [ex husband updated at bedside on 7/26   CC time: na Tinnie FORBES Adolph DEVONNA [facility] 12/05/23 8:32 AM  Please see [email or web address] for pager details.  From 7A-7P if no response, please call [phone] After hours, please call ELink [phone]

## 2023-12-05 NOTE — Progress Notes (Signed)
 SLP Cancellation Note  Patient Details Name: Andrea Bridges MRN: [id number] DOB: 11-May-1960   Cancelled treatment:       Reason Eval/Treat Not Completed: New orders received for swallowing. Discussed with RN, who denies concern with pt's swallowing on a regular diet with thin liquids. SLP will continue following for cognitive-linguistic goals but ongoing f/u for swallowing is not clinically indicated at this time.    Damien Blumenthal, M.A., CCC-SLP Speech Language Pathology, Acute Rehabilitation Services  Secure Chat preferred [phone]  12/05/2023, 2:25 PM

## 2023-12-05 NOTE — Progress Notes (Signed)
 Inpatient Rehab Coordinator Note:  I spoke with patient's daughter and ex-spouse over the phone to discuss CIR recommendations and goals/expectations of CIR stay.  We reviewed 3 hrs/day of therapy, physician follow up, and average length of stay 2 weeks (dependent upon progress) with goals of supervision 24/7 at discharge.  I do think a good starting estimate for her LOS will be right around 2 weeks, and pt's daughter going back to college around the same time.  They're going to try to pull together some supervision for discharge and I will f/u.    Reche Lowers, PT, DPT Admissions Coordinator [phone] 12/05/23  4:32 PM

## 2023-12-05 NOTE — Progress Notes (Signed)
 Inpatient Rehab Admissions Coordinator:    Following for CIR.  Left message for daughter to discuss caregiver support.   Reche Lowers, PT, DPT Admissions Coordinator [phone] 12/05/23  3:35 PM

## 2023-12-06 ENCOUNTER — Inpatient Hospital Stay (HOSPITAL_COMMUNITY)

## 2023-12-06 DIAGNOSIS — R739 Hyperglycemia, unspecified: Secondary | ICD-10-CM | POA: Diagnosis not present

## 2023-12-06 DIAGNOSIS — N390 Urinary tract infection, site not specified: Secondary | ICD-10-CM | POA: Diagnosis not present

## 2023-12-06 DIAGNOSIS — I609 Nontraumatic subarachnoid hemorrhage, unspecified: Secondary | ICD-10-CM | POA: Diagnosis not present

## 2023-12-06 DIAGNOSIS — E871 Hypo-osmolality and hyponatremia: Secondary | ICD-10-CM | POA: Diagnosis not present

## 2023-12-06 LAB — COMPREHENSIVE METABOLIC PANEL WITH GFR
ALT: 35 U/L (ref 0–44)
AST: 27 U/L (ref 15–41)
Albumin: 3.3 g/dL — ABNORMAL LOW (ref 3.5–5.0)
Alkaline Phosphatase: 81 U/L (ref 38–126)
Anion gap: 14 (ref 5–15)
BUN: 21 mg/dL (ref 8–23)
CO2: 17 mmol/L — ABNORMAL LOW (ref 22–32)
Calcium: 9 mg/dL (ref 8.9–10.3)
Chloride: 100 mmol/L (ref 98–111)
Creatinine, Ser: 0.65 mg/dL (ref 0.44–1.00)
GFR, Estimated: >60 mL/min (ref >60)
Glucose, Bld: 144 mg/dL — ABNORMAL HIGH (ref 70–99)
Potassium: 3.5 mmol/L (ref 3.5–5.1)
Sodium: 131 mmol/L — ABNORMAL LOW (ref 135–145)
Total Bilirubin: 0.7 mg/dL (ref 0.0–1.2)
Total Protein: 6.8 g/dL (ref 6.5–8.1)

## 2023-12-06 LAB — CBC
HCT: 36.8 % (ref 36.0–46.0)
Hemoglobin: 12.7 g/dL (ref 12.0–15.0)
MCH: 27.9 pg (ref 26.0–34.0)
MCHC: 34.5 g/dL (ref 30.0–36.0)
MCV: 80.7 fL (ref 80.0–100.0)
Platelets: 442 K/uL — ABNORMAL HIGH (ref 150–400)
RBC: 4.56 MIL/uL (ref 3.87–5.11)
RDW: 14.6 % (ref 11.5–15.5)
WBC: 15.3 K/uL — ABNORMAL HIGH (ref 4.0–10.5)
nRBC: 0 % (ref 0.0–0.2)

## 2023-12-06 LAB — RENAL FUNCTION PANEL
Albumin: 3.1 g/dL — ABNORMAL LOW (ref 3.5–5.0)
Anion gap: 11 (ref 5–15)
BUN: 19 mg/dL (ref 8–23)
CO2: 23 mmol/L (ref 22–32)
Calcium: 9.4 mg/dL (ref 8.9–10.3)
Chloride: 103 mmol/L (ref 98–111)
Creatinine, Ser: 0.73 mg/dL (ref 0.44–1.00)
GFR, Estimated: >60 mL/min (ref >60)
Glucose, Bld: 101 mg/dL — ABNORMAL HIGH (ref 70–99)
Phosphorus: 3.4 mg/dL (ref 2.5–4.6)
Potassium: 3.7 mmol/L (ref 3.5–5.1)
Sodium: 137 mmol/L (ref 135–145)

## 2023-12-06 LAB — URINALYSIS, W/ REFLEX TO CULTURE (INFECTION SUSPECTED)
Bilirubin Urine: NEGATIVE
Glucose, UA: NEGATIVE mg/dL
Hgb urine dipstick: NEGATIVE
Ketones, ur: NEGATIVE mg/dL
Leukocytes,Ua: NEGATIVE
Nitrite: POSITIVE — AB
Protein, ur: NEGATIVE mg/dL
Specific Gravity, Urine: 1.019 (ref 1.005–1.030)
pH: 5 (ref 5.0–8.0)

## 2023-12-06 LAB — GLUCOSE, CAPILLARY
Glucose-Capillary: 106 mg/dL — ABNORMAL HIGH (ref 70–99)
Glucose-Capillary: 116 mg/dL — ABNORMAL HIGH (ref 70–99)
Glucose-Capillary: 123 mg/dL — ABNORMAL HIGH (ref 70–99)
Glucose-Capillary: 144 mg/dL — ABNORMAL HIGH (ref 70–99)
Glucose-Capillary: 148 mg/dL — ABNORMAL HIGH (ref 70–99)
Glucose-Capillary: 200 mg/dL — ABNORMAL HIGH (ref 70–99)

## 2023-12-06 MED ORDER — NIMODIPINE 6 MG/ML PO SOLN
60.0000 mg | ORAL | Status: DC
  Administered 2023-12-06 – 2023-12-09 (×16): 60 mg via ORAL
  Filled 2023-12-06 (×22): qty 10

## 2023-12-06 MED ORDER — LEVETIRACETAM (KEPPRA) 500 MG/5 ML ADULT IV PUSH
1000.0000 mg | Freq: Once | INTRAVENOUS | Status: AC
  Administered 2023-12-06: 1000 mg via INTRAVENOUS
  Filled 2023-12-06: qty 10

## 2023-12-06 MED ORDER — LEVETIRACETAM 500 MG PO TABS
500.0000 mg | ORAL_TABLET | Freq: Two times a day (BID) | ORAL | Status: DC
  Administered 2023-12-06 – 2023-12-09 (×6): 500 mg via ORAL
  Filled 2023-12-06 (×6): qty 1

## 2023-12-06 MED ORDER — NIMODIPINE 6 MG/ML PO SOLN
60.0000 mg | ORAL | Status: AC
  Administered 2023-12-06: 60 mg via ORAL
  Filled 2023-12-06: qty 10

## 2023-12-06 NOTE — Plan of Care (Signed)
  Problem: Education: Goal: Knowledge of disease or condition will improve Outcome: Progressing   Problem: Spontaneous Subarachnoid Hemorrhage Tissue Perfusion: Goal: Complications of Spontaneous Subarachnoid Hemorrhage will be minimized Outcome: Progressing   Problem: Coping: Goal: Will verbalize positive feelings about self Outcome: Progressing   Problem: Self-Care: Goal: Ability to communicate needs accurately will improve Outcome: Progressing   Problem: Self-Care: Goal: Ability to participate in self-care as condition permits will improve Outcome: Progressing   Problem: Skin Integrity: Goal: Risk for impaired skin integrity will decrease Outcome: Progressing   Problem: Safety: Goal: Ability to remain free from injury will improve Outcome: Progressing   Problem: Skin Integrity: Goal: Risk for impaired skin integrity will decrease Outcome: Progressing

## 2023-12-06 NOTE — Progress Notes (Signed)
 At bedside report, pt was asleep and nurse said she's had a headache and requested Tylenol  at 0600 and was given Nimodipine  and she said she was sleepy.  During bedside report, pt was asleep and did awaken and said hi to this nurse.  Around 0830 spouse said she was just so sleepy and was not acting right.  Upon my asssessment, she was not talking but was following commands minimally.  She was gripping bedrail and would not participate in assessment.  Started retching like she was going to vomit.  I did notify Dr Elpidio and also paged Neuro surgery to report.  Although she did not participate in fully completing the NIHSS, I was able to score her a 12.  I did page stroke response/rapid response and also MD ordered a stat head CT.   I did explain that she was not fully participating in the NIHSS.  They did assist to get her to CT and back to room.  MD call bacl to confirm that the CT was unremarkable and an EEG was ordered.  Since back to room, she is more alert and starting to answer and follow some simple commands.  She did have ice packs placed in her groin and axilla because she did have a rectal temp of 102.1.  Because she keeps falling asleep and does not stay awake long enough to complete the [facility] swallow, will keep her NPO until she is more alert and able to participate.

## 2023-12-06 NOTE — Progress Notes (Signed)
 Andrea Bridges is a 63 yr old inpatient on 32 W 11, day 62 SAH, angio neg. She was LKW today at 0600, asked RN for Tylenol  for a H/A. At  approx 0900, RN noted that pt was altered, not verbalizing. RRN and SRN called to assist. On Lovenox  prophylaxis.  BP, CBG unremarkable. NIHSS 5. Pt with aphasia, hot to touch. Drs Elpidio and Lanis notified of pt's exam. Stat CTNC obtained. Per Dr Lanis, CT unchanged. EEG ordered. Pt febrile 102 R. Ice packs placed.  Per Dr. Lanis, ok to leave pt on 3 W for now, checks per routine. [facility] notified of temperature. Ice packs placed on pt.   Plan: VS, NIHSS per routine          Recheck [facility] before giving any POs          Stat EEG          Contact SRN if additional support needed  Handoff with RN Sonya complete.  Richardson Said RN  Stroke Response [phone]

## 2023-12-06 NOTE — Plan of Care (Signed)
  Problem: Education: Goal: Knowledge of secondary prevention will improve (MUST DOCUMENT ALL) 12/06/2023 2042 by Karlene Grayce GRADE, RN Outcome: Progressing 12/06/2023 1444 by Karlene Grayce GRADE, RN Outcome: Progressing   Problem: Spontaneous Subarachnoid Hemorrhage Tissue Perfusion: Goal: Complications of Spontaneous Subarachnoid Hemorrhage will be minimized 12/06/2023 2042 by Karlene Grayce GRADE, RN Outcome: Progressing 12/06/2023 1444 by Karlene Grayce GRADE, RN Outcome: Progressing   Problem: Coping: Goal: Will verbalize positive feelings about self 12/06/2023 2042 by Karlene Grayce GRADE, RN Outcome: Progressing 12/06/2023 1444 by Karlene Grayce GRADE, RN Outcome: Progressing   Problem: Self-Care: Goal: Ability to participate in self-care as condition permits will improve 12/06/2023 2042 by Karlene Grayce GRADE, RN Outcome: Progressing 12/06/2023 1444 by Karlene Grayce GRADE, RN Outcome: Progressing   Problem: Nutrition: Goal: Risk of aspiration will decrease 12/06/2023 2042 by Karlene Grayce GRADE, RN Outcome: Progressing 12/06/2023 1444 by Karlene Grayce GRADE, RN Outcome: Progressing   Problem: Nutrition: Goal: Dietary intake will improve 12/06/2023 2042 by Karlene Grayce GRADE, RN Outcome: Progressing 12/06/2023 1444 by Karlene Grayce GRADE, RN Outcome: Progressing   Problem: Coping: Goal: Ability to adjust to condition or change in health will improve 12/06/2023 2042 by Karlene Grayce GRADE, RN Outcome: Progressing 12/06/2023 1444 by Karlene Grayce GRADE, RN Outcome: Progressing   Problem: Health Behavior/Discharge Planning: Goal: Ability to manage health-related needs will improve 12/06/2023 2042 by Karlene Grayce GRADE, RN Outcome: Progressing 12/06/2023 1444 by Karlene Grayce GRADE, RN Outcome: Progressing   Problem: Skin Integrity: Goal: Risk for impaired skin integrity will decrease 12/06/2023 2042 by Karlene Grayce GRADE, RN Outcome: Progressing 12/06/2023 1444 by Karlene Grayce GRADE, RN Outcome: Progressing   Problem:  Nutritional: Goal: Maintenance of adequate nutrition will improve 12/06/2023 2042 by Karlene Grayce GRADE, RN Outcome: Progressing 12/06/2023 1444 by Karlene Grayce GRADE, RN Outcome: Progressing

## 2023-12-06 NOTE — Consult Note (Signed)
 Physical Medicine and Rehabilitation Consult Reason for Consult:Rehab Referring Physician: Dr. Burlene   HPI: Andrea Bridges is a 63 y.o. female with PMH of  PCOS, tobacco use, anxiety who presented with headache and altered mental status.  CT head showed diffuse basilar subarachnoid hemorrhage with intraventricular extension and early ventriculomegaly.  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  Patient had right frontal ventricular catheter placed by Dr. Gillie on 11/20/2023.  7/15 had limited angiogram completed that did not show aneurysms, AVMs or fistulas.  Patient required replacement of the right frontal ventricular catheter due to prior catheter becoming nonfunctional.  Ventricular catheter was removed on 7/26.  Patient noted to have waxing and waning confusion.  Patient was treated for UTI.  She also had urinary retention requiring Foley catheter.  Core track was discontinued, following with SLP.  Patient seen by PT and OT and found to have functional deficits.  Yesterday patient was able to ambulate with min assist, sit to stand mod assist.  Today patient was noted to have convulsing episodes by son and was initially documented as unresponsive not following commands.  She has been less responsive since this time.  Neurosurgery ordered EEG and started Keppra .  EEG did not show seizures or epileptiform discharges.  Patient lives in a [place] home with 2 steps to enter.  He is going to college on 8/15 daughter but family is working on other people to possibly assist her.  She could also stay with her brother who lives about 2 hours away but could provide 24/7 assistance.  Home: Home Living Family/patient expects to be discharged to:: Private residence Living Arrangements: Children Available Help at Discharge: Family,  Friend(s), Available PRN/intermittently Type of Home: House Home Access: Stairs to enter [employer] of Steps: one step down, two steps up Entrance Stairs-Rails: None Home Layout: One level Bathroom Shower/Tub: Psychologist, counselling, Hydrographic surveyor, Sport and exercise psychologist: Standard Home Equipment: None  Lives With: Spouse  Functional History: Prior Function Prior Level of Function : Independent/Modified Independent, Working/employed, Driving Mobility Comments: indep, no AD ADLs Comments: indep, works full time Functional Status:  Mobility: Bed Mobility Overal bed mobility: Needs Assistance Bed Mobility: Rolling, Sidelying to Sit Rolling: Mod assist, Used rails Sidelying to sit: Mod assist Supine to sit: Mod assist, Max assist Sit to supine: Contact guard assist Sit to sidelying: Max assist General bed mobility comments: VC for sequencing and assist for LE management. Pt initiate w/ LE but needs additional time Transfers Overall transfer level: Needs assistance Equipment used: Rolling walker (2 wheels) Transfers: Sit to/from Stand Sit to Stand: Mod assist, +2 safety/equipment Bed to/from chair/wheelchair/BSC transfer type:: Step pivot Step pivot transfers: Mod assist, +2 safety/equipment General transfer comment: Assist for power up and steadying. VC for hand placement for walker management. Ambulation/Gait Ambulation/Gait assistance: +2 safety/equipment, Min assist Gait Distance (Feet): 70 Feet Assistive device: Rolling walker (2 wheels) Gait Pattern/deviations: Step-through pattern, Decreased stride length, Trunk flexed, Drifts right/left General Gait Details: VC to avoid colliding w/ obstacles, upright, and staying inside walker. Assist with walker management when drifting laterally to avoid collision. Gait velocity: Decreased Gait velocity interpretation: <1.8 ft/sec, indicate of risk for recurrent falls Pre-gait activities: Marching in place w/ RW     ADL: ADL Overall ADL's : Needs assistance/impaired Eating/Feeding: Independent Grooming: Minimal assistance, Sitting Upper Body Bathing: Moderate assistance, Sitting Lower  Body Bathing: Maximal assistance, Sit to/from stand Upper Body Dressing : Maximal assistance, Sitting Lower Body Dressing: Maximal assistance, Sit to/from stand Lower Body Dressing Details (indicate cue type and reason): pt able to don socks at the EOB Toilet Transfer: Minimal assistance, Stand-pivot, BSC/3in1, Rolling walker (2 wheels) Toileting- Clothing Manipulation and Hygiene: Total assistance, Sit to/from stand Functional mobility during ADLs: Supervision/safety General ADL Comments: limited by HA  Cognition: Cognition Overall Cognitive Status: Impaired/Different from baseline Arousal/Alertness: Awake/alert Orientation Level:  (UTA) Year: 2025 Month: July Day of Week: Incorrect Attention: Sustained Sustained Attention: Impaired Sustained Attention Impairment: Verbal basic, Verbal complex Memory: Impaired Memory Impairment: Storage deficit, Retrieval deficit Awareness: Impaired Awareness Impairment: Intellectual impairment, Emergent impairment Problem Solving: Impaired Problem Solving Impairment: Verbal complex, Functional complex Executive Function: Self Correcting Initiating: Impaired Initiating Impairment: Verbal basic, Verbal complex Self Correcting: Impaired Self Correcting Impairment: Functional complex, Functional basic, Verbal complex Safety/Judgment: Impaired Comments: patient requiring cues to refrain from scratching head at surgical site and from pulling on Cortrak feeding tube Cognition Arousal: Alert Behavior During Therapy: [facility] for tasks assessed/performed Overall Cognitive Status: Impaired/Different from baseline   Review of Systems  Reason unable to perform ROS: Limited by AMS.   Past Medical History:  Diagnosis Date   Acute appendicitis with perforation and localized  peritonitis, without abscess or gangrene 12/11/2019   Anxiety    Infertility, female    PCOS (polycystic ovarian syndrome)    Skin cancer    basal cell   SOBOE (shortness of breath on exertion) 09/08/2022   Past Surgical History:  Procedure Laterality Date   APPENDECTOMY  2021   BREAST BIOPSY     IR ANGIO INTRA EXTRACRAN SEL INTERNAL CAROTID BILAT MOD SED  11/29/2023   IR ANGIO VERTEBRAL SEL VERTEBRAL BILAT MOD SED  11/29/2023   IR ANGIO VERTEBRAL SEL VERTEBRAL UNI R MOD SED  11/22/2023   IR US  GUIDE VASC ACCESS RIGHT  11/29/2023   Family History  Problem Relation Age of Onset   Thyroid  disease Mother    Anxiety disorder Mother    Obesity Mother    Cancer Father        skin   Sleep apnea Father    Social History:  reports that she has been smoking cigarettes and e-cigarettes. She has never used smokeless tobacco. She reports that she does not currently use alcohol. She reports that she does not use drugs. Allergies: No Known Allergies Medications Prior to Admission  Medication Sig Dispense Refill   ibuprofen (ADVIL) 200 MG tablet Take 400 mg by mouth every 6 (six) hours as needed for moderate pain (pain score 4-6).     Semaglutide -Weight Management (WEGOVY ) 1.7 MG/0.75ML SOAJ Inject 1.7 mg into the skin once a week. 3 mL 1   Vitamin D , Ergocalciferol , (DRISDOL ) 1.25 MG (50000 UNIT) CAPS capsule Take 1 capsule (50,000 Units total) by mouth every 7 (seven) days. 12 capsule 0     Blood pressure (!) 132/117, pulse (!) 56, temperature (!) 102.1 F (38.9 C), temperature source Rectal, resp. rate (!) 53, height 5' 7 (1.702 m), weight 85.3 kg, SpO2 98%. Physical Exam  General: No apparent distress, laying in bed HEENT: Cranial incision with staples intact, CDI.  MMM Neck: Supple without JVD or lymphadenopathy Heart: Reg rate and rhythm. No murmurs rubs or gallops Chest: CTA bilaterally without wheezes, rales, or rhonchi; no distress Abdomen: Soft, non-tender, non-distended, bowel  sounds positive. Extremities: No clubbing, cyanosis, or edema. Pulses are 2+ Psych: Flat Skin: Clean  and intact without signs of breakdown GU: Foley with clear yellow urine Neuro:    Mental Status: Alert, oriented only to person not answering other orientation questions, able to give her daughter's name.  Intermittently following commands. Speech/Languate: Delayed responses.  Does not respond to most questions today.  Occasionally answering in one-word answers.  Hypophonia. CRANIAL NERVES: Pupils reactive, EOMI, no facial weakness noted, tongue midline  MOTOR: Bilateral upper extremities at least 4 out of 5 Unable to follow commands for lower extremity MMT, moving both legs spontaneously   REFLEXES: DTR 2+ patella and Achilles bilaterally, sustained clonus left ankle, 2-3 beats of clonus right ankle  SENSORY: Normal to touch all 4 extremities    Results for orders placed or performed during the hospital encounter of 11/20/23 (from the past 24 hours)  Glucose, capillary     Status: Abnormal   Collection Time: 12/05/23 11:32 AM  Result Value Ref Range   Glucose-Capillary 131 (H) 70 - 99 mg/dL  Glucose, capillary     Status: Abnormal   Collection Time: 12/05/23  3:06 PM  Result Value Ref Range   Glucose-Capillary 141 (H) 70 - 99 mg/dL   Comment 1 Notify RN   Glucose, capillary     Status: Abnormal   Collection Time: 12/05/23  7:43 PM  Result Value Ref Range   Glucose-Capillary 153 (H) 70 - 99 mg/dL   Comment 1 Notify RN    Comment 2 Document in Chart   Glucose, capillary     Status: None   Collection Time: 12/05/23 11:10 PM  Result Value Ref Range   Glucose-Capillary 93 70 - 99 mg/dL   Comment 1 Notify RN    Comment 2 Document in Chart   Glucose, capillary     Status: Abnormal   Collection Time: 12/06/23  4:25 AM  Result Value Ref Range   Glucose-Capillary 106 (H) 70 - 99 mg/dL   Comment 1 Notify RN    Comment 2 Document in Chart   Renal function panel     Status:  Abnormal   Collection Time: 12/06/23  4:47 AM  Result Value Ref Range   Sodium 137 135 - 145 mmol/L   Potassium 3.7 3.5 - 5.1 mmol/L   Chloride 103 98 - 111 mmol/L   CO2 23 22 - 32 mmol/L   Glucose, Bld 101 (H) 70 - 99 mg/dL   BUN 19 8 - 23 mg/dL   Creatinine, Ser 9.26 0.44 - 1.00 mg/dL   Calcium 9.4 8.9 - 89.6 mg/dL   Phosphorus 3.4 2.5 - 4.6 mg/dL   Albumin 3.1 (L) 3.5 - 5.0 g/dL   GFR, Estimated >39 >39 mL/min   Anion gap 11 5 - 15  Glucose, capillary     Status: Abnormal   Collection Time: 12/06/23  9:04 AM  Result Value Ref Range   Glucose-Capillary 144 (H) 70 - 99 mg/dL   Comment 1 Notify RN    Comment 2 Document in Chart    CT HEAD WO CONTRAST ( ) Result Date: 12/06/2023 CLINICAL DATA:  Headache, sudden, severe EXAM: CT HEAD WITHOUT CONTRAST TECHNIQUE: Contiguous axial images were obtained from the base of the skull through the vertex without intravenous contrast. RADIATION DOSE REDUCTION: This exam was performed according to the departmental dose-optimization program which includes automated exposure control, adjustment of the mA and/or kV according to patient size and/or use of iterative reconstruction technique. COMPARISON:  CT the head dated December 03, 2023. FINDINGS: Brain: Since the previous study, a  right frontal approach ventriculostomy catheter has been removed. There is some residual hemorrhage along the tract. There has been interval near complete resolution of intraventricular hemorrhage. There has also been interval near complete resolution of subarachnoid hemorrhage. The ventricles and sulci are proportionate in size. There are dystrophic calcifications present anterior laterally within the left cerebellar hemisphere on image 8 of series 3. Vascular: Mild vascular calcifications. Skull: Right frontal burr craniotomy in defect. Otherwise, unremarkable. Sinuses/Orbits: Mild mucosal disease within the right maxillary sinus. The mastoid air cells are clear. Other: None.  IMPRESSION: 1. There is a cluster of dystrophic calcifications present anterior laterally within the left cerebellar hemisphere. Recommend correlation with an MRI of the brain without and with gadolinium contrast to exclude neoplasm. 2. Interval near total resolution of intraventricular hemorrhage and subarachnoid hemorrhage following removal of a right sided ventriculostomy catheter. There is mild residual hemorrhage along the tract. Electronically Signed   By: Evalene Coho M.D.   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 Yes Co-Morbidities requiring supervision/potential complications:  - Hyperglycemia, hyponatremia, UTI, fevers with encephalopathy, dysphagia, diarrhea, urinary retention, history of tobacco abuse Due to bladder management, bowel management, safety, skin/wound care, disease management, medication administration, pain management, and patient education, does the patient require 24 hr/day rehab nursing? Yes Does the patient require coordinated care of a physician, rehab nurse, therapy disciplines of PT, OT, SLP to address physical and functional deficits in the context of the above medical diagnosis(es)? Yes Addressing deficits in the following areas: balance, endurance, locomotion, strength, transferring, bowel/bladder control, bathing, dressing, feeding, grooming, toileting, cognition, speech, language, swallowing, and psychosocial support Can the patient actively participate in an intensive therapy program of at least 3 hrs of therapy per day at least 5 days per week? Yes The potential for patient to make measurable gains while on inpatient rehab is excellent Anticipated functional outcomes upon discharge from inpatient  rehab are supervision and min assist  with PT, supervision and min assist with OT, supervision and min assist with SLP. Estimated rehab length of stay to reach the above functional goals is: 10-14 Anticipated discharge destination: Home Overall Rehab/Functional Prognosis: excellent  POST ACUTE RECOMMENDATIONS: This patient's condition is appropriate for continued rehabilitative care in the following setting: CIR and SNF Patient has agreed to participate in recommended program. Potentially Note that insurance prior authorization may be required for reimbursement for recommended care.  Comment: I think patient would be a good candidate for CIR if able to confirm 24/7 assistance after discharge.  Patient potentially may be able to have friends watch her or she may be able to stay with her brother.  Rehab coordinator to follow-up.  Did have episode of cognitive worsening today, potentially seizure related that is currently being worked up.   I have personally performed a face to face diagnostic evaluation of this patient. Additionally, I have examined the patient's medical record including any pertinent labs and radiographic images.    Thanks,  Murray Collier, MD 12/06/2023

## 2023-12-06 NOTE — Plan of Care (Signed)
 Is much more alert and talkative than earlier this morning.  Does fall asleep easily but will awaken to answer questions and for assessments.  No signs of distress at this time.  Problem: Education: Goal: Knowledge of secondary prevention will improve (MUST DOCUMENT ALL) Outcome: Progressing   Problem: Spontaneous Subarachnoid Hemorrhage Tissue Perfusion: Goal: Complications of Spontaneous Subarachnoid Hemorrhage will be minimized Outcome: Progressing   Problem: Self-Care: Goal: Ability to participate in self-care as condition permits will improve Outcome: Progressing   Problem: Nutrition: Goal: Risk of aspiration will decrease Outcome: Progressing   Problem: Skin Integrity: Goal: Risk for impaired skin integrity will decrease Outcome: Progressing

## 2023-12-06 NOTE — Progress Notes (Signed)
  NEUROSURGERY PROGRESS NOTE   No issues overnight however this am called to see patient, per son had been convulsing upon his arrival at 0730 this am, has been unresponsive, not following commands. Rapid response at bedside.  EXAM:  BP 122/75 (BP Location: Left Arm)   Pulse 65   Temp 98.9 F (37.2 C) (Oral)   Resp 17   Ht 5' 7 (1.702 m)   Wt 85.3 kg   LMP  (LMP Unknown)   SpO2 98%   BMI 29.45 kg/m   Awake, alert Non-verbal Pupils reactive Not following commands but moving extremities spontaneously  IMAGING: CTH reviewed, compared to 7/26. No new hemorrhage, or HCP with stable small right frontal tract hematoma. No significant brain edema.  IMPRESSION:  63 y.o. female SAH d#16, angio negative with rather abrupt decline in MS, CT unchanged, without structural cause. ?SZ  PLAN: - Will get EEG - Can start Keppra  1000mg  x 1 and 500 mg BID\ - Will check routine CBC/BMP - Spoke with pts son and primary service about plan above   Gerldine Maizes, MD [facility]

## 2023-12-06 NOTE — Progress Notes (Signed)
STAT EEG complete. Results pending °

## 2023-12-06 NOTE — Progress Notes (Signed)
 PROGRESS NOTE  Andrea Bridges  FMW:[id number] DOB: [birth year] DOA: 11/20/2023 PCP: Catherine Charlies LABOR, DO  Consultants  Brief Narrative:  63 y.o. female with PMH of  PCOS, tobacco use, anxiety who presented with headache and altered mental status.  CT head showed diffuse basilar subarachnoid hemorrhage with intraventricular extension and early ventriculomegaly.  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  Patient had right frontal ventricular catheter placed by Dr. Gillie on 11/20/2023.  7/15 had limited angiogram completed that did not show aneurysms, AVMs or fistulas.  Patient required replacement of the right frontal ventricular catheter due to prior catheter becoming nonfunctional.  Ventricular catheter was removed on 7/26.  Patient noted to have waxing and waning confusion.  Patient was treated for UTI.  She also had urinary retention requiring Foley catheter.  Core track was discontinued, following with SLP.  Patient seen by PT and OT and found to have functional deficits.  Yesterday patient was able to ambulate with min assist, sit to stand mod assist.    This AM, patient had acute altered mental status and shaking episodes.  Nonresponsive not following commands.  Had stat CT scan which was negative and EEG done which showed mild to moderate diffuse encephalopathy and no seizures.  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  She has been started on Keppra . Continue Nimotop  Continue serial neuroexams Continue neuroprotective measures Now she is fairly sleepy.  Will awaken to voice but falls back asleep.   Hyperglycemia- controlled P: Cbg q4h  - ssi prn if needed    Fevers associated  with encephalopathy - CSF cultures NGTD.  E.coli UTI (resistant to Ampicillin, intermediate to Unasyn). Completed Ancef  for total of 7 days-Foley reinserted 7/25 DC Foley again today and voiding trial. Of note she did have a fever this morning rectally to 102 over that was right after her seizure. Blood cultures and urine cultures are pending. WBC elevated but again this could be secondary to seizure.  Will trend. 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 - IVF d/c'd 7/28 - avoiding hypotonic fluids, restrict free water at this time  - Salt tabs 2g TID   Dysphagia, poor PO intake Diarrhea due to tube feeds Starting to eat more, has an appetite  - d/c cortrak 7/28 - ongoing SLP needs, appreciate assistance  -Not eating as much today because of grogginess after seizure   Urinary retention -Foley replaced multiple times, could be medication related  Continue Flomax  and bethanechol  for now - Trial off Foley again 7/29 today - if she fails again may need urology input    H/o tobaco abuse P: Continue counseling regarding importance of quitting Continue nicotine  patch Will need referral for lung cancer screening-recommendation at discharge Nutrition Problem: Inadequate oral intake Etiology: decreased appetite Signs/Symptoms: meal completion < 50% Interventions: Ensure Enlive (each supplement provides 350kcal and 20 grams of protein)   DVT prophylaxis:  enoxaparin  (LOVENOX ) injection 40 mg Start: 11/22/23 1800 SCD's Start: 11/20/23 1640  Code Status:   Code Status: Full Code Family Communication: Ex-husband and daughter at bedside and all questions answered  Level of care: Telemetry Medical Status is: Inpatient   Subjective: Patient groggy today status post seizure.  See above for  details.  Objective: Vitals:   12/06/23 0932 12/06/23 0940 12/06/23 1040 12/06/23 1140  BP:  130/80 122/75 118/70  Pulse:  62 65 70  Resp: (!) 53  17 18  Temp: (!) 102.1 F (38.9 C) (!) 101 F (38.3 C) 98.9 F (37.2 C) 98.6 F (37 C)  TempSrc: Rectal Rectal Oral Oral  SpO2:  98% 98% 98%  Weight:      Height:        Intake/Output Summary (Last 24 hours) at 12/06/2023 1307 Last data filed at 12/06/2023 9092 Gross per 24 hour  Intake 120 ml  Output 2025 ml  Net -1905 ml   Filed Weights   12/04/23 0327 12/05/23 0500 12/06/23 0500  Weight: 86.7 kg 85 kg 85.3 kg   Body mass index is 29.45 kg/m.  Gen: 63 y.o. female in no apparent distress.  Nontoxic, lying in bed.  Will awaken to voice but then falls back asleep. Pulm: Non-labored breathing.  Clear to auscultation bilaterally.  CV: Regular rate and rhythm. No murmur, rub, or gallop. No JVD GI: Abdomen soft, non-tender, non-distended, with normoactive bowel sounds. No organomegaly or masses felt. Ext: Warm, no deformities Skin: No rashes, lesions  Neuro: Not alert today.  Will awaken to voice and track but then falls back asleep.  Did move all limbs symmetrically.   I have personally reviewed the following labs and images: CBC: Recent Labs  Lab 11/30/23 0558 12/01/23 0544 12/02/23 0643 12/03/23 0458 12/06/23 1054  WBC 12.2* 10.8* 12.5* 10.3 15.3*  HGB 12.4 11.8* 12.2 11.5* 12.7  HCT 34.8* 34.4* 35.1* 33.2* 36.8  MCV 79.5* 80.2 80.1 80.0 80.7  PLT 301 317 342 357 442*   BMP &GFR Recent Labs  Lab 11/30/23 0558 11/30/23 0559 12/01/23 0544 12/01/23 0545 12/02/23 0643 12/02/23 1649 12/03/23 0458 12/03/23 0459 12/04/23 0613 12/05/23 0624 12/06/23 0447 12/06/23 1054  NA  --    < >  --    < > 135   < >  --  134* 133* 133* 137 131*  K  --    < >  --    < > 3.9  --   --  3.6 4.5 3.6 3.7 3.5  CL  --    < >  --    < > 104  --   --  107 102 99 103 100  CO2  --    < >  --    < > 21*  --   --  21* 19* 23 23 17*   GLUCOSE  --    < >  --    < > 107*  --   --  113* 100* 99 101* 144*  BUN  --    < >  --    < > 23  --   --  19 14 15 19 21   CREATININE  --    < >  --    < > 0.58  --   --  0.52 0.55 0.62 0.73 0.65  CALCIUM  --    < >  --    < > 9.0  --   --  8.6* 9.1 9.4 9.4 9.0  MG 2.2  --  2.2  --   --   --  2.3  --   --   --   --   --  PHOS  --    < >  --    < > 2.5  --   --  2.5 3.1 3.9 3.4  --    < > = values in this interval not displayed.   Estimated Creatinine Clearance: 80.8 mL/min (by C-G formula based on SCr of 0.65 mg/dL). Liver & Pancreas: Recent Labs  Lab 12/03/23 0459 12/04/23 9386 12/05/23 0624 12/06/23 0447 12/06/23 1054  AST  --   --   --   --  27  ALT  --   --   --   --  35  ALKPHOS  --   --   --   --  81  BILITOT  --   --   --   --  0.7  PROT  --   --   --   --  6.8  ALBUMIN 2.7* 3.0* 2.9* 3.1* 3.3*   No results for input(s): LIPASE, AMYLASE in the last 168 hours. No results for input(s): AMMONIA in the last 168 hours. Diabetic: No results for input(s): HGBA1C in the last 72 hours. Recent Labs  Lab 12/05/23 1943 12/05/23 2310 12/06/23 0425 12/06/23 0904 12/06/23 1217  GLUCAP 153* 93 106* 144* 200*   Cardiac Enzymes: No results for input(s): CKTOTAL, CKMB, CKMBINDEX, TROPONINI in the last 168 hours. No results for input(s): PROBNP in the last 8760 hours. Coagulation Profile: No results for input(s): INR, PROTIME in the last 168 hours. Thyroid  Function Tests: No results for input(s): TSH, T4TOTAL, FREET4, T3FREE, THYROIDAB in the last 72 hours. Lipid Profile: No results for input(s): CHOL, HDL, LDLCALC, TRIG, CHOLHDL, LDLDIRECT in the last 72 hours. Anemia Panel: No results for input(s): VITAMINB12, FOLATE, FERRITIN, TIBC, IRON, RETICCTPCT in the last 72 hours. Urine analysis:    Component Value Date/Time   COLORURINE YELLOW 11/25/2023 0903   APPEARANCEUR HAZY (A) 11/25/2023 0903   LABSPEC 1.027  11/25/2023 0903   PHURINE 5.0 11/25/2023 0903   GLUCOSEU NEGATIVE 11/25/2023 0903   HGBUR NEGATIVE 11/25/2023 0903   BILIRUBINUR NEGATIVE 11/25/2023 0903   KETONESUR NEGATIVE 11/25/2023 0903   PROTEINUR NEGATIVE 11/25/2023 0903   NITRITE NEGATIVE 11/25/2023 0903   LEUKOCYTESUR SMALL (A) 11/25/2023 0903   Sepsis Labs: Invalid input(s): PROCALCITONIN, LACTICIDVEN  Microbiology: Recent Results (from the past 240 hours)  Culture, blood (Routine X 2) w Reflex to ID Panel     Status: None   Collection Time: 11/26/23  5:03 PM   Specimen: BLOOD LEFT HAND  Result Value Ref Range Status   Specimen Description BLOOD LEFT HAND  Final   Special Requests   Final    BOTTLES DRAWN AEROBIC AND ANAEROBIC Blood Culture results may not be optimal due to an inadequate volume of blood received in culture bottles   Culture   Final    NO GROWTH 5 DAYS Performed at [facility], [address] [place], [place] [address]    Report Status 12/01/2023 FINAL  Final  Culture, blood (Routine X 2) w Reflex to ID Panel     Status: None   Collection Time: 11/26/23  5:10 PM   Specimen: BLOOD LEFT ARM  Result Value Ref Range Status   Specimen Description BLOOD LEFT ARM  Final   Special Requests   Final    BOTTLES DRAWN AEROBIC AND ANAEROBIC Blood Culture results may not be optimal due to an inadequate volume of blood received in culture bottles   Culture   Final    NO GROWTH 5  DAYS Performed at [facility], [address] [place], [place] [address]    Report Status 12/01/2023 FINAL  Final  CSF culture w Gram Stain     Status: None   Collection Time: 11/27/23 10:01 AM   Specimen: CSF; Cerebrospinal Fluid  Result Value Ref Range Status   Specimen Description CSF  Final   Special Requests NONE  Final   Gram Stain NO WBC SEEN NO ORGANISMS SEEN   Final   Culture   Final    NO GROWTH 3 DAYS Performed at [facility], [address] [place], [place] [address]    Report Status  11/30/2023 FINAL  Final    Radiology Studies: EEG adult Result Date: 12/06/2023 Gregg Lek, MD     12/06/2023 11:22 AM [name] MRN: [id number] Epilepsy Attending: Lek Gregg Referring Physician/Provider: Date: 12/06/2023 Duration: 25 minutes  Patient history: 63 year old woman with  SAH, now with seizure like activity, EEG to rule out seizures Level of alertness: Awake, drowsy, sleep AEDs during EEG study: Technical aspects: This EEG study was done with scalp electrodes positioned according to the 10-20 International system of electrode placement. Electrical activity was reviewed with band pass filter of 1-70Hz , sensitivity of 7 uV/mm, display speed of 38mm/sec with a 60Hz  notched filter applied as appropriate. EEG data were recorded continuously and digitally stored.  Video monitoring was available and reviewed as appropriate. Description: The posterior dominant rhythm consists of 3-7 Hz activity of moderate voltage (25-35 uV) seen predominantly in posterior head regions, symmetric and reactive to eye opening and eye closing. Drowsiness was characterized by attenuation of the posterior background rhythm. Sleep was not seen. EEG showed continuous generalized polymorphic sharply contoured 3 to 6 Hz theta-delta slowing. Physiologic photic driving was not seen during photic stimulation.  Hyperventilation was not performed.   ABNORMALITY - Continuous slow, generalized IMPRESSION: This study is suggestive of mild to moderate diffuse encephalopathy, nonspecific etiology but likely related to sedation, toxic-metabolic etiology. No seizures or epileptiform discharges were seen throughout the recording. Lek Gregg MD Neurology    CT HEAD WO CONTRAST ( ) Result Date: 12/06/2023 CLINICAL DATA:  Headache, sudden, severe EXAM: CT HEAD WITHOUT CONTRAST TECHNIQUE: Contiguous axial images were obtained from the base of the skull through the vertex without intravenous contrast. RADIATION DOSE  REDUCTION: This exam was performed according to the departmental dose-optimization program which includes automated exposure control, adjustment of the mA and/or kV according to patient size and/or use of iterative reconstruction technique. COMPARISON:  CT the head dated December 03, 2023. FINDINGS: Brain: Since the previous study, a right frontal approach ventriculostomy catheter has been removed. There is some residual hemorrhage along the tract. There has been interval near complete resolution of intraventricular hemorrhage. There has also been interval near complete resolution of subarachnoid hemorrhage. The ventricles and sulci are proportionate in size. There are dystrophic calcifications present anterior laterally within the left cerebellar hemisphere on image 8 of series 3. Vascular: Mild vascular calcifications. Skull: Right frontal burr craniotomy in defect. Otherwise, unremarkable. Sinuses/Orbits: Mild mucosal disease within the right maxillary sinus. The mastoid air cells are clear. Other: None. IMPRESSION: 1. There is a cluster of dystrophic calcifications present anterior laterally within the left cerebellar hemisphere. Recommend correlation with an MRI of the brain without and with gadolinium contrast to exclude neoplasm. 2. Interval near total resolution of intraventricular hemorrhage and subarachnoid hemorrhage following removal of a right sided ventriculostomy catheter. There is mild residual hemorrhage  along the tract. Electronically Signed   By: Evalene Coho M.D.   On: 12/06/2023 09:54    Scheduled Meds:  bethanechol   10 mg Oral TID   Chlorhexidine  Gluconate Cloth  6 each Topical Daily   enoxaparin  (LOVENOX ) injection  40 mg Subcutaneous QPM   feeding supplement  237 mL Oral BID BM   fiber supplement (BANATROL TF)  60 mL Oral BID   levETIRAcetam   500 mg Oral BID   nicotine   14 mg Transdermal Daily   niMODipine   60 mg Oral Q4H   Or   niMODipine   60 mg Per Tube Q4H   mouth rinse  15  mL Mouth Rinse Q4H   pantoprazole   40 mg Oral Daily   sodium chloride   2 g Oral TID   tamsulosin   0.4 mg Oral Daily   topiramate   25 mg Oral BID   Continuous Infusions:  promethazine  (PHENERGAN ) injection (IM or IVPB) Stopped (11/23/23 1952)     LOS: 16 days   35 minutes with more than 50% spent in reviewing records, counseling patient/family and coordinating care.  Reyes VEAR Gaw, MD [facility] [email or web address] 12/06/2023, 1:07 PM

## 2023-12-06 NOTE — Procedures (Addendum)
 [name]  MRN: [id number]  Epilepsy Attending: Pastor Falling  Referring Physician/Provider:  Date: 12/06/2023 Duration: 25 minutes    Patient history: 63 year old woman with  SAH, now with seizure like activity, EEG to rule out seizures   Level of alertness: Awake, drowsy, sleep  AEDs during EEG study:   Technical aspects: This EEG study was done with scalp electrodes positioned according to the 10-20 International system of electrode placement. Electrical activity was reviewed with band pass filter of 1-70Hz , sensitivity of 7 uV/mm, display speed of 15mm/sec with a 60Hz  notched filter applied as appropriate. EEG data were recorded continuously and digitally stored.  Video monitoring was available and reviewed as appropriate.  Description: The posterior dominant rhythm consists of 3-7 Hz activity of moderate voltage (25-35 uV) seen predominantly in posterior head regions, symmetric and reactive to eye opening and eye closing. Drowsiness was characterized by attenuation of the posterior background rhythm. Sleep was not seen. EEG showed continuous generalized polymorphic sharply contoured 3 to 6 Hz theta-delta slowing. Physiologic photic driving was not seen during photic stimulation.  Hyperventilation was not performed.     ABNORMALITY - Continuous slow, generalized   IMPRESSION: This study is suggestive of mild to moderate diffuse encephalopathy, nonspecific etiology but likely related to sedation, toxic-metabolic etiology.  No seizures or epileptiform discharges were seen throughout the recording.   Pastor Falling MD Neurology

## 2023-12-06 NOTE — Progress Notes (Signed)
 Occupational Therapy Treatment Patient Details Name: Andrea Bridges MRN: [id number] DOB: [birth year] Today's Date: 12/06/2023   History of present illness Pt is a 63 y.o. female who presented 11/20/23 with a headache. Upon arrival to ED, she became nauseated and unresponsive. 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 S/p R frontal ventricular catheter placement 7/13. ETT 7/13-7/14. Cortrak placed 7/14. S/p diagnostic cerebral angiogram 7/15, but prematurely aborted due to pt agitation. 7/21 new EVD placed overnight early AM due to malfunction of old catheter, plan for repeat angiogram 7/22. S/p EVD removal 7/26. PMH: anxiety, skin cancer   OT comments  Pt seen after status change this am; no significant finding on CT or EEG. Care plan goals downgraded. Pt was limited by low LOA, poor command following, limited eye openings, global weakness and poor sitting balance. Overall she needed max A for bed mobility with limited initiation. Once sitting, pt intermittently righted her reactions, otherwise she needed max A to catch posterior and R lateral LOBs. Pt now required max-total A for most ADLs including sock management which she was able to do on her initial evaluation with just set up A. OT to continue to follow acutely to facilitate progress towards established goals. Pt will continue to benefit from intensive inpatient follow up therapy, >3 hours/day after discharge.       If plan is discharge home, recommend the following:  A lot of help with bathing/dressing/bathroom;A lot of help with walking and/or transfers;Assistance with cooking/housework;Assist for transportation;Direct supervision/assist for financial management;Direct supervision/assist for medications management;Supervision due to cognitive status;Help with stairs or ramp for entrance   Equipment Recommendations  BSC/3in1;Tub/shower seat       Precautions / Restrictions Precautions Precautions: Fall Recall of  Precautions/Restrictions: Impaired Restrictions Weight Bearing Restrictions Per Provider Order: No       Mobility Bed Mobility Overal bed mobility: Needs Assistance       Supine to sit: Max assist Sit to supine: Max assist   General bed mobility comments: significant cues and increased time given, pt with minimal active participation    Transfers                   General transfer comment: deferred     Balance Overall balance assessment: Needs assistance Sitting-balance support: Feet supported, Bilateral upper extremity supported Sitting balance-Leahy Scale: Poor Sitting balance - Comments: posterior and R lateral LOBs, pt needed CGA-max A to correct                                   ADL either performed or assessed with clinical judgement   ADL Overall ADL's : Needs assistance/impaired     Grooming: Maximal assistance       Lower Body Bathing: Total assistance   Upper Body Dressing : Maximal assistance   Lower Body Dressing: Total assistance Lower Body Dressing Details (indicate cue type and reason): total A for socks Toilet Transfer: Total assistance Toilet Transfer Details (indicate cue type and reason): bed level         Functional mobility during ADLs: Maximal assistance (bed level) General ADL Comments: pt had a status change this am, now presents with low LOA, poor command following, poor sitting balance and was unable to progress to standing    Extremity/Trunk Assessment Upper Extremity Assessment Upper Extremity Assessment: Generalized weakness (difficult to assess, pt moved R&L equally thorughout. obvious weakness.)   Lower Extremity Assessment  Lower Extremity Assessment: Defer to PT evaluation        Vision   Vision Assessment?: Vision impaired- to be further tested in functional context (eyes closed most of the session, open eyes to command some. poor vsiual attention)   Perception Perception Perception: Not  tested   Praxis Praxis Praxis: Not tested   Communication Communication Communication: Impaired Factors Affecting Communication: Difficulty expressing self;Reduced clarity of speech   Cognition Arousal: Obtunded Behavior During Therapy: Flat affect Cognition: Cognition impaired Difficult to assess due to: Level of arousal           OT - Cognition Comments: pt was able to state her name and location, she intermittently followed simple 1 step cues with repetition and incrased time. Pt fallign asleep often throughout session                 Following commands: Impaired        Cueing   Cueing Techniques: Verbal cues, Tactile cues        General Comments VSS, family present    Pertinent Vitals/ Pain       Pain Assessment Pain Assessment: Faces Faces Pain Scale: Hurts a little bit Pain Location: head Pain Descriptors / Indicators: Headache Pain Intervention(s): Limited activity within patient's tolerance, Monitored during session   Frequency  Min 2X/week        Progress Toward Goals  OT Goals(current goals can now be found in the care plan section)  Progress towards OT goals: Not progressing toward goals - comment (status change, now required significant assist)  Acute Rehab OT Goals Patient Stated Goal: per family, to get better OT Goal Formulation: With patient/family Time For Goal Achievement: 12/20/23 Potential to Achieve Goals: Fair ADL Goals Pt Will Perform Grooming: with set-up;sitting Pt Will Perform Upper Body Dressing: with set-up;sitting Pt Will Perform Lower Body Dressing: with min assist;sit to/from stand Pt Will Transfer to Toilet: with min assist;ambulating Additional ADL Goal #1: pt will follow simple 1 step commands 100% of sesssion   AM-PAC OT 6 Clicks Daily Activity     Outcome Measure   Help from another person eating meals?: A Lot Help from another person taking care of personal grooming?: A Lot Help from another person  toileting, which includes using toliet, bedpan, or urinal?: Total Help from another person bathing (including washing, rinsing, drying)?: A Lot Help from another person to put on and taking off regular upper body clothing?: A Lot Help from another person to put on and taking off regular lower body clothing?: Total 6 Click Score: 10    End of Session    OT Visit Diagnosis: Unsteadiness on feet (R26.81);Other abnormalities of gait and mobility (R26.89);Muscle weakness (generalized) (M62.81)   Activity Tolerance     Patient Left     Nurse Communication          Time: 1240-1305 OT Time Calculation (min): 25 min  Charges: OT General Charges $OT Visit: 1 Visit OT Treatments $Therapeutic Activity: 23-37 mins  Lucie Kendall, OTR/L Acute Rehabilitation Services Office [phone] Secure Chat Communication Preferred   [name] 12/06/2023, 1:38 PM

## 2023-12-07 ENCOUNTER — Other Ambulatory Visit (HOSPITAL_COMMUNITY)

## 2023-12-07 DIAGNOSIS — I609 Nontraumatic subarachnoid hemorrhage, unspecified: Secondary | ICD-10-CM | POA: Diagnosis not present

## 2023-12-07 DIAGNOSIS — E871 Hypo-osmolality and hyponatremia: Secondary | ICD-10-CM | POA: Diagnosis not present

## 2023-12-07 DIAGNOSIS — R338 Other retention of urine: Secondary | ICD-10-CM | POA: Diagnosis not present

## 2023-12-07 LAB — CBC
HCT: 35.9 % — ABNORMAL LOW (ref 36.0–46.0)
Hemoglobin: 12.2 g/dL (ref 12.0–15.0)
MCH: 27.7 pg (ref 26.0–34.0)
MCHC: 34 g/dL (ref 30.0–36.0)
MCV: 81.4 fL (ref 80.0–100.0)
Platelets: 428 K/uL — ABNORMAL HIGH (ref 150–400)
RBC: 4.41 MIL/uL (ref 3.87–5.11)
RDW: 14.8 % (ref 11.5–15.5)
WBC: 9.8 K/uL (ref 4.0–10.5)
nRBC: 0 % (ref 0.0–0.2)

## 2023-12-07 LAB — RENAL FUNCTION PANEL
Albumin: 3 g/dL — ABNORMAL LOW (ref 3.5–5.0)
Anion gap: 10 (ref 5–15)
BUN: 21 mg/dL (ref 8–23)
CO2: 21 mmol/L — ABNORMAL LOW (ref 22–32)
Calcium: 9.3 mg/dL (ref 8.9–10.3)
Chloride: 101 mmol/L (ref 98–111)
Creatinine, Ser: 0.77 mg/dL (ref 0.44–1.00)
GFR, Estimated: >60 mL/min (ref >60)
Glucose, Bld: 118 mg/dL — ABNORMAL HIGH (ref 70–99)
Phosphorus: 3.1 mg/dL (ref 2.5–4.6)
Potassium: 4.2 mmol/L (ref 3.5–5.1)
Sodium: 132 mmol/L — ABNORMAL LOW (ref 135–145)

## 2023-12-07 LAB — GLUCOSE, CAPILLARY
Glucose-Capillary: 106 mg/dL — ABNORMAL HIGH (ref 70–99)
Glucose-Capillary: 107 mg/dL — ABNORMAL HIGH (ref 70–99)
Glucose-Capillary: 113 mg/dL — ABNORMAL HIGH (ref 70–99)
Glucose-Capillary: 114 mg/dL — ABNORMAL HIGH (ref 70–99)
Glucose-Capillary: 135 mg/dL — ABNORMAL HIGH (ref 70–99)
Glucose-Capillary: 188 mg/dL — ABNORMAL HIGH (ref 70–99)

## 2023-12-07 NOTE — Hospital Course (Signed)
 63 y.o. female with PMH of  PCOS, tobacco use, anxiety who presented with headache and altered mental status.  CT head showed diffuse basilar subarachnoid hemorrhage with intraventricular extension and early ventriculomegaly.  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  Patient had right frontal ventricular catheter placed by Dr. Gillie on 11/20/2023.  7/15 had limited angiogram completed that did not show aneurysms, AVMs or fistulas.  Patient required replacement of the right frontal ventricular catheter due to prior catheter becoming nonfunctional.  Ventricular catheter was removed on 7/26.  Patient noted to have waxing and waning confusion.  Patient was treated for UTI.  She also had urinary retention requiring Foley catheter.  Core track was discontinued, following with SLP.  Patient seen by PT and OT and found to have functional deficits.  Yesterday patient was able to ambulate with min assist, sit to stand mod assist.     This AM, patient had acute altered mental status and shaking episodes.  Nonresponsive not following commands.  Had stat CT scan which was negative and EEG done which showed mild to moderate diffuse encephalopathy and no seizures.  Keppra  initiated   Assessment and Plan:   Nontraumatic subarachnoid hemorrhage with hydrocephalus - S/p EVD 7/13 with replacement 7/21.  Followed closely by neurosurgery.  Keppra  on board.  Continue Nimotop .  Serial neuroexams.   Acute encephalopathy with fevers - Completed ceftriaxone x 7 days.   Acute urinary retention - Failed voiding trial yesterday requiring In-N-Out catheter.  Will attempt again today.  Consider placing Foley catheter.  Flomax  and bethanechol  on board.   Dysphagia with poor p.o. intake - Appetite increasing.  DC core track 7/28.  SLP following closely.    Hyponatremia - Avoid hypotonic fluids.  Restrict free water.  Salt tabs 3 times daily.   Goals of care - Currently being evaluated by CIR/inpatient rehab.

## 2023-12-07 NOTE — Progress Notes (Signed)
  NEUROSURGERY PROGRESS NOTE   No issues overnight. Back to baseline this am, no HA.  EXAM:  BP (!) 106/57 (BP Location: Right Arm)   Pulse 68   Temp (!) 97.5 F (36.4 C) (Oral)   Resp 17   Ht 5' 7 (1.702 m)   Wt 85.6 kg   LMP  (LMP Unknown)   SpO2 100%   BMI 29.56 kg/m   Awake, alert, oriented Answers questions appropriately Pupils reactive Good strength BUE/BLE  IMPRESSION:  63 y.o. female angio negative SAH d#17, AMS yesterday likely SZ. Back to baseline today.  PLAN: - Cont Keppra  500mg  BID - Cont PT/OT - Dispo planning   Gerldine Maizes, MD [facility]

## 2023-12-07 NOTE — Progress Notes (Signed)
 Progress Note   Patient: Andrea Bridges FMW:[id number] DOB: 1960/08/29 DOA: 11/20/2023  DOS: the patient was seen and examined on 12/07/2023   Brief hospital course:  63 y.o. female with PMH of  PCOS, tobacco use, anxiety who presented with headache and altered mental status.  CT head showed diffuse basilar subarachnoid hemorrhage with intraventricular extension and early ventriculomegaly.  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  Patient had right frontal ventricular catheter placed by Dr. Gillie on 11/20/2023.  7/15 had limited angiogram completed that did not show aneurysms, AVMs or fistulas.  Patient required replacement of the right frontal ventricular catheter due to prior catheter becoming nonfunctional.  Ventricular catheter was removed on 7/26.  Patient noted to have waxing and waning confusion.  Patient was treated for UTI.  She also had urinary retention requiring Foley catheter.  Core track was discontinued, following with SLP.  Patient seen by PT and OT and found to have functional deficits.  Yesterday patient was able to ambulate with min assist, sit to stand mod assist.     This AM, patient had acute altered mental status and shaking episodes.  Nonresponsive not following commands.  Had stat CT scan which was negative and EEG done which showed mild to moderate diffuse encephalopathy and no seizures.  Keppra  initiated  Assessment and Plan:  Nontraumatic subarachnoid hemorrhage with hydrocephalus - S/p EVD 7/13 with replacement 7/21.  Followed closely by neurosurgery.  Keppra  on board.  Continue Nimotop .  Serial neuroexams.  Acute encephalopathy with fevers - Completed ceftriaxone x 7 days.  Acute urinary retention - Failed voiding trial yesterday requiring In-N-Out catheter.  Will attempt again today.  Consider placing Foley  catheter.  Flomax  and bethanechol  on board.  Dysphagia with poor p.o. intake - Appetite increasing.  DC core track 7/28.  SLP following closely.  Hyponatremia - Avoid hypotonic fluids.  Restrict free water.  Salt tabs 3 times daily.  Goals of care - Currently being evaluated by CIR/inpatient rehab.   Subjective: Patient resting comfortably, family at bedside.  No acute events overnight.  Yesterday had seizure-like activity however today appears much improved.  Denies any fever, shortness of breath, chest pain, nausea, vomiting, abdominal pain.  Still profoundly weak.  Physical Exam:  Vitals:   12/07/23 0405 12/07/23 0444 12/07/23 0753 12/07/23 1150  BP: 120/70  (!) 106/57 (!) 91/59  Pulse: 63  68 74  Resp: 18  17 17   Temp: 99.8 F (37.7 C)  (!) 97.5 F (36.4 C) 98.3 F (36.8 C)  TempSrc: Oral  Oral Oral  SpO2: 98%  100% 99%  Weight:  85.6 kg    Height:        GENERAL:  Alert, pleasant, no acute distress  HEENT:  EOMI CARDIOVASCULAR:  RRR, no murmurs appreciated RESPIRATORY:  Clear to auscultation, no wheezing, rales, or rhonchi GASTROINTESTINAL:  Soft, nontender, nondistended EXTREMITIES:  No LE edema bilaterally NEURO:  No new focal deficits appreciated SKIN:  No rashes noted PSYCH:  Appropriate mood and affect     Data Reviewed:  Imaging Studies: EEG adult Result Date: 12/06/2023 Gregg Lek, MD     12/06/2023 11:22 AM [name] MRN: [id number] Epilepsy Attending: Lek Gregg Referring Physician/Provider: Date: 12/06/2023 Duration: 25 minutes  Patient history: 63 year old woman with  SAH, now with seizure like activity, EEG to rule out seizures Level  of alertness: Awake, drowsy, sleep AEDs during EEG study: Technical aspects: This EEG study was done with scalp electrodes positioned according to the 10-20 International system of electrode placement. Electrical activity was reviewed with band pass filter of 1-70Hz , sensitivity of 7 uV/mm, display speed  of 80mm/sec with a 60Hz  notched filter applied as appropriate. EEG data were recorded continuously and digitally stored.  Video monitoring was available and reviewed as appropriate. Description: The posterior dominant rhythm consists of 3-7 Hz activity of moderate voltage (25-35 uV) seen predominantly in posterior head regions, symmetric and reactive to eye opening and eye closing. Drowsiness was characterized by attenuation of the posterior background rhythm. Sleep was not seen. EEG showed continuous generalized polymorphic sharply contoured 3 to 6 Hz theta-delta slowing. Physiologic photic driving was not seen during photic stimulation.  Hyperventilation was not performed.   ABNORMALITY - Continuous slow, generalized IMPRESSION: This study is suggestive of mild to moderate diffuse encephalopathy, nonspecific etiology but likely related to sedation, toxic-metabolic etiology. No seizures or epileptiform discharges were seen throughout the recording. Pastor Falling MD Neurology    CT HEAD WO CONTRAST ( ) Result Date: 12/06/2023 CLINICAL DATA:  Headache, sudden, severe EXAM: CT HEAD WITHOUT CONTRAST TECHNIQUE: Contiguous axial images were obtained from the base of the skull through the vertex without intravenous contrast. RADIATION DOSE REDUCTION: This exam was performed according to the departmental dose-optimization program which includes automated exposure control, adjustment of the mA and/or kV according to patient size and/or use of iterative reconstruction technique. COMPARISON:  CT the head dated December 03, 2023. FINDINGS: Brain: Since the previous study, a right frontal approach ventriculostomy catheter has been removed. There is some residual hemorrhage along the tract. There has been interval near complete resolution of intraventricular hemorrhage. There has also been interval near complete resolution of subarachnoid hemorrhage. The ventricles and sulci are proportionate in size. There are dystrophic  calcifications present anterior laterally within the left cerebellar hemisphere on image 8 of series 3. Vascular: Mild vascular calcifications. Skull: Right frontal burr craniotomy in defect. Otherwise, unremarkable. Sinuses/Orbits: Mild mucosal disease within the right maxillary sinus. The mastoid air cells are clear. Other: None. IMPRESSION: 1. There is a cluster of dystrophic calcifications present anterior laterally within the left cerebellar hemisphere. Recommend correlation with an MRI of the brain without and with gadolinium contrast to exclude neoplasm. 2. Interval near total resolution of intraventricular hemorrhage and subarachnoid hemorrhage following removal of a right sided ventriculostomy catheter. There is mild residual hemorrhage along the tract. Electronically Signed   By: Evalene Coho M.D.   On: 12/06/2023 09:54   VAS US  TRANSCRANIAL DOPPLER Result Date: 12/03/2023  Transcranial Doppler Patient Name:  Andrea Bridges  Date of Exam:   12/02/2023 Medical Rec #: [id number]   Accession #:    [id number] Date of Birth: Sep 17, 1960   Patient Gender: F Patient Age:   42 years Exam Location:  [facility] Procedure:      VAS US  TRANSCRANIAL DOPPLER Referring Phys: TORIBIO SHARPS --------------------------------------------------------------------------------  Indications: Subarachnoid hemorrhage. History: Large subarachnoid hemorrhage. Limitations: Movement Comparison Study: Prior TCD done 11/30/2023 Performing Technologist: Alberta Lis RVS  Examination Guidelines: A complete evaluation includes B-mode imaging, spectral Doppler, color Doppler, and power Doppler as needed of all accessible portions of each vessel. Bilateral testing is considered an integral part of a complete examination. Limited examinations for reoccurring indications may be performed as noted.  +----------+---------------+----------+-----------+-------+ RIGHT TCD Right VM (cm/s)Depth (cm)PulsatilityComment  +----------+---------------+----------+-----------+-------+ MCA  116                   1.46            +----------+---------------+----------+-----------+-------+ ACA            -112                   1.02            +----------+---------------+----------+-----------+-------+ Term ICA        65                    1.33            +----------+---------------+----------+-----------+-------+ PCA P1          -34                   1.67            +----------+---------------+----------+-----------+-------+ Opthalmic       27                    1.83            +----------+---------------+----------+-----------+-------+ ICA siphon      58                    1.15            +----------+---------------+----------+-----------+-------+ Vertebral                                             +----------+---------------+----------+-----------+-------+  +----------+--------------+----------+-----------+-------+ LEFT TCD  Left VM (cm/s)Depth (cm)PulsatilityComment +----------+--------------+----------+-----------+-------+ MCA            120                   1.40            +----------+--------------+----------+-----------+-------+ ACA            -22                   1.43            +----------+--------------+----------+-----------+-------+ Term ICA        41                   1.71            +----------+--------------+----------+-----------+-------+ PCA P1          58                   1.34            +----------+--------------+----------+-----------+-------+ Opthalmic       20                   1.81            +----------+--------------+----------+-----------+-------+ ICA siphon      38                   1.19            +----------+--------------+----------+-----------+-------+ Vertebral                                            +----------+--------------+----------+-----------+-------+ Distal ICA     -47  1.71             +----------+--------------+----------+-----------+-------+  +------------+-------+-------+             VM cm/sComment +------------+-------+-------+ Prox Basilar  -44          +------------+-------+-------+ +---------------------+----+ Left Lindegaard Ratio2.55 +---------------------+----+  Summary:  Elevated bilateral middle crebral and right anterior cerebral artery mean flow velocities suggest mioderate stenosis.Globally elevated pulsatility indices suggest diffuse increase in intracranial pressure or generalized atherosclerosis *See table(s) above for TCD measurements and observations.  Diagnosing physician: Eather Popp MD Electronically signed by Eather Popp MD on 12/03/2023 at 11:13:07 AM.    Final    CT HEAD WO CONTRAST ( ) Result Date: 12/03/2023 EXAM: CT HEAD WITHOUT CONTRAST 12/03/2023 05:53:00 AM TECHNIQUE: CT of the head was performed without the administration of intravenous contrast. Automated exposure control, iterative reconstruction, and/or weight based adjustment of the mA/kV was utilized to reduce the radiation dose to as low as reasonably achievable. COMPARISON: CT head without contrast 11/28/2023 CLINICAL HISTORY: Hydrocephalus. Chief complaints; Headache. FINDINGS: BRAIN AND VENTRICLES: Blood products along the right frontal ventriculostomy are stable. Mild subcortical edema in the high right frontal lobe adjacent to the ventriculostomy catheter is also stable. Subarachnoid blood over the convexities bilaterally, left greater than right, is improving. Minimal intraventricular hemorrhage bilaterally is stable to slightly decreased. No hydrocephalus is present. ORBITS: No acute abnormality. SINUSES: No acute abnormality. SOFT TISSUES AND SKULL: No acute soft tissue abnormality. No skull fracture. IMPRESSION: 1. Improving subarachnoid blood over the convexities bilaterally, left greater than right. 2. Stable to slightly decreased minimal intraventricular hemorrhage  bilaterally. 3. No hydrocephalus. 4. Stable blood products along the right frontal ventriculostomy and mild subcortical edema in the high right frontal lobe adjacent to the ventriculostomy catheter. 5. No new hemorrhage or focal infarct. Electronically signed by: Lonni Necessary MD 12/03/2023 06:16 AM EDT RP Workstation: [id number]   VAS US  TRANSCRANIAL DOPPLER Result Date: 12/01/2023  Transcranial Doppler Patient Name:  Andrea Bridges  Date of Exam:   11/30/2023 Medical Rec #: [id number]   Accession #:    [id number] Date of Birth: [birth year]   Patient Gender: F Patient Age:   45 years Exam Location:  [facility] Procedure:      VAS US  TRANSCRANIAL DOPPLER Referring Phys: TORIBIO SHARPS --------------------------------------------------------------------------------  Indications: Subarachnoid hemorrhage. History: Right frontal ventricular catheter placement (11/20/2023). Replacement of right frontal ventricular catheter (11/28/2023).  Performing Technologist: Ricka Sturdivant-Jones RDMS, RVT  Examination Guidelines: A complete evaluation includes B-mode imaging, spectral Doppler, color Doppler, and power Doppler as needed of all accessible portions of each vessel. Bilateral testing is considered an integral part of a complete examination. Limited examinations for reoccurring indications may be performed as noted.  +----------+---------------+----------+-----------+-------+ RIGHT TCD Right VM (cm/s)Depth (cm)PulsatilityComment +----------+---------------+----------+-----------+-------+ MCA             149         4.50      1.11            +----------+---------------+----------+-----------+-------+ ACA             -94                   1.07            +----------+---------------+----------+-----------+-------+ PCA P1          52                    1.18            +----------+---------------+----------+-----------+-------+  Opthalmic       28                    1.85             +----------+---------------+----------+-----------+-------+ ICA siphon      66                    1.22            +----------+---------------+----------+-----------+-------+ Vertebral       -56                   1.19            +----------+---------------+----------+-----------+-------+ Distal ICA      42                    1.35            +----------+---------------+----------+-----------+-------+  +----------+--------------+----------+-----------+-------+ LEFT TCD  Left VM (cm/s)Depth (cm)PulsatilityComment +----------+--------------+----------+-----------+-------+ MCA            118         4.80      1.27            +----------+--------------+----------+-----------+-------+ ACA            -77                   1.13            +----------+--------------+----------+-----------+-------+ Term ICA        42                   1.10            +----------+--------------+----------+-----------+-------+ PCA P1          64                   1.32            +----------+--------------+----------+-----------+-------+ Opthalmic       20                   1.23            +----------+--------------+----------+-----------+-------+ ICA siphon      61                   1.17            +----------+--------------+----------+-----------+-------+ Vertebral      -94                   1.29            +----------+--------------+----------+-----------+-------+ Distal ICA      33                   1.28            +----------+--------------+----------+-----------+-------+  +------------+-------+-------+             VM cm/sComment +------------+-------+-------+ Prox Basilar  -38          +------------+-------+-------+ Dist Basilar  -46          +------------+-------+-------+ +----------------------+---+ Right Lindegaard Ratio3.5 +----------------------+---+ +---------------------+---+ Left Lindegaard Ratio3.5 +---------------------+---+  Summary:   Eleveated mean flow velocities suggestive of moderate right middle cerebral and mild right anterior cerebral and left middle cerebral artery vasospasm.Globally elevate dpulsatility indices suggest increase intracranial pressure likely *See table(s) above for TCD measurements and observations.  Diagnosing physician: Eather Popp MD Electronically signed by Eather Popp  MD on 12/01/2023 at 8:19:04 AM.    Final    IR US  Guide Vasc Access Right PROCEDURE: Diagnostic Cerebral Angiogram  SURGEON:  Dr. Gerldine Maizes, MD  HISTORY:  The patient is a 63 y.o. yo female admitted to the hospital with sudden onset of headache and initial CT scan demonstrating diffuse subarachnoid hemorrhage.  Her initial CT angiogram was negative.  We attempted diagnostic cerebral angiogram upon admission which was aborted as the patient was somewhat agitated and removed her femoral sheath mid procedure.  She has been monitored in the intensive care unit with largely stable neurologic condition.  She presents today for follow-up diagnostic cerebral angiogram.  APPROACH:  The technical aspects of the procedure as well as its potential risks and benefits were reviewed with the patient and her family. These risks included but were not limited bleeding, infection, allergic reaction, damage to organs/vital structures, stroke, non-diagnostic procedure, and the catastrophic outcomes of heart attack, coma, and death. With an understanding of these risks, informed consent was obtained and witnessed.   The patient was placed in the supine position on the angiography table and the skin of right groin prepped in the usual sterile fashion. The procedure was performed under local anesthesia (1%-solution of bicarbonate-bufferred Lidoacaine) and conscious sedation with Versed  and fentanyl  monitored by the in-suite nurse and myself, including non-invasive blood pressure and continuous pulse oxymetry.   Access to the right common femoral artery was obtained  under ultrasound guidance using a micropuncture needle.  This allowed direct visualization of the micropuncture needle into the lumen of the right common femoral artery.  A short 5 French sheath was placed using standard Seldinger technique.  HEPARIN : 0 Units total.   CONTRAST AGENT: See IR records  FLUOROSCOPY TIME: See IR records   CATHETER(S) AND WIRE(S):   5-French JB-1 glidecatheter  0.035 glidewire   VESSELS CATHETERIZED:  Right internal carotid  Left internal carotid  Right vertebral  Left vertebral  Right common femoral  VESSELS STUDIED:  Right internal carotid, head Left internal carotid, head Left vertebral Right vertebral Right femoral  PROCEDURAL NARRATIVE:  A 5-Fr JB-1 terumo glide catheter was advanced over a 0.035 glidewire into the aortic arch. The above vessels were then sequentially catheterized and cervical/cerebral angiograms taken. After review of images, the catheter was removed without incident.   INTERPRETATION:  Right internal carotid, head:  Injection reveals the presence of a widely patent ICA, M1, and A1 segments and their branches. No aneurysms, arteriovenous malformations, or high flow fistulas are visualized.  There is mild to moderate vasospasm involving primarily the proximal segments of the middle and anterior cerebral arteries.  There is no flow mentation.  The parenchymal and venous phases are unremarkable. The venous sinuses are widely patent.   Left internal carotid, head:  Injection reveals the presence of a widely patent ICA, A1, and M1 segments and their branches. No aneurysms, arteriovenous malformations, or high flow fistulas are visualized.  Similar to the contralateral side, there is mild to moderate vasospasm involving the proximal segments of the middle cerebral artery.  There is no flow limitation.  The parenchymal and venous phases are unremarkable. The venous sinuses are widely patent.   Right vertebral:  Injection reveals the presence of a widely patent vertebral  artery. This leads to a widely patent basilar artery that terminates in left P1, while the right P1 is largely hypoplastic. The basilar apex is normal. No aneurysms, arteriovenous malformations, or high flow fistulas are visualized.  No significant  vasospasm of the posterior circulation is noted.  The parenchymal and venous phases are normal. The venous sinuses are patent.   Right vertebral:   Normal vessel. No PICA aneurysm. See basilar description above.   Right femoral:   Normal vessel. No significant atherosclerotic disease. Arterial sheath in adequate position.  DISPOSITION: Upon completion of the study, the femoral sheath was removed and hemostasis obtained using a 5-Fr Exoseal closure device. Good proximal and distal lower extremity pulses were documented upon achievement of hemostasis. The procedure was well tolerated and no early complications were observed.  Patient was then transferred back to the neurointensive care unit for further care.  IMPRESSION: 1.  No intracranial aneurysms, arteriovenous malformations, or high flow fistulas are identified on this follow-up diagnostic cerebral angiogram as a source of subarachnoid hemorrhage. 2.  There is mild to moderate vasospasm involving the proximal segments of the anterior and middle cerebral arteries bilaterally, without any flow limitation.    Gerldine Maizes, MD [facility]  Electronically signed by Maizes Gerldine, MD at 11/29/2023 11:44 AM   IR ANGIO VERTEBRAL SEL VERTEBRAL BILAT MOD SED PROCEDURE: Diagnostic Cerebral Angiogram  SURGEON:  Dr. Gerldine Maizes, MD  HISTORY:  The patient is a 63 y.o. yo female admitted to the hospital with sudden onset of headache and initial CT scan demonstrating diffuse subarachnoid hemorrhage.  Her initial CT angiogram was negative.  We attempted diagnostic cerebral angiogram upon admission which was aborted as the patient was somewhat agitated and removed her femoral sheath mid  procedure.  She has been monitored in the intensive care unit with largely stable neurologic condition.  She presents today for follow-up diagnostic cerebral angiogram.  APPROACH:  The technical aspects of the procedure as well as its potential risks and benefits were reviewed with the patient and her family. These risks included but were not limited bleeding, infection, allergic reaction, damage to organs/vital structures, stroke, non-diagnostic procedure, and the catastrophic outcomes of heart attack, coma, and death. With an understanding of these risks, informed consent was obtained and witnessed.   The patient was placed in the supine position on the angiography table and the skin of right groin prepped in the usual sterile fashion. The procedure was performed under local anesthesia (1%-solution of bicarbonate-bufferred Lidoacaine) and conscious sedation with Versed  and fentanyl  monitored by the in-suite nurse and myself, including non-invasive blood pressure and continuous pulse oxymetry.   Access to the right common femoral artery was obtained under ultrasound guidance using a micropuncture needle.  This allowed direct visualization of the micropuncture needle into the lumen of the right common femoral artery.  A short 5 French sheath was placed using standard Seldinger technique.  HEPARIN : 0 Units total.   CONTRAST AGENT: See IR records  FLUOROSCOPY TIME: See IR records   CATHETER(S) AND WIRE(S):   5-French JB-1 glidecatheter  0.035 glidewire   VESSELS CATHETERIZED:  Right internal carotid  Left internal carotid  Right vertebral  Left vertebral  Right common femoral  VESSELS STUDIED:  Right internal carotid, head Left internal carotid, head Left vertebral Right vertebral Right femoral  PROCEDURAL NARRATIVE:  A 5-Fr JB-1 terumo glide catheter was advanced over a 0.035 glidewire into the aortic arch. The above vessels were then sequentially catheterized and cervical/cerebral angiograms taken. After review of  images, the catheter was removed without incident.   INTERPRETATION:  Right internal carotid, head:  Injection reveals the presence of a widely patent ICA, M1, and A1 segments and their branches. No  aneurysms, arteriovenous malformations, or high flow fistulas are visualized.  There is mild to moderate vasospasm involving primarily the proximal segments of the middle and anterior cerebral arteries.  There is no flow mentation.  The parenchymal and venous phases are unremarkable. The venous sinuses are widely patent.   Left internal carotid, head:  Injection reveals the presence of a widely patent ICA, A1, and M1 segments and their branches. No aneurysms, arteriovenous malformations, or high flow fistulas are visualized.  Similar to the contralateral side, there is mild to moderate vasospasm involving the proximal segments of the middle cerebral artery.  There is no flow limitation.  The parenchymal and venous phases are unremarkable. The venous sinuses are widely patent.   Right vertebral:  Injection reveals the presence of a widely patent vertebral artery. This leads to a widely patent basilar artery that terminates in left P1, while the right P1 is largely hypoplastic. The basilar apex is normal. No aneurysms, arteriovenous malformations, or high flow fistulas are visualized.  No significant vasospasm of the posterior circulation is noted.  The parenchymal and venous phases are normal. The venous sinuses are patent.   Right vertebral:   Normal vessel. No PICA aneurysm. See basilar description above.   Right femoral:   Normal vessel. No significant atherosclerotic disease. Arterial sheath in adequate position.  DISPOSITION: Upon completion of the study, the femoral sheath was removed and hemostasis obtained using a 5-Fr Exoseal closure device. Good proximal and distal lower extremity pulses were documented upon achievement of hemostasis. The procedure was well tolerated and no early complications were observed.   Patient was then transferred back to the neurointensive care unit for further care.  IMPRESSION: 1.  No intracranial aneurysms, arteriovenous malformations, or high flow fistulas are identified on this follow-up diagnostic cerebral angiogram as a source of subarachnoid hemorrhage. 2.  There is mild to moderate vasospasm involving the proximal segments of the anterior and middle cerebral arteries bilaterally, without any flow limitation.    Gerldine Maizes, MD [facility]  Electronically signed by Maizes Gerldine, MD at 11/29/2023 11:44 AM   IR ANGIO INTRA EXTRACRAN SEL INTERNAL CAROTID BILAT MOD SED PROCEDURE: Diagnostic Cerebral Angiogram  SURGEON:  Dr. Gerldine Maizes, MD  HISTORY:  The patient is a 63 y.o. yo female admitted to the hospital with sudden onset of headache and initial CT scan demonstrating diffuse subarachnoid hemorrhage.  Her initial CT angiogram was negative.  We attempted diagnostic cerebral angiogram upon admission which was aborted as the patient was somewhat agitated and removed her femoral sheath mid procedure.  She has been monitored in the intensive care unit with largely stable neurologic condition.  She presents today for follow-up diagnostic cerebral angiogram.  APPROACH:  The technical aspects of the procedure as well as its potential risks and benefits were reviewed with the patient and her family. These risks included but were not limited bleeding, infection, allergic reaction, damage to organs/vital structures, stroke, non-diagnostic procedure, and the catastrophic outcomes of heart attack, coma, and death. With an understanding of these risks, informed consent was obtained and witnessed.   The patient was placed in the supine position on the angiography table and the skin of right groin prepped in the usual sterile fashion. The procedure was performed under local anesthesia (1%-solution of bicarbonate-bufferred Lidoacaine) and conscious  sedation with Versed  and fentanyl  monitored by the in-suite nurse and myself, including non-invasive blood pressure and continuous pulse oxymetry.   Access to the right common femoral artery  was obtained under ultrasound guidance using a micropuncture needle.  This allowed direct visualization of the micropuncture needle into the lumen of the right common femoral artery.  A short 5 French sheath was placed using standard Seldinger technique.  HEPARIN : 0 Units total.   CONTRAST AGENT: See IR records  FLUOROSCOPY TIME: See IR records   CATHETER(S) AND WIRE(S):   5-French JB-1 glidecatheter  0.035 glidewire   VESSELS CATHETERIZED:  Right internal carotid  Left internal carotid  Right vertebral  Left vertebral  Right common femoral  VESSELS STUDIED:  Right internal carotid, head Left internal carotid, head Left vertebral Right vertebral Right femoral  PROCEDURAL NARRATIVE:  A 5-Fr JB-1 terumo glide catheter was advanced over a 0.035 glidewire into the aortic arch. The above vessels were then sequentially catheterized and cervical/cerebral angiograms taken. After review of images, the catheter was removed without incident.   INTERPRETATION:  Right internal carotid, head:  Injection reveals the presence of a widely patent ICA, M1, and A1 segments and their branches. No aneurysms, arteriovenous malformations, or high flow fistulas are visualized.  There is mild to moderate vasospasm involving primarily the proximal segments of the middle and anterior cerebral arteries.  There is no flow mentation.  The parenchymal and venous phases are unremarkable. The venous sinuses are widely patent.   Left internal carotid, head:  Injection reveals the presence of a widely patent ICA, A1, and M1 segments and their branches. No aneurysms, arteriovenous malformations, or high flow fistulas are visualized.  Similar to the contralateral side, there is mild to moderate vasospasm involving the proximal segments of the middle cerebral  artery.  There is no flow limitation.  The parenchymal and venous phases are unremarkable. The venous sinuses are widely patent.   Right vertebral:  Injection reveals the presence of a widely patent vertebral artery. This leads to a widely patent basilar artery that terminates in left P1, while the right P1 is largely hypoplastic. The basilar apex is normal. No aneurysms, arteriovenous malformations, or high flow fistulas are visualized.  No significant vasospasm of the posterior circulation is noted.  The parenchymal and venous phases are normal. The venous sinuses are patent.   Right vertebral:   Normal vessel. No PICA aneurysm. See basilar description above.   Right femoral:   Normal vessel. No significant atherosclerotic disease. Arterial sheath in adequate position.  DISPOSITION: Upon completion of the study, the femoral sheath was removed and hemostasis obtained using a 5-Fr Exoseal closure device. Good proximal and distal lower extremity pulses were documented upon achievement of hemostasis. The procedure was well tolerated and no early complications were observed.  Patient was then transferred back to the neurointensive care unit for further care.  IMPRESSION: 1.  No intracranial aneurysms, arteriovenous malformations, or high flow fistulas are identified on this follow-up diagnostic cerebral angiogram as a source of subarachnoid hemorrhage. 2.  There is mild to moderate vasospasm involving the proximal segments of the anterior and middle cerebral arteries bilaterally, without any flow limitation.    Gerldine Maizes, MD [facility]  Electronically signed by Maizes Gerldine, MD at 11/29/2023 11:44 AM   VAS US  TRANSCRANIAL DOPPLER Result Date: 11/28/2023  Transcranial Doppler Patient Name:  Andrea Bridges  Date of Exam:   11/28/2023 Medical Rec #: [id number]   Accession #:    [id number] Date of Birth: Oct 27, 1960   Patient Gender: F Patient Age:   42 years Exam Location:  [facility] Procedure:  VAS US  TRANSCRANIAL DOPPLER Referring Phys: TORIBIO SHARPS --------------------------------------------------------------------------------  Indications: Subarachnoid hemorrhage. History: Right frontal ventricular catheter placement (11/20/2023). Replacement of right frontal ventricular catheter (11/28/2023). Comparison Study: Previous exam was on 11/25/2023 Performing Technologist: Ezzie Potters RVT, RDMS  Examination Guidelines: A complete evaluation includes B-mode imaging, spectral Doppler, color Doppler, and power Doppler as needed of all accessible portions of each vessel. Bilateral testing is considered an integral part of a complete examination. Limited examinations for reoccurring indications may be performed as noted.  +----------+---------------+----------+-----------+-------+ RIGHT TCD Right VM (cm/s)Depth (cm)PulsatilityComment +----------+---------------+----------+-----------+-------+ MCA             132         5.30      1.23            +----------+---------------+----------+-----------+-------+ ACA             -31                   1.24            +----------+---------------+----------+-----------+-------+ Term ICA        36                    1.36            +----------+---------------+----------+-----------+-------+ PCA P1          39                    1.48            +----------+---------------+----------+-----------+-------+ Opthalmic       23                    2.11            +----------+---------------+----------+-----------+-------+ ICA siphon      54                    1.07            +----------+---------------+----------+-----------+-------+ Vertebral       -63         6.80      1.22            +----------+---------------+----------+-----------+-------+ Distal ICA      28                    0.85            +----------+---------------+----------+-----------+-------+   +----------+--------------+----------+-----------+-------+ LEFT TCD  Left VM (cm/s)Depth (cm)PulsatilityComment +----------+--------------+----------+-----------+-------+ MCA            137         5.20      1.32            +----------+--------------+----------+-----------+-------+ ACA            -20                   1.31            +----------+--------------+----------+-----------+-------+ Term ICA        52                   1.15            +----------+--------------+----------+-----------+-------+ PCA P1          54                   1.08            +----------+--------------+----------+-----------+-------+  Opthalmic       17                   1.89            +----------+--------------+----------+-----------+-------+ ICA siphon      34                   1.06            +----------+--------------+----------+-----------+-------+ Vertebral      -81         7.00      1.26            +----------+--------------+----------+-----------+-------+ Distal ICA      26                   1.41            +----------+--------------+----------+-----------+-------+  +------------+-------+-------+             VM cm/sComment +------------+-------+-------+ Prox Basilar  -41   0.95   +------------+-------+-------+ Dist Basilar  -59   1.09   +------------+-------+-------+ +----------------------+----+ Right Lindegaard Ratio4.71 +----------------------+----+ +---------------------+----+ Left Lindegaard Ratio5.27 +---------------------+----+  Summary:  Elevated mean velocity and Lindegaard Ratio noted bilaterally, indicating moderate vasospasm of both middle cerebral arteries. *See table(s) above for TCD measurements and observations.  Diagnosing physician: Eather Popp MD Electronically signed by Eather Popp MD on 11/28/2023 at 5:24:54 PM.    Final    CT HEAD WO CONTRAST ( ) Result Date: 11/28/2023 EXAM: CT HEAD WITHOUT CONTRAST 11/28/2023 03:11:00 AM  TECHNIQUE: CT of the head was performed without the administration of intravenous contrast. Automated exposure control, iterative reconstruction, and/or weight based adjustment of the mA/kV was utilized to reduce the radiation dose to as low as reasonably achievable. COMPARISON: 11/25/2023 CLINICAL HISTORY: Stroke, follow up. Headache. FINDINGS: BRAIN AND VENTRICLES: Unchanged small volume subarachnoid hemorrhage over both hemispheres. Unchanged small intraparenchymal hematoma along the proximal course of the shunt catheter, which terminates at the right foramen of Monro. Small amount of blood layering within the occipital horns of the lateral ventricles, unchanged. The size and configuration of the lateral ventricles is unchanged. ORBITS: No acute abnormality. SINUSES: Right maxillary sinus opacification. SOFT TISSUES AND SKULL: No acute soft tissue abnormality. No skull fracture. IMPRESSION: 1. Unchanged small volume subarachnoid hemorrhage over both hemispheres. 2. Unchanged small intraparenchymal hematoma along the proximal course of the shunt catheter, which terminates at the right foramen of [place]. 3. Small amount of blood layering within the occipital horns of the lateral ventricles, unchanged. 4. Unchanged size and configuration of the lateral ventricles. Electronically signed by: Franky Stanford MD 11/28/2023 03:21 AM EDT RP Workstation: [id number]   DG CHEST PORT 1 VIEW Result Date: 11/26/2023 CLINICAL DATA:  Fever.  Subarachnoid hemorrhage EXAM: PORTABLE CHEST 1 VIEW COMPARISON:  Chest x-ray 11/21/2023. FINDINGS: Enteric tube with tip extending beneath the diaphragm. Overlapping cardiac leads. No pneumothorax or edema. No effusion. Normal cardiopericardial silhouette. Mild bandlike changes along the lung bases. Atelectasis is favored over infiltrate but recommend follow-up. IMPRESSION: Bandlike opacities along the lung bases. Favor atelectasis over infiltrate but recommend follow-up. Enteric tube.  Electronically Signed   By: Ranell Bring M.D.   On: 11/26/2023 18:56   EEG adult Result Date: 11/26/2023 Michaela Aisha SQUIBB, MD     11/26/2023  3:11 PM History: 63 yo F with lethargy in the setting of SAH, evaluate for evidence of seizure EEG Duration: 22 minutes Sedation: none Patient State: Awake and asleep Technique: This EEG  was acquired with electrodes placed according to the International 10-20 electrode system (including Fp1, Fp2, F3, F4, C3, C4, P3, P4, O1, O2, T3, T4, T5, T6, A1, A2, Fz, Cz, Pz). The following electrodes were missing or displaced: none. Background:  There is a posterior dominant rhythm of 8 to 9 Hz which is seen at times.  There is also irregular delta and theta activity intruding into the background.  With drowsiness there is an increase in slow activity and the patient does have symmetric appearing sleep structure seen during the recording. Photic stimulation: Physiologic driving is not performed EEG Abnormalities: 1) generalized irregular slow activity Clinical Interpretation: This EEG is consistent with a mild generalized nonspecific cerebral dysfunction (encephalopathy). There was no seizure or seizure predisposition recorded on this study. Please note that lack of epileptiform activity on EEG does not preclude the possibility of epilepsy. Aisha Seals, MD [facility] If 7pm- 7am, please page neurology on call as listed in AMION.  CT HEAD WO CONTRAST ( ) Result Date: 11/25/2023 EXAM: CT HEAD WITHOUT CONTRAST 11/25/2023 04:29:05 PM TECHNIQUE: CT of the head was performed without the administration of intravenous contrast. Automated exposure control, iterative reconstruction, and/or weight based adjustment of the mA/kV was utilized to reduce the radiation dose to as low as reasonably achievable. COMPARISON: CT head 11/20/2023. CLINICAL HISTORY: Headache, increasing frequency or severity. FINDINGS: BRAIN AND VENTRICLES: Interval placement of a right frontal  approach ventriculostomy and catheter with tip terminating in the third ventricle. Small amount of parenchymal hemorrhage along the ventriculostomy catheter tract. Unchanged mild dilation of the lateral ventricles. Interval redistribution of subarachnoid hemorrhage along the cerebral convexities and associated slight increase in intraventricular hemorrhage. ORBITS: No acute abnormality. SINUSES: No acute abnormality. SOFT TISSUES AND SKULL: No acute soft tissue abnormality. No skull fracture. IMPRESSION: 1. Interval placement of a right frontal approach ventriculostomy and catheter with tip terminating in the third ventricle. Small amount of parenchymal hemorrhage along the ventriculostomy catheter tract. 2. Unchanged mild dilation of the lateral ventricles. 3. Interval redistribution of subarachnoid hemorrhage along the cerebral convexities and associated slight increase in intraventricular hemorrhage. Electronically signed by: Ryan Chess MD 11/25/2023 04:41 PM EDT RP Workstation: [id number]   VAS US  TRANSCRANIAL DOPPLER Result Date: 11/25/2023  Transcranial Doppler Patient Name:  Andrea Bridges  Date of Exam:   11/25/2023 Medical Rec #: [id number]   Accession #:    [id number] Date of Birth: [birth year]   Patient Gender: F Patient Age:   10 years Exam Location:  [facility] Procedure:      VAS US  TRANSCRANIAL DOPPLER Referring Phys: TORIBIO SHARPS --------------------------------------------------------------------------------  Indications: Subarachnoid hemorrhage. History: Right frontal ventricular catheter placement (11/20/2023). Comparison Study: Previous exam on 11/23/2023 Performing Technologist: Ezzie Potters RVT, RDMS  Examination Guidelines: A complete evaluation includes B-mode imaging, spectral Doppler, color Doppler, and power Doppler as needed of all accessible portions of each vessel. Bilateral testing is considered an integral part of a complete examination. Limited examinations for reoccurring  indications may be performed as noted.  +----------+---------------+----------+-----------+--------+ RIGHT TCD Right VM (cm/s)Depth (cm)PulsatilityComment  +----------+---------------+----------+-----------+--------+ MCA             70          5.20      1.30    130 cm/s +----------+---------------+----------+-----------+--------+ ACA             -38                   1.14             +----------+---------------+----------+-----------+--------+  Term ICA        48                    1.31             +----------+---------------+----------+-----------+--------+ PCA P1          35                    1.33             +----------+---------------+----------+-----------+--------+ Opthalmic       15                    2.04             +----------+---------------+----------+-----------+--------+ ICA siphon      38                    1.14             +----------+---------------+----------+-----------+--------+ Vertebral       -33                   0.98             +----------+---------------+----------+-----------+--------+ Distal ICA      36                    1.22             +----------+---------------+----------+-----------+--------+  +----------+--------------+----------+-----------+-------+ LEFT TCD  Left VM (cm/s)Depth (cm)PulsatilityComment +----------+--------------+----------+-----------+-------+ MCA             63                   1.21            +----------+--------------+----------+-----------+-------+ ACA            -34                   1.17            +----------+--------------+----------+-----------+-------+ Term ICA        45                   1.42            +----------+--------------+----------+-----------+-------+ PCA P1          30                   1.22            +----------+--------------+----------+-----------+-------+ Opthalmic       14                   2.17             +----------+--------------+----------+-----------+-------+ ICA siphon      33                   1.11            +----------+--------------+----------+-----------+-------+ Vertebral      -34                   1.25            +----------+--------------+----------+-----------+-------+ Distal ICA      25                   1.14            +----------+--------------+----------+-----------+-------+  +------------+-------+-------+  VM cm/sComment +------------+-------+-------+ Prox Basilar  -41   0.98   +------------+-------+-------+ Dist Basilar  -52   0.96   +------------+-------+-------+ +----------------------+----+ Right Lindegaard Ratio1.94 +----------------------+----+ +---------------------+----+ Left Lindegaard Ratio2.52 +---------------------+----+  Summary: This was a normal transcranial Doppler study, with normal flow direction and velocity of all identified vessels of the anterior and posterior circulations, with no evidence of stenosis, vasospasm or occlusion. There was no evidence of intracranial disease.  *See table(s) above for TCD measurements and observations.  Diagnosing physician: Eather Popp MD Electronically signed by Eather Popp MD on 11/25/2023 at 1:18:57 PM.    Final    VAS US  TRANSCRANIAL DOPPLER Result Date: 11/25/2023  Transcranial Doppler Patient Name:  Andrea Bridges  Date of Exam:   11/23/2023 Medical Rec #: [id number]   Accession #:    [id number] Date of Birth: 11/04/1960   Patient Gender: F Patient Age:   23 years Exam Location:  [facility] Procedure:      VAS US  TRANSCRANIAL DOPPLER Referring Phys: TORIBIO SHARPS --------------------------------------------------------------------------------  Indications: Subarachnoid hemorrhage. History: Right frontal ventricular catheter placement (11/20/2023). Comparison Study: Previous exam 11/21/2023 Performing Technologist: Ezzie Potters RVT, RDMS  Examination Guidelines: A complete evaluation includes  B-mode imaging, spectral Doppler, color Doppler, and power Doppler as needed of all accessible portions of each vessel. Bilateral testing is considered an integral part of a complete examination. Limited examinations for reoccurring indications may be performed as noted.  +----------+---------------+----------+-----------+-------+ RIGHT TCD Right VM (cm/s)Depth (cm)PulsatilityComment +----------+---------------+----------+-----------+-------+ MCA             60                    0.92            +----------+---------------+----------+-----------+-------+ ACA             -18                   1.32            +----------+---------------+----------+-----------+-------+ Term ICA        44                    1.20            +----------+---------------+----------+-----------+-------+ PCA P1          37                    0.89            +----------+---------------+----------+-----------+-------+ Opthalmic       19                    1.85            +----------+---------------+----------+-----------+-------+ ICA siphon      20                    0.64            +----------+---------------+----------+-----------+-------+ Vertebral       -54                   0.96            +----------+---------------+----------+-----------+-------+ Distal ICA      36                    1.12            +----------+---------------+----------+-----------+-------+  +----------+--------------+----------+-----------+-------+ LEFT TCD  Left VM (cm/s)Depth (cm)PulsatilityComment +----------+--------------+----------+-----------+-------+  MCA             62                   1.05            +----------+--------------+----------+-----------+-------+ ACA            -40                   1.00            +----------+--------------+----------+-----------+-------+ Term ICA        43                   0.93            +----------+--------------+----------+-----------+-------+ PCA  P1          39                   0.88            +----------+--------------+----------+-----------+-------+ Opthalmic       14                   1.91            +----------+--------------+----------+-----------+-------+ ICA siphon      43                   0.93            +----------+--------------+----------+-----------+-------+ Vertebral      -67                   0.90            +----------+--------------+----------+-----------+-------+ Distal ICA      26                   1.26            +----------+--------------+----------+-----------+-------+  +------------+-------+-------+             VM cm/sComment +------------+-------+-------+ Prox Basilar  -43   0.84   +------------+-------+-------+ Dist Basilar  -51   0.83   +------------+-------+-------+ +----------------------+----+ Right Lindegaard Ratio1.67 +----------------------+----+ +---------------------+----+ Left Lindegaard Ratio2.38 +---------------------+----+  Summary: This was a normal transcranial Doppler study, with normal flow direction and velocity of all identified vessels of the anterior and posterior circulations, with no evidence of stenosis, vasospasm or occlusion. There was no evidence of intracranial disease. *See table(s) above for TCD measurements and observations.  Diagnosing physician: Eather Popp MD Electronically signed by Eather Popp MD on 11/25/2023 at 1:17:19 PM.    Final    VAS US  TRANSCRANIAL DOPPLER Result Date: 11/25/2023  Transcranial Doppler Patient Name:  Andrea Bridges  Date of Exam:   11/21/2023 Medical Rec #: [id number]   Accession #:    [id number] Date of Birth: 1961/01/07   Patient Gender: F Patient Age:   63 years Exam Location:  [facility] Procedure:      VAS US  TRANSCRANIAL DOPPLER Referring Phys: TORIBIO SHARPS --------------------------------------------------------------------------------  Indications: Subarachnoid hemorrhage. Limitations: Patient immobility  Comparison Study: No prior studies. Performing Technologist: Cordella Collet RVT  Examination Guidelines: A complete evaluation includes B-mode imaging, spectral Doppler, color Doppler, and power Doppler as needed of all accessible portions of each vessel. Bilateral testing is considered an integral part of a complete examination. Limited examinations for reoccurring indications may be performed as noted.  +----------+---------------+----------+-----------+------------------+ RIGHT TCD Right VM (cm/s)Depth (cm)Pulsatility     Comment       +----------+---------------+----------+-----------+------------------+  MCA             50                    1.24                       +----------+---------------+----------+-----------+------------------+ ACA                                           Unable to insonate +----------+---------------+----------+-----------+------------------+ Term ICA        25                    1.27                       +----------+---------------+----------+-----------+------------------+ PCA P1          25                    1.14                       +----------+---------------+----------+-----------+------------------+ Opthalmic       14                    1.57                       +----------+---------------+----------+-----------+------------------+ ICA siphon      22                    0.83                       +----------+---------------+----------+-----------+------------------+ Vertebral                                     Unable to insonate +----------+---------------+----------+-----------+------------------+ Distal ICA      -15                   1.19                       +----------+---------------+----------+-----------+------------------+  +----------+--------------+----------+-----------+------------------+ LEFT TCD  Left VM (cm/s)Depth (cm)Pulsatility     Comment        +----------+--------------+----------+-----------+------------------+ MCA             51                   0.91                       +----------+--------------+----------+-----------+------------------+ ACA                                          Unable to insonate +----------+--------------+----------+-----------+------------------+ Term ICA                                     Unable to insonate +----------+--------------+----------+-----------+------------------+ PCA P1          22  1.29                       +----------+--------------+----------+-----------+------------------+ Opthalmic       17                   1.71                       +----------+--------------+----------+-----------+------------------+ ICA siphon                                   Unable to insonate +----------+--------------+----------+-----------+------------------+ Vertebral                                    Unable to insonate +----------+--------------+----------+-----------+------------------+ Distal ICA     -17                   1.25                       +----------+--------------+----------+-----------+------------------+  +------------+-------+------------------+             VM cm/s     Comment       +------------+-------+------------------+ Prox Basilar       Unable to insonate +------------+-------+------------------+ Dist Basilar       Unable to insonate +------------+-------+------------------+ +----------------------+----+ Right Lindegaard Ratio3.33 +----------------------+----+ +---------------------+-+ Left Lindegaard Ratio3 +---------------------+-+  Summary:  Normal mean flow velocities in anterior cerebral circulation without any evidence of vasospasm. Poor suboccipital window limits evaluation of posterior circulation vessels. *See table(s) above for TCD measurements and observations.  Diagnosing physician: Electronically signed by  Eather Popp MD on 11/25/2023 at 1:16:47 PM.    Final    IR ANGIO VERTEBRAL SEL VERTEBRAL UNI R MOD SED PROCEDURE: Diagnostic Cerebral Angiogram  SURGEON:  Dr. Gerldine Maizes, MD  HISTORY:  The patient is a 63 y.o. yo female admitted with sudden onset severe headache, obtundation, and CT scan demonstrating diffuse subarachnoid hemorrhage.  CT angiogram was negative for intracranial aneurysm however given the degree of hemorrhage and presence of hydrocephalus, there is high suspicion for underlying vascular malformation/aneurysm.  She therefore presents for diagnostic cerebral angiogram.  APPROACH:  The technical aspects of the procedure as well as its potential risks and benefits were reviewed with the patient and her husband. These risks included but were not limited bleeding, infection, allergic reaction, damage to organs/vital structures, stroke, non-diagnostic procedure, and the catastrophic outcomes of heart attack, coma, and death. With an understanding of these risks, informed consent was obtained and witnessed.   The patient was placed in the supine position on the angiography table and the skin of right groin prepped in the usual sterile fashion. The procedure was performed under local anesthesia (1%-solution of bicarbonate-bufferred Lidoacaine) and fentanyl  monitored by the in-suite nurse and myself, including non-invasive blood pressure and continuous pulse oxymetry.   Access to the right common femoral artery was obtained using standard Seldinger technique.  A short 5 French sheath was introduced and connected to continuous heparinized saline flush.  HEPARIN : 0 Units total.   CONTRAST AGENT: See IR records  FLUOROSCOPY TIME: See IR records   CATHETER(S) AND WIRE(S):   5-French JB-1 glidecatheter  0.035 glidewire   VESSELS CATHETERIZED:  Right vertebral  Right common femoral  VESSELS STUDIED:  Right vertebral Right femoral  PROCEDURAL  NARRATIVE:  A 5-Fr JB-1 terumo glide catheter was advanced over a  0.035 glidewire into the aortic arch.  Initially the right vertebral artery was selectively catheterized and cerebral angiogram taken.  There was technical malfunction of the biplane unit.  The system was restarted.  During the reboot, the patient attempted to get up from the angiogram table and her right femoral sheath was inadvertently removed.  We therefore aborted the procedure at this point.  INTERPRETATION:  Right vertebral:  Injection reveals the presence of a widely patent vertebral artery. This leads to a widely patent basilar artery that terminates in bilateral P1. The basilar apex is normal. No aneurysms, arteriovenous malformations, or high flow fistulas are visualized.  There is no posterior circulation vasospasm seen.  The parenchymal and venous phases are normal. The venous sinuses are patent.   DISPOSITION: After the procedure was aborted and the patient's sheath was removed, hemostasis was secured with manual compression.  Good proximal and distal lower extremity pulses were documented upon achievement of hemostasis.  Patient was then transferred back to the intensive care unit for further care.  IMPRESSION: 1.  Prematurely aborted diagnostic cerebral angiogram, however no aneurysms, AVMs, or fistulas are seen on the sole right vertebral artery injection.    Gerldine Maizes, MD [facility]  Electronically signed by Maizes Gerldine, MD at 11/22/2023 10:17 AM   EEG adult Result Date: 11/21/2023 Shelton Arlin KIDD, MD     11/21/2023 10:35 AM [name] MRN: [id number] Epilepsy Attending: Arlin KIDD Shelton Referring Physician/Provider: Rosemarie Eather RAMAN, MD Date: 11/21/2023 Duration: 26.33 mins Patient history: 63yo F with SAH, now with ams. EEG to evaluate for seizure Level of alertness:  comatose/ lethargic AEDs during EEG study: LEV, propofol  Technical aspects: This EEG study was done with scalp electrodes positioned according to the 10-20 International  system of electrode placement. Electrical activity was reviewed with band pass filter of 1-70Hz , sensitivity of 7 uV/mm, display speed of 62mm/sec with a 60Hz  notched filter applied as appropriate. EEG data were recorded continuously and digitally stored.  Video monitoring was available and reviewed as appropriate. Description: EEG showed continuous generalized 3 to 6 Hz theta-delta slowing with overriding 13 to 15 Hz beta activity distributed symmetrically and diffusely. Hyperventilation and photic stimulation were not performed.   ABNORMALITY - Continuous slow, generalized - Excessive beta, generalized IMPRESSION: This study is suggestive of moderate to severe diffuse encephalopathy, nonspecific etiology but likely related to sedation. No seizures or epileptiform discharges were seen throughout the recording. Arlin KIDD Shelton   DG Chest Port 1 View Result Date: 11/21/2023 EXAM: 1 VIEW XRAY OF THE CHEST 11/21/2023 06:06:16 AM COMPARISON: None available. CLINICAL HISTORY: History of ETT. ETT, SAH; ROVER. FINDINGS: LUNGS AND PLEURA: No focal pulmonary opacity. No pulmonary edema. No pleural effusion. No pneumothorax. HEART AND MEDIASTINUM: No acute abnormality of the cardiac and mediastinal silhouettes. BONES AND SOFT TISSUES: No acute osseous abnormality. LINES AND TUBES: Endotracheal tube terminates 3 cm above the carina. Gastric tube courses along the inferior border of the film. IMPRESSION: 1. No acute cardiopulmonary pathology. 2. Endotracheal tube and gastric tube in appropriate position as described . Electronically signed by: Lonni Necessary MD 11/21/2023 06:10 AM EDT RP Workstation: [id number]   DG Abd 1 View Result Date: 11/20/2023 CLINICAL DATA:  Check gastric catheter placement EXAM: ABDOMEN - 1 VIEW COMPARISON:  None Available. FINDINGS: Gastric catheter extends into the stomach. No free air is seen. No obstructive changes are noted. IMPRESSION:  Gastric catheter within the stomach.  Electronically Signed   By: Oneil Devonshire M.D.   On: 11/20/2023 20:34   CT HEAD CODE STROKE WO CONTRAST Result Date: 11/20/2023 EXAM: CT HEAD WITHOUT CONTRAST 11/20/2023 04:14:00 PM TECHNIQUE: CT of the head was performed without the administration of intravenous contrast. Automated exposure control, iterative reconstruction, and/or weight based adjustment of the mA/kV was utilized to reduce the radiation dose to as low as reasonably achievable. COMPARISON: None available. CLINICAL HISTORY: Neuro deficit, acute, stroke suspected. Chief complaints; headache; CT HEAD CODE STROKE WO CONTRAST; Neuro deficit, acute, stroke suspected; Deficits- right side weakness, non verbal FINDINGS: BRAIN AND VENTRICLES: Diffuse basilar subarachnoid hemorrhage with intraventricular extension and early ventriculomegaly. The hemorrhage is greatest along the left cerebellopontine angle cistern possibly indicating the site of origin. Attention on follow up CTA. ORBITS: No acute abnormality. SINUSES: No acute abnormality. SOFT TISSUES AND SKULL: No acute soft tissue abnormality. No skull fracture. IMPRESSION: 1. Diffuse basilar subarachnoid hemorrhage with intraventricular extension and early ventriculomegaly, greatest along the left cerebellopontine angle cistern, possibly indicating the site of origin. Attention on follow-up CTA. Code stroke results were communicated to Dr. Matthews at 4:15 pm on 11/20/2023 by phone. Electronically signed by: Ryan Chess MD 11/20/2023 04:35 PM EDT RP Workstation: [id number]   CT ANGIO HEAD NECK W WO CM (CODE STROKE) Result Date: 11/20/2023 EXAM: CTA HEAD AND NECK WITH AND WITHOUT 11/20/2023 04:15:00 PM TECHNIQUE: CTA of the head and neck was performed with and without the administration of intravenous contrast. Multiplanar 2D and/or 3D reformatted images are provided for review. Automated exposure control, iterative reconstruction, and/or weight based adjustment of the mA/kV was utilized to reduce the  radiation dose to as low as reasonably achievable. Stenosis of the internal carotid arteries measured using NASCET criteria. COMPARISON: None available CLINICAL HISTORY: Neuro deficit, acute, stroke suspected. Chief complaints; headache; CT ANGIO HEAD NECK W WO CM (CODE STROKE); Neuro deficit, acute, stroke suspected. FINDINGS: CTA NECK: AORTIC ARCH AND ARCH VESSELS: No dissection or arterial injury. No significant stenosis of the brachiocephalic or subclavian arteries. CERVICAL CAROTID ARTERIES: No dissection, arterial injury, or hemodynamically significant stenosis by NASCET criteria. Tortuous cervical ICAs. CERVICAL VERTEBRAL ARTERIES: Abrupt tapering of the V4 segment of both vertebral arteries, concerning for early vasospasm, which could be masking an aneurysm. LUNGS AND MEDIASTINUM: Unremarkable. SOFT TISSUES: No acute abnormality. BONES: No acute abnormality. CTA HEAD: ANTERIOR CIRCULATION: No significant stenosis of the internal carotid arteries. No significant stenosis of the anterior cerebral arteries. No significant stenosis of the middle cerebral arteries. No aneurysm. POSTERIOR CIRCULATION: Persistent fetal origin of the right PCA. Abrupt tapering of the distal basilar artery, concerning for early vasospasm, which could be masking an aneurysm. No significant stenosis of the posterior cerebral arteries. No significant stenosis of the basilar artery. No significant stenosis of the vertebral arteries. OTHER: No dural venous sinus thrombosis on this non-dedicated study. IMPRESSION: 1. No definite aneurysm or vascular malformation is identified. Abrupt tapering of the V4 segment of both vertebral arteries and the distal basilar artery, concerning for early vasospasm, which could be masking an aneurysm. Code stroke results were communicated to Dr. Matthews at 4:26 pm on 11/20/2023 by phone. Electronically signed by: Ryan Chess MD 11/20/2023 04:33 PM EDT RP Workstation: [id number]    There are no new  results to review at this time.  Previous records (including but not limited to H&P, progress notes, nursing notes, TOC management) were reviewed in assessment of this patient.  Labs: CBC: Recent  Labs  Lab 12/01/23 0544 12/02/23 0643 12/03/23 0458 12/06/23 1054 12/07/23 0423  WBC 10.8* 12.5* 10.3 15.3* 9.8  HGB 11.8* 12.2 11.5* 12.7 12.2  HCT 34.4* 35.1* 33.2* 36.8 35.9*  MCV 80.2 80.1 80.0 80.7 81.4  PLT 317 342 357 442* 428*   Basic Metabolic Panel: Recent Labs  Lab 12/01/23 0544 12/01/23 0545 12/03/23 0458 12/03/23 0459 12/04/23 0613 12/05/23 0624 12/06/23 0447 12/06/23 1054 12/07/23 0423  NA  --    < >  --  134* 133* 133* 137 131* 132*  K  --    < >  --  3.6 4.5 3.6 3.7 3.5 4.2  CL  --    < >  --  107 102 99 103 100 101  CO2  --    < >  --  21* 19* 23 23 17* 21*  GLUCOSE  --    < >  --  113* 100* 99 101* 144* 118*  BUN  --    < >  --  19 14 15 19 21 21   CREATININE  --    < >  --  0.52 0.55 0.62 0.73 0.65 0.77  CALCIUM  --    < >  --  8.6* 9.1 9.4 9.4 9.0 9.3  MG 2.2  --  2.3  --   --   --   --   --   --   PHOS  --    < >  --  2.5 3.1 3.9 3.4  --  3.1   < > = values in this interval not displayed.   Liver Function Tests: Recent Labs  Lab 12/04/23 [id number] 12/05/23 0624 12/06/23 0447 12/06/23 1054 12/07/23 0423  AST  --   --   --  27  --   ALT  --   --   --  35  --   ALKPHOS  --   --   --  81  --   BILITOT  --   --   --  0.7  --   PROT  --   --   --  6.8  --   ALBUMIN 3.0* 2.9* 3.1* 3.3* 3.0*   CBG: Recent Labs  Lab 12/06/23 1955 12/06/23 2344 12/07/23 0404 12/07/23 0754 12/07/23 1150  GLUCAP 148* 116* 107* 188* 114*    Scheduled Meds:  bethanechol   10 mg Oral TID   Chlorhexidine  Gluconate Cloth  6 each Topical Daily   enoxaparin  (LOVENOX ) injection  40 mg Subcutaneous QPM   feeding supplement  237 mL Oral BID BM   fiber supplement (BANATROL TF)  60 mL Oral BID   levETIRAcetam   500 mg Oral BID   nicotine   14 mg Transdermal Daily   niMODipine    60 mg Oral Q4H   mouth rinse  15 mL Mouth Rinse Q4H   pantoprazole   40 mg Oral Daily   sodium chloride   2 g Oral TID   tamsulosin   0.4 mg Oral Daily   topiramate   25 mg Oral BID   Continuous Infusions:  promethazine  (PHENERGAN ) injection (IM or IVPB) Stopped (11/23/23 1952)   PRN Meds:.acetaminophen  **OR** acetaminophen  (TYLENOL ) oral liquid 160 mg/5 mL **OR** acetaminophen , butalbital -acetaminophen -caffeine , loperamide  HCl, ondansetron  (ZOFRAN ) IV, mouth rinse, oxyCODONE , promethazine  (PHENERGAN ) injection (IM or IVPB), white petrolatum   Family Communication: Family at bedside  Disposition: Status is: Inpatient Remains inpatient appropriate because: [facility]     Time spent: 36 minutes  Length of inpatient stay: 17 days  Author: Carliss  LELON Canales, DO 12/07/2023 1:29 PM  For on call review [email or web address].

## 2023-12-07 NOTE — PMR Pre-admission (Signed)
 PMR Admission Coordinator Pre-Admission Assessment  Patient: Andrea Bridges is an 63 y.o., female MRN: [id number] DOB: 09-Jun-1960 Height: 5' 7 (170.2 cm) Weight: 86.3 kg              Insurance Information HMO:     PPO: yes     PCP:      IPA:      80/20:      OTHER:  PRIMARY: Producer, television/film/video [employer])     Policy#: [id number]      Subscriber: pt CM Name: Amy      Phone#: [phone]     Fax#: [phone] Pre-Cert#: [id number] auth for CIR from Amy with updates due to her at fax listed above on 8/8      Employer:  Benefits:  Phone #: [phone]     Name:  Eff. Date: 05/11/23     Deduct: $3300 (met)      Out of Pocket Max: $5000 (met)      Life Max:   CIR: 80%      SNF: 80% Outpatient: 80%     Co-Pay: 20% Home Health: 80%      Co-Pay: 20% DME: 80%     Co-Pay: 20% Providers:  SECONDARY:       Policy#:       Phone#:   Artist:       Phone#:   The Engineer, materials Information Summary" for patients in Inpatient Rehabilitation Facilities with attached "Privacy Act Statement-Health Care Records" was provided and verbally reviewed with: Patient and Family  Emergency Contact Information Contact Information     Name Relation Home Work Mobile   [place] Daughter [phone]        Other Contacts     Name Relation Home Work Mobile   Walgren,Mike Significant other   [phone]      Current Medical History  Patient Admitting Diagnosis: nontraumatic SAH with hydrocephalus, s/p EVD (d/c on 7/27)  History of Present Illness: Pt is a 63 y/o female with PMH of anxiety, tobacco use, and skin cancer who admitted to [facility] on 11/20/23 with c/o of sudden onset thunderclap headache. She was initially AOx4, vomiting, and answering questions and then became unresponsive outside of noxious stimuli.  Code stroke called and pt was intubated for airway protection.  On Neurology arrival pt unresponsive, moaning, with disconjugate gaze, R hemiparesis, and not following commands. Her NIHSS was 27.   Labs revealed mild hypokalemia and hypocalcemia.  CT head revealed diffuse basilar SAH with IVH extension and early ventriculomegaly, greatest noted along the left cerebellopontine angle cistern.  CTA head/neck showed no definite aneurysm or vascular malformation, but did show concern for early vasospasm in V4 segment of bilateral vertebral arteries and distal basilar artery.  Neurosurgery was consulted and Dr. Gillie placed EVD at bedside on 7/13.  It was clamped on 7/25 and removed on 7/26.  On the morning on 7/29 pt developed acute AMS with decreased responsiveness and convulsing.  Stat CT stable from previous and EEG negative for seizures afterwards but did show mild/moderate diffuse encephalopathy.  She was started on keppra .  After this event she did have a fever and mildly elevated WBC which has resolved within 24 hours.  On 7/30 she was back to baseline.  Therapy ongoing and pt has been recommended for CIR.   Complete NIHSS TOTAL: 2 Glasgow Coma Scale Score: 15  Patient's medical record from [name] has been reviewed by the rehabilitation admission coordinator and physician.  Past Medical History  Past Medical History:  Diagnosis Date   Acute appendicitis with perforation and localized peritonitis, without abscess or gangrene 12/11/2019   Anxiety    Infertility, female    PCOS (polycystic ovarian syndrome)    Skin cancer    basal cell   SOBOE (shortness of breath on exertion) 09/08/2022    Has the patient had major surgery during 100 days prior to admission? Yes  Family History  family history includes Anxiety disorder in her mother; Cancer in her father; Obesity in her mother; Sleep apnea in her father; Thyroid  disease in her mother.   Current Medications   Current Facility-Administered Medications:    acetaminophen  (TYLENOL ) tablet 650 mg, 650 mg, Oral, Q4H PRN, 650 mg at 12/09/23 0946 **OR** acetaminophen  (TYLENOL ) 160 MG/5ML solution 650 mg, 650 mg, Per Tube, Q4H PRN, 650  mg at 12/02/23 0744 **OR** acetaminophen  (TYLENOL ) suppository 650 mg, 650 mg, Rectal, Q4H PRN, [name], MD   bethanechol  (URECHOLINE ) tablet 10 mg, 10 mg, Oral, TID, Alva, Rakesh V, MD, 10 mg at 12/09/23 9053   butalbital -acetaminophen -caffeine  (FIORICET ) 50-325-40 MG per tablet 1-2 tablet, 1-2 tablet, Oral, Q6H PRN, Cabbell, Kyle, MD, 1 tablet at 12/08/23 1421   Chlorhexidine  Gluconate Cloth 2 % PADS 6 each, 6 each, Topical, Daily, [name], MD, 6 each at 12/09/23 0950   enoxaparin  (LOVENOX ) injection 40 mg, 40 mg, Subcutaneous, QPM, Sethi, Pramod S, MD, 40 mg at 12/08/23 1810   feeding supplement (ENSURE PLUS HIGH PROTEIN) liquid 237 mL, 237 mL, Oral, BID BM, Clark, Laura P, DO, 237 mL at 12/07/23 1433   fiber supplement (BANATROL TF) liquid 60 mL, 60 mL, Oral, BID, Alva, Rakesh V, MD, 60 mL at 12/09/23 0946   levETIRAcetam  (KEPPRA ) tablet 500 mg, 500 mg, Oral, BID, Lanis Pupa, MD, 500 mg at 12/09/23 0945   loperamide  HCl (IMODIUM ) 1 MG/7.5ML suspension 2 mg, 2 mg, Oral, PRN, Alva, Rakesh V, MD   nicotine  (NICODERM CQ  - dosed in mg/24 hours) patch 14 mg, 14 mg, Transdermal, Daily, Jeanell, Paras, MD, 14 mg at 12/09/23 0947   [DISCONTINUED] niMODipine  (NIMOTOP ) capsule 60 mg, 60 mg, Oral, Q4H, 60 mg at 12/06/23 1500 **OR** niMODipine  (NYMALIZE ) 6 MG/ML oral solution 60 mg, 60 mg, Oral, Q4H, Elpidio Reyes DEL, MD, 60 mg at 12/09/23 0946   nystatin  (MYCOSTATIN ) 100000 UNIT/ML suspension 500,000 Units, 5 mL, Oral, QID, Arlon Carliss ORN, DO, 500,000 Units at 12/09/23 9048   ondansetron  (ZOFRAN ) injection 4 mg, 4 mg, Intravenous, Q6H PRN, Remi Pippin, NP, 4 mg at 12/06/23 9156   Oral care mouth rinse, 15 mL, Mouth Rinse, PRN, Chand, Sudham, MD   Oral care mouth rinse, 15 mL, Mouth Rinse, Q4H, Clark, Laura P, DO, 15 mL at 12/09/23 0951   oxyCODONE  (Oxy IR/ROXICODONE ) immediate release tablet 5 mg, 5 mg, Oral, Q4H PRN, Gretta Doffing P, DO, 5 mg at 12/03/23 0000   pantoprazole   (PROTONIX ) EC tablet 40 mg, 40 mg, Oral, Daily, 40 mg at 12/09/23 0946 **OR** [DISCONTINUED] pantoprazole  (PROTONIX ) injection 40 mg, 40 mg, Intravenous, Daily, [name], MD, 40 mg at 12/01/23 1021   promethazine  (PHENERGAN ) 12.5 mg in sodium chloride  0.9 % 50 mL IVPB, 12.5 mg, Intravenous, Q6H PRN, Cabbell, Kyle, MD, Stopped at 11/23/23 1952   sodium chloride  tablet 2 g, 2 g, Oral, TID, Alva, Rakesh V, MD, 2 g at 12/09/23 0946   sulfamethoxazole -trimethoprim  (BACTRIM  DS) 800-160 MG per tablet 1 tablet, 1 tablet, Oral, Q12H, Arlon Carliss ORN, DO, 1  tablet at 12/09/23 9053   tamsulosin  (FLOMAX ) capsule 0.8 mg, 0.8 mg, Oral, Daily, Arlon Honey W, DO, 0.8 mg at 12/09/23 0945   topiramate  (TOPAMAX ) tablet 25 mg, 25 mg, Oral, BID, Alva, Rakesh V, MD, 25 mg at 12/09/23 9053   white petrolatum  (VASELINE) gel, , Topical, PRN, Chand, Sudham, MD  Patients Current Diet:  Diet Order             Diet - low sodium heart healthy           Diet regular Room service appropriate? Yes; Fluid consistency: Thin  Diet effective now                   Precautions / Restrictions Precautions Precautions: Fall Precaution/Restrictions Comments: SBP <160 Restrictions Weight Bearing Restrictions Per Provider Order: No   Has the patient had 2 or more falls or a fall with injury in the past year?No  Prior Activity Level Community (5-7x/wk): independent without DME, driving, working full time  Prior Functional Level Prior Function Prior Level of Function : Independent/Modified Independent, Working/employed, Driving Mobility Comments: indep, no AD ADLs Comments: indep, works full time  Self Care: Did the patient need help bathing, dressing, using the toilet or eating?  Independent  Indoor Mobility: Did the patient need assistance with walking from room to room (with or without device)? Independent  Stairs: Did the patient need assistance with internal or external stairs (with or without  device)? Independent  Functional Cognition: Did the patient need help planning regular tasks such as shopping or remembering to take medications? Independent  Patient Information Are you of Hispanic, Latino/a,or Spanish origin?: A. No, not of Hispanic, Latino/a, or Spanish origin What is your race?: A. White Do you need or want an interpreter to communicate with a doctor or health care staff?: 0. No  Patient's Response To:  Health Literacy and Transportation Is the patient able to respond to health literacy and transportation needs?: Yes Health Literacy - How often do you need to have someone help you when you read instructions, pamphlets, or other written material from your doctor or pharmacy?: Never In the past 12 months, has lack of transportation kept you from medical appointments or from getting medications?: No In the past 12 months, has lack of transportation kept you from meetings, work, or from getting things needed for daily living?: No  Home Assistive Devices / Equipment Home Equipment: None  Prior Device Use: Indicate devices/aids used by the patient prior to current illness, exacerbation or injury? None of the above  Current Functional Level Cognition  Arousal/Alertness: Awake/alert Overall Cognitive Status: Impaired/Different from baseline Orientation Level: Oriented X4 Attention: Sustained Sustained Attention: Impaired Sustained Attention Impairment: Verbal basic, Verbal complex Memory: Impaired Memory Impairment: Storage deficit, Retrieval deficit Awareness: Impaired Awareness Impairment: Intellectual impairment, Emergent impairment Problem Solving: Impaired Problem Solving Impairment: Verbal complex, Functional complex Executive Function: Self Correcting Initiating: Impaired Initiating Impairment: Verbal basic, Verbal complex Self Correcting: Impaired Self Correcting Impairment: Functional complex, Functional basic, Verbal complex Safety/Judgment:  Impaired Comments: patient requiring cues to refrain from scratching head at surgical site and from pulling on Cortrak feeding tube    Extremity Assessment (includes Sensation/Coordination)  Upper Extremity Assessment: Generalized weakness (difficsult to assess. PROM is [facility]. vrey minimal active movement)  Lower Extremity Assessment: Defer to PT evaluation    ADLs  Overall ADL's : Needs assistance/impaired Eating/Feeding: Independent Grooming: Maximal assistance Upper Body Bathing: Moderate assistance, Sitting Lower Body Bathing: Total assistance Upper Body Dressing :  Maximal assistance Lower Body Dressing: Total assistance Lower Body Dressing Details (indicate cue type and reason): total A for socks Toilet Transfer: Total assistance Toilet Transfer Details (indicate cue type and reason): bed level Toileting- Clothing Manipulation and Hygiene: Total assistance, Sit to/from stand Functional mobility during ADLs: Total assistance General ADL Comments: pt is now total A for all aspects of self care/mobility and sitting balance.    Mobility  Overal bed mobility: Needs Assistance Bed Mobility: Rolling, Sidelying to Sit, Sit to Supine Rolling: Total assist Sidelying to sit: Total assist Supine to sit: Mod assist Sit to supine: Total assist Sit to sidelying: Max assist General bed mobility comments: very minimal activation or attempt to assist this date    Transfers  Overall transfer level: Needs assistance Equipment used: Rolling walker (2 wheels) Transfers: Sit to/from Stand Sit to Stand: Mod assist Bed to/from chair/wheelchair/BSC transfer type:: Step pivot Step pivot transfers: Mod assist, +2 safety/equipment General transfer comment: unable to progress beyond EOB    Ambulation / Gait / Stairs / Wheelchair Mobility  Ambulation/Gait Ambulation/Gait assistance: Mod assist Gait Distance (Feet): 5 Feet Assistive device: Rolling walker (2 wheels) Gait Pattern/deviations:  Step-through pattern, Decreased stride length, Trunk flexed, Knees buckling General Gait Details: Cues for sequencing, increased step length, and RW proximity. Assist for RW management and balance. Pts knees noted to increase flexion with increased fatigue with pt unable to achieve extension requiring cues for seated rest. Increased effort to advance BLE Gait velocity: Decreased Gait velocity interpretation: <1.8 ft/sec, indicate of risk for recurrent falls Pre-gait activities: Marching in place w/ RW    Posture / Balance Dynamic Sitting Balance Sitting balance - Comments: posterior and L lateral lean -max-total A needed for sitting balance to prevent fall Balance Overall balance assessment: Needs assistance Sitting-balance support: Feet supported Sitting balance-Leahy Scale: Poor Sitting balance - Comments: posterior and L lateral lean -max-total A needed for sitting balance to prevent fall Postural control: Posterior lean Standing balance support: Bilateral upper extremity supported, Reliant on assistive device for balance, During functional activity Standing balance-Leahy Scale: Poor Standing balance comment: Pt reliant on RW and min-mod A to maintain standing balance due to posterior lean    Special considerations/ Life events Diabetic management A1C 5.5     Previous Home Environment (from acute therapy documentation) Living Arrangements: Children  Lives With: Spouse Available Help at Discharge: Family, Friend(s), Available PRN/intermittently Type of Home: House Home Layout: One level Home Access: Stairs to enter Entrance Stairs-Rails: None Entrance Stairs-Number of Steps: one step down, two steps up [employer] Shower/Tub: Psychologist, counselling, Barista unit, Sport and exercise psychologist: Standard Home Care Services: No  Discharge Living Setting Plans for Discharge Living Setting: Lives with (comment) (brother, David's) Type of Home at Discharge: House Discharge Home Layout: Two level,  1/2 bath on main level, Other (Comment) (can set up downstairs office as bedroom) Alternate Level Stairs-Number of Steps: full flight Discharge Bathroom Shower/Tub: Tub/shower unit, Walk-in shower Discharge Bathroom Toilet: Standard Discharge Bathroom Accessibility: Yes How Accessible: Accessible via walker Does the patient have any problems obtaining your medications?: No  Social/Family/Support Systems Patient Roles: Parent Contact Information: daughter, [name], is a Holiday representative at [employer] Anticipated Caregiver: Daughter [name] is coordinating contact, brother alm and his family will provide care (spoke to his daughter Manuelita as well) Anticipated Caregiver's Contact Information: [phone] Ability/Limitations of Caregiver: none stated Caregiver Availability: 24/7 Discharge Plan Discussed with Primary Caregiver: Yes Is Caregiver In Agreement with Plan?: Yes Does Caregiver/Family have Issues with Lodging/Transportation while Pt  is in Rehab?: No   Goals Patient/Family Goal for Rehab: PT/OT/SLP supervision Expected length of stay: 10-14 days Additional Information: Discharge plan: pt will d/c to her brother's home in [place].  Alm and his wife are retired and can provide 24/7 assist.  Their daughter works remotely and can provide additional assist during the day if needed.  They are able to set [name] up on the first floor if she's not able to access guest bed/bath on second floor Pt/Family Agrees to Admission and willing to participate: Yes Program Orientation Provided & Reviewed with Pt/Caregiver Including Roles  & Responsibilities: Yes   Decrease burden of Care through IP rehab admission: no    Possible need for SNF placement upon discharge: Not anticipated.  Plan for pt to stay with her brother, Alm, and his wife at discharge.  They are retired and can provide expected level of care.    Patient Condition: This patient's condition remains as documented in the consult dated  12/06/23, in which the Rehabilitation Physician determined and documented that the patient's condition is appropriate for intensive rehabilitative care in an inpatient rehabilitation facility. Will admit to inpatient rehab today.  Preadmission Screen Completed By:  Reche FORBES Lowers, PT, DPT 12/09/2023 12:41 PM ______________________________________________________________________   Discussed status with Dr. Urbano on 12/09/23 at 12:41 PM  and received approval for admission today.  Admission Coordinator:  Caitlin E Warren, PT, DPT time 12:41 PM Pattricia 12/09/23

## 2023-12-07 NOTE — Progress Notes (Signed)
 Physical Therapy Treatment Patient Details Name: Andrea Bridges MRN: [id number] DOB: 04/24/1961 Today's Date: 12/07/2023   History of Present Illness Pt is a 63 y.o. female who presented 11/20/23 with a headache. Upon arrival to ED, she became nauseated and unresponsive. 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 S/p R frontal ventricular catheter placement 7/13. ETT 7/13-7/14. Cortrak placed 7/14. S/p diagnostic cerebral angiogram 7/15, but prematurely aborted due to pt agitation. 7/21 new EVD placed overnight early AM due to malfunction of old catheter, plan for repeat angiogram 7/22. S/p EVD removal 7/26. PMH: anxiety, skin cancer    PT Comments  Pt received in supine and agreeable to session. Pt demonstrates improved alertness and participation this session. Pt able to tolerate multiple standing trials and a short gait trial with up to mod A due to weakness. Pt requires cues for seated rest breaks during ambulation due to noted B knee flexion and increased instability. Pt attempted further distance, however pt unable to advance BLE due to weakness. Pt continues to benefit from PT services to progress toward functional mobility goals.     If plan is discharge home, recommend the following: Assistance with cooking/housework;Direct supervision/assist for medications management;Direct supervision/assist for financial management;Assist for transportation;Help with stairs or ramp for entrance;Supervision due to cognitive status;A lot of help with walking and/or transfers;A lot of help with bathing/dressing/bathroom   Can travel by private vehicle        Equipment Recommendations  Other (comment) (TBD)    Recommendations for Other Services       Precautions / Restrictions Precautions Precautions: Fall Recall of Precautions/Restrictions: Impaired Precaution/Restrictions Comments: SBP <160 Restrictions Weight Bearing Restrictions Per Provider Order: No     Mobility  Bed  Mobility Overal bed mobility: Needs Assistance Bed Mobility: Supine to Sit     Supine to sit: Mod assist     General bed mobility comments: cues for sequencing and mod A for trunk elevation. increased time for initiation    Transfers Overall transfer level: Needs assistance Equipment used: Rolling walker (2 wheels) Transfers: Sit to/from Stand Sit to Stand: Mod assist           General transfer comment: From EOB and recliner with mod A for power up and cues for hand placement    Ambulation/Gait Ambulation/Gait assistance: Mod assist Gait Distance (Feet): 5 Feet Assistive device: Rolling walker (2 wheels) Gait Pattern/deviations: Step-through pattern, Decreased stride length, Trunk flexed, Knees buckling Gait velocity: Decreased     General Gait Details: Cues for sequencing, increased step length, and RW proximity. Assist for RW management and balance. Pts knees noted to increase flexion with increased fatigue with pt unable to achieve extension requiring cues for seated rest. Increased effort to advance BLE   Stairs             Wheelchair Mobility     Tilt Bed    Modified Rankin (Stroke Patients Only) Modified Rankin (Stroke Patients Only) Pre-Morbid Rankin Score: No symptoms Modified Rankin: Moderately severe disability     Balance Overall balance assessment: Needs assistance Sitting-balance support: Feet supported, Bilateral upper extremity supported Sitting balance-Leahy Scale: Poor Sitting balance - Comments: Posterior lean requiring cues and min-mod A to correct. Pt improved to CGA with RW in front to lean on Postural control: Posterior lean Standing balance support: Bilateral upper extremity supported, Reliant on assistive device for balance, During functional activity Standing balance-Leahy Scale: Poor Standing balance comment: Pt reliant on RW and min-mod A to maintain standing  balance due to posterior lean                             Communication Communication Communication: Impaired Factors Affecting Communication: Difficulty expressing self;Reduced clarity of speech  Cognition Arousal: Alert Behavior During Therapy: Flat affect   PT - Cognitive impairments: Sequencing, Problem solving, Initiation, Safety/Judgement                         Following commands: Impaired Following commands impaired: Follows one step commands with increased time    Cueing Cueing Techniques: Verbal cues, Tactile cues  Exercises General Exercises - Lower Extremity Hip Flexion/Marching: AROM, Standing, Both, 5 reps    General Comments        Pertinent Vitals/Pain Pain Assessment Pain Assessment: No/denies pain     PT Goals (current goals can now be found in the care plan section) Acute Rehab PT Goals Patient Stated Goal: to improve PT Goal Formulation: With patient/family Time For Goal Achievement: 12/07/23 Progress towards PT goals: Progressing toward goals    Frequency    Min 2X/week       AM-PAC PT 6 Clicks Mobility   Outcome Measure  Help needed turning from your back to your side while in a flat bed without using bedrails?: A Little Help needed moving from lying on your back to sitting on the side of a flat bed without using bedrails?: A Little Help needed moving to and from a bed to a chair (including a wheelchair)?: A Lot Help needed standing up from a chair using your arms (e.g., wheelchair or bedside chair)?: A Lot Help needed to walk in hospital room?: Total (>90ft) Help needed climbing 3-5 steps with a railing? : Total 6 Click Score: 12    End of Session Equipment Utilized During Treatment: Gait belt Activity Tolerance: Patient limited by fatigue;Patient tolerated treatment well Patient left: in chair;with call bell/phone within reach;with family/visitor present Nurse Communication: Mobility status PT Visit Diagnosis: Unsteadiness on feet (R26.81);Other abnormalities of gait and  mobility (R26.89);Pain     Time: 1350-1419 PT Time Calculation (min) (ACUTE ONLY): 29 min  Charges:    $Gait Training: 8-22 mins $Therapeutic Activity: 8-22 mins PT General Charges $$ ACUTE PT VISIT: 1 Visit                     Darryle George, PTA Acute Rehabilitation Services Secure Chat Preferred  [phone]    Darryle George 12/07/2023, 3:50 PM

## 2023-12-07 NOTE — Progress Notes (Signed)
 Inpatient Rehab Admissions Coordinator:   I spoke to pt's daughter on the phone for update.  Pt looks much better per NS this morning.  We reviewed need for 24/7 supervision and [name] relays that pt's brother, Alm Macho, will have her come stay with him at discharge.  They have B/B upstairs but pt able to set up on the main floor if needed.  Alm and his spouse are retired and their daughter works remotely so can be present during the days as well if needed.  We reviewed insurance auth and I will start this process today.    Reche Lowers, PT, DPT Admissions Coordinator [phone] 12/07/23  10:07 AM

## 2023-12-07 NOTE — TOC Progression Note (Signed)
 Transition of Care [facility]) - Progression Note    Patient Details  Name: Somalia Segler MRN: [id number] Date of Birth: 08-Feb-1961  Transition of Care [facility]) CM/SW Contact  Andrez JULIANNA George, RN Phone Number: 12/07/2023, 1:21 PM  Clinical Narrative:     CIR starting insurance authorization.  IP Care Management following.  Expected Discharge Plan: IP Rehab Facility Barriers to Discharge: English as a second language teacher, Continued Medical Work up               Expected Discharge Plan and Services   Discharge Planning Services: CM Consult Post Acute Care Choice: NA Living arrangements for the past 2 months: Single Family Home                 DME Arranged: N/A         HH Arranged: NA           Social Drivers of Health (SDOH) Interventions SDOH Screenings   Food Insecurity: Patient Unable To Answer (11/20/2023)  Housing: Patient Unable To Answer (11/20/2023)  Transportation Needs: Patient Unable To Answer (11/20/2023)  Utilities: Patient Unable To Answer (11/20/2023)  Depression (PHQ2-9): Low Risk  (09/22/2023)  Social Connections: Unknown (09/22/2021)   Received from [facility]  Tobacco Use: High Risk (12/02/2023)    Readmission Risk Interventions    11/21/2023    4:34 PM  Readmission Risk Prevention Plan  Post Dischage Appt Complete  Medication Screening Complete  Transportation Screening Complete

## 2023-12-08 ENCOUNTER — Inpatient Hospital Stay (HOSPITAL_COMMUNITY)

## 2023-12-08 DIAGNOSIS — R338 Other retention of urine: Secondary | ICD-10-CM | POA: Diagnosis not present

## 2023-12-08 DIAGNOSIS — I609 Nontraumatic subarachnoid hemorrhage, unspecified: Secondary | ICD-10-CM | POA: Diagnosis not present

## 2023-12-08 LAB — RENAL FUNCTION PANEL
Albumin: 3 g/dL — ABNORMAL LOW (ref 3.5–5.0)
Anion gap: 8 (ref 5–15)
BUN: 17 mg/dL (ref 8–23)
CO2: 22 mmol/L (ref 22–32)
Calcium: 9.1 mg/dL (ref 8.9–10.3)
Chloride: 103 mmol/L (ref 98–111)
Creatinine, Ser: 0.71 mg/dL (ref 0.44–1.00)
GFR, Estimated: >60 mL/min (ref >60)
Glucose, Bld: 104 mg/dL — ABNORMAL HIGH (ref 70–99)
Phosphorus: 2.4 mg/dL — ABNORMAL LOW (ref 2.5–4.6)
Potassium: 3.5 mmol/L (ref 3.5–5.1)
Sodium: 133 mmol/L — ABNORMAL LOW (ref 135–145)

## 2023-12-08 LAB — BLOOD CULTURE ID PANEL (REFLEXED) - BCID2

## 2023-12-08 LAB — CBC
HCT: 36 % (ref 36.0–46.0)
Hemoglobin: 12.2 g/dL (ref 12.0–15.0)
MCH: 27.7 pg (ref 26.0–34.0)
MCHC: 33.9 g/dL (ref 30.0–36.0)
MCV: 81.6 fL (ref 80.0–100.0)
Platelets: 383 K/uL (ref 150–400)
RBC: 4.41 MIL/uL (ref 3.87–5.11)
RDW: 14.6 % (ref 11.5–15.5)
WBC: 6.6 K/uL (ref 4.0–10.5)
nRBC: 0 % (ref 0.0–0.2)

## 2023-12-08 LAB — URINALYSIS, ROUTINE W REFLEX MICROSCOPIC
Bilirubin Urine: NEGATIVE
Glucose, UA: NEGATIVE mg/dL
Hgb urine dipstick: NEGATIVE
Ketones, ur: NEGATIVE mg/dL
Nitrite: NEGATIVE
Protein, ur: NEGATIVE mg/dL
Specific Gravity, Urine: 1.016 (ref 1.005–1.030)
pH: 5 (ref 5.0–8.0)

## 2023-12-08 LAB — GLUCOSE, CAPILLARY
Glucose-Capillary: 111 mg/dL — ABNORMAL HIGH (ref 70–99)
Glucose-Capillary: 126 mg/dL — ABNORMAL HIGH (ref 70–99)
Glucose-Capillary: 109 mg/dL — ABNORMAL HIGH (ref 70–99)
Glucose-Capillary: 158 mg/dL — ABNORMAL HIGH (ref 70–99)
Glucose-Capillary: 98 mg/dL (ref 70–99)
Glucose-Capillary: 131 mg/dL — ABNORMAL HIGH (ref 70–99)

## 2023-12-08 MED ORDER — TAMSULOSIN HCL 0.4 MG PO CAPS
0.8000 mg | ORAL_CAPSULE | Freq: Every day | ORAL | Status: DC
  Administered 2023-12-09: 0.8 mg via ORAL
  Filled 2023-12-08: qty 2

## 2023-12-08 MED ORDER — SULFAMETHOXAZOLE-TRIMETHOPRIM 800-160 MG PO TABS: 1.0000 | ORAL_TABLET | Freq: Two times a day (BID) | ORAL | Status: DC

## 2023-12-08 MED ORDER — NYSTATIN 100000 UNIT/ML MT SUSP
5.0000 mL | Freq: Four times a day (QID) | OROMUCOSAL | Status: DC
  Administered 2023-12-08 – 2023-12-09 (×7): 500000 [IU] via ORAL
  Filled 2023-12-08 (×5): qty 5

## 2023-12-08 MED ORDER — SULFAMETHOXAZOLE-TRIMETHOPRIM 800-160 MG PO TABS
1.0000 | ORAL_TABLET | Freq: Two times a day (BID) | ORAL | Status: DC
  Administered 2023-12-08 – 2023-12-09 (×2): 1 via ORAL
  Filled 2023-12-08 (×2): qty 1

## 2023-12-08 NOTE — Progress Notes (Addendum)
 Occupational Therapy Treatment Patient Details Name: Andrea Bridges MRN: [id number] DOB: May 13, 1960 Today's Date: 12/08/2023   History of present illness Pt is a 63 y.o. female who presented 11/20/23 with a headache. Upon arrival to ED, she became nauseated and unresponsive. 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 S/p R frontal ventricular catheter placement 7/13. ETT 7/13-7/14. Cortrak placed 7/14. S/p diagnostic cerebral angiogram 7/15, but prematurely aborted due to pt agitation. 7/21 new EVD placed overnight early AM due to malfunction of old catheter, plan for repeat angiogram 7/22. S/p EVD removal 7/26. PMH: anxiety, skin cancer   OT comments  Pt is making limited progress towards their acute OT goals. Overall pt required total A for bed mobility with very limited active participate despite maximal multimodal cues. Once sitting EOB pt have very poor midline awareness and needed max-total A to correct posterior and L lateral lean. Attempted to facilitate forward posture with RW placed in front of pt which did help minimally, although pt still required assist for midline posture. R&L elbow propping completed, however pt reported back pain with activity. Throughout session, pt noted to have L inattention and maintained R gaze with very poor visual attention. OT to continue to follow acutely to facilitate progress towards established goals. Pt will continue to benefit from intensive inpatient follow up therapy, >3 hours/day after discharge.       If plan is discharge home, recommend the following:  A lot of help with bathing/dressing/bathroom;A lot of help with walking and/or transfers;Assistance with cooking/housework;Assist for transportation;Direct supervision/assist for financial management;Direct supervision/assist for medications management;Supervision due to cognitive status;Help with stairs or ramp for entrance   Equipment Recommendations  BSC/3in1;Tub/shower seat        Precautions / Restrictions Precautions Precautions: Fall Recall of Precautions/Restrictions: Impaired Precaution/Restrictions Comments: SBP <160 Restrictions Weight Bearing Restrictions Per Provider Order: No       Mobility Bed Mobility Overal bed mobility: Needs Assistance Bed Mobility: Rolling, Sidelying to Sit, Sit to Supine Rolling: Total assist Sidelying to sit: Total assist   Sit to supine: Total assist   General bed mobility comments: very minimal activation or attempt to assist this date    Transfers                   General transfer comment: unable to progress beyond EOB     Balance Overall balance assessment: Needs assistance Sitting-balance support: Feet supported Sitting balance-Leahy Scale: Poor Sitting balance - Comments: posterior and L lateral lean -max-total A needed for sitting balance to prevent fall         ADL either performed or assessed with clinical judgement   ADL Overall ADL's : Needs assistance/impaired         Functional mobility during ADLs: Total assistance General ADL Comments: pt is now total A for all aspects of self care/mobility and sitting balance.    Extremity/Trunk Assessment Upper Extremity Assessment Upper Extremity Assessment: Generalized weakness (difficsult to assess. PROM is [facility] vrey minimal active movement)   Lower Extremity Assessment Lower Extremity Assessment: Defer to PT evaluation        Vision   Vision Assessment?: Vision impaired- to be further tested in functional context Additional Comments: eyes opening, very poor visual attention. R inattention with L gaze throughout   Perception Perception Perception: Impaired Preception Impairment Details: Inattention/Neglect   Praxis Praxis Praxis: Not tested   Communication Communication Communication: Impaired Factors Affecting Communication: Difficulty expressing self;Reduced clarity of speech   Cognition Arousal: Alert  Behavior During  Therapy: Flat affect Cognition: Cognition impaired Difficult to assess due to: Level of arousal       Attention impairment (select first level of impairment): Sustained attention   OT - Cognition Comments: contintues to be extremely flat wtih very limited active participation or command following                 Following commands: Impaired Following commands impaired: Follows one step commands inconsistently      Cueing   Cueing Techniques: Verbal cues, Tactile cues        General Comments VSS, family present    Pertinent Vitals/ Pain       Pain Assessment Pain Assessment: Faces Faces Pain Scale: Hurts whole lot Pain Location: head, back, legs Pain Descriptors / Indicators: Headache Pain Intervention(s): Limited activity within patient's tolerance, Monitored during session   Frequency  Min 2X/week        Progress Toward Goals  OT Goals(current goals can now be found in the care plan section)  Progress towards OT goals: Not progressing toward goals - comment  Acute Rehab OT Goals Patient Stated Goal: less pain OT Goal Formulation: With patient/family Time For Goal Achievement: 12/20/23 Potential to Achieve Goals: Fair ADL Goals Pt Will Perform Grooming: with set-up;sitting Pt Will Perform Upper Body Dressing: with set-up;sitting Pt Will Perform Lower Body Dressing: with min assist;sit to/from stand Pt Will Transfer to Toilet: with min assist;ambulating Additional ADL Goal #1: pt will follow simple 1 step commands 100% of sesssion Additional ADL Goal #2: Pt will indep complete 3 step trail making task to demonstrate improved cognition for higher level IADLs   AM-PAC OT 6 Clicks Daily Activity     Outcome Measure   Help from another person eating meals?: Total Help from another person taking care of personal grooming?: Total Help from another person toileting, which includes using toliet, bedpan, or urinal?: Total Help from another person bathing  (including washing, rinsing, drying)?: Total Help from another person to put on and taking off regular upper body clothing?: Total Help from another person to put on and taking off regular lower body clothing?: Total 6 Click Score: 6    End of Session Equipment Utilized During Treatment: Gait belt;Rolling walker (2 wheels)  OT Visit Diagnosis: Unsteadiness on feet (R26.81);Other abnormalities of gait and mobility (R26.89);Muscle weakness (generalized) (M62.81)   Activity Tolerance Patient limited by pain   Patient Left in bed;with call bell/phone within reach;with bed alarm set;with family/visitor present   Nurse Communication Mobility status        Time: 1303-1330 OT Time Calculation (min): 27 min  Charges: OT General Charges $OT Visit: 1 Visit OT Treatments $Therapeutic Activity: 23-37 mins  Lucie Kendall, OTR/L Acute Rehabilitation Services Office [phone] Secure Chat Communication Preferred   [name] 12/08/2023, 2:05 PM

## 2023-12-08 NOTE — Plan of Care (Signed)
  Problem: Education: Goal: Knowledge of disease or condition will improve Outcome: Progressing Goal: Knowledge of secondary prevention will improve (MUST DOCUMENT ALL) Outcome: Progressing Goal: Knowledge of patient specific risk factors will improve (DELETE if not current risk factor) Outcome: Progressing   Problem: Spontaneous Subarachnoid Hemorrhage Tissue Perfusion: Goal: Complications of Spontaneous Subarachnoid Hemorrhage will be minimized Outcome: Progressing

## 2023-12-08 NOTE — H&P (Signed)
 Physical Medicine and Rehabilitation Admission H&P     HPI: Andrea Bridges is a 63 year old right-handed female with history significant for polycystic ovarian syndrome, anxiety, tobacco use, class I obesity with BMI 29.80.  Per chart review patient lives with family.  1 level home with one-step to enter.  Independent prior to admission working full-time.  Plans to stay with her brother and sister-in-law on discharge at 24/7 assist as needed as well as good support from extended family.  Presented 11/20/2023 with headache and altered mental status while working out in the yard.  Cranial CT scan showed diffuse basilar subarachnoid hemorrhage with intraventricular extension and early ventriculomegaly, greatest along the left cerebellopontine angle cistern.  CTA showed no definite aneurysm or vascular malformation.  Abrupt tapering of the V4 segment of both vertebral arteries of the distal basilar artery concerning for early vasospasm.  Patient was loaded with Keppra .  EEG suggestive of moderate to severe diffuse encephalopathy without seizure.  Admission chemistries unremarkable except WBC 14,700, potassium 3.2, glucose 179, urine drug screen negative.  Underwent right frontal ventricular catheter placement 11/20/2023 for early obstructive hydrocephalus per Dr. Gillie.  Cerebral angiogram aborted early due to agitation and restlessness showing no aneurysms AVMs or fistulas with repeat angiogram 11/29/2023 showing no intracranial aneurysms AVM or fistula..  Patient had episode of increased altered mental status and shaking 12/06/2023 initially nonresponsive not following commands head CT completed it was negative EEG repeated showing mild to moderate diffuse encephalopathy again without seizure and recommendations were to continue Keppra .  Patient did spike a fever with mild leukocytosis question related to encephalopathy CSF cultures no growth to date findings of E. coli UTI resistant to ampicillin intermediate  to Unasyn completing course of Ancef  and latest white count 6600..  Flomax  and Urecholine  were added for some urinary retention after initial Foley tube removed.  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  She was placed on restricted free water as well as salt tablets.  A nasogastric tube initially and placed for nutritional support her diet has been advanced to regular.  Patient was cleared to begin Lovenox  for DVT prophylaxis.  Topamax  ongoing for bouts of headaches.  She does remain on Nimotop  60 mg every 4 hours.  Therapy evaluations completed due to patient decreased functional mobility was admitted for a comprehensive rehab program.  Review of Systems  Constitutional:  Negative for chills and fever.  HENT:  Negative for hearing loss.   Eyes:  Negative for blurred vision and double vision.  Respiratory:  Negative for cough.        Occasional shortness of breath with exertion  Cardiovascular:  Negative for chest pain, palpitations and leg swelling.  Gastrointestinal:  Positive for nausea and vomiting.  Genitourinary:  Negative for dysuria, flank pain and hematuria.  Musculoskeletal:  Positive for myalgias.  Skin:  Negative for rash.  Neurological:  Positive for weakness and headaches.  Psychiatric/Behavioral:         Anxiety  All other systems reviewed and are negative.  Past Medical History:  Diagnosis Date   Acute appendicitis with perforation and localized peritonitis, without abscess or gangrene 12/11/2019   Anxiety    Infertility, female    PCOS (polycystic ovarian syndrome)    Skin cancer    basal cell   SOBOE (shortness of breath on exertion) 09/08/2022   Past Surgical History:  Procedure Laterality Date   APPENDECTOMY  2021   BREAST  BIOPSY     IR ANGIO INTRA EXTRACRAN SEL INTERNAL CAROTID BILAT MOD SED  11/29/2023   IR ANGIO VERTEBRAL SEL VERTEBRAL BILAT MOD SED  11/29/2023    IR ANGIO VERTEBRAL SEL VERTEBRAL UNI R MOD SED  11/22/2023   IR US  GUIDE VASC ACCESS RIGHT  11/29/2023   Family History  Problem Relation Age of Onset   Thyroid  disease Mother    Anxiety disorder Mother    Obesity Mother    Cancer Father        skin   Sleep apnea Father    Social History:  reports that she has been smoking cigarettes and e-cigarettes. She has never used smokeless tobacco. She reports that she does not currently use alcohol. She reports that she does not use drugs. Allergies: No Known Allergies Medications Prior to Admission  Medication Sig Dispense Refill   ibuprofen (ADVIL) 200 MG tablet Take 400 mg by mouth every 6 (six) hours as needed for moderate pain (pain score 4-6).     Semaglutide -Weight Management (WEGOVY ) 1.7 MG/0.75ML SOAJ Inject 1.7 mg into the skin once a week. 3 mL 1   Vitamin D , Ergocalciferol , (DRISDOL ) 1.25 MG (50000 UNIT) CAPS capsule Take 1 capsule (50,000 Units total) by mouth every 7 (seven) days. 12 capsule 0      Home: Home Living Family/patient expects to be discharged to:: Private residence Living Arrangements: Children Available Help at Discharge: Family, Friend(s), Available PRN/intermittently Type of Home: House Home Access: Stairs to enter [employer] of Steps: one step down, two steps up Entrance Stairs-Rails: None Home Layout: One level Bathroom Shower/Tub: Psychologist, counselling, Hydrographic surveyor, Sport and exercise psychologist: Standard Home Equipment: None  Lives With: Spouse   Functional History: Prior Function Prior Level of Function : Independent/Modified Independent, Working/employed, Driving Mobility Comments: indep, no AD ADLs Comments: indep, works full time  Functional Status:  Mobility: Bed Mobility Overal bed mobility: Needs Assistance Bed Mobility: Supine to Sit Rolling: Mod assist, Used rails Sidelying to sit: Mod assist Supine to sit: Mod assist Sit to supine: Max assist Sit to sidelying: Max assist General  bed mobility comments: cues for sequencing and mod A for trunk elevation. increased time for initiation Transfers Overall transfer level: Needs assistance Equipment used: Rolling walker (2 wheels) Transfers: Sit to/from Stand Sit to Stand: Mod assist Bed to/from chair/wheelchair/BSC transfer type:: Step pivot Step pivot transfers: Mod assist, +2 safety/equipment General transfer comment: From EOB and recliner with mod A for power up and cues for hand placement Ambulation/Gait Ambulation/Gait assistance: Mod assist Gait Distance (Feet): 5 Feet Assistive device: Rolling walker (2 wheels) Gait Pattern/deviations: Step-through pattern, Decreased stride length, Trunk flexed, Knees buckling General Gait Details: Cues for sequencing, increased step length, and RW proximity. Assist for RW management and balance. Pts knees noted to increase flexion with increased fatigue with pt unable to achieve extension requiring cues for seated rest. Increased effort to advance BLE Gait velocity: Decreased Gait velocity interpretation: <1.8 ft/sec, indicate of risk for recurrent falls Pre-gait activities: Marching in place w/ RW    ADL: ADL Overall ADL's : Needs assistance/impaired Eating/Feeding: Independent Grooming: Maximal assistance Upper Body Bathing: Moderate assistance, Sitting Lower Body Bathing: Total assistance Upper Body Dressing : Maximal assistance Lower Body Dressing: Total assistance Lower Body Dressing Details (indicate cue type and reason): total A for socks Toilet Transfer: Total assistance Toilet Transfer Details (indicate cue type and reason): bed level Toileting- Clothing Manipulation and Hygiene: Total assistance, Sit to/from stand Functional mobility during ADLs: Maximal  assistance (bed level) General ADL Comments: pt had a status change this am, now presents with low LOA, poor command following, poor sitting balance and was unable to progress to  standing  Cognition: Cognition Overall Cognitive Status: Impaired/Different from baseline Arousal/Alertness: Awake/alert Orientation Level: Oriented to person, Oriented to place, Oriented to situation Year: 2025 Month: July Day of Week: Incorrect Attention: Sustained Sustained Attention: Impaired Sustained Attention Impairment: Verbal basic, Verbal complex Memory: Impaired Memory Impairment: Storage deficit, Retrieval deficit Awareness: Impaired Awareness Impairment: Intellectual impairment, Emergent impairment Problem Solving: Impaired Problem Solving Impairment: Verbal complex, Functional complex Executive Function: Self Correcting Initiating: Impaired Initiating Impairment: Verbal basic, Verbal complex Self Correcting: Impaired Self Correcting Impairment: Functional complex, Functional basic, Verbal complex Safety/Judgment: Impaired Comments: patient requiring cues to refrain from scratching head at surgical site and from pulling on Cortrak feeding tube Cognition Arousal: Alert Behavior During Therapy: Flat affect Overall Cognitive Status: Impaired/Different from baseline  Physical Exam: Blood pressure (!) 107/56, pulse 73, temperature 98.6 F (37 C), temperature source Oral, resp. rate 18, height 5' 7 (1.702 m), weight 86.3 kg, SpO2 100%. Physical Exam Neurological:     Comments: Patient is alert.  Makes eye contact with examiner.  Oriented to person but not biographical information.  She was inconsistent with following 2 and three-step commands.     Results for orders placed or performed during the hospital encounter of 11/20/23 (from the past 48 hours)  Glucose, capillary     Status: Abnormal   Collection Time: 12/06/23 12:17 PM  Result Value Ref Range   Glucose-Capillary 200 (H) 70 - 99 mg/dL    Comment: Glucose reference range applies only to samples taken after fasting for at least 8 hours.   Comment 1 Notify RN    Comment 2 Document in Chart   Glucose,  capillary     Status: Abnormal   Collection Time: 12/06/23  3:47 PM  Result Value Ref Range   Glucose-Capillary 123 (H) 70 - 99 mg/dL    Comment: Glucose reference range applies only to samples taken after fasting for at least 8 hours.   Comment 1 Notify RN    Comment 2 Document in Chart   Glucose, capillary     Status: Abnormal   Collection Time: 12/06/23  7:55 PM  Result Value Ref Range   Glucose-Capillary 148 (H) 70 - 99 mg/dL    Comment: Glucose reference range applies only to samples taken after fasting for at least 8 hours.   Comment 1 Notify RN    Comment 2 Document in Chart   Urinalysis, w/ Reflex to Culture (Infection Suspected) -Urine, Clean Catch     Status: Abnormal   Collection Time: 12/06/23  9:00 PM  Result Value Ref Range   Specimen Source URINE, CLEAN CATCH    Color, Urine YELLOW YELLOW   APPearance HAZY (A) CLEAR   Specific Gravity, Urine 1.019 1.005 - 1.030   pH 5.0 5.0 - 8.0   Glucose, UA NEGATIVE NEGATIVE mg/dL   Hgb urine dipstick NEGATIVE NEGATIVE   Bilirubin Urine NEGATIVE NEGATIVE   Ketones, ur NEGATIVE NEGATIVE mg/dL   Protein, ur NEGATIVE NEGATIVE mg/dL   Nitrite POSITIVE (A) NEGATIVE   Leukocytes,Ua NEGATIVE NEGATIVE   RBC / HPF 0-5 0 - 5 RBC/hpf   WBC, UA 6-10 0 - 5 WBC/hpf    Comment:        Reflex urine culture not performed if WBC <=10, OR if Squamous epithelial cells >5. If Squamous epithelial cells >5  suggest recollection.    Bacteria, UA FEW (A) NONE SEEN   Squamous Epithelial / HPF 0-5 0 - 5 /HPF   Mucus PRESENT     Comment: Performed at [facility], [address] [place], [place] [address]  Glucose, capillary     Status: Abnormal   Collection Time: 12/06/23 11:44 PM  Result Value Ref Range   Glucose-Capillary 116 (H) 70 - 99 mg/dL    Comment: Glucose reference range applies only to samples taken after fasting for at least 8 hours.   Comment 1 Notify RN    Comment 2 Document in Chart   Glucose, capillary     Status: Abnormal    Collection Time: 12/07/23  4:04 AM  Result Value Ref Range   Glucose-Capillary 107 (H) 70 - 99 mg/dL    Comment: Glucose reference range applies only to samples taken after fasting for at least 8 hours.   Comment 1 Notify RN    Comment 2 Document in Chart   Renal function panel     Status: Abnormal   Collection Time: 12/07/23  4:23 AM  Result Value Ref Range   Sodium 132 (L) 135 - 145 mmol/L   Potassium 4.2 3.5 - 5.1 mmol/L   Chloride 101 98 - 111 mmol/L   CO2 21 (L) 22 - 32 mmol/L   Glucose, Bld 118 (H) 70 - 99 mg/dL    Comment: Glucose reference range applies only to samples taken after fasting for at least 8 hours.   BUN 21 8 - 23 mg/dL   Creatinine, Ser 9.22 0.44 - 1.00 mg/dL   Calcium 9.3 8.9 - 89.6 mg/dL   Phosphorus 3.1 2.5 - 4.6 mg/dL   Albumin 3.0 (L) 3.5 - 5.0 g/dL   GFR, Estimated >39 >39 mL/min    Comment: (NOTE) Calculated using the CKD-EPI Creatinine Equation (2021)    Anion gap 10 5 - 15    Comment: Performed at [facility], [address] [place], [place] [address]  CBC     Status: Abnormal   Collection Time: 12/07/23  4:23 AM  Result Value Ref Range   WBC 9.8 4.0 - 10.5 K/uL   RBC 4.41 3.87 - 5.11 MIL/uL   Hemoglobin 12.2 12.0 - 15.0 g/dL   HCT 64.0 (L) 63.9 - 53.9 %   MCV 81.4 80.0 - 100.0 fL   MCH 27.7 26.0 - 34.0 pg   MCHC 34.0 30.0 - 36.0 g/dL   RDW 85.1 88.4 - 84.4 %   Platelets 428 (H) 150 - 400 K/uL   nRBC 0.0 0.0 - 0.2 %    Comment: Performed at [facility], [address] [place], [place] [address]  Glucose, capillary     Status: Abnormal   Collection Time: 12/07/23  7:54 AM  Result Value Ref Range   Glucose-Capillary 188 (H) 70 - 99 mg/dL    Comment: Glucose reference range applies only to samples taken after fasting for at least 8 hours.  Glucose, capillary     Status: Abnormal   Collection Time: 12/07/23 11:50 AM  Result Value Ref Range   Glucose-Capillary 114 (H) 70 - 99 mg/dL    Comment: Glucose reference range applies  only to samples taken after fasting for at least 8 hours.  Glucose, capillary     Status: Abnormal   Collection Time: 12/07/23  3:06 PM  Result Value Ref Range   Glucose-Capillary 135 (H) 70 - 99 mg/dL  Comment: Glucose reference range applies only to samples taken after fasting for at least 8 hours.  Glucose, capillary     Status: Abnormal   Collection Time: 12/07/23  8:21 PM  Result Value Ref Range   Glucose-Capillary 113 (H) 70 - 99 mg/dL    Comment: Glucose reference range applies only to samples taken after fasting for at least 8 hours.   Comment 1 Notify RN   Glucose, capillary     Status: Abnormal   Collection Time: 12/07/23 11:45 PM  Result Value Ref Range   Glucose-Capillary 106 (H) 70 - 99 mg/dL    Comment: Glucose reference range applies only to samples taken after fasting for at least 8 hours.   Comment 1 Notify RN   Renal function panel     Status: Abnormal   Collection Time: 12/08/23  4:18 AM  Result Value Ref Range   Sodium 133 (L) 135 - 145 mmol/L   Potassium 3.5 3.5 - 5.1 mmol/L   Chloride 103 98 - 111 mmol/L   CO2 22 22 - 32 mmol/L   Glucose, Bld 104 (H) 70 - 99 mg/dL    Comment: Glucose reference range applies only to samples taken after fasting for at least 8 hours.   BUN 17 8 - 23 mg/dL   Creatinine, Ser 9.28 0.44 - 1.00 mg/dL   Calcium 9.1 8.9 - 89.6 mg/dL   Phosphorus 2.4 (L) 2.5 - 4.6 mg/dL   Albumin 3.0 (L) 3.5 - 5.0 g/dL   GFR, Estimated >39 >39 mL/min    Comment: (NOTE) Calculated using the CKD-EPI Creatinine Equation (2021)    Anion gap 8 5 - 15    Comment: Performed at [facility], [address] [place], [place] [address]  CBC     Status: None   Collection Time: 12/08/23  4:18 AM  Result Value Ref Range   WBC 6.6 4.0 - 10.5 K/uL   RBC 4.41 3.87 - 5.11 MIL/uL   Hemoglobin 12.2 12.0 - 15.0 g/dL   HCT 63.9 63.9 - 53.9 %   MCV 81.6 80.0 - 100.0 fL   MCH 27.7 26.0 - 34.0 pg   MCHC 33.9 30.0 - 36.0 g/dL   RDW 85.3 88.4 - 84.4 %    Platelets 383 150 - 400 K/uL   nRBC 0.0 0.0 - 0.2 %    Comment: Performed at [facility], [address] [place], [place] [address]  Glucose, capillary     Status: Abnormal   Collection Time: 12/08/23  4:36 AM  Result Value Ref Range   Glucose-Capillary 111 (H) 70 - 99 mg/dL    Comment: Glucose reference range applies only to samples taken after fasting for at least 8 hours.   Comment 1 Notify RN   Glucose, capillary     Status: Abnormal   Collection Time: 12/08/23  7:44 AM  Result Value Ref Range   Glucose-Capillary 126 (H) 70 - 99 mg/dL    Comment: Glucose reference range applies only to samples taken after fasting for at least 8 hours.   No results found.    Blood pressure (!) 107/56, pulse 73, temperature 98.6 F (37 C), temperature source Oral, resp. rate 18, height 5' 7 (1.702 m), weight 86.3 kg, SpO2 100%.  Medical Problem List and Plan: 1. Functional deficits secondary to nontraumatic subarachnoid hemorrhage with hydrocephalus status post EVD removed 7/27  -patient may *** shower  -ELOS/Goals: *** 2.  Antithrombotics: -DVT/anticoagulation:  Pharmaceutical: Lovenox   -  antiplatelet therapy: N/A 3. Pain Management: Topamax  25 mg twice daily, Fioricet  as needed, oxycodone  5 mg every 4 hours as needed pain 4. Mood/Behavior/Sleep: Provide emotional support  -antipsychotic agents: N/A 5. Neuropsych/cognition: This patient is not capable of making decisions on her own behalf. 6. Skin/Wound Care: Routine skin checks 7. Fluids/Electrolytes/Nutrition: Routine in and outs with follow-up chemistries 8.  Seizure prophylaxis.  Keppra  500 mg twice daily.  EEG negative 9.  Fevers/E. coli UTI/urinary retention.  7-day course Ancef  completed.  Blood cultures/CSF no growth to date.  Continue Flomax  0.4 mg daily, Urecholine  10 mg 3 times daily.  Check PVR 10.  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  Sodium 131-133.  Serum osmolality 289 urine osmolality 740 urine sodium 80.  Continue salt  tablets 2 g 3 times daily 11.  Hypertension.  Nimodipine  60 mg every 4 hours. 11.  Tobacco abuse.  NicoDerm patch.  Provide counseling 12.  Class I obesity.  BMI 29.80.  Dietary follow-up   Toribio JINNY Pitch, PA-C 12/08/2023

## 2023-12-08 NOTE — Progress Notes (Signed)
 PHARMACY - PHYSICIAN COMMUNICATION CRITICAL VALUE ALERT - BLOOD CULTURE IDENTIFICATION (BCID)  Andrea Bridges is an 63 y.o. female who presented to [facility] on 11/20/2023 with a chief complaint of SAH/seizures  Assessment:  1/2 blood cultures growing Staph species, l ikeyl contaminant  Name of physician (or Provider) Contacted:  Dr. Franky  Current antibiotics:  None   Changes to prescribed antibiotics recommended:  No changes needed  Results for orders placed or performed during the hospital encounter of 11/20/23  Blood Culture ID Panel (Reflexed) (Collected: 12/06/2023 10:54 AM)  Result Value Ref Range   Enterococcus faecalis NOT DETECTED NOT DETECTED   Enterococcus Faecium NOT DETECTED NOT DETECTED   Listeria monocytogenes NOT DETECTED NOT DETECTED   Staphylococcus species DETECTED (A) NOT DETECTED   Staphylococcus aureus (BCID) NOT DETECTED NOT DETECTED   Staphylococcus epidermidis NOT DETECTED NOT DETECTED   Staphylococcus lugdunensis NOT DETECTED NOT DETECTED   Streptococcus species NOT DETECTED NOT DETECTED   Streptococcus agalactiae NOT DETECTED NOT DETECTED   Streptococcus pneumoniae NOT DETECTED NOT DETECTED   Streptococcus pyogenes NOT DETECTED NOT DETECTED   A.calcoaceticus-baumannii NOT DETECTED NOT DETECTED   Bacteroides fragilis NOT DETECTED NOT DETECTED   Enterobacterales NOT DETECTED NOT DETECTED   Enterobacter cloacae complex NOT DETECTED NOT DETECTED   Escherichia coli NOT DETECTED NOT DETECTED   Klebsiella aerogenes NOT DETECTED NOT DETECTED   Klebsiella oxytoca NOT DETECTED NOT DETECTED   Klebsiella pneumoniae NOT DETECTED NOT DETECTED   Proteus species NOT DETECTED NOT DETECTED   Salmonella species NOT DETECTED NOT DETECTED   Serratia marcescens NOT DETECTED NOT DETECTED   Haemophilus influenzae NOT DETECTED NOT DETECTED   Neisseria meningitidis NOT DETECTED NOT DETECTED   Pseudomonas aeruginosa NOT DETECTED NOT DETECTED   Stenotrophomonas  maltophilia NOT DETECTED NOT DETECTED   Candida albicans NOT DETECTED NOT DETECTED   Candida auris NOT DETECTED NOT DETECTED   Candida glabrata NOT DETECTED NOT DETECTED   Candida krusei NOT DETECTED NOT DETECTED   Candida parapsilosis NOT DETECTED NOT DETECTED   Candida tropicalis NOT DETECTED NOT DETECTED   Cryptococcus neoformans/gattii NOT DETECTED NOT DETECTED    Andrea Bridges 12/08/2023  2:01 AM

## 2023-12-08 NOTE — Progress Notes (Signed)
 Inpatient Rehab Admissions Coordinator:   Note spiked a temp again today.  Dr. Arlon confirmed not ready to transfer to CIR yet.  Still awaiting insurance determination.  I will continue to follow.   Reche Lowers, PT, DPT Admissions Coordinator [phone] 12/08/23  12:46 PM

## 2023-12-08 NOTE — Progress Notes (Signed)
  NEUROSURGERY PROGRESS NOTE   No issues overnight. Working with OT today, seems somewhat more fatigued.  EXAM:  BP 135/71 (BP Location: Left Arm)   Pulse 73   Temp (!) 101.7 F (38.7 C) (Oral)   Resp 19   Ht 5' 7 (1.702 m)   Wt 86.3 kg   LMP  (LMP Unknown)   SpO2 99%   BMI 29.80 kg/m   Awake, alert, oriented Answers questions appropriately Pupils reactive MAE symmetrically  IMPRESSION:  62 y.o. 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   PLAN: - Cont supportive care per TRH - Cont Keppra  500mg  BID - Cont PT/OT - Dispo planning   Gerldine Maizes, MD [facility]

## 2023-12-08 NOTE — Progress Notes (Addendum)
 Progress Note   Patient: Andrea Bridges FMW:[id number] DOB: 23-Nov-1960 DOA: 11/20/2023  DOS: the patient was seen and examined on 12/08/2023   Brief hospital course:  63 y.o. female with PMH of  PCOS, tobacco use, anxiety who presented with headache and altered mental status.  CT head showed diffuse basilar subarachnoid hemorrhage with intraventricular extension and early ventriculomegaly.  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  Patient had right frontal ventricular catheter placed by Dr. Gillie on 11/20/2023.  7/15 had limited angiogram completed that did not show aneurysms, AVMs or fistulas.  Patient required replacement of the right frontal ventricular catheter due to prior catheter becoming nonfunctional.  Ventricular catheter was removed on 7/26.  Patient noted to have waxing and waning confusion.  Patient was treated for UTI.  She also had urinary retention requiring Foley catheter.  Core track was discontinued, following with SLP.  Patient seen by PT and OT and found to have functional deficits.  Yesterday patient was able to ambulate with min assist, sit to stand mod assist.     This AM, patient had acute altered mental status and shaking episodes.  Nonresponsive not following commands.  Had stat CT scan which was negative and EEG done which showed mild to moderate diffuse encephalopathy and no seizures.  Keppra  initiated   Assessment and Plan:  Addendum-patient showed fever of 101.7.  Will order UA, chest x-ray.  May be underlying etiology of patient's urinary retention.  Will order CBC, BMP.  Etiology could be central but want to rule out infectious source.   Nontraumatic subarachnoid hemorrhage with hydrocephalus - S/p EVD 7/13 with replacement 7/21.  Followed closely by neurosurgery.  Keppra  on board.  Continue Nimotop .  Serial  neuroexams.   Acute encephalopathy with fevers - Completed ceftriaxone x 7 days.   Acute urinary retention - Failed voiding trial 7/29 and 7/30 requiring In-N-Out catheter.  Subsequently ordered Foley catheter.  Flomax  and bethanechol  on board.  Will increase Flomax  to 0.8 mg daily.  Will consider repeating voiding trial in 1 to 2 days.  If fails again, may warrant urology consult.   Dysphagia with poor p.o. intake - Appetite increasing.  DC core track 7/28.  SLP following closely.   Hyponatremia - Avoid hypotonic fluids.  Restrict free water.  Salt tabs 3 times daily.   Goals of care - Currently being evaluated by CIR/inpatient rehab.   Subjective: Patient resting comfortably this morning.  Foley catheter in place as patient was unable to void on her own.  Denies any fever, shortness of breath, chest pain, nausea, vomiting, abdominal pain.  Physical Exam:  Vitals:   12/08/23 0500 12/08/23 0743 12/08/23 1055 12/08/23 1145  BP:  129/69 (!) 107/56 135/71  Pulse:  74 73 73  Resp:  18  19  Temp:  98.6 F (37 C)  (!) 101.7 F (38.7 C)  TempSrc:  Oral  Oral  SpO2:  100%  99%  Weight: 86.3 kg     Height:        GENERAL:  Alert, pleasant, no acute distress  HEENT:  EOMI CARDIOVASCULAR:  RRR, no murmurs appreciated RESPIRATORY:  Clear to auscultation, no wheezing, rales, or rhonchi GASTROINTESTINAL:  Soft, nontender, nondistended EXTREMITIES:  No LE edema bilaterally NEURO:  No new focal deficits appreciated SKIN:  No rashes noted PSYCH:  Appropriate mood and affect  Data Reviewed:  Imaging Studies: EEG adult Result Date: 12/06/2023 Gregg Lek, MD     12/06/2023 11:22 AM [name] MRN: [id number] Epilepsy Attending: Lek Gregg Referring Physician/Provider: Date: 12/06/2023 Duration: 25 minutes  Patient history: 63 year old woman with  SAH, now with seizure like activity, EEG to rule out seizures Level of alertness: Awake, drowsy, sleep AEDs during EEG  study: Technical aspects: This EEG study was done with scalp electrodes positioned according to the 10-20 International system of electrode placement. Electrical activity was reviewed with band pass filter of 1-70Hz , sensitivity of 7 uV/mm, display speed of 28mm/sec with a 60Hz  notched filter applied as appropriate. EEG data were recorded continuously and digitally stored.  Video monitoring was available and reviewed as appropriate. Description: The posterior dominant rhythm consists of 3-7 Hz activity of moderate voltage (25-35 uV) seen predominantly in posterior head regions, symmetric and reactive to eye opening and eye closing. Drowsiness was characterized by attenuation of the posterior background rhythm. Sleep was not seen. EEG showed continuous generalized polymorphic sharply contoured 3 to 6 Hz theta-delta slowing. Physiologic photic driving was not seen during photic stimulation.  Hyperventilation was not performed.   ABNORMALITY - Continuous slow, generalized IMPRESSION: This study is suggestive of mild to moderate diffuse encephalopathy, nonspecific etiology but likely related to sedation, toxic-metabolic etiology. No seizures or epileptiform discharges were seen throughout the recording. Lek Gregg MD Neurology    CT HEAD WO CONTRAST ( ) Result Date: 12/06/2023 CLINICAL DATA:  Headache, sudden, severe EXAM: CT HEAD WITHOUT CONTRAST TECHNIQUE: Contiguous axial images were obtained from the base of the skull through the vertex without intravenous contrast. RADIATION DOSE REDUCTION: This exam was performed according to the departmental dose-optimization program which includes automated exposure control, adjustment of the mA and/or kV according to patient size and/or use of iterative reconstruction technique. COMPARISON:  CT the head dated December 03, 2023. FINDINGS: Brain: Since the previous study, a right frontal approach ventriculostomy catheter has been removed. There is some residual hemorrhage  along the tract. There has been interval near complete resolution of intraventricular hemorrhage. There has also been interval near complete resolution of subarachnoid hemorrhage. The ventricles and sulci are proportionate in size. There are dystrophic calcifications present anterior laterally within the left cerebellar hemisphere on image 8 of series 3. Vascular: Mild vascular calcifications. Skull: Right frontal burr craniotomy in defect. Otherwise, unremarkable. Sinuses/Orbits: Mild mucosal disease within the right maxillary sinus. The mastoid air cells are clear. Other: None. IMPRESSION: 1. There is a cluster of dystrophic calcifications present anterior laterally within the left cerebellar hemisphere. Recommend correlation with an MRI of the brain without and with gadolinium contrast to exclude neoplasm. 2. Interval near total resolution of intraventricular hemorrhage and subarachnoid hemorrhage following removal of a right sided ventriculostomy catheter. There is mild residual hemorrhage along the tract. Electronically Signed   By: Evalene Coho M.D.   On: 12/06/2023 09:54   VAS US  TRANSCRANIAL DOPPLER Result Date: 12/03/2023  Transcranial Doppler Patient Name:  Andrea Bridges  Date of Exam:   12/02/2023 Medical Rec #: [id number]   Accession #:    [id number] Date of Birth: 11-15-1960   Patient Gender: F Patient Age:   85 years Exam Location:  [facility] Procedure:      VAS US  TRANSCRANIAL DOPPLER Referring Phys: TORIBIO SHARPS --------------------------------------------------------------------------------  Indications: Subarachnoid hemorrhage. History: Large subarachnoid hemorrhage. Limitations: Movement Comparison Study: Prior TCD done 11/30/2023 Performing Technologist: Alberta Lis RVS  Examination Guidelines: A complete evaluation  includes B-mode imaging, spectral Doppler, color Doppler, and power Doppler as needed of all accessible portions of each vessel. Bilateral testing is considered an  integral part of a complete examination. Limited examinations for reoccurring indications may be performed as noted.  +----------+---------------+----------+-----------+-------+ RIGHT TCD Right VM (cm/s)Depth (cm)PulsatilityComment +----------+---------------+----------+-----------+-------+ MCA             116                   1.46            +----------+---------------+----------+-----------+-------+ ACA            -112                   1.02            +----------+---------------+----------+-----------+-------+ Term ICA        65                    1.33            +----------+---------------+----------+-----------+-------+ PCA P1          -34                   1.67            +----------+---------------+----------+-----------+-------+ Opthalmic       27                    1.83            +----------+---------------+----------+-----------+-------+ ICA siphon      58                    1.15            +----------+---------------+----------+-----------+-------+ Vertebral                                             +----------+---------------+----------+-----------+-------+  +----------+--------------+----------+-----------+-------+ LEFT TCD  Left VM (cm/s)Depth (cm)PulsatilityComment +----------+--------------+----------+-----------+-------+ MCA            120                   1.40            +----------+--------------+----------+-----------+-------+ ACA            -22                   1.43            +----------+--------------+----------+-----------+-------+ Term ICA        41                   1.71            +----------+--------------+----------+-----------+-------+ PCA P1          58                   1.34            +----------+--------------+----------+-----------+-------+ Opthalmic       20                   1.81            +----------+--------------+----------+-----------+-------+ ICA siphon      38                    1.19            +----------+--------------+----------+-----------+-------+  Vertebral                                            +----------+--------------+----------+-----------+-------+ Distal ICA     -47                   1.71            +----------+--------------+----------+-----------+-------+  +------------+-------+-------+             VM cm/sComment +------------+-------+-------+ Prox Basilar  -44          +------------+-------+-------+ +---------------------+----+ Left Lindegaard Ratio2.55 +---------------------+----+  Summary:  Elevated bilateral middle crebral and right anterior cerebral artery mean flow velocities suggest mioderate stenosis.Globally elevated pulsatility indices suggest diffuse increase in intracranial pressure or generalized atherosclerosis *See table(s) above for TCD measurements and observations.  Diagnosing physician: Eather Popp MD Electronically signed by Eather Popp MD on 12/03/2023 at 11:13:07 AM.    Final    CT HEAD WO CONTRAST ( ) Result Date: 12/03/2023 EXAM: CT HEAD WITHOUT CONTRAST 12/03/2023 05:53:00 AM TECHNIQUE: CT of the head was performed without the administration of intravenous contrast. Automated exposure control, iterative reconstruction, and/or weight based adjustment of the mA/kV was utilized to reduce the radiation dose to as low as reasonably achievable. COMPARISON: CT head without contrast 11/28/2023 CLINICAL HISTORY: Hydrocephalus. Chief complaints; Headache. FINDINGS: BRAIN AND VENTRICLES: Blood products along the right frontal ventriculostomy are stable. Mild subcortical edema in the high right frontal lobe adjacent to the ventriculostomy catheter is also stable. Subarachnoid blood over the convexities bilaterally, left greater than right, is improving. Minimal intraventricular hemorrhage bilaterally is stable to slightly decreased. No hydrocephalus is present. ORBITS: No acute abnormality. SINUSES: No acute abnormality. SOFT  TISSUES AND SKULL: No acute soft tissue abnormality. No skull fracture. IMPRESSION: 1. Improving subarachnoid blood over the convexities bilaterally, left greater than right. 2. Stable to slightly decreased minimal intraventricular hemorrhage bilaterally. 3. No hydrocephalus. 4. Stable blood products along the right frontal ventriculostomy and mild subcortical edema in the high right frontal lobe adjacent to the ventriculostomy catheter. 5. No new hemorrhage or focal infarct. Electronically signed by: Lonni Necessary MD 12/03/2023 06:16 AM EDT RP Workstation: [id number]   VAS US  TRANSCRANIAL DOPPLER Result Date: 12/01/2023  Transcranial Doppler Patient Name:  Andrea Bridges  Date of Exam:   11/30/2023 Medical Rec #: [id number]   Accession #:    [id number] Date of Birth: 1960-07-01   Patient Gender: F Patient Age:   68 years Exam Location:  [facility] Procedure:      VAS US  TRANSCRANIAL DOPPLER Referring Phys: TORIBIO SHARPS --------------------------------------------------------------------------------  Indications: Subarachnoid hemorrhage. History: Right frontal ventricular catheter placement (11/20/2023). Replacement of right frontal ventricular catheter (11/28/2023).  Performing Technologist: Ricka Sturdivant-Jones RDMS, RVT  Examination Guidelines: A complete evaluation includes B-mode imaging, spectral Doppler, color Doppler, and power Doppler as needed of all accessible portions of each vessel. Bilateral testing is considered an integral part of a complete examination. Limited examinations for reoccurring indications may be performed as noted.  +----------+---------------+----------+-----------+-------+ RIGHT TCD Right VM (cm/s)Depth (cm)PulsatilityComment +----------+---------------+----------+-----------+-------+ MCA             149         4.50      1.11            +----------+---------------+----------+-----------+-------+ ACA             -94  1.07             +----------+---------------+----------+-----------+-------+ PCA P1          52                    1.18            +----------+---------------+----------+-----------+-------+ Opthalmic       28                    1.85            +----------+---------------+----------+-----------+-------+ ICA siphon      66                    1.22            +----------+---------------+----------+-----------+-------+ Vertebral       -56                   1.19            +----------+---------------+----------+-----------+-------+ Distal ICA      42                    1.35            +----------+---------------+----------+-----------+-------+  +----------+--------------+----------+-----------+-------+ LEFT TCD  Left VM (cm/s)Depth (cm)PulsatilityComment +----------+--------------+----------+-----------+-------+ MCA            118         4.80      1.27            +----------+--------------+----------+-----------+-------+ ACA            -77                   1.13            +----------+--------------+----------+-----------+-------+ Term ICA        42                   1.10            +----------+--------------+----------+-----------+-------+ PCA P1          64                   1.32            +----------+--------------+----------+-----------+-------+ Opthalmic       20                   1.23            +----------+--------------+----------+-----------+-------+ ICA siphon      61                   1.17            +----------+--------------+----------+-----------+-------+ Vertebral      -94                   1.29            +----------+--------------+----------+-----------+-------+ Distal ICA      33                   1.28            +----------+--------------+----------+-----------+-------+  +------------+-------+-------+             VM cm/sComment +------------+-------+-------+ Prox Basilar  -38          +------------+-------+-------+ Dist  Basilar  -46          +------------+-------+-------+ +----------------------+---+ Right Lindegaard Ratio3.5 +----------------------+---+ +---------------------+---+  Left Lindegaard Ratio3.5 +---------------------+---+  Summary:  Eleveated mean flow velocities suggestive of moderate right middle cerebral and mild right anterior cerebral and left middle cerebral artery vasospasm.Globally elevate dpulsatility indices suggest increase intracranial pressure likely *See table(s) above for TCD measurements and observations.  Diagnosing physician: Eather Popp MD Electronically signed by Eather Popp MD on 12/01/2023 at 8:19:04 AM.    Final    IR US  Guide Vasc Access Right PROCEDURE: Diagnostic Cerebral Angiogram  SURGEON:  Dr. Gerldine Maizes, MD  HISTORY:  The patient is a 63 y.o. yo female admitted to the hospital with sudden onset of headache and initial CT scan demonstrating diffuse subarachnoid hemorrhage.  Her initial CT angiogram was negative.  We attempted diagnostic cerebral angiogram upon admission which was aborted as the patient was somewhat agitated and removed her femoral sheath mid procedure.  She has been monitored in the intensive care unit with largely stable neurologic condition.  She presents today for follow-up diagnostic cerebral angiogram.  APPROACH:  The technical aspects of the procedure as well as its potential risks and benefits were reviewed with the patient and her family. These risks included but were not limited bleeding, infection, allergic reaction, damage to organs/vital structures, stroke, non-diagnostic procedure, and the catastrophic outcomes of heart attack, coma, and death. With an understanding of these risks, informed consent was obtained and witnessed.   The patient was placed in the supine position on the angiography table and the skin of right groin prepped in the usual sterile fashion. The procedure was performed under local anesthesia (1%-solution of  bicarbonate-bufferred Lidoacaine) and conscious sedation with Versed  and fentanyl  monitored by the in-suite nurse and myself, including non-invasive blood pressure and continuous pulse oxymetry.   Access to the right common femoral artery was obtained under ultrasound guidance using a micropuncture needle.  This allowed direct visualization of the micropuncture needle into the lumen of the right common femoral artery.  A short 5 French sheath was placed using standard Seldinger technique.  HEPARIN : 0 Units total.   CONTRAST AGENT: See IR records  FLUOROSCOPY TIME: See IR records   CATHETER(S) AND WIRE(S):   5-French JB-1 glidecatheter  0.035 glidewire   VESSELS CATHETERIZED:  Right internal carotid  Left internal carotid  Right vertebral  Left vertebral  Right common femoral  VESSELS STUDIED:  Right internal carotid, head Left internal carotid, head Left vertebral Right vertebral Right femoral  PROCEDURAL NARRATIVE:  A 5-Fr JB-1 terumo glide catheter was advanced over a 0.035 glidewire into the aortic arch. The above vessels were then sequentially catheterized and cervical/cerebral angiograms taken. After review of images, the catheter was removed without incident.   INTERPRETATION:  Right internal carotid, head:  Injection reveals the presence of a widely patent ICA, M1, and A1 segments and their branches. No aneurysms, arteriovenous malformations, or high flow fistulas are visualized.  There is mild to moderate vasospasm involving primarily the proximal segments of the middle and anterior cerebral arteries.  There is no flow mentation.  The parenchymal and venous phases are unremarkable. The venous sinuses are widely patent.   Left internal carotid, head:  Injection reveals the presence of a widely patent ICA, A1, and M1 segments and their branches. No aneurysms, arteriovenous malformations, or high flow fistulas are visualized.  Similar to the contralateral side, there is mild to moderate vasospasm involving the  proximal segments of the middle cerebral artery.  There is no flow limitation.  The parenchymal and venous phases are unremarkable. The venous sinuses are  widely patent.   Right vertebral:  Injection reveals the presence of a widely patent vertebral artery. This leads to a widely patent basilar artery that terminates in left P1, while the right P1 is largely hypoplastic. The basilar apex is normal. No aneurysms, arteriovenous malformations, or high flow fistulas are visualized.  No significant vasospasm of the posterior circulation is noted.  The parenchymal and venous phases are normal. The venous sinuses are patent.   Right vertebral:   Normal vessel. No PICA aneurysm. See basilar description above.   Right femoral:   Normal vessel. No significant atherosclerotic disease. Arterial sheath in adequate position.  DISPOSITION: Upon completion of the study, the femoral sheath was removed and hemostasis obtained using a 5-Fr Exoseal closure device. Good proximal and distal lower extremity pulses were documented upon achievement of hemostasis. The procedure was well tolerated and no early complications were observed.  Patient was then transferred back to the neurointensive care unit for further care.  IMPRESSION: 1.  No intracranial aneurysms, arteriovenous malformations, or high flow fistulas are identified on this follow-up diagnostic cerebral angiogram as a source of subarachnoid hemorrhage. 2.  There is mild to moderate vasospasm involving the proximal segments of the anterior and middle cerebral arteries bilaterally, without any flow limitation.    Gerldine Maizes, MD [facility]  Electronically signed by Maizes Gerldine, MD at 11/29/2023 11:44 AM   IR ANGIO VERTEBRAL SEL VERTEBRAL BILAT MOD SED PROCEDURE: Diagnostic Cerebral Angiogram  SURGEON:  Dr. Gerldine Maizes, MD  HISTORY:  The patient is a 63 y.o. yo female admitted to the hospital with sudden onset of headache and  initial CT scan demonstrating diffuse subarachnoid hemorrhage.  Her initial CT angiogram was negative.  We attempted diagnostic cerebral angiogram upon admission which was aborted as the patient was somewhat agitated and removed her femoral sheath mid procedure.  She has been monitored in the intensive care unit with largely stable neurologic condition.  She presents today for follow-up diagnostic cerebral angiogram.  APPROACH:  The technical aspects of the procedure as well as its potential risks and benefits were reviewed with the patient and her family. These risks included but were not limited bleeding, infection, allergic reaction, damage to organs/vital structures, stroke, non-diagnostic procedure, and the catastrophic outcomes of heart attack, coma, and death. With an understanding of these risks, informed consent was obtained and witnessed.   The patient was placed in the supine position on the angiography table and the skin of right groin prepped in the usual sterile fashion. The procedure was performed under local anesthesia (1%-solution of bicarbonate-bufferred Lidoacaine) and conscious sedation with Versed  and fentanyl  monitored by the in-suite nurse and myself, including non-invasive blood pressure and continuous pulse oxymetry.   Access to the right common femoral artery was obtained under ultrasound guidance using a micropuncture needle.  This allowed direct visualization of the micropuncture needle into the lumen of the right common femoral artery.  A short 5 French sheath was placed using standard Seldinger technique.  HEPARIN : 0 Units total.   CONTRAST AGENT: See IR records  FLUOROSCOPY TIME: See IR records   CATHETER(S) AND WIRE(S):   5-French JB-1 glidecatheter  0.035 glidewire   VESSELS CATHETERIZED:  Right internal carotid  Left internal carotid  Right vertebral  Left vertebral  Right common femoral  VESSELS STUDIED:  Right internal carotid, head Left internal carotid, head Left vertebral  Right vertebral Right femoral  PROCEDURAL NARRATIVE:  A 5-Fr JB-1 terumo glide catheter was advanced  over a 0.035 glidewire into the aortic arch. The above vessels were then sequentially catheterized and cervical/cerebral angiograms taken. After review of images, the catheter was removed without incident.   INTERPRETATION:  Right internal carotid, head:  Injection reveals the presence of a widely patent ICA, M1, and A1 segments and their branches. No aneurysms, arteriovenous malformations, or high flow fistulas are visualized.  There is mild to moderate vasospasm involving primarily the proximal segments of the middle and anterior cerebral arteries.  There is no flow mentation.  The parenchymal and venous phases are unremarkable. The venous sinuses are widely patent.   Left internal carotid, head:  Injection reveals the presence of a widely patent ICA, A1, and M1 segments and their branches. No aneurysms, arteriovenous malformations, or high flow fistulas are visualized.  Similar to the contralateral side, there is mild to moderate vasospasm involving the proximal segments of the middle cerebral artery.  There is no flow limitation.  The parenchymal and venous phases are unremarkable. The venous sinuses are widely patent.   Right vertebral:  Injection reveals the presence of a widely patent vertebral artery. This leads to a widely patent basilar artery that terminates in left P1, while the right P1 is largely hypoplastic. The basilar apex is normal. No aneurysms, arteriovenous malformations, or high flow fistulas are visualized.  No significant vasospasm of the posterior circulation is noted.  The parenchymal and venous phases are normal. The venous sinuses are patent.   Right vertebral:   Normal vessel. No PICA aneurysm. See basilar description above.   Right femoral:   Normal vessel. No significant atherosclerotic disease. Arterial sheath in adequate position.  DISPOSITION: Upon completion of the study, the  femoral sheath was removed and hemostasis obtained using a 5-Fr Exoseal closure device. Good proximal and distal lower extremity pulses were documented upon achievement of hemostasis. The procedure was well tolerated and no early complications were observed.  Patient was then transferred back to the neurointensive care unit for further care.  IMPRESSION: 1.  No intracranial aneurysms, arteriovenous malformations, or high flow fistulas are identified on this follow-up diagnostic cerebral angiogram as a source of subarachnoid hemorrhage. 2.  There is mild to moderate vasospasm involving the proximal segments of the anterior and middle cerebral arteries bilaterally, without any flow limitation.    Gerldine Maizes, MD [facility]  Electronically signed by Maizes Gerldine, MD at 11/29/2023 11:44 AM   IR ANGIO INTRA EXTRACRAN SEL INTERNAL CAROTID BILAT MOD SED PROCEDURE: Diagnostic Cerebral Angiogram  SURGEON:  Dr. Gerldine Maizes, MD  HISTORY:  The patient is a 63 y.o. yo female admitted to the hospital with sudden onset of headache and initial CT scan demonstrating diffuse subarachnoid hemorrhage.  Her initial CT angiogram was negative.  We attempted diagnostic cerebral angiogram upon admission which was aborted as the patient was somewhat agitated and removed her femoral sheath mid procedure.  She has been monitored in the intensive care unit with largely stable neurologic condition.  She presents today for follow-up diagnostic cerebral angiogram.  APPROACH:  The technical aspects of the procedure as well as its potential risks and benefits were reviewed with the patient and her family. These risks included but were not limited bleeding, infection, allergic reaction, damage to organs/vital structures, stroke, non-diagnostic procedure, and the catastrophic outcomes of heart attack, coma, and death. With an understanding of these risks, informed consent was obtained and witnessed.    The patient was placed in the supine position on the angiography  table and the skin of right groin prepped in the usual sterile fashion. The procedure was performed under local anesthesia (1%-solution of bicarbonate-bufferred Lidoacaine) and conscious sedation with Versed  and fentanyl  monitored by the in-suite nurse and myself, including non-invasive blood pressure and continuous pulse oxymetry.   Access to the right common femoral artery was obtained under ultrasound guidance using a micropuncture needle.  This allowed direct visualization of the micropuncture needle into the lumen of the right common femoral artery.  A short 5 French sheath was placed using standard Seldinger technique.  HEPARIN : 0 Units total.   CONTRAST AGENT: See IR records  FLUOROSCOPY TIME: See IR records   CATHETER(S) AND WIRE(S):   5-French JB-1 glidecatheter  0.035 glidewire   VESSELS CATHETERIZED:  Right internal carotid  Left internal carotid  Right vertebral  Left vertebral  Right common femoral  VESSELS STUDIED:  Right internal carotid, head Left internal carotid, head Left vertebral Right vertebral Right femoral  PROCEDURAL NARRATIVE:  A 5-Fr JB-1 terumo glide catheter was advanced over a 0.035 glidewire into the aortic arch. The above vessels were then sequentially catheterized and cervical/cerebral angiograms taken. After review of images, the catheter was removed without incident.   INTERPRETATION:  Right internal carotid, head:  Injection reveals the presence of a widely patent ICA, M1, and A1 segments and their branches. No aneurysms, arteriovenous malformations, or high flow fistulas are visualized.  There is mild to moderate vasospasm involving primarily the proximal segments of the middle and anterior cerebral arteries.  There is no flow mentation.  The parenchymal and venous phases are unremarkable. The venous sinuses are widely patent.   Left internal carotid, head:  Injection reveals the presence of a widely patent ICA,  A1, and M1 segments and their branches. No aneurysms, arteriovenous malformations, or high flow fistulas are visualized.  Similar to the contralateral side, there is mild to moderate vasospasm involving the proximal segments of the middle cerebral artery.  There is no flow limitation.  The parenchymal and venous phases are unremarkable. The venous sinuses are widely patent.   Right vertebral:  Injection reveals the presence of a widely patent vertebral artery. This leads to a widely patent basilar artery that terminates in left P1, while the right P1 is largely hypoplastic. The basilar apex is normal. No aneurysms, arteriovenous malformations, or high flow fistulas are visualized.  No significant vasospasm of the posterior circulation is noted.  The parenchymal and venous phases are normal. The venous sinuses are patent.   Right vertebral:   Normal vessel. No PICA aneurysm. See basilar description above.   Right femoral:   Normal vessel. No significant atherosclerotic disease. Arterial sheath in adequate position.  DISPOSITION: Upon completion of the study, the femoral sheath was removed and hemostasis obtained using a 5-Fr Exoseal closure device. Good proximal and distal lower extremity pulses were documented upon achievement of hemostasis. The procedure was well tolerated and no early complications were observed.  Patient was then transferred back to the neurointensive care unit for further care.  IMPRESSION: 1.  No intracranial aneurysms, arteriovenous malformations, or high flow fistulas are identified on this follow-up diagnostic cerebral angiogram as a source of subarachnoid hemorrhage. 2.  There is mild to moderate vasospasm involving the proximal segments of the anterior and middle cerebral arteries bilaterally, without any flow limitation.    Gerldine Maizes, MD [facility]  Electronically signed by Maizes Gerldine, MD at 11/29/2023 11:44 AM   VAS US  TRANSCRANIAL  DOPPLER Result  Date: 11/28/2023  Transcranial Doppler Patient Name:  Andrea Bridges  Date of Exam:   11/28/2023 Medical Rec #: [id number]   Accession #:    [id number] Date of Birth: [birth year]   Patient Gender: F Patient Age:   22 years Exam Location:  [facility] Procedure:      VAS US  TRANSCRANIAL DOPPLER Referring Phys: TORIBIO SHARPS --------------------------------------------------------------------------------  Indications: Subarachnoid hemorrhage. History: Right frontal ventricular catheter placement (11/20/2023). Replacement of right frontal ventricular catheter (11/28/2023). Comparison Study: Previous exam was on 11/25/2023 Performing Technologist: Ezzie Potters RVT, RDMS  Examination Guidelines: A complete evaluation includes B-mode imaging, spectral Doppler, color Doppler, and power Doppler as needed of all accessible portions of each vessel. Bilateral testing is considered an integral part of a complete examination. Limited examinations for reoccurring indications may be performed as noted.  +----------+---------------+----------+-----------+-------+ RIGHT TCD Right VM (cm/s)Depth (cm)PulsatilityComment +----------+---------------+----------+-----------+-------+ MCA             132         5.30      1.23            +----------+---------------+----------+-----------+-------+ ACA             -31                   1.24            +----------+---------------+----------+-----------+-------+ Term ICA        36                    1.36            +----------+---------------+----------+-----------+-------+ PCA P1          39                    1.48            +----------+---------------+----------+-----------+-------+ Opthalmic       23                    2.11            +----------+---------------+----------+-----------+-------+ ICA siphon      54                    1.07            +----------+---------------+----------+-----------+-------+ Vertebral       -63         6.80       1.22            +----------+---------------+----------+-----------+-------+ Distal ICA      28                    0.85            +----------+---------------+----------+-----------+-------+  +----------+--------------+----------+-----------+-------+ LEFT TCD  Left VM (cm/s)Depth (cm)PulsatilityComment +----------+--------------+----------+-----------+-------+ MCA            137         5.20      1.32            +----------+--------------+----------+-----------+-------+ ACA            -20                   1.31            +----------+--------------+----------+-----------+-------+ Term ICA        52  1.15            +----------+--------------+----------+-----------+-------+ PCA P1          54                   1.08            +----------+--------------+----------+-----------+-------+ Opthalmic       17                   1.89            +----------+--------------+----------+-----------+-------+ ICA siphon      34                   1.06            +----------+--------------+----------+-----------+-------+ Vertebral      -81         7.00      1.26            +----------+--------------+----------+-----------+-------+ Distal ICA      26                   1.41            +----------+--------------+----------+-----------+-------+  +------------+-------+-------+             VM cm/sComment +------------+-------+-------+ Prox Basilar  -41   0.95   +------------+-------+-------+ Dist Basilar  -59   1.09   +------------+-------+-------+ +----------------------+----+ Right Lindegaard Ratio4.71 +----------------------+----+ +---------------------+----+ Left Lindegaard Ratio5.27 +---------------------+----+  Summary:  Elevated mean velocity and Lindegaard Ratio noted bilaterally, indicating moderate vasospasm of both middle cerebral arteries. *See table(s) above for TCD measurements and observations.  Diagnosing physician: Eather Popp MD Electronically signed by Eather Popp MD on 11/28/2023 at 5:24:54 PM.    Final    CT HEAD WO CONTRAST ( ) Result Date: 11/28/2023 EXAM: CT HEAD WITHOUT CONTRAST 11/28/2023 03:11:00 AM TECHNIQUE: CT of the head was performed without the administration of intravenous contrast. Automated exposure control, iterative reconstruction, and/or weight based adjustment of the mA/kV was utilized to reduce the radiation dose to as low as reasonably achievable. COMPARISON: 11/25/2023 CLINICAL HISTORY: Stroke, follow up. Headache. FINDINGS: BRAIN AND VENTRICLES: Unchanged small volume subarachnoid hemorrhage over both hemispheres. Unchanged small intraparenchymal hematoma along the proximal course of the shunt catheter, which terminates at the right foramen of Monro. Small amount of blood layering within the occipital horns of the lateral ventricles, unchanged. The size and configuration of the lateral ventricles is unchanged. ORBITS: No acute abnormality. SINUSES: Right maxillary sinus opacification. SOFT TISSUES AND SKULL: No acute soft tissue abnormality. No skull fracture. IMPRESSION: 1. Unchanged small volume subarachnoid hemorrhage over both hemispheres. 2. Unchanged small intraparenchymal hematoma along the proximal course of the shunt catheter, which terminates at the right foramen of [place]. 3. Small amount of blood layering within the occipital horns of the lateral ventricles, unchanged. 4. Unchanged size and configuration of the lateral ventricles. Electronically signed by: Franky Stanford MD 11/28/2023 03:21 AM EDT RP Workstation: [id number]   DG CHEST PORT 1 VIEW Result Date: 11/26/2023 CLINICAL DATA:  Fever.  Subarachnoid hemorrhage EXAM: PORTABLE CHEST 1 VIEW COMPARISON:  Chest x-ray 11/21/2023. FINDINGS: Enteric tube with tip extending beneath the diaphragm. Overlapping cardiac leads. No pneumothorax or edema. No effusion. Normal cardiopericardial silhouette. Mild bandlike changes along the lung bases.  Atelectasis is favored over infiltrate but recommend follow-up. IMPRESSION: Bandlike opacities along the lung bases. Favor atelectasis over infiltrate but recommend follow-up. Enteric tube. Electronically Signed   By: Ranell  Charlanne M.D.   On: 11/26/2023 18:56   EEG adult Result Date: 11/26/2023 Michaela Aisha SQUIBB, MD     11/26/2023  3:11 PM History: 63 yo F with lethargy in the setting of SAH, evaluate for evidence of seizure EEG Duration: 22 minutes Sedation: none Patient State: Awake and asleep Technique: This EEG was acquired with electrodes placed according to the International 10-20 electrode system (including Fp1, Fp2, F3, F4, C3, C4, P3, P4, O1, O2, T3, T4, T5, T6, A1, A2, Fz, Cz, Pz). The following electrodes were missing or displaced: none. Background:  There is a posterior dominant rhythm of 8 to 9 Hz which is seen at times.  There is also irregular delta and theta activity intruding into the background.  With drowsiness there is an increase in slow activity and the patient does have symmetric appearing sleep structure seen during the recording. Photic stimulation: Physiologic driving is not performed EEG Abnormalities: 1) generalized irregular slow activity Clinical Interpretation: This EEG is consistent with a mild generalized nonspecific cerebral dysfunction (encephalopathy). There was no seizure or seizure predisposition recorded on this study. Please note that lack of epileptiform activity on EEG does not preclude the possibility of epilepsy. Aisha Michaela, MD [facility] If 7pm- 7am, please page neurology on call as listed in AMION.  CT HEAD WO CONTRAST ( ) Result Date: 11/25/2023 EXAM: CT HEAD WITHOUT CONTRAST 11/25/2023 04:29:05 PM TECHNIQUE: CT of the head was performed without the administration of intravenous contrast. Automated exposure control, iterative reconstruction, and/or weight based adjustment of the mA/kV was utilized to reduce the radiation dose to as low  as reasonably achievable. COMPARISON: CT head 11/20/2023. CLINICAL HISTORY: Headache, increasing frequency or severity. FINDINGS: BRAIN AND VENTRICLES: Interval placement of a right frontal approach ventriculostomy and catheter with tip terminating in the third ventricle. Small amount of parenchymal hemorrhage along the ventriculostomy catheter tract. Unchanged mild dilation of the lateral ventricles. Interval redistribution of subarachnoid hemorrhage along the cerebral convexities and associated slight increase in intraventricular hemorrhage. ORBITS: No acute abnormality. SINUSES: No acute abnormality. SOFT TISSUES AND SKULL: No acute soft tissue abnormality. No skull fracture. IMPRESSION: 1. Interval placement of a right frontal approach ventriculostomy and catheter with tip terminating in the third ventricle. Small amount of parenchymal hemorrhage along the ventriculostomy catheter tract. 2. Unchanged mild dilation of the lateral ventricles. 3. Interval redistribution of subarachnoid hemorrhage along the cerebral convexities and associated slight increase in intraventricular hemorrhage. Electronically signed by: Ryan Chess MD 11/25/2023 04:41 PM EDT RP Workstation: [id number]   VAS US  TRANSCRANIAL DOPPLER Result Date: 11/25/2023  Transcranial Doppler Patient Name:  Andrea Bridges  Date of Exam:   11/25/2023 Medical Rec #: [id number]   Accession #:    [id number] Date of Birth: 02-21-1961   Patient Gender: F Patient Age:   31 years Exam Location:  [facility] Procedure:      VAS US  TRANSCRANIAL DOPPLER Referring Phys: TORIBIO SHARPS --------------------------------------------------------------------------------  Indications: Subarachnoid hemorrhage. History: Right frontal ventricular catheter placement (11/20/2023). Comparison Study: Previous exam on 11/23/2023 Performing Technologist: Ezzie Potters RVT, RDMS  Examination Guidelines: A complete evaluation includes B-mode imaging, spectral Doppler, color Doppler,  and power Doppler as needed of all accessible portions of each vessel. Bilateral testing is considered an integral part of a complete examination. Limited examinations for reoccurring indications may be performed as noted.  +----------+---------------+----------+-----------+--------+ RIGHT TCD Right VM (cm/s)Depth (cm)PulsatilityComment  +----------+---------------+----------+-----------+--------+ MCA             70  5.20      1.30    130 cm/s +----------+---------------+----------+-----------+--------+ ACA             -38                   1.14             +----------+---------------+----------+-----------+--------+ Term ICA        48                    1.31             +----------+---------------+----------+-----------+--------+ PCA P1          35                    1.33             +----------+---------------+----------+-----------+--------+ Opthalmic       15                    2.04             +----------+---------------+----------+-----------+--------+ ICA siphon      38                    1.14             +----------+---------------+----------+-----------+--------+ Vertebral       -33                   0.98             +----------+---------------+----------+-----------+--------+ Distal ICA      36                    1.22             +----------+---------------+----------+-----------+--------+  +----------+--------------+----------+-----------+-------+ LEFT TCD  Left VM (cm/s)Depth (cm)PulsatilityComment +----------+--------------+----------+-----------+-------+ MCA             63                   1.21            +----------+--------------+----------+-----------+-------+ ACA            -34                   1.17            +----------+--------------+----------+-----------+-------+ Term ICA        45                   1.42            +----------+--------------+----------+-----------+-------+ PCA P1          30                    1.22            +----------+--------------+----------+-----------+-------+ Opthalmic       14                   2.17            +----------+--------------+----------+-----------+-------+ ICA siphon      33                   1.11            +----------+--------------+----------+-----------+-------+ Vertebral      -34                   1.25            +----------+--------------+----------+-----------+-------+  Distal ICA      25                   1.14            +----------+--------------+----------+-----------+-------+  +------------+-------+-------+             VM cm/sComment +------------+-------+-------+ Prox Basilar  -41   0.98   +------------+-------+-------+ Dist Basilar  -52   0.96   +------------+-------+-------+ +----------------------+----+ Right Lindegaard Ratio1.94 +----------------------+----+ +---------------------+----+ Left Lindegaard Ratio2.52 +---------------------+----+  Summary: This was a normal transcranial Doppler study, with normal flow direction and velocity of all identified vessels of the anterior and posterior circulations, with no evidence of stenosis, vasospasm or occlusion. There was no evidence of intracranial disease.  *See table(s) above for TCD measurements and observations.  Diagnosing physician: Eather Popp MD Electronically signed by Eather Popp MD on 11/25/2023 at 1:18:57 PM.    Final    VAS US  TRANSCRANIAL DOPPLER Result Date: 11/25/2023  Transcranial Doppler Patient Name:  Andrea Bridges  Date of Exam:   11/23/2023 Medical Rec #: [id number]   Accession #:    [id number] Date of Birth: [birth year]   Patient Gender: F Patient Age:   84 years Exam Location:  [facility] Procedure:      VAS US  TRANSCRANIAL DOPPLER Referring Phys: TORIBIO SHARPS --------------------------------------------------------------------------------  Indications: Subarachnoid hemorrhage. History: Right frontal ventricular catheter placement (11/20/2023).  Comparison Study: Previous exam 11/21/2023 Performing Technologist: Ezzie Potters RVT, RDMS  Examination Guidelines: A complete evaluation includes B-mode imaging, spectral Doppler, color Doppler, and power Doppler as needed of all accessible portions of each vessel. Bilateral testing is considered an integral part of a complete examination. Limited examinations for reoccurring indications may be performed as noted.  +----------+---------------+----------+-----------+-------+ RIGHT TCD Right VM (cm/s)Depth (cm)PulsatilityComment +----------+---------------+----------+-----------+-------+ MCA             60                    0.92            +----------+---------------+----------+-----------+-------+ ACA             -18                   1.32            +----------+---------------+----------+-----------+-------+ Term ICA        44                    1.20            +----------+---------------+----------+-----------+-------+ PCA P1          37                    0.89            +----------+---------------+----------+-----------+-------+ Opthalmic       19                    1.85            +----------+---------------+----------+-----------+-------+ ICA siphon      20                    0.64            +----------+---------------+----------+-----------+-------+ Vertebral       -54                   0.96            +----------+---------------+----------+-----------+-------+  Distal ICA      36                    1.12            +----------+---------------+----------+-----------+-------+  +----------+--------------+----------+-----------+-------+ LEFT TCD  Left VM (cm/s)Depth (cm)PulsatilityComment +----------+--------------+----------+-----------+-------+ MCA             62                   1.05            +----------+--------------+----------+-----------+-------+ ACA            -40                   1.00             +----------+--------------+----------+-----------+-------+ Term ICA        43                   0.93            +----------+--------------+----------+-----------+-------+ PCA P1          39                   0.88            +----------+--------------+----------+-----------+-------+ Opthalmic       14                   1.91            +----------+--------------+----------+-----------+-------+ ICA siphon      43                   0.93            +----------+--------------+----------+-----------+-------+ Vertebral      -67                   0.90            +----------+--------------+----------+-----------+-------+ Distal ICA      26                   1.26            +----------+--------------+----------+-----------+-------+  +------------+-------+-------+             VM cm/sComment +------------+-------+-------+ Prox Basilar  -43   0.84   +------------+-------+-------+ Dist Basilar  -51   0.83   +------------+-------+-------+ +----------------------+----+ Right Lindegaard Ratio1.67 +----------------------+----+ +---------------------+----+ Left Lindegaard Ratio2.38 +---------------------+----+  Summary: This was a normal transcranial Doppler study, with normal flow direction and velocity of all identified vessels of the anterior and posterior circulations, with no evidence of stenosis, vasospasm or occlusion. There was no evidence of intracranial disease. *See table(s) above for TCD measurements and observations.  Diagnosing physician: Eather Popp MD Electronically signed by Eather Popp MD on 11/25/2023 at 1:17:19 PM.    Final    VAS US  TRANSCRANIAL DOPPLER Result Date: 11/25/2023  Transcranial Doppler Patient Name:  Andrea Bridges  Date of Exam:   11/21/2023 Medical Rec #: [id number]   Accession #:    [id number] Date of Birth: 21-May-1960   Patient Gender: F Patient Age:   89 years Exam Location:  [facility] Procedure:      VAS US  TRANSCRANIAL DOPPLER  Referring Phys: TORIBIO SHARPS --------------------------------------------------------------------------------  Indications: Subarachnoid hemorrhage. Limitations: Patient immobility Comparison Study: No prior studies. Performing Technologist: Cordella Collet RVT  Examination Guidelines: A complete evaluation includes B-mode imaging, spectral Doppler, color Doppler, and  power Doppler as needed of all accessible portions of each vessel. Bilateral testing is considered an integral part of a complete examination. Limited examinations for reoccurring indications may be performed as noted.  +----------+---------------+----------+-----------+------------------+ RIGHT TCD Right VM (cm/s)Depth (cm)Pulsatility     Comment       +----------+---------------+----------+-----------+------------------+ MCA             50                    1.24                       +----------+---------------+----------+-----------+------------------+ ACA                                           Unable to insonate +----------+---------------+----------+-----------+------------------+ Term ICA        25                    1.27                       +----------+---------------+----------+-----------+------------------+ PCA P1          25                    1.14                       +----------+---------------+----------+-----------+------------------+ Opthalmic       14                    1.57                       +----------+---------------+----------+-----------+------------------+ ICA siphon      22                    0.83                       +----------+---------------+----------+-----------+------------------+ Vertebral                                     Unable to insonate +----------+---------------+----------+-----------+------------------+ Distal ICA      -15                   1.19                       +----------+---------------+----------+-----------+------------------+   +----------+--------------+----------+-----------+------------------+ LEFT TCD  Left VM (cm/s)Depth (cm)Pulsatility     Comment       +----------+--------------+----------+-----------+------------------+ MCA             51                   0.91                       +----------+--------------+----------+-----------+------------------+ ACA                                          Unable to insonate +----------+--------------+----------+-----------+------------------+ Term ICA  Unable to insonate +----------+--------------+----------+-----------+------------------+ PCA P1          22                   1.29                       +----------+--------------+----------+-----------+------------------+ Opthalmic       17                   1.71                       +----------+--------------+----------+-----------+------------------+ ICA siphon                                   Unable to insonate +----------+--------------+----------+-----------+------------------+ Vertebral                                    Unable to insonate +----------+--------------+----------+-----------+------------------+ Distal ICA     -17                   1.25                       +----------+--------------+----------+-----------+------------------+  +------------+-------+------------------+             VM cm/s     Comment       +------------+-------+------------------+ Prox Basilar       Unable to insonate +------------+-------+------------------+ Dist Basilar       Unable to insonate +------------+-------+------------------+ +----------------------+----+ Right Lindegaard Ratio3.33 +----------------------+----+ +---------------------+-+ Left Lindegaard Ratio3 +---------------------+-+  Summary:  Normal mean flow velocities in anterior cerebral circulation without any evidence of vasospasm. Poor suboccipital window limits evaluation of  posterior circulation vessels. *See table(s) above for TCD measurements and observations.  Diagnosing physician: Electronically signed by Eather Popp MD on 11/25/2023 at 1:16:47 PM.    Final    IR ANGIO VERTEBRAL SEL VERTEBRAL UNI R MOD SED PROCEDURE: Diagnostic Cerebral Angiogram  SURGEON:  Dr. Gerldine Maizes, MD  HISTORY:  The patient is a 63 y.o. yo female admitted with sudden onset severe headache, obtundation, and CT scan demonstrating diffuse subarachnoid hemorrhage.  CT angiogram was negative for intracranial aneurysm however given the degree of hemorrhage and presence of hydrocephalus, there is high suspicion for underlying vascular malformation/aneurysm.  She therefore presents for diagnostic cerebral angiogram.  APPROACH:  The technical aspects of the procedure as well as its potential risks and benefits were reviewed with the patient and her husband. These risks included but were not limited bleeding, infection, allergic reaction, damage to organs/vital structures, stroke, non-diagnostic procedure, and the catastrophic outcomes of heart attack, coma, and death. With an understanding of these risks, informed consent was obtained and witnessed.   The patient was placed in the supine position on the angiography table and the skin of right groin prepped in the usual sterile fashion. The procedure was performed under local anesthesia (1%-solution of bicarbonate-bufferred Lidoacaine) and fentanyl  monitored by the in-suite nurse and myself, including non-invasive blood pressure and continuous pulse oxymetry.   Access to the right common femoral artery was obtained using standard Seldinger technique.  A short 5 French sheath was introduced and connected to continuous heparinized saline flush.  HEPARIN : 0 Units total.   CONTRAST AGENT: See IR records  FLUOROSCOPY TIME: See IR records  CATHETER(S) AND WIRE(S):   5-French JB-1 glidecatheter  0.035 glidewire   VESSELS CATHETERIZED:  Right vertebral  Right  common femoral  VESSELS STUDIED:  Right vertebral Right femoral  PROCEDURAL NARRATIVE:  A 5-Fr JB-1 terumo glide catheter was advanced over a 0.035 glidewire into the aortic arch.  Initially the right vertebral artery was selectively catheterized and cerebral angiogram taken.  There was technical malfunction of the biplane unit.  The system was restarted.  During the reboot, the patient attempted to get up from the angiogram table and her right femoral sheath was inadvertently removed.  We therefore aborted the procedure at this point.  INTERPRETATION:  Right vertebral:  Injection reveals the presence of a widely patent vertebral artery. This leads to a widely patent basilar artery that terminates in bilateral P1. The basilar apex is normal. No aneurysms, arteriovenous malformations, or high flow fistulas are visualized.  There is no posterior circulation vasospasm seen.  The parenchymal and venous phases are normal. The venous sinuses are patent.   DISPOSITION: After the procedure was aborted and the patient's sheath was removed, hemostasis was secured with manual compression.  Good proximal and distal lower extremity pulses were documented upon achievement of hemostasis.  Patient was then transferred back to the intensive care unit for further care.  IMPRESSION: 1.  Prematurely aborted diagnostic cerebral angiogram, however no aneurysms, AVMs, or fistulas are seen on the sole right vertebral artery injection.    Gerldine Maizes, MD [facility]  Electronically signed by Maizes Gerldine, MD at 11/22/2023 10:17 AM   EEG adult Result Date: 11/21/2023 Shelton Arlin KIDD, MD     11/21/2023 10:35 AM [name] MRN: [id number] Epilepsy Attending: Arlin KIDD Shelton Referring Physician/Provider: Rosemarie Eather RAMAN, MD Date: 11/21/2023 Duration: 26.33 mins Patient history: 63yo F with SAH, now with ams. EEG to evaluate for seizure Level of alertness:  comatose/ lethargic AEDs  during EEG study: LEV, propofol  Technical aspects: This EEG study was done with scalp electrodes positioned according to the 10-20 International system of electrode placement. Electrical activity was reviewed with band pass filter of 1-70Hz , sensitivity of 7 uV/mm, display speed of 22mm/sec with a 60Hz  notched filter applied as appropriate. EEG data were recorded continuously and digitally stored.  Video monitoring was available and reviewed as appropriate. Description: EEG showed continuous generalized 3 to 6 Hz theta-delta slowing with overriding 13 to 15 Hz beta activity distributed symmetrically and diffusely. Hyperventilation and photic stimulation were not performed.   ABNORMALITY - Continuous slow, generalized - Excessive beta, generalized IMPRESSION: This study is suggestive of moderate to severe diffuse encephalopathy, nonspecific etiology but likely related to sedation. No seizures or epileptiform discharges were seen throughout the recording. Arlin KIDD Shelton   DG Chest Port 1 View Result Date: 11/21/2023 EXAM: 1 VIEW XRAY OF THE CHEST 11/21/2023 06:06:16 AM COMPARISON: None available. CLINICAL HISTORY: History of ETT. ETT, SAH; ROVER. FINDINGS: LUNGS AND PLEURA: No focal pulmonary opacity. No pulmonary edema. No pleural effusion. No pneumothorax. HEART AND MEDIASTINUM: No acute abnormality of the cardiac and mediastinal silhouettes. BONES AND SOFT TISSUES: No acute osseous abnormality. LINES AND TUBES: Endotracheal tube terminates 3 cm above the carina. Gastric tube courses along the inferior border of the film. IMPRESSION: 1. No acute cardiopulmonary pathology. 2. Endotracheal tube and gastric tube in appropriate position as described . Electronically signed by: Lonni Necessary MD 11/21/2023 06:10 AM EDT RP Workstation: [id number]   DG Abd 1 View Result Date: 11/20/2023 CLINICAL DATA:  Check gastric catheter placement EXAM: ABDOMEN - 1 VIEW COMPARISON:  None Available. FINDINGS: Gastric  catheter extends into the stomach. No free air is seen. No obstructive changes are noted. IMPRESSION: Gastric catheter within the stomach. Electronically Signed   By: Oneil Devonshire M.D.   On: 11/20/2023 20:34   CT HEAD CODE STROKE WO CONTRAST Result Date: 11/20/2023 EXAM: CT HEAD WITHOUT CONTRAST 11/20/2023 04:14:00 PM TECHNIQUE: CT of the head was performed without the administration of intravenous contrast. Automated exposure control, iterative reconstruction, and/or weight based adjustment of the mA/kV was utilized to reduce the radiation dose to as low as reasonably achievable. COMPARISON: None available. CLINICAL HISTORY: Neuro deficit, acute, stroke suspected. Chief complaints; headache; CT HEAD CODE STROKE WO CONTRAST; Neuro deficit, acute, stroke suspected; Deficits- right side weakness, non verbal FINDINGS: BRAIN AND VENTRICLES: Diffuse basilar subarachnoid hemorrhage with intraventricular extension and early ventriculomegaly. The hemorrhage is greatest along the left cerebellopontine angle cistern possibly indicating the site of origin. Attention on follow up CTA. ORBITS: No acute abnormality. SINUSES: No acute abnormality. SOFT TISSUES AND SKULL: No acute soft tissue abnormality. No skull fracture. IMPRESSION: 1. Diffuse basilar subarachnoid hemorrhage with intraventricular extension and early ventriculomegaly, greatest along the left cerebellopontine angle cistern, possibly indicating the site of origin. Attention on follow-up CTA. Code stroke results were communicated to Dr. Matthews at 4:15 pm on 11/20/2023 by phone. Electronically signed by: Ryan Chess MD 11/20/2023 04:35 PM EDT RP Workstation: [id number]   CT ANGIO HEAD NECK W WO CM (CODE STROKE) Result Date: 11/20/2023 EXAM: CTA HEAD AND NECK WITH AND WITHOUT 11/20/2023 04:15:00 PM TECHNIQUE: CTA of the head and neck was performed with and without the administration of intravenous contrast. Multiplanar 2D and/or 3D reformatted images are  provided for review. Automated exposure control, iterative reconstruction, and/or weight based adjustment of the mA/kV was utilized to reduce the radiation dose to as low as reasonably achievable. Stenosis of the internal carotid arteries measured using NASCET criteria. COMPARISON: None available CLINICAL HISTORY: Neuro deficit, acute, stroke suspected. Chief complaints; headache; CT ANGIO HEAD NECK W WO CM (CODE STROKE); Neuro deficit, acute, stroke suspected. FINDINGS: CTA NECK: AORTIC ARCH AND ARCH VESSELS: No dissection or arterial injury. No significant stenosis of the brachiocephalic or subclavian arteries. CERVICAL CAROTID ARTERIES: No dissection, arterial injury, or hemodynamically significant stenosis by NASCET criteria. Tortuous cervical ICAs. CERVICAL VERTEBRAL ARTERIES: Abrupt tapering of the V4 segment of both vertebral arteries, concerning for early vasospasm, which could be masking an aneurysm. LUNGS AND MEDIASTINUM: Unremarkable. SOFT TISSUES: No acute abnormality. BONES: No acute abnormality. CTA HEAD: ANTERIOR CIRCULATION: No significant stenosis of the internal carotid arteries. No significant stenosis of the anterior cerebral arteries. No significant stenosis of the middle cerebral arteries. No aneurysm. POSTERIOR CIRCULATION: Persistent fetal origin of the right PCA. Abrupt tapering of the distal basilar artery, concerning for early vasospasm, which could be masking an aneurysm. No significant stenosis of the posterior cerebral arteries. No significant stenosis of the basilar artery. No significant stenosis of the vertebral arteries. OTHER: No dural venous sinus thrombosis on this non-dedicated study. IMPRESSION: 1. No definite aneurysm or vascular malformation is identified. Abrupt tapering of the V4 segment of both vertebral arteries and the distal basilar artery, concerning for early vasospasm, which could be masking an aneurysm. Code stroke results were communicated to Dr. Matthews at 4:26 pm  on 11/20/2023 by phone. Electronically signed by: Ryan Chess MD 11/20/2023 04:33 PM EDT RP Workstation: [id number]    There are no new results  to review at this time.  Previous records (including but not limited to H&P, progress notes, nursing notes, TOC management) were reviewed in assessment of this patient.  Labs: CBC: Recent Labs  Lab 12/02/23 0643 12/03/23 0458 12/06/23 1054 12/07/23 0423 12/08/23 0418  WBC 12.5* 10.3 15.3* 9.8 6.6  HGB 12.2 11.5* 12.7 12.2 12.2  HCT 35.1* 33.2* 36.8 35.9* 36.0  MCV 80.1 80.0 80.7 81.4 81.6  PLT 342 357 442* 428* 383   Basic Metabolic Panel: Recent Labs  Lab 12/03/23 0458 12/03/23 0459 12/04/23 0613 12/05/23 0624 12/06/23 0447 12/06/23 1054 12/07/23 0423 12/08/23 0418  NA  --    < > 133* 133* 137 131* 132* 133*  K  --    < > 4.5 3.6 3.7 3.5 4.2 3.5  CL  --    < > 102 99 103 100 101 103  CO2  --    < > 19* 23 23 17* 21* 22  GLUCOSE  --    < > 100* 99 101* 144* 118* 104*  BUN  --    < > 14 15 19 21 21 17   CREATININE  --    < > 0.55 0.62 0.73 0.65 0.77 0.71  CALCIUM  --    < > 9.1 9.4 9.4 9.0 9.3 9.1  MG 2.3  --   --   --   --   --   --   --   PHOS  --    < > 3.1 3.9 3.4  --  3.1 2.4*   < > = values in this interval not displayed.   Liver Function Tests: Recent Labs  Lab 12/05/23 0624 12/06/23 0447 12/06/23 1054 12/07/23 0423 12/08/23 0418  AST  --   --  27  --   --   ALT  --   --  35  --   --   ALKPHOS  --   --  81  --   --   BILITOT  --   --  0.7  --   --   PROT  --   --  6.8  --   --   ALBUMIN 2.9* 3.1* 3.3* 3.0* 3.0*   CBG: Recent Labs  Lab 12/07/23 2021 12/07/23 2345 12/08/23 0436 12/08/23 0744 12/08/23 1145  GLUCAP 113* 106* 111* 126* 109*    Scheduled Meds:  bethanechol   10 mg Oral TID   Chlorhexidine  Gluconate Cloth  6 each Topical Daily   enoxaparin  (LOVENOX ) injection  40 mg Subcutaneous QPM   feeding supplement  237 mL Oral BID BM   fiber supplement (BANATROL TF)  60 mL Oral BID    levETIRAcetam   500 mg Oral BID   nicotine   14 mg Transdermal Daily   niMODipine   60 mg Oral Q4H   nystatin   5 mL Oral QID   mouth rinse  15 mL Mouth Rinse Q4H   pantoprazole   40 mg Oral Daily   sodium chloride   2 g Oral TID   tamsulosin   0.4 mg Oral Daily   topiramate   25 mg Oral BID   Continuous Infusions:  promethazine  (PHENERGAN ) injection (IM or IVPB) Stopped (11/23/23 1952)   PRN Meds:.acetaminophen  **OR** acetaminophen  (TYLENOL ) oral liquid 160 mg/5 mL **OR** acetaminophen , butalbital -acetaminophen -caffeine , loperamide  HCl, ondansetron  (ZOFRAN ) IV, mouth rinse, oxyCODONE , promethazine  (PHENERGAN ) injection (IM or IVPB), white petrolatum   Family Communication: None at bedside  Disposition: Status is: Inpatient Remains inpatient appropriate because: SAH     Time spent: 33 minutes  Length of inpatient stay: 18 days  Author: Carliss LELON Canales, DO 12/08/2023 12:27 PM  For on call review [email or web address].

## 2023-12-09 ENCOUNTER — Inpatient Hospital Stay (HOSPITAL_COMMUNITY)
Admission: AD | Admit: 2023-12-09 | Discharge: 2023-12-21 | DRG: 057 | Disposition: A | Discharge status: Home / Self Care | Source: Intra-hospital | Attending: Physical Medicine and Rehabilitation | Admitting: Physical Medicine and Rehabilitation

## 2023-12-09 ENCOUNTER — Encounter (HOSPITAL_COMMUNITY): Payer: Self-pay | Admitting: Physical Medicine and Rehabilitation

## 2023-12-09 ENCOUNTER — Other Ambulatory Visit: Payer: Self-pay

## 2023-12-09 DIAGNOSIS — R519 Headache, unspecified: Secondary | ICD-10-CM | POA: Diagnosis not present

## 2023-12-09 DIAGNOSIS — R569 Unspecified convulsions: Secondary | ICD-10-CM

## 2023-12-09 DIAGNOSIS — R531 Weakness: Secondary | ICD-10-CM | POA: Diagnosis present

## 2023-12-09 DIAGNOSIS — E871 Hypo-osmolality and hyponatremia: Secondary | ICD-10-CM | POA: Diagnosis present

## 2023-12-09 DIAGNOSIS — Z79899 Other long term (current) drug therapy: Secondary | ICD-10-CM

## 2023-12-09 DIAGNOSIS — Z6829 Body mass index (BMI) 29.0-29.9, adult: Secondary | ICD-10-CM

## 2023-12-09 DIAGNOSIS — E282 Polycystic ovarian syndrome: Secondary | ICD-10-CM | POA: Diagnosis present

## 2023-12-09 DIAGNOSIS — M5432 Sciatica, left side: Secondary | ICD-10-CM | POA: Diagnosis present

## 2023-12-09 DIAGNOSIS — F17210 Nicotine dependence, cigarettes, uncomplicated: Secondary | ICD-10-CM | POA: Diagnosis present

## 2023-12-09 DIAGNOSIS — E66811 Obesity, class 1: Secondary | ICD-10-CM | POA: Diagnosis present

## 2023-12-09 DIAGNOSIS — M5431 Sciatica, right side: Secondary | ICD-10-CM | POA: Diagnosis present

## 2023-12-09 DIAGNOSIS — R509 Fever, unspecified: Secondary | ICD-10-CM

## 2023-12-09 DIAGNOSIS — N319 Neuromuscular dysfunction of bladder, unspecified: Secondary | ICD-10-CM | POA: Diagnosis present

## 2023-12-09 DIAGNOSIS — I69119 Unspecified symptoms and signs involving cognitive functions following nontraumatic intracerebral hemorrhage: Secondary | ICD-10-CM | POA: Diagnosis not present

## 2023-12-09 DIAGNOSIS — Z7985 Long-term (current) use of injectable non-insulin antidiabetic drugs: Secondary | ICD-10-CM

## 2023-12-09 DIAGNOSIS — R339 Retention of urine, unspecified: Secondary | ICD-10-CM

## 2023-12-09 DIAGNOSIS — I959 Hypotension, unspecified: Secondary | ICD-10-CM | POA: Diagnosis not present

## 2023-12-09 DIAGNOSIS — I951 Orthostatic hypotension: Secondary | ICD-10-CM | POA: Diagnosis present

## 2023-12-09 DIAGNOSIS — I69098 Other sequelae following nontraumatic subarachnoid hemorrhage: Secondary | ICD-10-CM | POA: Diagnosis not present

## 2023-12-09 DIAGNOSIS — Z818 Family history of other mental and behavioral disorders: Secondary | ICD-10-CM

## 2023-12-09 DIAGNOSIS — G4700 Insomnia, unspecified: Secondary | ICD-10-CM | POA: Diagnosis present

## 2023-12-09 DIAGNOSIS — E876 Hypokalemia: Secondary | ICD-10-CM | POA: Diagnosis present

## 2023-12-09 DIAGNOSIS — M51369 Other intervertebral disc degeneration, lumbar region without mention of lumbar back pain or lower extremity pain: Secondary | ICD-10-CM | POA: Diagnosis present

## 2023-12-09 DIAGNOSIS — Z8349 Family history of other endocrine, nutritional and metabolic diseases: Secondary | ICD-10-CM

## 2023-12-09 DIAGNOSIS — I610 Nontraumatic intracerebral hemorrhage in hemisphere, subcortical: Secondary | ICD-10-CM | POA: Diagnosis not present

## 2023-12-09 DIAGNOSIS — N390 Urinary tract infection, site not specified: Secondary | ICD-10-CM | POA: Diagnosis present

## 2023-12-09 DIAGNOSIS — Z85828 Personal history of other malignant neoplasm of skin: Secondary | ICD-10-CM

## 2023-12-09 LAB — RESPIRATORY PANEL BY PCR

## 2023-12-09 LAB — GLUCOSE, CAPILLARY: Glucose-Capillary: 114 mg/dL — ABNORMAL HIGH (ref 70–99)

## 2023-12-09 LAB — CREATININE, SERUM
Creatinine, Ser: 0.76 mg/dL (ref 0.44–1.00)
GFR, Estimated: >60 mL/min (ref >60)

## 2023-12-09 LAB — CBC
HCT: 33.4 % — ABNORMAL LOW (ref 36.0–46.0)
Hemoglobin: 11.1 g/dL — ABNORMAL LOW (ref 12.0–15.0)
MCH: 27.3 pg (ref 26.0–34.0)
MCHC: 33.2 g/dL (ref 30.0–36.0)
MCV: 82.3 fL (ref 80.0–100.0)
Platelets: 363 K/uL (ref 150–400)
RBC: 4.06 MIL/uL (ref 3.87–5.11)
RDW: 14.4 % (ref 11.5–15.5)
WBC: 7.4 K/uL (ref 4.0–10.5)
nRBC: 0 % (ref 0.0–0.2)

## 2023-12-09 LAB — RENAL FUNCTION PANEL
Albumin: 2.9 g/dL — ABNORMAL LOW (ref 3.5–5.0)
Anion gap: 10 (ref 5–15)
BUN: 14 mg/dL (ref 8–23)
CO2: 20 mmol/L — ABNORMAL LOW (ref 22–32)
Calcium: 9.1 mg/dL (ref 8.9–10.3)
Chloride: 104 mmol/L (ref 98–111)
Creatinine, Ser: 0.78 mg/dL (ref 0.44–1.00)
GFR, Estimated: >60 mL/min (ref >60)
Glucose, Bld: 106 mg/dL — ABNORMAL HIGH (ref 70–99)
Phosphorus: 2.7 mg/dL (ref 2.5–4.6)
Potassium: 3.4 mmol/L — ABNORMAL LOW (ref 3.5–5.1)
Sodium: 134 mmol/L — ABNORMAL LOW (ref 135–145)

## 2023-12-09 LAB — RESP PANEL BY RT-PCR (RSV, FLU A&B, COVID)  RVPGX2
Influenza A by PCR: NEGATIVE
Influenza B by PCR: NEGATIVE
Resp Syncytial Virus by PCR: NEGATIVE
SARS Coronavirus 2 by RT PCR: NEGATIVE

## 2023-12-09 LAB — CULTURE, BLOOD (ROUTINE X 2)

## 2023-12-09 MED ORDER — ORAL CARE MOUTH RINSE: 15.0000 mL | OROMUCOSAL | Status: DC | PRN

## 2023-12-09 MED ORDER — ONDANSETRON HCL 4 MG/2ML IJ SOLN: 4.0000 mg | Freq: Four times a day (QID) | INTRAMUSCULAR | Status: DC | PRN

## 2023-12-09 MED ORDER — OXYCODONE HCL 5 MG PO TABS: 5.0000 mg | ORAL_TABLET | ORAL | Status: DC | PRN

## 2023-12-09 MED ORDER — WHITE PETROLATUM EX OINT: 1.0000 | TOPICAL_OINTMENT | CUTANEOUS | Status: DC | PRN

## 2023-12-09 MED ORDER — ACETAMINOPHEN 160 MG/5ML PO SOLN: 650.0000 mg | ORAL | Status: DC | PRN

## 2023-12-09 MED ORDER — CHLORHEXIDINE GLUCONATE CLOTH 2 % EX PADS
6.0000 | MEDICATED_PAD | Freq: Two times a day (BID) | CUTANEOUS | Status: DC
  Administered 2023-12-10 – 2023-12-11 (×3): 6 via TOPICAL

## 2023-12-09 MED ORDER — OXYCODONE HCL 5 MG PO TABS
5.0000 mg | ORAL_TABLET | ORAL | Status: DC | PRN
  Administered 2023-12-20 – 2023-12-21 (×6): 5 mg via ORAL
  Filled 2023-12-09 (×4): qty 1

## 2023-12-09 MED ORDER — BUTALBITAL-APAP-CAFFEINE 50-325-40 MG PO TABS: 1.0000 | ORAL_TABLET | Freq: Four times a day (QID) | ORAL | Status: DC | PRN

## 2023-12-09 MED ORDER — LEVETIRACETAM 500 MG PO TABS: 500.0000 mg | ORAL_TABLET | Freq: Two times a day (BID) | ORAL | Status: DC

## 2023-12-09 MED ORDER — NICOTINE 14 MG/24HR TD PT24
14.0000 mg | MEDICATED_PATCH | Freq: Every day | TRANSDERMAL | Status: DC
Start: 2023-12-10 — End: 2023-12-21
  Administered 2023-12-10 – 2023-12-21 (×15): 14 mg via TRANSDERMAL
  Filled 2023-12-09 (×12): qty 1

## 2023-12-09 MED ORDER — ORAL CARE MOUTH RINSE
15.0000 mL | OROMUCOSAL | Status: DC
  Administered 2023-12-09 – 2023-12-10 (×2): 15 mL via OROMUCOSAL

## 2023-12-09 MED ORDER — SULFAMETHOXAZOLE-TRIMETHOPRIM 800-160 MG PO TABS
1.0000 | ORAL_TABLET | Freq: Two times a day (BID) | ORAL | Status: DC
Start: 2023-12-09 — End: 2023-12-21

## 2023-12-09 MED ORDER — LEVETIRACETAM 500 MG PO TABS
500.0000 mg | ORAL_TABLET | Freq: Two times a day (BID) | ORAL | Status: DC
  Administered 2023-12-09 – 2023-12-21 (×29): 500 mg via ORAL
  Filled 2023-12-09 (×24): qty 1

## 2023-12-09 MED ORDER — ENOXAPARIN SODIUM 40 MG/0.4ML IJ SOSY: 40.0000 mg | PREFILLED_SYRINGE | Freq: Every evening | INTRAMUSCULAR | Status: DC

## 2023-12-09 MED ORDER — ACETAMINOPHEN 325 MG PO TABS: 650.0000 mg | ORAL_TABLET | ORAL | Status: DC | PRN

## 2023-12-09 MED ORDER — LOPERAMIDE HCL 1 MG/7.5ML PO SUSP: 2.0000 mg | ORAL | Status: DC | PRN

## 2023-12-09 MED ORDER — ACETAMINOPHEN 325 MG PO TABS
650.0000 mg | ORAL_TABLET | ORAL | Status: DC | PRN
  Administered 2023-12-09 – 2023-12-11 (×2): 650 mg via ORAL
  Filled 2023-12-09 (×2): qty 2

## 2023-12-09 MED ORDER — NYSTATIN 100000 UNIT/ML MT SUSP
5.0000 mL | Freq: Four times a day (QID) | OROMUCOSAL | Status: DC
  Administered 2023-12-13 – 2023-12-21 (×16): 500000 [IU] via ORAL
  Filled 2023-12-09 (×26): qty 5

## 2023-12-09 MED ORDER — TOPIRAMATE 25 MG PO TABS
25.0000 mg | ORAL_TABLET | Freq: Two times a day (BID) | ORAL | Status: DC
  Administered 2023-12-09 – 2023-12-13 (×8): 25 mg via ORAL
  Filled 2023-12-09 (×8): qty 1

## 2023-12-09 MED ORDER — WHITE PETROLATUM EX OINT: TOPICAL_OINTMENT | CUTANEOUS | Status: DC | PRN

## 2023-12-09 MED ORDER — PANTOPRAZOLE SODIUM 40 MG PO TBEC: 40.0000 mg | DELAYED_RELEASE_TABLET | Freq: Every day | ORAL | Status: DC

## 2023-12-09 MED ORDER — ENSURE PLUS HIGH PROTEIN PO LIQD
237.0000 mL | Freq: Two times a day (BID) | ORAL | Status: DC
  Administered 2023-12-10 – 2023-12-20 (×22): 237 mL via ORAL

## 2023-12-09 MED ORDER — TAMSULOSIN HCL 0.4 MG PO CAPS
0.8000 mg | ORAL_CAPSULE | Freq: Every day | ORAL | Status: DC
Start: 2023-12-10 — End: 2023-12-13
  Administered 2023-12-10 – 2023-12-12 (×3): 0.8 mg via ORAL
  Filled 2023-12-09 (×4): qty 2

## 2023-12-09 MED ORDER — ACETAMINOPHEN 650 MG RE SUPP: 650.0000 mg | RECTAL | Status: DC | PRN

## 2023-12-09 MED ORDER — NIMODIPINE 6 MG/ML PO SOLN
60.0000 mg | ORAL | Status: AC
  Administered 2023-12-09 – 2023-12-11 (×12): 60 mg via ORAL
  Filled 2023-12-09 (×12): qty 10

## 2023-12-09 MED ORDER — NIMODIPINE 6 MG/ML PO SOLN: 60.0000 mg | ORAL | Status: DC

## 2023-12-09 MED ORDER — BUTALBITAL-APAP-CAFFEINE 50-325-40 MG PO TABS
1.0000 | ORAL_TABLET | Freq: Four times a day (QID) | ORAL | Status: DC | PRN
  Administered 2023-12-10 – 2023-12-13 (×7): 1 via ORAL
  Filled 2023-12-09 (×8): qty 1

## 2023-12-09 MED ORDER — ENSURE PLUS HIGH PROTEIN PO LIQD: 237.0000 mL | Freq: Two times a day (BID) | ORAL | Status: DC

## 2023-12-09 MED ORDER — BANATROL TF EN LIQD
60.0000 mL | Freq: Two times a day (BID) | ENTERAL | Status: DC
Start: 2023-12-09 — End: 2023-12-21

## 2023-12-09 MED ORDER — NYSTATIN 100000 UNIT/ML MT SUSP: 5.0000 mL | Freq: Four times a day (QID) | OROMUCOSAL | Status: DC

## 2023-12-09 MED ORDER — NICOTINE 14 MG/24HR TD PT24: 14.0000 mg | MEDICATED_PATCH | Freq: Every day | TRANSDERMAL | Status: DC

## 2023-12-09 MED ORDER — SODIUM CHLORIDE 1 G PO TABS
2.0000 g | ORAL_TABLET | Freq: Three times a day (TID) | ORAL | Status: DC
  Administered 2023-12-09 – 2023-12-14 (×14): 2 g via ORAL
  Filled 2023-12-09 (×13): qty 2

## 2023-12-09 MED ORDER — SODIUM CHLORIDE 1 G PO TABS: 2.0000 g | ORAL_TABLET | Freq: Three times a day (TID) | ORAL | Status: DC

## 2023-12-09 MED ORDER — BANATROL TF EN LIQD
60.0000 mL | Freq: Two times a day (BID) | ENTERAL | Status: DC
  Filled 2023-12-09 (×2): qty 60

## 2023-12-09 MED ORDER — BETHANECHOL CHLORIDE 10 MG PO TABS: 10.0000 mg | ORAL_TABLET | Freq: Three times a day (TID) | ORAL | Status: DC

## 2023-12-09 MED ORDER — SULFAMETHOXAZOLE-TRIMETHOPRIM 800-160 MG PO TABS
1.0000 | ORAL_TABLET | Freq: Two times a day (BID) | ORAL | Status: AC
  Administered 2023-12-09 – 2023-12-13 (×8): 1 via ORAL
  Filled 2023-12-09 (×8): qty 1

## 2023-12-09 MED ORDER — TAMSULOSIN HCL 0.4 MG PO CAPS: 0.8000 mg | ORAL_CAPSULE | Freq: Every day | ORAL | Status: DC

## 2023-12-09 MED ORDER — TOPIRAMATE 25 MG PO TABS: 25.0000 mg | ORAL_TABLET | Freq: Two times a day (BID) | ORAL | Status: DC

## 2023-12-09 MED ORDER — ENOXAPARIN SODIUM 40 MG/0.4ML IJ SOSY
40.0000 mg | PREFILLED_SYRINGE | INTRAMUSCULAR | Status: DC
  Administered 2023-12-10 – 2023-12-20 (×12): 40 mg via SUBCUTANEOUS
  Filled 2023-12-09 (×11): qty 0.4

## 2023-12-09 MED ORDER — BETHANECHOL CHLORIDE 10 MG PO TABS
10.0000 mg | ORAL_TABLET | Freq: Three times a day (TID) | ORAL | Status: DC
  Administered 2023-12-09 – 2023-12-11 (×5): 10 mg via ORAL
  Filled 2023-12-09 (×5): qty 1

## 2023-12-09 MED ORDER — PANTOPRAZOLE SODIUM 40 MG PO TBEC
40.0000 mg | DELAYED_RELEASE_TABLET | Freq: Every day | ORAL | Status: DC
  Administered 2023-12-10 – 2023-12-21 (×15): 40 mg via ORAL
  Filled 2023-12-09 (×12): qty 1

## 2023-12-09 NOTE — Progress Notes (Signed)
 Urbano Albright, MD  Physician Physical Medicine and Rehabilitation   Consult Note    Signed   Date of Service: 12/06/2023 10:33 AM  Related encounter: ED to Hosp-Admission (Current) from 11/20/2023 in [facility]   Signed     Expand All Collapse All           Physical Medicine and Rehabilitation Consult Reason for Consult:Rehab Referring Physician: Dr. Burlene     HPI: Andrea Bridges is a 63 y.o. female with PMH of  PCOS, tobacco use, anxiety who presented with headache and altered mental status.  CT head showed diffuse basilar subarachnoid hemorrhage with intraventricular extension and early ventriculomegaly.  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  Patient had right frontal ventricular catheter placed by Dr. Gillie on 11/20/2023.  7/15 had limited angiogram completed that did not show aneurysms, AVMs or fistulas.  Patient required replacement of the right frontal ventricular catheter due to prior catheter becoming nonfunctional.  Ventricular catheter was removed on 7/26.  Patient noted to have waxing and waning confusion.  Patient was treated for UTI.  She also had urinary retention requiring Foley catheter.  Core track was discontinued, following with SLP.  Patient seen by PT and OT and found to have functional deficits.  Yesterday patient was able to ambulate with min assist, sit to stand mod assist.  Today patient was noted to have convulsing episodes by son and was initially documented as unresponsive not following commands.  She has been less responsive since this time.  Neurosurgery ordered EEG and started Keppra .  EEG did not show seizures or epileptiform discharges.   Patient lives in a [place] home with 2 steps to enter.  He is going to college on 8/15 daughter but family is working on  other people to possibly assist her.  She could also stay with her brother who lives about 2 hours away but could provide 24/7 assistance.   Home: Home Living Family/patient expects to be discharged to:: Private residence Living Arrangements: Children Available Help at Discharge: Family, Friend(s), Available PRN/intermittently Type of Home: House Home Access: Stairs to enter [employer] of Steps: one step down, two steps up Entrance Stairs-Rails: None Home Layout: One level Bathroom Shower/Tub: Psychologist, counselling, Hydrographic surveyor, Sport and exercise psychologist: Standard Home Equipment: None  Lives With: Spouse  Functional History: Prior Function Prior Level of Function : Independent/Modified Independent, Working/employed, Driving Mobility Comments: indep, no AD ADLs Comments: indep, works full time Functional Status:  Mobility: Bed Mobility Overal bed mobility: Needs Assistance Bed Mobility: Rolling, Sidelying to Sit Rolling: Mod assist, Used rails Sidelying to sit: Mod assist Supine to sit: Mod assist, Max assist Sit to supine: Contact guard assist Sit to sidelying: Max assist General bed mobility comments: VC for sequencing and assist for LE management. Pt initiate w/ LE but needs additional time Transfers Overall transfer level: Needs assistance Equipment used: Rolling walker (2 wheels) Transfers: Sit to/from Stand Sit to Stand: Mod assist, +2 safety/equipment Bed to/from chair/wheelchair/BSC transfer type:: Step pivot Step pivot transfers: Mod assist, +2 safety/equipment General transfer comment: Assist for power up and steadying. VC for hand placement for walker management. Ambulation/Gait Ambulation/Gait assistance: +2 safety/equipment, Min assist Gait Distance (Feet): 70 Feet Assistive device: Rolling walker (2 wheels) Gait Pattern/deviations: Step-through pattern, Decreased stride length, Trunk flexed, Drifts right/left General Gait Details: VC to avoid  colliding w/  obstacles, upright, and staying inside walker. Assist with walker management when drifting laterally to avoid collision. Gait velocity: Decreased Gait velocity interpretation: <1.8 ft/sec, indicate of risk for recurrent falls Pre-gait activities: Marching in place w/ RW   ADL: ADL Overall ADL's : Needs assistance/impaired Eating/Feeding: Independent Grooming: Minimal assistance, Sitting Upper Body Bathing: Moderate assistance, Sitting Lower Body Bathing: Maximal assistance, Sit to/from stand Upper Body Dressing : Maximal assistance, Sitting Lower Body Dressing: Maximal assistance, Sit to/from stand Lower Body Dressing Details (indicate cue type and reason): pt able to don socks at the EOB Toilet Transfer: Minimal assistance, Stand-pivot, BSC/3in1, Rolling walker (2 wheels) Toileting- Clothing Manipulation and Hygiene: Total assistance, Sit to/from stand Functional mobility during ADLs: Supervision/safety General ADL Comments: limited by HA   Cognition: Cognition Overall Cognitive Status: Impaired/Different from baseline Arousal/Alertness: Awake/alert Orientation Level:  (UTA) Year: 2025 Month: July Day of Week: Incorrect Attention: Sustained Sustained Attention: Impaired Sustained Attention Impairment: Verbal basic, Verbal complex Memory: Impaired Memory Impairment: Storage deficit, Retrieval deficit Awareness: Impaired Awareness Impairment: Intellectual impairment, Emergent impairment Problem Solving: Impaired Problem Solving Impairment: Verbal complex, Functional complex Executive Function: Self Correcting Initiating: Impaired Initiating Impairment: Verbal basic, Verbal complex Self Correcting: Impaired Self Correcting Impairment: Functional complex, Functional basic, Verbal complex Safety/Judgment: Impaired Comments: patient requiring cues to refrain from scratching head at surgical site and from pulling on Cortrak feeding tube Cognition Arousal: Alert Behavior During  Therapy: [facility] for tasks assessed/performed Overall Cognitive Status: Impaired/Different from baseline     Review of Systems  Reason unable to perform ROS: Limited by AMS.       Past Medical History:  Diagnosis Date   Acute appendicitis with perforation and localized peritonitis, without abscess or gangrene 12/11/2019   Anxiety     Infertility, female     PCOS (polycystic ovarian syndrome)     Skin cancer      basal cell   SOBOE (shortness of breath on exertion) 09/08/2022             Past Surgical History:  Procedure Laterality Date   APPENDECTOMY   2021   BREAST BIOPSY       IR ANGIO INTRA EXTRACRAN SEL INTERNAL CAROTID BILAT MOD SED   11/29/2023   IR ANGIO VERTEBRAL SEL VERTEBRAL BILAT MOD SED   11/29/2023   IR ANGIO VERTEBRAL SEL VERTEBRAL UNI R MOD SED   11/22/2023   IR US  GUIDE VASC ACCESS RIGHT   11/29/2023             Family History  Problem Relation Age of Onset   Thyroid  disease Mother     Anxiety disorder Mother     Obesity Mother     Cancer Father          skin   Sleep apnea Father          Social History:  reports that she has been smoking cigarettes and e-cigarettes. She has never used smokeless tobacco. She reports that she does not currently use alcohol. She reports that she does not use drugs. Allergies:  Allergies  No Known Allergies         Medications Prior to Admission  Medication Sig Dispense Refill   ibuprofen (ADVIL) 200 MG tablet Take 400 mg by mouth every 6 (six) hours as needed for moderate pain (pain score 4-6).       Semaglutide -Weight Management (WEGOVY ) 1.7 MG/0.75ML SOAJ Inject 1.7 mg into the skin once a week. 3 mL 1  Vitamin D , Ergocalciferol , (DRISDOL ) 1.25 MG (50000 UNIT) CAPS capsule Take 1 capsule (50,000 Units total) by mouth every 7 (seven) days. 12 capsule 0            Blood pressure (!) 132/117, pulse (!) 56, temperature (!) 102.1 F (38.9 C), temperature source Rectal, resp. rate (!) 53, height 5' 7 (1.702 m), weight  85.3 kg, SpO2 98%. Physical Exam   General: No apparent distress, laying in bed HEENT: Cranial incision with staples intact, CDI.  MMM Neck: Supple without JVD or lymphadenopathy Heart: Reg rate and rhythm. No murmurs rubs or gallops Chest: CTA bilaterally without wheezes, rales, or rhonchi; no distress Abdomen: Soft, non-tender, non-distended, bowel sounds positive. Extremities: No clubbing, cyanosis, or edema. Pulses are 2+ Psych: Flat Skin: Clean and intact without signs of breakdown GU: Foley with clear yellow urine Neuro:     Mental Status: Alert, oriented only to person not answering other orientation questions, able to give her daughter's name.  Intermittently following commands. Speech/Languate: Delayed responses.  Does not respond to most questions today.  Occasionally answering in one-word answers.  Hypophonia. CRANIAL NERVES: Pupils reactive, EOMI, no facial weakness noted, tongue midline   MOTOR: Bilateral upper extremities at least 4 out of 5 Unable to follow commands for lower extremity MMT, moving both legs spontaneously     REFLEXES: DTR 2+ patella and Achilles bilaterally, sustained clonus left ankle, 2-3 beats of clonus right ankle   SENSORY: Normal to touch all 4 extremities       Lab Results Last 24 Hours       Results for orders placed or performed during the hospital encounter of 11/20/23 (from the past 24 hours)  Glucose, capillary     Status: Abnormal    Collection Time: 12/05/23 11:32 AM  Result Value Ref Range    Glucose-Capillary 131 (H) 70 - 99 mg/dL  Glucose, capillary     Status: Abnormal    Collection Time: 12/05/23  3:06 PM  Result Value Ref Range    Glucose-Capillary 141 (H) 70 - 99 mg/dL    Comment 1 Notify RN    Glucose, capillary     Status: Abnormal    Collection Time: 12/05/23  7:43 PM  Result Value Ref Range    Glucose-Capillary 153 (H) 70 - 99 mg/dL    Comment 1 Notify RN      Comment 2 Document in Chart    Glucose, capillary      Status: None    Collection Time: 12/05/23 11:10 PM  Result Value Ref Range    Glucose-Capillary 93 70 - 99 mg/dL    Comment 1 Notify RN      Comment 2 Document in Chart    Glucose, capillary     Status: Abnormal    Collection Time: 12/06/23  4:25 AM  Result Value Ref Range    Glucose-Capillary 106 (H) 70 - 99 mg/dL    Comment 1 Notify RN      Comment 2 Document in Chart    Renal function panel     Status: Abnormal    Collection Time: 12/06/23  4:47 AM  Result Value Ref Range    Sodium 137 135 - 145 mmol/L    Potassium 3.7 3.5 - 5.1 mmol/L    Chloride 103 98 - 111 mmol/L    CO2 23 22 - 32 mmol/L    Glucose, Bld 101 (H) 70 - 99 mg/dL    BUN 19 8 - 23 mg/dL  Creatinine, Ser 0.73 0.44 - 1.00 mg/dL    Calcium 9.4 8.9 - 89.6 mg/dL    Phosphorus 3.4 2.5 - 4.6 mg/dL    Albumin 3.1 (L) 3.5 - 5.0 g/dL    GFR, Estimated >39 >39 mL/min    Anion gap 11 5 - 15  Glucose, capillary     Status: Abnormal    Collection Time: 12/06/23  9:04 AM  Result Value Ref Range    Glucose-Capillary 144 (H) 70 - 99 mg/dL    Comment 1 Notify RN      Comment 2 Document in Chart         Imaging Results (Last 48 hours)  CT HEAD WO CONTRAST ( ) Result Date: 12/06/2023 CLINICAL DATA:  Headache, sudden, severe EXAM: CT HEAD WITHOUT CONTRAST TECHNIQUE: Contiguous axial images were obtained from the base of the skull through the vertex without intravenous contrast. RADIATION DOSE REDUCTION: This exam was performed according to the departmental dose-optimization program which includes automated exposure control, adjustment of the mA and/or kV according to patient size and/or use of iterative reconstruction technique. COMPARISON:  CT the head dated December 03, 2023. FINDINGS: Brain: Since the previous study, a right frontal approach ventriculostomy catheter has been removed. There is some residual hemorrhage along the tract. There has been interval near complete resolution of intraventricular hemorrhage. There has  also been interval near complete resolution of subarachnoid hemorrhage. The ventricles and sulci are proportionate in size. There are dystrophic calcifications present anterior laterally within the left cerebellar hemisphere on image 8 of series 3. Vascular: Mild vascular calcifications. Skull: Right frontal burr craniotomy in defect. Otherwise, unremarkable. Sinuses/Orbits: Mild mucosal disease within the right maxillary sinus. The mastoid air cells are clear. Other: None. IMPRESSION: 1. There is a cluster of dystrophic calcifications present anterior laterally within the left cerebellar hemisphere. Recommend correlation with an MRI of the brain without and with gadolinium contrast to exclude neoplasm. 2. Interval near total resolution of intraventricular hemorrhage and subarachnoid hemorrhage following removal of a right sided ventriculostomy catheter. There is mild residual hemorrhage along the tract. Electronically Signed   By: Evalene Coho M.D.   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 Yes Co-Morbidities requiring supervision/potential complications:  - Hyperglycemia, hyponatremia, UTI, fevers with encephalopathy, dysphagia, diarrhea, urinary retention, history of tobacco abuse Due to bladder management, bowel management, safety, skin/wound care, disease management, medication administration, pain management, and patient education, does the patient require 24 hr/day rehab nursing? Yes Does the patient require coordinated care of a physician, rehab nurse, therapy disciplines of PT, OT, SLP to address physical and functional deficits in the context of the above medical diagnosis(es)? Yes Addressing deficits in the following areas:  balance, endurance, locomotion, strength, transferring, bowel/bladder control, bathing, dressing, feeding, grooming, toileting, cognition, speech, language, swallowing, and psychosocial support Can the patient actively participate in an intensive therapy program of at least 3 hrs of therapy per day at least 5 days per week? Yes The potential for patient to make measurable gains while on inpatient rehab is excellent Anticipated functional outcomes upon discharge from inpatient rehab are supervision and min assist  with PT, supervision and min assist with OT, supervision and min assist with SLP. Estimated rehab length of stay to reach the  above functional goals is: 10-14 Anticipated discharge destination: Home Overall Rehab/Functional Prognosis: excellent   POST ACUTE RECOMMENDATIONS: This patient's condition is appropriate for continued rehabilitative care in the following setting: CIR and SNF Patient has agreed to participate in recommended program. Potentially Note that insurance prior authorization may be required for reimbursement for recommended care.   Comment: I think patient would be a good candidate for CIR if able to confirm 24/7 assistance after discharge.  Patient potentially may be able to have friends watch her or she may be able to stay with her brother.  Rehab coordinator to follow-up.  Did have episode of cognitive worsening today, potentially seizure related that is currently being worked up.     I have personally performed a face to face diagnostic evaluation of this patient. Additionally, I have examined the patient's medical record including any pertinent labs and radiographic images.     Thanks,   Murray Collier, MD 12/06/2023

## 2023-12-09 NOTE — Progress Notes (Signed)
 Urbano Albright, MD  Physician Physical Medicine and Rehabilitation   PMR Pre-admission    Signed   Date of Service: 12/09/2023 12:37 PM  Related encounter: ED to Hosp-Admission (Current) from 11/20/2023 in [facility]   Signed     Expand All Collapse All  PMR Admission Coordinator Pre-Admission Assessment   Patient: Andrea Bridges is an 63 y.o., female MRN: [id number] DOB: [birth year] Height: 5' 7 (170.2 cm) Weight: 86.3 kg                                                                                                                                                  Insurance Information HMO:     PPO: yes     PCP:      IPA:      80/20:      OTHER:  PRIMARY: Producer, television/film/video [employer])     Policy#: [id number]      Subscriber: pt CM Name: Amy      Phone#: [phone]     Fax#: [phone] Pre-Cert#: [id number] auth for CIR from Amy with updates due to her at fax listed above on 8/8      Employer:  Benefits:  Phone #: [phone]     Name:  Eff. Date: 05/11/23     Deduct: $3300 (met)      Out of Pocket Max: $5000 (met)      Life Max:   CIR: 80%      SNF: 80% Outpatient: 80%     Co-Pay: 20% Home Health: 80%      Co-Pay: 20% DME: 80%     Co-Pay: 20% Providers:  SECONDARY:       Policy#:       Phone#:    Artist:       Phone#:    The Engineer, materials Information Summary" for patients in Inpatient Rehabilitation Facilities with attached "Privacy Act Statement-Health Care Records" was provided and verbally reviewed with: Patient and Family   Emergency Contact Information Contact Information       Name Relation Home Work Mobile    Garnavillo Daughter [phone]             Other Contacts       Name Relation Home Work Mobile    Kohn,Mike Significant other     [phone]         Current Medical History  Patient Admitting Diagnosis: nontraumatic SAH with hydrocephalus, s/p EVD (d/c on 7/27)   History of Present Illness: Pt is a 63 y/o female  with PMH of anxiety, tobacco use, and skin cancer who admitted to [facility] on 11/20/23 with c/o of sudden onset thunderclap headache. She was initially AOx4, vomiting, and answering questions and then became unresponsive outside of noxious stimuli.  Code stroke called and pt was intubated for airway protection.  On Neurology arrival pt unresponsive, moaning, with disconjugate gaze, R hemiparesis, and not following commands. Her NIHSS was 27.  Labs revealed mild hypokalemia and hypocalcemia.  CT head revealed diffuse basilar SAH with IVH extension and early ventriculomegaly, greatest noted along the left cerebellopontine angle cistern.  CTA head/neck showed no definite aneurysm or vascular malformation, but did show concern for early vasospasm in V4 segment of bilateral vertebral arteries and distal basilar artery.  Neurosurgery was consulted and Dr. Gillie placed EVD at bedside on 7/13.  It was clamped on 7/25 and removed on 7/26.  On the morning on 7/29 pt developed acute AMS with decreased responsiveness and convulsing.  Stat CT stable from previous and EEG negative for seizures afterwards but did show mild/moderate diffuse encephalopathy.  She was started on keppra .  After this event she did have a fever and mildly elevated WBC which has resolved within 24 hours.  On 7/30 she was back to baseline.  Therapy ongoing and pt has been recommended for CIR.    Complete NIHSS TOTAL: 2 Glasgow Coma Scale Score: 15   Patient's medical record from [name] has been reviewed by the rehabilitation admission coordinator and physician.   Past Medical History      Past Medical History:  Diagnosis Date   Acute appendicitis with perforation and localized peritonitis, without abscess or gangrene 12/11/2019   Anxiety     Infertility, female     PCOS (polycystic ovarian syndrome)     Skin cancer      basal cell   SOBOE (shortness of breath on exertion) 09/08/2022          Has the patient had major surgery  during 100 days prior to admission? Yes   Family History  family history includes Anxiety disorder in her mother; Cancer in her father; Obesity in her mother; Sleep apnea in her father; Thyroid  disease in her mother.     Current Medications   Current Medications    Current Facility-Administered Medications:    acetaminophen  (TYLENOL ) tablet 650 mg, 650 mg, Oral, Q4H PRN, 650 mg at 12/09/23 0946 **OR** acetaminophen  (TYLENOL ) 160 MG/5ML solution 650 mg, 650 mg, Per Tube, Q4H PRN, 650 mg at 12/02/23 0744 **OR** acetaminophen  (TYLENOL ) suppository 650 mg, 650 mg, Rectal, Q4H PRN, [name], MD   bethanechol  (URECHOLINE ) tablet 10 mg, 10 mg, Oral, TID, Alva, Rakesh V, MD, 10 mg at 12/09/23 9053   butalbital -acetaminophen -caffeine  (FIORICET ) 50-325-40 MG per tablet 1-2 tablet, 1-2 tablet, Oral, Q6H PRN, Cabbell, Kyle, MD, 1 tablet at 12/08/23 1421   Chlorhexidine  Gluconate Cloth 2 % PADS 6 each, 6 each, Topical, Daily, [name], MD, 6 each at 12/09/23 0950   enoxaparin  (LOVENOX ) injection 40 mg, 40 mg, Subcutaneous, QPM, Sethi, Pramod S, MD, 40 mg at 12/08/23 1810   feeding supplement (ENSURE PLUS HIGH PROTEIN) liquid 237 mL, 237 mL, Oral, BID BM, Clark, Laura P, DO, 237 mL at 12/07/23 1433   fiber supplement (BANATROL TF) liquid 60 mL, 60 mL, Oral, BID, Alva, Rakesh V, MD, 60 mL at 12/09/23 0946   levETIRAcetam  (KEPPRA ) tablet 500 mg, 500 mg, Oral, BID, Nundkumar, Neelesh, MD, 500 mg at 12/09/23 0945   loperamide  HCl (IMODIUM ) 1 MG/7.5ML suspension 2 mg, 2 mg, Oral, PRN, Alva, Rakesh V, MD   nicotine  (NICODERM CQ  - dosed in mg/24 hours) patch 14 mg, 14 mg, Transdermal, Daily, Jeanell, Paras, MD, 14 mg at 12/09/23 0947   [DISCONTINUED] niMODipine  (NIMOTOP ) capsule 60 mg, 60 mg,  Oral, Q4H, 60 mg at 12/06/23 1500 **OR** niMODipine  (NYMALIZE ) 6 MG/ML oral solution 60 mg, 60 mg, Oral, Q4H, Walden, Jeffrey H, MD, 60 mg at 12/09/23 0946   nystatin  (MYCOSTATIN ) 100000 UNIT/ML suspension  500,000 Units, 5 mL, Oral, QID, Arlon Carliss ORN, DO, 500,000 Units at 12/09/23 9048   ondansetron  (ZOFRAN ) injection 4 mg, 4 mg, Intravenous, Q6H PRN, Remi Pippin, NP, 4 mg at 12/06/23 9156   Oral care mouth rinse, 15 mL, Mouth Rinse, PRN, Chand, Sudham, MD   Oral care mouth rinse, 15 mL, Mouth Rinse, Q4H, Clark, Laura P, DO, 15 mL at 12/09/23 9048   oxyCODONE  (Oxy IR/ROXICODONE ) immediate release tablet 5 mg, 5 mg, Oral, Q4H PRN, Gretta Doffing P, DO, 5 mg at 12/03/23 0000   pantoprazole  (PROTONIX ) EC tablet 40 mg, 40 mg, Oral, Daily, 40 mg at 12/09/23 0946 **OR** [DISCONTINUED] pantoprazole  (PROTONIX ) injection 40 mg, 40 mg, Intravenous, Daily, [name], MD, 40 mg at 12/01/23 1021   promethazine  (PHENERGAN ) 12.5 mg in sodium chloride  0.9 % 50 mL IVPB, 12.5 mg, Intravenous, Q6H PRN, Cabbell, Kyle, MD, Stopped at 11/23/23 1952   sodium chloride  tablet 2 g, 2 g, Oral, TID, Alva, Rakesh V, MD, 2 g at 12/09/23 9053   sulfamethoxazole -trimethoprim  (BACTRIM  DS) 800-160 MG per tablet 1 tablet, 1 tablet, Oral, Q12H, Calkins, Derek W, DO, 1 tablet at 12/09/23 9053   tamsulosin  (FLOMAX ) capsule 0.8 mg, 0.8 mg, Oral, Daily, Calkins, Derek W, DO, 0.8 mg at 12/09/23 0945   topiramate  (TOPAMAX ) tablet 25 mg, 25 mg, Oral, BID, Alva, Rakesh V, MD, 25 mg at 12/09/23 9053   white petrolatum  (VASELINE) gel, , Topical, PRN, Chand, Sudham, MD     Patients Current Diet:  Diet Order                  Diet - low sodium heart healthy             Diet regular Room service appropriate? Yes; Fluid consistency: Thin  Diet effective now                         Precautions / Restrictions Precautions Precautions: Fall Precaution/Restrictions Comments: SBP <160 Restrictions Weight Bearing Restrictions Per Provider Order: No    Has the patient had 2 or more falls or a fall with injury in the past year?No   Prior Activity Level Community (5-7x/wk): independent without DME, driving, working full time    Prior Functional Level Prior Function Prior Level of Function : Independent/Modified Independent, Working/employed, Driving Mobility Comments: indep, no AD ADLs Comments: indep, works full time   Self Care: Did the patient need help bathing, dressing, using the toilet or eating?  Independent   Indoor Mobility: Did the patient need assistance with walking from room to room (with or without device)? Independent   Stairs: Did the patient need assistance with internal or external stairs (with or without device)? Independent   Functional Cognition: Did the patient need help planning regular tasks such as shopping or remembering to take medications? Independent   Patient Information Are you of Hispanic, Latino/a,or Spanish origin?: A. No, not of Hispanic, Latino/a, or Spanish origin What is your race?: A. White Do you need or want an interpreter to communicate with a doctor or health care staff?: 0. No   Patient's Response To:  Health Literacy and Transportation Is the patient able to respond to health literacy and transportation needs?: Yes Health Literacy - How often  do you need to have someone help you when you read instructions, pamphlets, or other written material from your doctor or pharmacy?: Never In the past 12 months, has lack of transportation kept you from medical appointments or from getting medications?: No In the past 12 months, has lack of transportation kept you from meetings, work, or from getting things needed for daily living?: No   Home Assistive Devices / Equipment Home Equipment: None   Prior Device Use: Indicate devices/aids used by the patient prior to current illness, exacerbation or injury? None of the above   Current Functional Level Cognition   Arousal/Alertness: Awake/alert Overall Cognitive Status: Impaired/Different from baseline Orientation Level: Oriented X4 Attention: Sustained Sustained Attention: Impaired Sustained Attention Impairment: Verbal  basic, Verbal complex Memory: Impaired Memory Impairment: Storage deficit, Retrieval deficit Awareness: Impaired Awareness Impairment: Intellectual impairment, Emergent impairment Problem Solving: Impaired Problem Solving Impairment: Verbal complex, Functional complex Executive Function: Self Correcting Initiating: Impaired Initiating Impairment: Verbal basic, Verbal complex Self Correcting: Impaired Self Correcting Impairment: Functional complex, Functional basic, Verbal complex Safety/Judgment: Impaired Comments: patient requiring cues to refrain from scratching head at surgical site and from pulling on Cortrak feeding tube    Extremity Assessment (includes Sensation/Coordination)   Upper Extremity Assessment: Generalized weakness (difficsult to assess. PROM is [facility]. vrey minimal active movement)  Lower Extremity Assessment: Defer to PT evaluation     ADLs   Overall ADL's : Needs assistance/impaired Eating/Feeding: Independent Grooming: Maximal assistance Upper Body Bathing: Moderate assistance, Sitting Lower Body Bathing: Total assistance Upper Body Dressing : Maximal assistance Lower Body Dressing: Total assistance Lower Body Dressing Details (indicate cue type and reason): total A for socks Toilet Transfer: Total assistance Toilet Transfer Details (indicate cue type and reason): bed level Toileting- Clothing Manipulation and Hygiene: Total assistance, Sit to/from stand Functional mobility during ADLs: Total assistance General ADL Comments: pt is now total A for all aspects of self care/mobility and sitting balance.     Mobility   Overal bed mobility: Needs Assistance Bed Mobility: Rolling, Sidelying to Sit, Sit to Supine Rolling: Total assist Sidelying to sit: Total assist Supine to sit: Mod assist Sit to supine: Total assist Sit to sidelying: Max assist General bed mobility comments: very minimal activation or attempt to assist this date     Transfers   Overall  transfer level: Needs assistance Equipment used: Rolling walker (2 wheels) Transfers: Sit to/from Stand Sit to Stand: Mod assist Bed to/from chair/wheelchair/BSC transfer type:: Step pivot Step pivot transfers: Mod assist, +2 safety/equipment General transfer comment: unable to progress beyond EOB     Ambulation / Gait / Stairs / Wheelchair Mobility   Ambulation/Gait Ambulation/Gait assistance: Mod assist Gait Distance (Feet): 5 Feet Assistive device: Rolling walker (2 wheels) Gait Pattern/deviations: Step-through pattern, Decreased stride length, Trunk flexed, Knees buckling General Gait Details: Cues for sequencing, increased step length, and RW proximity. Assist for RW management and balance. Pts knees noted to increase flexion with increased fatigue with pt unable to achieve extension requiring cues for seated rest. Increased effort to advance BLE Gait velocity: Decreased Gait velocity interpretation: <1.8 ft/sec, indicate of risk for recurrent falls Pre-gait activities: Marching in place w/ RW     Posture / Balance Dynamic Sitting Balance Sitting balance - Comments: posterior and L lateral lean -max-total A needed for sitting balance to prevent fall Balance Overall balance assessment: Needs assistance Sitting-balance support: Feet supported Sitting balance-Leahy Scale: Poor Sitting balance - Comments: posterior and L lateral lean -max-total A needed for sitting  balance to prevent fall Postural control: Posterior lean Standing balance support: Bilateral upper extremity supported, Reliant on assistive device for balance, During functional activity Standing balance-Leahy Scale: Poor Standing balance comment: Pt reliant on RW and min-mod A to maintain standing balance due to posterior lean     Special considerations/ Life events Diabetic management A1C 5.5        Previous Home Environment (from acute therapy documentation) Living Arrangements: Children  Lives With:  Spouse Available Help at Discharge: Family, Friend(s), Available PRN/intermittently Type of Home: House Home Layout: One level Home Access: Stairs to enter Entrance Stairs-Rails: None Entrance Stairs-Number of Steps: one step down, two steps up [employer] Shower/Tub: Psychologist, counselling, Barista unit, Sport and exercise psychologist: Standard Home Care Services: No   Discharge Living Setting Plans for Discharge Living Setting: Lives with (comment) (brother, David's) Type of Home at Discharge: House Discharge Home Layout: Two level, 1/2 bath on main level, Other (Comment) (can set up downstairs office as bedroom) Alternate Level Stairs-Number of Steps: full flight Discharge Bathroom Shower/Tub: Tub/shower unit, Walk-in shower Discharge Bathroom Toilet: Standard Discharge Bathroom Accessibility: Yes How Accessible: Accessible via walker Does the patient have any problems obtaining your medications?: No   Social/Family/Support Systems Patient Roles: Parent Contact Information: daughter, [name], is a Holiday representative at [employer] Anticipated Caregiver: Daughter [name] is coordinating contact, brother alm and his family will provide care (spoke to his daughter Manuelita as well) Anticipated Industrial/product designer Information: [phone] Ability/Limitations of Caregiver: none stated Caregiver Availability: 24/7 Discharge Plan Discussed with Primary Caregiver: Yes Is Caregiver In Agreement with Plan?: Yes Does Caregiver/Family have Issues with Lodging/Transportation while Pt is in Rehab?: No     Goals Patient/Family Goal for Rehab: PT/OT/SLP supervision Expected length of stay: 10-14 days Additional Information: Discharge plan: pt will d/c to her brother's home in [place].  alm and his wife are retired and can provide 24/7 assist.  Their daughter works remotely and can provide additional assist during the day if needed.  They are able to set [name] up on the first floor if she's not able to access guest bed/bath  on second floor Pt/Family Agrees to Admission and willing to participate: Yes Program Orientation Provided & Reviewed with Pt/Caregiver Including Roles  & Responsibilities: Yes     Decrease burden of Care through IP rehab admission: no      Possible need for SNF placement upon discharge: Not anticipated.  Plan for pt to stay with her brother, alm, and his wife at discharge.  They are retired and can provide expected level of care.      Patient Condition: This patient's condition remains as documented in the consult dated 12/06/23, in which the Rehabilitation Physician determined and documented that the patient's condition is appropriate for intensive rehabilitative care in an inpatient rehabilitation facility. Will admit to inpatient rehab today.   Preadmission Screen Completed By:  Reche FORBES Lowers, PT, DPT 12/09/2023 12:41 PM ______________________________________________________________________   Discussed status with Dr. Urbano on 12/09/23 at 12:41 PM  and received approval for admission today.   Admission Coordinator:  Keygan Dumond E Jaila Schellhorn, PT, DPT time 12:41 PM Pattricia 12/09/23             Revision History

## 2023-12-09 NOTE — Progress Notes (Signed)
 Physical Therapy Treatment Patient Details Name: Andrea Bridges MRN: [id number] DOB: 09-Jun-1960 Today's Date: 12/09/2023   History of Present Illness Pt is a 63 y.o. female who presented 11/20/23 with a headache. Upon arrival to ED, she became nauseated and unresponsive. 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 S/p R frontal ventricular catheter placement 7/13. ETT 7/13-7/14. Cortrak placed 7/14. S/p diagnostic cerebral angiogram 7/15, but prematurely aborted due to pt agitation. 7/21 new EVD placed overnight early AM due to malfunction of old catheter, plan for repeat angiogram 7/22. S/p EVD removal 7/26. PMH: anxiety, skin cancer    PT Comments  Pt received in supine asleep, but easily awoken and agreeable to session. Pt able to perform bed mobility with mod A and transfers with up to min A. Pt able to tolerate increased gait distance with CGA and 1 seated rest break this session. Pt requires frequent cues throughout gait trial with pt able to follow. Pt demonstrates improved activity tolerance, but continues to be limited by weakness and fatigue. Pt continues to benefit from PT services to progress toward functional mobility goals.    If plan is discharge home, recommend the following: Assistance with cooking/housework;Direct supervision/assist for medications management;Direct supervision/assist for financial management;Assist for transportation;Help with stairs or ramp for entrance;Supervision due to cognitive status;A lot of help with walking and/or transfers;A lot of help with bathing/dressing/bathroom   Can travel by private vehicle        Equipment Recommendations  Other (comment) (TBD)    Recommendations for Other Services       Precautions / Restrictions Precautions Precautions: Fall Recall of Precautions/Restrictions: Impaired Precaution/Restrictions Comments: SBP <160 Restrictions Weight Bearing Restrictions Per Provider Order: No     Mobility  Bed  Mobility Overal bed mobility: Needs Assistance Bed Mobility: Sidelying to Sit, Rolling Rolling: Contact guard assist Sidelying to sit: Mod assist       General bed mobility comments: Cues for sequencing and initiation. mod A for trunk elevation    Transfers Overall transfer level: Needs assistance Equipment used: Rolling walker (2 wheels) Transfers: Sit to/from Stand Sit to Stand: Min assist, Contact guard assist           General transfer comment: STS from EOB with min A progressing to CGA from recliner. cues for hand placement    Ambulation/Gait Ambulation/Gait assistance: Contact guard assist, +2 safety/equipment Gait Distance (Feet): 60 Feet (x2) Assistive device: Rolling walker (2 wheels) Gait Pattern/deviations: Step-through pattern, Decreased stride length, Trunk flexed, Decreased step length - left Gait velocity: Decreased     General Gait Details: short step-through pattern with cues for upright posture, RW proximity, pacing, and increased step length (L>R). BLE weakness noted, but no buckling. chair follow for safety with pt requiring seated rest break due to fatigue. Pt tended to increase pace and shorten steps with fatigue requiring cues for pacing and safety   Stairs             Wheelchair Mobility     Tilt Bed    Modified Rankin (Stroke Patients Only) Modified Rankin (Stroke Patients Only) Pre-Morbid Rankin Score: No symptoms Modified Rankin: Moderately severe disability     Balance Overall balance assessment: Needs assistance Sitting-balance support: Feet supported Sitting balance-Leahy Scale: Fair Sitting balance - Comments: CGA sitting EOB   Standing balance support: Bilateral upper extremity supported, Reliant on assistive device for balance, During functional activity Standing balance-Leahy Scale: Poor Standing balance comment: with RW support  Communication Communication Communication:  Impaired Factors Affecting Communication: Difficulty expressing self;Reduced clarity of speech  Cognition Arousal: Alert Behavior During Therapy: WFL for tasks assessed/performed   PT - Cognitive impairments: Sequencing, Problem solving, Initiation, Safety/Judgement                         Following commands: Impaired Following commands impaired: Follows one step commands inconsistently    Cueing Cueing Techniques: Verbal cues, Tactile cues  Exercises      General Comments        Pertinent Vitals/Pain Pain Assessment Pain Assessment: Faces Faces Pain Scale: No hurt     PT Goals (current goals can now be found in the care plan section) Acute Rehab PT Goals Patient Stated Goal: to improve PT Goal Formulation: With patient/family Time For Goal Achievement: 12/07/23 Progress towards PT goals: Progressing toward goals    Frequency    Min 2X/week       AM-PAC PT 6 Clicks Mobility   Outcome Measure  Help needed turning from your back to your side while in a flat bed without using bedrails?: A Little Help needed moving from lying on your back to sitting on the side of a flat bed without using bedrails?: A Lot Help needed moving to and from a bed to a chair (including a wheelchair)?: A Little Help needed standing up from a chair using your arms (e.g., wheelchair or bedside chair)?: A Little Help needed to walk in hospital room?: A Little Help needed climbing 3-5 steps with a railing? : Total 6 Click Score: 15    End of Session Equipment Utilized During Treatment: Gait belt Activity Tolerance: Patient tolerated treatment well Patient left: in chair;with call bell/phone within reach;with family/visitor present;with chair alarm set Nurse Communication: Mobility status PT Visit Diagnosis: Unsteadiness on feet (R26.81);Other abnormalities of gait and mobility (R26.89);Pain     Time: 8965-8943 PT Time Calculation (min) (ACUTE ONLY): 22 min  Charges:     $Gait Training: 8-22 mins PT General Charges $$ ACUTE PT VISIT: 1 Visit                     Darryle George, PTA Acute Rehabilitation Services Secure Chat Preferred  [phone]    Darryle George 12/09/2023, 1:19 PM

## 2023-12-09 NOTE — Progress Notes (Signed)
 Inpatient Rehab Admissions Coordinator:    I have insurance approval and a bed available for pt to admit to CIR today. Dr. Darci in agreement and [facility] aware.  I've notified pt/family and they are in agreement to admit today.  I will make arrangements.    Reche Lowers, PT, DPT Admissions Coordinator [phone] 12/09/23  12:36 PM

## 2023-12-09 NOTE — Discharge Instructions (Addendum)
 Inpatient Rehab Discharge Instructions  Andrea Bridges Discharge date and time: No discharge date for patient encounter.   Activities/Precautions/ Functional Status: Activity: As tolerated Diet: Regular Wound Care: Routine skin checks Functional status:  ___ No restrictions     ___ Walk up steps independently ___ 24/7 supervision/assistance   ___ Walk up steps with assistance ___ Intermittent supervision/assistance  ___ Bathe/dress independently ___ Walk with walker     _x__ Bathe/dress with assistance ___ Walk Independently    ___ Shower independently ___ Walk with assistance    ___ Shower with assistance ___ No alcohol     ___ Return to work/school ________  Special Instructions: No driving smoking or alcohol  Follow-up outpatient for incidental findings of 7 mm nodular density in the medial right lung base  Follow-up outpatient with urology services   COMMUNITY REFERRALS UPON DISCHARGE:     Outpatient: PT      OT     ST             Agency:[facility]  [phone]                        Address: [address] [place], [place]                                 Appointment Date/Time:*Please expect follow-up within 7-10 business days to schedule your appointment. If you have not received follow-up, be sure to contact the site directly.*      My questions have been answered and I understand these instructions. I will adhere to these goals and the provided educational materials after my discharge from the hospital.  Patient/Caregiver Signature _______________________________ Date __________  Clinician Signature _______________________________________ Date __________  Please bring this form and your medication list with you to all your follow-up doctor's appointments.

## 2023-12-09 NOTE — Discharge Summary (Signed)
 Physician Discharge Summary   Patient: Andrea Bridges MRN: [id number] DOB: [birth year]  Admit date:     11/20/2023  Discharge date: 12/09/23  Discharge Physician: Concepcion Riser   PCP: Catherine Charlies LABOR, DO   Recommendations at discharge:    Discharge to inpatient rehab. Follow PCP and neurosurgery upon discharge as instructed.  Discharge Diagnoses: Principal Problem:   SAH (subarachnoid hemorrhage) (HCC)  Resolved Problems:   * No resolved hospital problems. *  Hospital Course: 63 y.o. female with PMH of  PCOS, tobacco use, anxiety who presented with headache and altered mental status.  CT head showed diffuse basilar subarachnoid hemorrhage with intraventricular extension and early ventriculomegaly.  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  Patient had right frontal ventricular catheter placed by Dr. Gillie on 11/20/2023.  7/15 had limited angiogram completed that did not show aneurysms, AVMs or fistulas.  Patient required replacement of the right frontal ventricular catheter due to prior catheter becoming nonfunctional.  Ventricular catheter was removed on 7/26.  Patient noted to have waxing and waning confusion.  Patient was treated for UTI.  She also had urinary retention requiring Foley catheter.  Core track was discontinued, following with SLP.  Patient seen by PT and OT and found to have functional deficits.  Yesterday patient was able to ambulate with min assist, sit to stand mod assist.     This AM, patient had acute altered mental status and shaking episodes.  Nonresponsive not following commands.  Had stat CT scan which was negative and EEG done which showed mild to moderate diffuse encephalopathy and no seizures.  Keppra  initiated. She had fever yesterday, need to follow fever work up. On bactrim  for possible UTI. She  is being discharged to inpatient rehab.   Assessment and Plan:  Nontraumatic subarachnoid hemorrhage with hydrocephalus S/p EVD 7/13 with replacement 7/21.  Ventricular cath removed 7/26. Followed closely by neurosurgery.  Continue Keppra , Nimotop .  She is more alert, awake. Continue serial neuro checks.   Acute encephalopathy with fevers CSF cultures NGTD.  E.coli UTI- Completed ceftriaxone x 7 days. 1/4 blood cultures positive for staph likely contamination. Follow COVID/ RVP panel. Bactrim  started yesterday for possible UTI in the setting of foley catheter..   Acute urinary retention Failed voiding trial x2 7/29 and 7/30 requiring In-N-Out catheter.  Subsequently ordered Foley catheter.  Flomax  and bethanechol  on board.  Increased Flomax  to 0.8 mg daily.   Continue voiding trial in 2-3 days as she works with rehab.SABRA   Dysphagia with poor p.o. intake Appetite increasing.  core track removed 7/28.  SLP followed, she is on regular diet.   Hyponatremia Avoid hypotonic fluids.  Restrict free water.  Salt tabs 3 times daily.   Dispo- Discharge to CIR/inpatient rehab.       Consultants: Neurology, neurosurgery Procedures performed: s/p EVD.  Disposition: Rehabilitation facility Diet recommendation:  Discharge Diet Orders (From admission, onward)     Start     Ordered   12/09/23 0000  Diet - low sodium heart healthy        12/09/23 1238           Cardiac diet DISCHARGE MEDICATION: Allergies as of 12/09/2023   No Known Allergies      Medication List     STOP taking these medications    ibuprofen 200 MG tablet Commonly known as: ADVIL  Vitamin D  (Ergocalciferol ) 1.25 MG (50000 UNIT) Caps capsule Commonly known as: DRISDOL    Wegovy  1.7 MG/0.75ML Soaj Generic drug: Semaglutide -Weight Management       TAKE these medications    acetaminophen  325 MG tablet Commonly known as: TYLENOL  Take 2 tablets (650 mg total) by mouth every 4 (four) hours as needed for  mild pain (pain score 1-3) or fever (or temp > 37.5 C (99.5 F)).   bethanechol  10 MG tablet Commonly known as: URECHOLINE  Take 1 tablet (10 mg total) by mouth 3 (three) times daily.   butalbital -acetaminophen -caffeine  50-325-40 MG tablet Commonly known as: FIORICET  Take 1-2 tablets by mouth every 6 (six) hours as needed for headache.   feeding supplement Liqd Take 237 mLs by mouth 2 (two) times daily between meals.   fiber supplement (BANATROL TF) liquid Take 60 mLs by mouth 2 (two) times daily.   levETIRAcetam  500 MG tablet Commonly known as: KEPPRA  Take 1 tablet (500 mg total) by mouth 2 (two) times daily.   loperamide  HCl 1 MG/7.5ML suspension Commonly known as: IMODIUM  Take 15 mLs (2 mg total) by mouth as needed for diarrhea or loose stools.   nicotine  14 mg/24hr patch Commonly known as: NICODERM CQ  - dosed in mg/24 hours Place 1 patch (14 mg total) onto the skin daily. Start taking on: December 10, 2023   niMODipine  6 MG/ML Soln Commonly known as: NYMALIZE  Take 10 mLs (60 mg total) by mouth every 4 (four) hours.   nystatin  100000 UNIT/ML suspension Commonly known as: MYCOSTATIN  Take 5 mLs (500,000 Units total) by mouth 4 (four) times daily.   ondansetron  4 MG/2ML Soln injection Commonly known as: ZOFRAN  Inject 2 mLs (4 mg total) into the vein every 6 (six) hours as needed for nausea or vomiting.   oxyCODONE  5 MG immediate release tablet Commonly known as: Oxy IR/ROXICODONE  Take 1 tablet (5 mg total) by mouth every 4 (four) hours as needed for severe pain (pain score 7-10).   pantoprazole  40 MG tablet Commonly known as: PROTONIX  Take 1 tablet (40 mg total) by mouth daily. Start taking on: December 10, 2023   sodium chloride  1 g tablet Take 2 tablets (2 g total) by mouth 3 (three) times daily.   sulfamethoxazole -trimethoprim  800-160 MG tablet Commonly known as: BACTRIM  DS Take 1 tablet by mouth every 12 (twelve) hours.   tamsulosin  0.4 MG Caps capsule Commonly  known as: FLOMAX  Take 2 capsules (0.8 mg total) by mouth daily. Start taking on: December 10, 2023   topiramate  25 MG tablet Commonly known as: TOPAMAX  Take 1 tablet (25 mg total) by mouth 2 (two) times daily.   white petrolatum  Oint Commonly known as: VASELINE Apply 1 Application topically as needed for lip care.               Discharge Care Instructions  (From admission, onward)           Start     Ordered   12/09/23 0000  Discharge wound care:       Comments: Redress wound with transparent dressing.   12/09/23 1238            Discharge Exam: Filed Weights   12/06/23 0500 12/07/23 0444 12/08/23 0500  Weight: 85.3 kg 85.6 kg 86.3 kg      12/09/2023   11:39 AM 12/09/2023    7:41 AM 12/09/2023    3:24 AM  Vitals with BMI  Systolic 94 103 116  Diastolic 58 67 71  Pulse 76 72  33   General - Elderly Caucasian female, sitting, no apparent distress HEENT - PERRLA, EOMI, atraumatic head, non tender sinuses. Lung - Clear, diffuse rales, no rhonchi, wheezes. Heart - S1, S2 heard, no murmurs, rubs, trace pedal edema. Abdomen - Soft, non tender, bowel sounds good, foley intact Neuro - Alert, awake and oriented x 3, non focal exam. Skin - Warm and dry.  Condition at discharge: stable  The results of significant diagnostics from this hospitalization (including imaging, microbiology, ancillary and laboratory) are listed below for reference.   Imaging Studies: DG CHEST PORT 1 VIEW Result Date: 12/08/2023 CLINICAL DATA:  Fever EXAM: PORTABLE CHEST 1 VIEW COMPARISON:  Chest x-ray 11/26/2023 FINDINGS: The heart size and mediastinal contours are within normal limits. There is a 7 mm nodular density in the medial right lung base. The lungs are otherwise clear. There is no pleural effusion or pneumothorax. The visualized skeletal structures are unremarkable. IMPRESSION: 1. No acute cardiopulmonary process. 2. 7 mm nodular density in the medial right lung base. Recommend further  evaluation with chest CT. Electronically Signed   By: Greig Pique M.D.   On: 12/08/2023 15:42   EEG adult Result Date: 12/06/2023 Gregg Lek, MD     12/06/2023 11:22 AM Patient Name: Judythe Postema MRN: [id number] Epilepsy Attending: Lek Gregg Referring Physician/Provider: Date: 12/06/2023 Duration: 25 minutes  Patient history: 64 year old woman with  SAH, now with seizure like activity, EEG to rule out seizures Level of alertness: Awake, drowsy, sleep AEDs during EEG study: Technical aspects: This EEG study was done with scalp electrodes positioned according to the 10-20 International system of electrode placement. Electrical activity was reviewed with band pass filter of 1-70Hz , sensitivity of 7 uV/mm, display speed of 52mm/sec with a 60Hz  notched filter applied as appropriate. EEG data were recorded continuously and digitally stored.  Video monitoring was available and reviewed as appropriate. Description: The posterior dominant rhythm consists of 3-7 Hz activity of moderate voltage (25-35 uV) seen predominantly in posterior head regions, symmetric and reactive to eye opening and eye closing. Drowsiness was characterized by attenuation of the posterior background rhythm. Sleep was not seen. EEG showed continuous generalized polymorphic sharply contoured 3 to 6 Hz theta-delta slowing. Physiologic photic driving was not seen during photic stimulation.  Hyperventilation was not performed.   ABNORMALITY - Continuous slow, generalized IMPRESSION: This study is suggestive of mild to moderate diffuse encephalopathy, nonspecific etiology but likely related to sedation, toxic-metabolic etiology. No seizures or epileptiform discharges were seen throughout the recording. Lek Gregg MD Neurology    CT HEAD WO CONTRAST ( ) Result Date: 12/06/2023 CLINICAL DATA:  Headache, sudden, severe EXAM: CT HEAD WITHOUT CONTRAST TECHNIQUE: Contiguous axial images were obtained from the base of the skull through the vertex  without intravenous contrast. RADIATION DOSE REDUCTION: This exam was performed according to the departmental dose-optimization program which includes automated exposure control, adjustment of the mA and/or kV according to patient size and/or use of iterative reconstruction technique. COMPARISON:  CT the head dated December 03, 2023. FINDINGS: Brain: Since the previous study, a right frontal approach ventriculostomy catheter has been removed. There is some residual hemorrhage along the tract. There has been interval near complete resolution of intraventricular hemorrhage. There has also been interval near complete resolution of subarachnoid hemorrhage. The ventricles and sulci are proportionate in size. There are dystrophic calcifications present anterior laterally within the left cerebellar hemisphere on image 8 of series 3. Vascular: Mild vascular calcifications. Skull: Right frontal burr craniotomy in  defect. Otherwise, unremarkable. Sinuses/Orbits: Mild mucosal disease within the right maxillary sinus. The mastoid air cells are clear. Other: None. IMPRESSION: 1. There is a cluster of dystrophic calcifications present anterior laterally within the left cerebellar hemisphere. Recommend correlation with an MRI of the brain without and with gadolinium contrast to exclude neoplasm. 2. Interval near total resolution of intraventricular hemorrhage and subarachnoid hemorrhage following removal of a right sided ventriculostomy catheter. There is mild residual hemorrhage along the tract. Electronically Signed   By: Evalene Coho M.D.   On: 12/06/2023 09:54   VAS US  TRANSCRANIAL DOPPLER Result Date: 12/03/2023  Transcranial Doppler Patient Name:  LAWSON ISABELL  Date of Exam:   12/02/2023 Medical Rec #: [id number]   Accession #:    [id number] Date of Birth: 1961/04/23   Patient Gender: F Patient Age:   46 years Exam Location:  [facility] Procedure:      VAS US  TRANSCRANIAL DOPPLER Referring Phys: TORIBIO SHARPS  --------------------------------------------------------------------------------  Indications: Subarachnoid hemorrhage. History: Large subarachnoid hemorrhage. Limitations: Movement Comparison Study: Prior TCD done 11/30/2023 Performing Technologist: Alberta Lis RVS  Examination Guidelines: A complete evaluation includes B-mode imaging, spectral Doppler, color Doppler, and power Doppler as needed of all accessible portions of each vessel. Bilateral testing is considered an integral part of a complete examination. Limited examinations for reoccurring indications may be performed as noted.  +----------+---------------+----------+-----------+-------+ RIGHT TCD Right VM (cm/s)Depth (cm)PulsatilityComment +----------+---------------+----------+-----------+-------+ MCA             116                   1.46            +----------+---------------+----------+-----------+-------+ ACA            -112                   1.02            +----------+---------------+----------+-----------+-------+ Term ICA        65                    1.33            +----------+---------------+----------+-----------+-------+ PCA P1          -34                   1.67            +----------+---------------+----------+-----------+-------+ Opthalmic       27                    1.83            +----------+---------------+----------+-----------+-------+ ICA siphon      58                    1.15            +----------+---------------+----------+-----------+-------+ Vertebral                                             +----------+---------------+----------+-----------+-------+  +----------+--------------+----------+-----------+-------+ LEFT TCD  Left VM (cm/s)Depth (cm)PulsatilityComment +----------+--------------+----------+-----------+-------+ MCA            120                   1.40            +----------+--------------+----------+-----------+-------+ ACA            -  22                    1.43            +----------+--------------+----------+-----------+-------+ Term ICA        41                   1.71            +----------+--------------+----------+-----------+-------+ PCA P1          58                   1.34            +----------+--------------+----------+-----------+-------+ Opthalmic       20                   1.81            +----------+--------------+----------+-----------+-------+ ICA siphon      38                   1.19            +----------+--------------+----------+-----------+-------+ Vertebral                                            +----------+--------------+----------+-----------+-------+ Distal ICA     -47                   1.71            +----------+--------------+----------+-----------+-------+  +------------+-------+-------+             VM cm/sComment +------------+-------+-------+ Prox Basilar  -44          +------------+-------+-------+ +---------------------+----+ Left Lindegaard Ratio2.55 +---------------------+----+  Summary:  Elevated bilateral middle crebral and right anterior cerebral artery mean flow velocities suggest mioderate stenosis.Globally elevated pulsatility indices suggest diffuse increase in intracranial pressure or generalized atherosclerosis *See table(s) above for TCD measurements and observations.  Diagnosing physician: Eather Popp MD Electronically signed by Eather Popp MD on 12/03/2023 at 11:13:07 AM.    Final    CT HEAD WO CONTRAST ( ) Result Date: 12/03/2023 EXAM: CT HEAD WITHOUT CONTRAST 12/03/2023 05:53:00 AM TECHNIQUE: CT of the head was performed without the administration of intravenous contrast. Automated exposure control, iterative reconstruction, and/or weight based adjustment of the mA/kV was utilized to reduce the radiation dose to as low as reasonably achievable. COMPARISON: CT head without contrast 11/28/2023 CLINICAL HISTORY: Hydrocephalus. Chief complaints; Headache.  FINDINGS: BRAIN AND VENTRICLES: Blood products along the right frontal ventriculostomy are stable. Mild subcortical edema in the high right frontal lobe adjacent to the ventriculostomy catheter is also stable. Subarachnoid blood over the convexities bilaterally, left greater than right, is improving. Minimal intraventricular hemorrhage bilaterally is stable to slightly decreased. No hydrocephalus is present. ORBITS: No acute abnormality. SINUSES: No acute abnormality. SOFT TISSUES AND SKULL: No acute soft tissue abnormality. No skull fracture. IMPRESSION: 1. Improving subarachnoid blood over the convexities bilaterally, left greater than right. 2. Stable to slightly decreased minimal intraventricular hemorrhage bilaterally. 3. No hydrocephalus. 4. Stable blood products along the right frontal ventriculostomy and mild subcortical edema in the high right frontal lobe adjacent to the ventriculostomy catheter. 5. No new hemorrhage or focal infarct. Electronically signed by: Lonni Necessary MD 12/03/2023 06:16 AM EDT RP Workstation: [id number]   VAS US  TRANSCRANIAL DOPPLER Result Date: 12/01/2023  Transcranial Doppler Patient  Name:  [name] HAMMERSMITH  Date of Exam:   11/30/2023 Medical Rec #: [id number]   Accession #:    [id number] Date of Birth: 10/03/1960   Patient Gender: F Patient Age:   36 years Exam Location:  [facility] Procedure:      VAS US  TRANSCRANIAL DOPPLER Referring Phys: TORIBIO SHARPS --------------------------------------------------------------------------------  Indications: Subarachnoid hemorrhage. History: Right frontal ventricular catheter placement (11/20/2023). Replacement of right frontal ventricular catheter (11/28/2023).  Performing Technologist: Ricka Sturdivant-Jones RDMS, RVT  Examination Guidelines: A complete evaluation includes B-mode imaging, spectral Doppler, color Doppler, and power Doppler as needed of all accessible portions of each vessel. Bilateral testing is considered an  integral part of a complete examination. Limited examinations for reoccurring indications may be performed as noted.  +----------+---------------+----------+-----------+-------+ RIGHT TCD Right VM (cm/s)Depth (cm)PulsatilityComment +----------+---------------+----------+-----------+-------+ MCA             149         4.50      1.11            +----------+---------------+----------+-----------+-------+ ACA             -94                   1.07            +----------+---------------+----------+-----------+-------+ PCA P1          52                    1.18            +----------+---------------+----------+-----------+-------+ Opthalmic       28                    1.85            +----------+---------------+----------+-----------+-------+ ICA siphon      66                    1.22            +----------+---------------+----------+-----------+-------+ Vertebral       -56                   1.19            +----------+---------------+----------+-----------+-------+ Distal ICA      42                    1.35            +----------+---------------+----------+-----------+-------+  +----------+--------------+----------+-----------+-------+ LEFT TCD  Left VM (cm/s)Depth (cm)PulsatilityComment +----------+--------------+----------+-----------+-------+ MCA            118         4.80      1.27            +----------+--------------+----------+-----------+-------+ ACA            -77                   1.13            +----------+--------------+----------+-----------+-------+ Term ICA        42                   1.10            +----------+--------------+----------+-----------+-------+ PCA P1          64                   1.32            +----------+--------------+----------+-----------+-------+  Opthalmic       20                   1.23            +----------+--------------+----------+-----------+-------+ ICA siphon      61                    1.17            +----------+--------------+----------+-----------+-------+ Vertebral      -94                   1.29            +----------+--------------+----------+-----------+-------+ Distal ICA      33                   1.28            +----------+--------------+----------+-----------+-------+  +------------+-------+-------+             VM cm/sComment +------------+-------+-------+ Prox Basilar  -38          +------------+-------+-------+ Dist Basilar  -46          +------------+-------+-------+ +----------------------+---+ Right Lindegaard Ratio3.5 +----------------------+---+ +---------------------+---+ Left Lindegaard Ratio3.5 +---------------------+---+  Summary:  Eleveated mean flow velocities suggestive of moderate right middle cerebral and mild right anterior cerebral and left middle cerebral artery vasospasm.Globally elevate dpulsatility indices suggest increase intracranial pressure likely *See table(s) above for TCD measurements and observations.  Diagnosing physician: Eather Popp MD Electronically signed by Eather Popp MD on 12/01/2023 at 8:19:04 AM.    Final    IR US  Guide Vasc Access Right PROCEDURE: Diagnostic Cerebral Angiogram  SURGEON:  Dr. Gerldine Maizes, MD  HISTORY:  The patient is a 62 y.o. yo female admitted to the hospital with sudden onset of headache and initial CT scan demonstrating diffuse subarachnoid hemorrhage.  Her initial CT angiogram was negative.  We attempted diagnostic cerebral angiogram upon admission which was aborted as the patient was somewhat agitated and removed her femoral sheath mid procedure.  She has been monitored in the intensive care unit with largely stable neurologic condition.  She presents today for follow-up diagnostic cerebral angiogram.  APPROACH:  The technical aspects of the procedure as well as its potential risks and benefits were reviewed with the patient and her family. These risks included but were not limited  bleeding, infection, allergic reaction, damage to organs/vital structures, stroke, non-diagnostic procedure, and the catastrophic outcomes of heart attack, coma, and death. With an understanding of these risks, informed consent was obtained and witnessed.   The patient was placed in the supine position on the angiography table and the skin of right groin prepped in the usual sterile fashion. The procedure was performed under local anesthesia (1%-solution of bicarbonate-bufferred Lidoacaine) and conscious sedation with Versed  and fentanyl  monitored by the in-suite nurse and myself, including non-invasive blood pressure and continuous pulse oxymetry.   Access to the right common femoral artery was obtained under ultrasound guidance using a micropuncture needle.  This allowed direct visualization of the micropuncture needle into the lumen of the right common femoral artery.  A short 5 French sheath was placed using standard Seldinger technique.  HEPARIN : 0 Units total.   CONTRAST AGENT: See IR records  FLUOROSCOPY TIME: See IR records   CATHETER(S) AND WIRE(S):   5-French JB-1 glidecatheter  0.035 glidewire   VESSELS CATHETERIZED:  Right internal carotid  Left internal carotid  Right vertebral  Left vertebral  Right common  femoral  VESSELS STUDIED:  Right internal carotid, head Left internal carotid, head Left vertebral Right vertebral Right femoral  PROCEDURAL NARRATIVE:  A 5-Fr JB-1 terumo glide catheter was advanced over a 0.035 glidewire into the aortic arch. The above vessels were then sequentially catheterized and cervical/cerebral angiograms taken. After review of images, the catheter was removed without incident.   INTERPRETATION:  Right internal carotid, head:  Injection reveals the presence of a widely patent ICA, M1, and A1 segments and their branches. No aneurysms, arteriovenous malformations, or high flow fistulas are visualized.  There is mild to moderate vasospasm involving primarily the proximal  segments of the middle and anterior cerebral arteries.  There is no flow mentation.  The parenchymal and venous phases are unremarkable. The venous sinuses are widely patent.   Left internal carotid, head:  Injection reveals the presence of a widely patent ICA, A1, and M1 segments and their branches. No aneurysms, arteriovenous malformations, or high flow fistulas are visualized.  Similar to the contralateral side, there is mild to moderate vasospasm involving the proximal segments of the middle cerebral artery.  There is no flow limitation.  The parenchymal and venous phases are unremarkable. The venous sinuses are widely patent.   Right vertebral:  Injection reveals the presence of a widely patent vertebral artery. This leads to a widely patent basilar artery that terminates in left P1, while the right P1 is largely hypoplastic. The basilar apex is normal. No aneurysms, arteriovenous malformations, or high flow fistulas are visualized.  No significant vasospasm of the posterior circulation is noted.  The parenchymal and venous phases are normal. The venous sinuses are patent.   Right vertebral:   Normal vessel. No PICA aneurysm. See basilar description above.   Right femoral:   Normal vessel. No significant atherosclerotic disease. Arterial sheath in adequate position.  DISPOSITION: Upon completion of the study, the femoral sheath was removed and hemostasis obtained using a 5-Fr Exoseal closure device. Good proximal and distal lower extremity pulses were documented upon achievement of hemostasis. The procedure was well tolerated and no early complications were observed.  Patient was then transferred back to the neurointensive care unit for further care.  IMPRESSION: 1.  No intracranial aneurysms, arteriovenous malformations, or high flow fistulas are identified on this follow-up diagnostic cerebral angiogram as a source of subarachnoid hemorrhage. 2.  There is mild to moderate vasospasm involving the proximal  segments of the anterior and middle cerebral arteries bilaterally, without any flow limitation.    Gerldine Maizes, MD [facility]  Electronically signed by Maizes Gerldine, MD at 11/29/2023 11:44 AM   IR ANGIO VERTEBRAL SEL VERTEBRAL BILAT MOD SED PROCEDURE: Diagnostic Cerebral Angiogram  SURGEON:  Dr. Gerldine Maizes, MD  HISTORY:  The patient is a 63 y.o. yo female admitted to the hospital with sudden onset of headache and initial CT scan demonstrating diffuse subarachnoid hemorrhage.  Her initial CT angiogram was negative.  We attempted diagnostic cerebral angiogram upon admission which was aborted as the patient was somewhat agitated and removed her femoral sheath mid procedure.  She has been monitored in the intensive care unit with largely stable neurologic condition.  She presents today for follow-up diagnostic cerebral angiogram.  APPROACH:  The technical aspects of the procedure as well as its potential risks and benefits were reviewed with the patient and her family. These risks included but were not limited bleeding, infection, allergic reaction, damage to organs/vital structures, stroke, non-diagnostic procedure, and the catastrophic outcomes of heart  attack, coma, and death. With an understanding of these risks, informed consent was obtained and witnessed.   The patient was placed in the supine position on the angiography table and the skin of right groin prepped in the usual sterile fashion. The procedure was performed under local anesthesia (1%-solution of bicarbonate-bufferred Lidoacaine) and conscious sedation with Versed  and fentanyl  monitored by the in-suite nurse and myself, including non-invasive blood pressure and continuous pulse oxymetry.   Access to the right common femoral artery was obtained under ultrasound guidance using a micropuncture needle.  This allowed direct visualization of the micropuncture needle into the lumen of the right common  femoral artery.  A short 5 French sheath was placed using standard Seldinger technique.  HEPARIN : 0 Units total.   CONTRAST AGENT: See IR records  FLUOROSCOPY TIME: See IR records   CATHETER(S) AND WIRE(S):   5-French JB-1 glidecatheter  0.035 glidewire   VESSELS CATHETERIZED:  Right internal carotid  Left internal carotid  Right vertebral  Left vertebral  Right common femoral  VESSELS STUDIED:  Right internal carotid, head Left internal carotid, head Left vertebral Right vertebral Right femoral  PROCEDURAL NARRATIVE:  A 5-Fr JB-1 terumo glide catheter was advanced over a 0.035 glidewire into the aortic arch. The above vessels were then sequentially catheterized and cervical/cerebral angiograms taken. After review of images, the catheter was removed without incident.   INTERPRETATION:  Right internal carotid, head:  Injection reveals the presence of a widely patent ICA, M1, and A1 segments and their branches. No aneurysms, arteriovenous malformations, or high flow fistulas are visualized.  There is mild to moderate vasospasm involving primarily the proximal segments of the middle and anterior cerebral arteries.  There is no flow mentation.  The parenchymal and venous phases are unremarkable. The venous sinuses are widely patent.   Left internal carotid, head:  Injection reveals the presence of a widely patent ICA, A1, and M1 segments and their branches. No aneurysms, arteriovenous malformations, or high flow fistulas are visualized.  Similar to the contralateral side, there is mild to moderate vasospasm involving the proximal segments of the middle cerebral artery.  There is no flow limitation.  The parenchymal and venous phases are unremarkable. The venous sinuses are widely patent.   Right vertebral:  Injection reveals the presence of a widely patent vertebral artery. This leads to a widely patent basilar artery that terminates in left P1, while the right P1 is largely hypoplastic. The basilar apex is normal. No  aneurysms, arteriovenous malformations, or high flow fistulas are visualized.  No significant vasospasm of the posterior circulation is noted.  The parenchymal and venous phases are normal. The venous sinuses are patent.   Right vertebral:   Normal vessel. No PICA aneurysm. See basilar description above.   Right femoral:   Normal vessel. No significant atherosclerotic disease. Arterial sheath in adequate position.  DISPOSITION: Upon completion of the study, the femoral sheath was removed and hemostasis obtained using a 5-Fr Exoseal closure device. Good proximal and distal lower extremity pulses were documented upon achievement of hemostasis. The procedure was well tolerated and no early complications were observed.  Patient was then transferred back to the neurointensive care unit for further care.  IMPRESSION: 1.  No intracranial aneurysms, arteriovenous malformations, or high flow fistulas are identified on this follow-up diagnostic cerebral angiogram as a source of subarachnoid hemorrhage. 2.  There is mild to moderate vasospasm involving the proximal segments of the anterior and middle cerebral arteries bilaterally, without any flow limitation.  Gerldine Maizes, MD [facility]  Electronically signed by Maizes Gerldine, MD at 11/29/2023 11:44 AM   IR ANGIO INTRA EXTRACRAN SEL INTERNAL CAROTID BILAT MOD SED PROCEDURE: Diagnostic Cerebral Angiogram  SURGEON:  Dr. Gerldine Maizes, MD  HISTORY:  The patient is a 63 y.o. yo female admitted to the hospital with sudden onset of headache and initial CT scan demonstrating diffuse subarachnoid hemorrhage.  Her initial CT angiogram was negative.  We attempted diagnostic cerebral angiogram upon admission which was aborted as the patient was somewhat agitated and removed her femoral sheath mid procedure.  She has been monitored in the intensive care unit with largely stable neurologic condition.  She presents today for follow-up  diagnostic cerebral angiogram.  APPROACH:  The technical aspects of the procedure as well as its potential risks and benefits were reviewed with the patient and her family. These risks included but were not limited bleeding, infection, allergic reaction, damage to organs/vital structures, stroke, non-diagnostic procedure, and the catastrophic outcomes of heart attack, coma, and death. With an understanding of these risks, informed consent was obtained and witnessed.   The patient was placed in the supine position on the angiography table and the skin of right groin prepped in the usual sterile fashion. The procedure was performed under local anesthesia (1%-solution of bicarbonate-bufferred Lidoacaine) and conscious sedation with Versed  and fentanyl  monitored by the in-suite nurse and myself, including non-invasive blood pressure and continuous pulse oxymetry.   Access to the right common femoral artery was obtained under ultrasound guidance using a micropuncture needle.  This allowed direct visualization of the micropuncture needle into the lumen of the right common femoral artery.  A short 5 French sheath was placed using standard Seldinger technique.  HEPARIN : 0 Units total.   CONTRAST AGENT: See IR records  FLUOROSCOPY TIME: See IR records   CATHETER(S) AND WIRE(S):   5-French JB-1 glidecatheter  0.035 glidewire   VESSELS CATHETERIZED:  Right internal carotid  Left internal carotid  Right vertebral  Left vertebral  Right common femoral  VESSELS STUDIED:  Right internal carotid, head Left internal carotid, head Left vertebral Right vertebral Right femoral  PROCEDURAL NARRATIVE:  A 5-Fr JB-1 terumo glide catheter was advanced over a 0.035 glidewire into the aortic arch. The above vessels were then sequentially catheterized and cervical/cerebral angiograms taken. After review of images, the catheter was removed without incident.   INTERPRETATION:  Right internal carotid, head:  Injection reveals the presence of a  widely patent ICA, M1, and A1 segments and their branches. No aneurysms, arteriovenous malformations, or high flow fistulas are visualized.  There is mild to moderate vasospasm involving primarily the proximal segments of the middle and anterior cerebral arteries.  There is no flow mentation.  The parenchymal and venous phases are unremarkable. The venous sinuses are widely patent.   Left internal carotid, head:  Injection reveals the presence of a widely patent ICA, A1, and M1 segments and their branches. No aneurysms, arteriovenous malformations, or high flow fistulas are visualized.  Similar to the contralateral side, there is mild to moderate vasospasm involving the proximal segments of the middle cerebral artery.  There is no flow limitation.  The parenchymal and venous phases are unremarkable. The venous sinuses are widely patent.   Right vertebral:  Injection reveals the presence of a widely patent vertebral artery. This leads to a widely patent basilar artery that terminates in left P1, while the right P1 is largely hypoplastic. The basilar apex is normal. No  aneurysms, arteriovenous malformations, or high flow fistulas are visualized.  No significant vasospasm of the posterior circulation is noted.  The parenchymal and venous phases are normal. The venous sinuses are patent.   Right vertebral:   Normal vessel. No PICA aneurysm. See basilar description above.   Right femoral:   Normal vessel. No significant atherosclerotic disease. Arterial sheath in adequate position.  DISPOSITION: Upon completion of the study, the femoral sheath was removed and hemostasis obtained using a 5-Fr Exoseal closure device. Good proximal and distal lower extremity pulses were documented upon achievement of hemostasis. The procedure was well tolerated and no early complications were observed.  Patient was then transferred back to the neurointensive care unit for further care.  IMPRESSION: 1.  No intracranial aneurysms,  arteriovenous malformations, or high flow fistulas are identified on this follow-up diagnostic cerebral angiogram as a source of subarachnoid hemorrhage. 2.  There is mild to moderate vasospasm involving the proximal segments of the anterior and middle cerebral arteries bilaterally, without any flow limitation.    Gerldine Maizes, MD [facility]  Electronically signed by Maizes Gerldine, MD at 11/29/2023 11:44 AM   VAS US  TRANSCRANIAL DOPPLER Result Date: 11/28/2023  Transcranial Doppler Patient Name:  JAZIYAH GRADEL  Date of Exam:   11/28/2023 Medical Rec #: [id number]   Accession #:    [id number] Date of Birth: July 15, 1960   Patient Gender: F Patient Age:   29 years Exam Location:  [facility] Procedure:      VAS US  TRANSCRANIAL DOPPLER Referring Phys: TORIBIO SHARPS --------------------------------------------------------------------------------  Indications: Subarachnoid hemorrhage. History: Right frontal ventricular catheter placement (11/20/2023). Replacement of right frontal ventricular catheter (11/28/2023). Comparison Study: Previous exam was on 11/25/2023 Performing Technologist: Ezzie Potters RVT, RDMS  Examination Guidelines: A complete evaluation includes B-mode imaging, spectral Doppler, color Doppler, and power Doppler as needed of all accessible portions of each vessel. Bilateral testing is considered an integral part of a complete examination. Limited examinations for reoccurring indications may be performed as noted.  +----------+---------------+----------+-----------+-------+ RIGHT TCD Right VM (cm/s)Depth (cm)PulsatilityComment +----------+---------------+----------+-----------+-------+ MCA             132         5.30      1.23            +----------+---------------+----------+-----------+-------+ ACA             -31                   1.24            +----------+---------------+----------+-----------+-------+ Term ICA        36                     1.36            +----------+---------------+----------+-----------+-------+ PCA P1          39                    1.48            +----------+---------------+----------+-----------+-------+ Opthalmic       23                    2.11            +----------+---------------+----------+-----------+-------+ ICA siphon      54                    1.07            +----------+---------------+----------+-----------+-------+  Vertebral       -63         6.80      1.22            +----------+---------------+----------+-----------+-------+ Distal ICA      28                    0.85            +----------+---------------+----------+-----------+-------+  +----------+--------------+----------+-----------+-------+ LEFT TCD  Left VM (cm/s)Depth (cm)PulsatilityComment +----------+--------------+----------+-----------+-------+ MCA            137         5.20      1.32            +----------+--------------+----------+-----------+-------+ ACA            -20                   1.31            +----------+--------------+----------+-----------+-------+ Term ICA        52                   1.15            +----------+--------------+----------+-----------+-------+ PCA P1          54                   1.08            +----------+--------------+----------+-----------+-------+ Opthalmic       17                   1.89            +----------+--------------+----------+-----------+-------+ ICA siphon      34                   1.06            +----------+--------------+----------+-----------+-------+ Vertebral      -81         7.00      1.26            +----------+--------------+----------+-----------+-------+ Distal ICA      26                   1.41            +----------+--------------+----------+-----------+-------+  +------------+-------+-------+             VM cm/sComment +------------+-------+-------+ Prox Basilar  -41   0.95    +------------+-------+-------+ Dist Basilar  -59   1.09   +------------+-------+-------+ +----------------------+----+ Right Lindegaard Ratio4.71 +----------------------+----+ +---------------------+----+ Left Lindegaard Ratio5.27 +---------------------+----+  Summary:  Elevated mean velocity and Lindegaard Ratio noted bilaterally, indicating moderate vasospasm of both middle cerebral arteries. *See table(s) above for TCD measurements and observations.  Diagnosing physician: Eather Popp MD Electronically signed by Eather Popp MD on 11/28/2023 at 5:24:54 PM.    Final    CT HEAD WO CONTRAST ( ) Result Date: 11/28/2023 EXAM: CT HEAD WITHOUT CONTRAST 11/28/2023 03:11:00 AM TECHNIQUE: CT of the head was performed without the administration of intravenous contrast. Automated exposure control, iterative reconstruction, and/or weight based adjustment of the mA/kV was utilized to reduce the radiation dose to as low as reasonably achievable. COMPARISON: 11/25/2023 CLINICAL HISTORY: Stroke, follow up. Headache. FINDINGS: BRAIN AND VENTRICLES: Unchanged small volume subarachnoid hemorrhage over both hemispheres. Unchanged small intraparenchymal hematoma along the proximal course of the shunt catheter, which terminates at the right foramen of Monro. Small amount of  blood layering within the occipital horns of the lateral ventricles, unchanged. The size and configuration of the lateral ventricles is unchanged. ORBITS: No acute abnormality. SINUSES: Right maxillary sinus opacification. SOFT TISSUES AND SKULL: No acute soft tissue abnormality. No skull fracture. IMPRESSION: 1. Unchanged small volume subarachnoid hemorrhage over both hemispheres. 2. Unchanged small intraparenchymal hematoma along the proximal course of the shunt catheter, which terminates at the right foramen of [place]. 3. Small amount of blood layering within the occipital horns of the lateral ventricles, unchanged. 4. Unchanged size and  configuration of the lateral ventricles. Electronically signed by: Franky Stanford MD 11/28/2023 03:21 AM EDT RP Workstation: [id number]   DG CHEST PORT 1 VIEW Result Date: 11/26/2023 CLINICAL DATA:  Fever.  Subarachnoid hemorrhage EXAM: PORTABLE CHEST 1 VIEW COMPARISON:  Chest x-ray 11/21/2023. FINDINGS: Enteric tube with tip extending beneath the diaphragm. Overlapping cardiac leads. No pneumothorax or edema. No effusion. Normal cardiopericardial silhouette. Mild bandlike changes along the lung bases. Atelectasis is favored over infiltrate but recommend follow-up. IMPRESSION: Bandlike opacities along the lung bases. Favor atelectasis over infiltrate but recommend follow-up. Enteric tube. Electronically Signed   By: Ranell Bring M.D.   On: 11/26/2023 18:56   EEG adult Result Date: 11/26/2023 Michaela Aisha SQUIBB, MD     11/26/2023  3:11 PM History: 63 yo F with lethargy in the setting of SAH, evaluate for evidence of seizure EEG Duration: 22 minutes Sedation: none Patient State: Awake and asleep Technique: This EEG was acquired with electrodes placed according to the International 10-20 electrode system (including Fp1, Fp2, F3, F4, C3, C4, P3, P4, O1, O2, T3, T4, T5, T6, A1, A2, Fz, Cz, Pz). The following electrodes were missing or displaced: none. Background:  There is a posterior dominant rhythm of 8 to 9 Hz which is seen at times.  There is also irregular delta and theta activity intruding into the background.  With drowsiness there is an increase in slow activity and the patient does have symmetric appearing sleep structure seen during the recording. Photic stimulation: Physiologic driving is not performed EEG Abnormalities: 1) generalized irregular slow activity Clinical Interpretation: This EEG is consistent with a mild generalized nonspecific cerebral dysfunction (encephalopathy). There was no seizure or seizure predisposition recorded on this study. Please note that lack of epileptiform activity on EEG  does not preclude the possibility of epilepsy. Aisha Michaela, MD [facility] If 7pm- 7am, please page neurology on call as listed in AMION.  CT HEAD WO CONTRAST ( ) Result Date: 11/25/2023 EXAM: CT HEAD WITHOUT CONTRAST 11/25/2023 04:29:05 PM TECHNIQUE: CT of the head was performed without the administration of intravenous contrast. Automated exposure control, iterative reconstruction, and/or weight based adjustment of the mA/kV was utilized to reduce the radiation dose to as low as reasonably achievable. COMPARISON: CT head 11/20/2023. CLINICAL HISTORY: Headache, increasing frequency or severity. FINDINGS: BRAIN AND VENTRICLES: Interval placement of a right frontal approach ventriculostomy and catheter with tip terminating in the third ventricle. Small amount of parenchymal hemorrhage along the ventriculostomy catheter tract. Unchanged mild dilation of the lateral ventricles. Interval redistribution of subarachnoid hemorrhage along the cerebral convexities and associated slight increase in intraventricular hemorrhage. ORBITS: No acute abnormality. SINUSES: No acute abnormality. SOFT TISSUES AND SKULL: No acute soft tissue abnormality. No skull fracture. IMPRESSION: 1. Interval placement of a right frontal approach ventriculostomy and catheter with tip terminating in the third ventricle. Small amount of parenchymal hemorrhage along the ventriculostomy catheter tract. 2. Unchanged mild dilation of the lateral ventricles. 3. Interval redistribution  of subarachnoid hemorrhage along the cerebral convexities and associated slight increase in intraventricular hemorrhage. Electronically signed by: Ryan Chess MD 11/25/2023 04:41 PM EDT RP Workstation: [id number]   VAS US  TRANSCRANIAL DOPPLER Result Date: 11/25/2023  Transcranial Doppler Patient Name:  WAI MINOTTI  Date of Exam:   11/25/2023 Medical Rec #: [id number]   Accession #:    [id number] Date of Birth: [birth year]   Patient Gender: F Patient  Age:   30 years Exam Location:  [facility] Procedure:      VAS US  TRANSCRANIAL DOPPLER Referring Phys: TORIBIO SHARPS --------------------------------------------------------------------------------  Indications: Subarachnoid hemorrhage. History: Right frontal ventricular catheter placement (11/20/2023). Comparison Study: Previous exam on 11/23/2023 Performing Technologist: Ezzie Potters RVT, RDMS  Examination Guidelines: A complete evaluation includes B-mode imaging, spectral Doppler, color Doppler, and power Doppler as needed of all accessible portions of each vessel. Bilateral testing is considered an integral part of a complete examination. Limited examinations for reoccurring indications may be performed as noted.  +----------+---------------+----------+-----------+--------+ RIGHT TCD Right VM (cm/s)Depth (cm)PulsatilityComment  +----------+---------------+----------+-----------+--------+ MCA             70          5.20      1.30    130 cm/s +----------+---------------+----------+-----------+--------+ ACA             -38                   1.14             +----------+---------------+----------+-----------+--------+ Term ICA        48                    1.31             +----------+---------------+----------+-----------+--------+ PCA P1          35                    1.33             +----------+---------------+----------+-----------+--------+ Opthalmic       15                    2.04             +----------+---------------+----------+-----------+--------+ ICA siphon      38                    1.14             +----------+---------------+----------+-----------+--------+ Vertebral       -33                   0.98             +----------+---------------+----------+-----------+--------+ Distal ICA      36                    1.22             +----------+---------------+----------+-----------+--------+  +----------+--------------+----------+-----------+-------+  LEFT TCD  Left VM (cm/s)Depth (cm)PulsatilityComment +----------+--------------+----------+-----------+-------+ MCA             63                   1.21            +----------+--------------+----------+-----------+-------+ ACA            -34                   1.17            +----------+--------------+----------+-----------+-------+  Term ICA        45                   1.42            +----------+--------------+----------+-----------+-------+ PCA P1          30                   1.22            +----------+--------------+----------+-----------+-------+ Opthalmic       14                   2.17            +----------+--------------+----------+-----------+-------+ ICA siphon      33                   1.11            +----------+--------------+----------+-----------+-------+ Vertebral      -34                   1.25            +----------+--------------+----------+-----------+-------+ Distal ICA      25                   1.14            +----------+--------------+----------+-----------+-------+  +------------+-------+-------+             VM cm/sComment +------------+-------+-------+ Prox Basilar  -41   0.98   +------------+-------+-------+ Dist Basilar  -52   0.96   +------------+-------+-------+ +----------------------+----+ Right Lindegaard Ratio1.94 +----------------------+----+ +---------------------+----+ Left Lindegaard Ratio2.52 +---------------------+----+  Summary: This was a normal transcranial Doppler study, with normal flow direction and velocity of all identified vessels of the anterior and posterior circulations, with no evidence of stenosis, vasospasm or occlusion. There was no evidence of intracranial disease.  *See table(s) above for TCD measurements and observations.  Diagnosing physician: Eather Popp MD Electronically signed by Eather Popp MD on 11/25/2023 at 1:18:57 PM.    Final    VAS US  TRANSCRANIAL DOPPLER Result Date:  11/25/2023  Transcranial Doppler Patient Name:  CAIRO LINGENFELTER  Date of Exam:   11/23/2023 Medical Rec #: [id number]   Accession #:    [id number] Date of Birth: 10-20-1960   Patient Gender: F Patient Age:   79 years Exam Location:  [facility] Procedure:      VAS US  TRANSCRANIAL DOPPLER Referring Phys: TORIBIO SHARPS --------------------------------------------------------------------------------  Indications: Subarachnoid hemorrhage. History: Right frontal ventricular catheter placement (11/20/2023). Comparison Study: Previous exam 11/21/2023 Performing Technologist: Ezzie Potters RVT, RDMS  Examination Guidelines: A complete evaluation includes B-mode imaging, spectral Doppler, color Doppler, and power Doppler as needed of all accessible portions of each vessel. Bilateral testing is considered an integral part of a complete examination. Limited examinations for reoccurring indications may be performed as noted.  +----------+---------------+----------+-----------+-------+ RIGHT TCD Right VM (cm/s)Depth (cm)PulsatilityComment +----------+---------------+----------+-----------+-------+ MCA             60                    0.92            +----------+---------------+----------+-----------+-------+ ACA             -18                   1.32            +----------+---------------+----------+-----------+-------+ Term ICA  44                    1.20            +----------+---------------+----------+-----------+-------+ PCA P1          37                    0.89            +----------+---------------+----------+-----------+-------+ Opthalmic       19                    1.85            +----------+---------------+----------+-----------+-------+ ICA siphon      20                    0.64            +----------+---------------+----------+-----------+-------+ Vertebral       -54                   0.96            +----------+---------------+----------+-----------+-------+ Distal  ICA      36                    1.12            +----------+---------------+----------+-----------+-------+  +----------+--------------+----------+-----------+-------+ LEFT TCD  Left VM (cm/s)Depth (cm)PulsatilityComment +----------+--------------+----------+-----------+-------+ MCA             62                   1.05            +----------+--------------+----------+-----------+-------+ ACA            -40                   1.00            +----------+--------------+----------+-----------+-------+ Term ICA        43                   0.93            +----------+--------------+----------+-----------+-------+ PCA P1          39                   0.88            +----------+--------------+----------+-----------+-------+ Opthalmic       14                   1.91            +----------+--------------+----------+-----------+-------+ ICA siphon      43                   0.93            +----------+--------------+----------+-----------+-------+ Vertebral      -67                   0.90            +----------+--------------+----------+-----------+-------+ Distal ICA      26                   1.26            +----------+--------------+----------+-----------+-------+  +------------+-------+-------+             VM cm/sComment +------------+-------+-------+ Prox Basilar  -43   0.84   +------------+-------+-------+  Dist Basilar  -51   0.83   +------------+-------+-------+ +----------------------+----+ Right Lindegaard Ratio1.67 +----------------------+----+ +---------------------+----+ Left Lindegaard Ratio2.38 +---------------------+----+  Summary: This was a normal transcranial Doppler study, with normal flow direction and velocity of all identified vessels of the anterior and posterior circulations, with no evidence of stenosis, vasospasm or occlusion. There was no evidence of intracranial disease. *See table(s) above for TCD measurements and  observations.  Diagnosing physician: Eather Popp MD Electronically signed by Eather Popp MD on 11/25/2023 at 1:17:19 PM.    Final    VAS US  TRANSCRANIAL DOPPLER Result Date: 11/25/2023  Transcranial Doppler Patient Name:  ANDJELA WICKES  Date of Exam:   11/21/2023 Medical Rec #: [id number]   Accession #:    [id number] Date of Birth: 08/06/1960   Patient Gender: F Patient Age:   19 years Exam Location:  [facility] Procedure:      VAS US  TRANSCRANIAL DOPPLER Referring Phys: TORIBIO SHARPS --------------------------------------------------------------------------------  Indications: Subarachnoid hemorrhage. Limitations: Patient immobility Comparison Study: No prior studies. Performing Technologist: Cordella Collet RVT  Examination Guidelines: A complete evaluation includes B-mode imaging, spectral Doppler, color Doppler, and power Doppler as needed of all accessible portions of each vessel. Bilateral testing is considered an integral part of a complete examination. Limited examinations for reoccurring indications may be performed as noted.  +----------+---------------+----------+-----------+------------------+ RIGHT TCD Right VM (cm/s)Depth (cm)Pulsatility     Comment       +----------+---------------+----------+-----------+------------------+ MCA             50                    1.24                       +----------+---------------+----------+-----------+------------------+ ACA                                           Unable to insonate +----------+---------------+----------+-----------+------------------+ Term ICA        25                    1.27                       +----------+---------------+----------+-----------+------------------+ PCA P1          25                    1.14                       +----------+---------------+----------+-----------+------------------+ Opthalmic       14                    1.57                        +----------+---------------+----------+-----------+------------------+ ICA siphon      22                    0.83                       +----------+---------------+----------+-----------+------------------+ Vertebral                                     Unable to  insonate +----------+---------------+----------+-----------+------------------+ Distal ICA      -15                   1.19                       +----------+---------------+----------+-----------+------------------+  +----------+--------------+----------+-----------+------------------+ LEFT TCD  Left VM (cm/s)Depth (cm)Pulsatility     Comment       +----------+--------------+----------+-----------+------------------+ MCA             51                   0.91                       +----------+--------------+----------+-----------+------------------+ ACA                                          Unable to insonate +----------+--------------+----------+-----------+------------------+ Term ICA                                     Unable to insonate +----------+--------------+----------+-----------+------------------+ PCA P1          22                   1.29                       +----------+--------------+----------+-----------+------------------+ Opthalmic       17                   1.71                       +----------+--------------+----------+-----------+------------------+ ICA siphon                                   Unable to insonate +----------+--------------+----------+-----------+------------------+ Vertebral                                    Unable to insonate +----------+--------------+----------+-----------+------------------+ Distal ICA     -17                   1.25                       +----------+--------------+----------+-----------+------------------+  +------------+-------+------------------+             VM cm/s     Comment        +------------+-------+------------------+ Prox Basilar       Unable to insonate +------------+-------+------------------+ Dist Basilar       Unable to insonate +------------+-------+------------------+ +----------------------+----+ Right Lindegaard Ratio3.33 +----------------------+----+ +---------------------+-+ Left Lindegaard Ratio3 +---------------------+-+  Summary:  Normal mean flow velocities in anterior cerebral circulation without any evidence of vasospasm. Poor suboccipital window limits evaluation of posterior circulation vessels. *See table(s) above for TCD measurements and observations.  Diagnosing physician: Electronically signed by Eather Popp MD on 11/25/2023 at 1:16:47 PM.    Final    IR ANGIO VERTEBRAL SEL VERTEBRAL UNI R MOD SED PROCEDURE: Diagnostic Cerebral Angiogram  SURGEON:  Dr. Gerldine Maizes, MD  HISTORY:  The patient is a 63 y.o. yo female admitted with  sudden onset severe headache, obtundation, and CT scan demonstrating diffuse subarachnoid hemorrhage.  CT angiogram was negative for intracranial aneurysm however given the degree of hemorrhage and presence of hydrocephalus, there is high suspicion for underlying vascular malformation/aneurysm.  She therefore presents for diagnostic cerebral angiogram.  APPROACH:  The technical aspects of the procedure as well as its potential risks and benefits were reviewed with the patient and her husband. These risks included but were not limited bleeding, infection, allergic reaction, damage to organs/vital structures, stroke, non-diagnostic procedure, and the catastrophic outcomes of heart attack, coma, and death. With an understanding of these risks, informed consent was obtained and witnessed.   The patient was placed in the supine position on the angiography table and the skin of right groin prepped in the usual sterile fashion. The procedure was performed under local anesthesia (1%-solution of bicarbonate-bufferred Lidoacaine)  and fentanyl  monitored by the in-suite nurse and myself, including non-invasive blood pressure and continuous pulse oxymetry.   Access to the right common femoral artery was obtained using standard Seldinger technique.  A short 5 French sheath was introduced and connected to continuous heparinized saline flush.  HEPARIN : 0 Units total.   CONTRAST AGENT: See IR records  FLUOROSCOPY TIME: See IR records   CATHETER(S) AND WIRE(S):   5-French JB-1 glidecatheter  0.035 glidewire   VESSELS CATHETERIZED:  Right vertebral  Right common femoral  VESSELS STUDIED:  Right vertebral Right femoral  PROCEDURAL NARRATIVE:  A 5-Fr JB-1 terumo glide catheter was advanced over a 0.035 glidewire into the aortic arch.  Initially the right vertebral artery was selectively catheterized and cerebral angiogram taken.  There was technical malfunction of the biplane unit.  The system was restarted.  During the reboot, the patient attempted to get up from the angiogram table and her right femoral sheath was inadvertently removed.  We therefore aborted the procedure at this point.  INTERPRETATION:  Right vertebral:  Injection reveals the presence of a widely patent vertebral artery. This leads to a widely patent basilar artery that terminates in bilateral P1. The basilar apex is normal. No aneurysms, arteriovenous malformations, or high flow fistulas are visualized.  There is no posterior circulation vasospasm seen.  The parenchymal and venous phases are normal. The venous sinuses are patent.   DISPOSITION: After the procedure was aborted and the patient's sheath was removed, hemostasis was secured with manual compression.  Good proximal and distal lower extremity pulses were documented upon achievement of hemostasis.  Patient was then transferred back to the intensive care unit for further care.  IMPRESSION: 1.  Prematurely aborted diagnostic cerebral angiogram, however no aneurysms, AVMs, or fistulas are seen on the sole right vertebral  artery injection.    Gerldine Maizes, MD [facility]  Electronically signed by Maizes Gerldine, MD at 11/22/2023 10:17 AM   EEG adult Result Date: 11/21/2023 Shelton Arlin KIDD, MD     11/21/2023 10:35 AM Patient Name: Ronalee Scheunemann MRN: [id number] Epilepsy Attending: Arlin KIDD Shelton Referring Physician/Provider: Rosemarie Eather RAMAN, MD Date: 11/21/2023 Duration: 26.33 mins Patient history: 63yo F with SAH, now with ams. EEG to evaluate for seizure Level of alertness:  comatose/ lethargic AEDs during EEG study: LEV, propofol  Technical aspects: This EEG study was done with scalp electrodes positioned according to the 10-20 International system of electrode placement. Electrical activity was reviewed with band pass filter of 1-70Hz , sensitivity of 7 uV/mm, display speed of 75mm/sec with a 60Hz  notched filter applied as appropriate. EEG data were recorded  continuously and digitally stored.  Video monitoring was available and reviewed as appropriate. Description: EEG showed continuous generalized 3 to 6 Hz theta-delta slowing with overriding 13 to 15 Hz beta activity distributed symmetrically and diffusely. Hyperventilation and photic stimulation were not performed.   ABNORMALITY - Continuous slow, generalized - Excessive beta, generalized IMPRESSION: This study is suggestive of moderate to severe diffuse encephalopathy, nonspecific etiology but likely related to sedation. No seizures or epileptiform discharges were seen throughout the recording. Arlin MALVA Krebs   DG Chest Port 1 View Result Date: 11/21/2023 EXAM: 1 VIEW XRAY OF THE CHEST 11/21/2023 06:06:16 AM COMPARISON: None available. CLINICAL HISTORY: History of ETT. ETT, SAH; ROVER. FINDINGS: LUNGS AND PLEURA: No focal pulmonary opacity. No pulmonary edema. No pleural effusion. No pneumothorax. HEART AND MEDIASTINUM: No acute abnormality of the cardiac and mediastinal silhouettes. BONES AND SOFT TISSUES: No acute osseous  abnormality. LINES AND TUBES: Endotracheal tube terminates 3 cm above the carina. Gastric tube courses along the inferior border of the film. IMPRESSION: 1. No acute cardiopulmonary pathology. 2. Endotracheal tube and gastric tube in appropriate position as described . Electronically signed by: Lonni Necessary MD 11/21/2023 06:10 AM EDT RP Workstation: [id number]   DG Abd 1 View Result Date: 11/20/2023 CLINICAL DATA:  Check gastric catheter placement EXAM: ABDOMEN - 1 VIEW COMPARISON:  None Available. FINDINGS: Gastric catheter extends into the stomach. No free air is seen. No obstructive changes are noted. IMPRESSION: Gastric catheter within the stomach. Electronically Signed   By: Oneil Devonshire M.D.   On: 11/20/2023 20:34   CT HEAD CODE STROKE WO CONTRAST Result Date: 11/20/2023 EXAM: CT HEAD WITHOUT CONTRAST 11/20/2023 04:14:00 PM TECHNIQUE: CT of the head was performed without the administration of intravenous contrast. Automated exposure control, iterative reconstruction, and/or weight based adjustment of the mA/kV was utilized to reduce the radiation dose to as low as reasonably achievable. COMPARISON: None available. CLINICAL HISTORY: Neuro deficit, acute, stroke suspected. Chief complaints; headache; CT HEAD CODE STROKE WO CONTRAST; Neuro deficit, acute, stroke suspected; Deficits- right side weakness, non verbal FINDINGS: BRAIN AND VENTRICLES: Diffuse basilar subarachnoid hemorrhage with intraventricular extension and early ventriculomegaly. The hemorrhage is greatest along the left cerebellopontine angle cistern possibly indicating the site of origin. Attention on follow up CTA. ORBITS: No acute abnormality. SINUSES: No acute abnormality. SOFT TISSUES AND SKULL: No acute soft tissue abnormality. No skull fracture. IMPRESSION: 1. Diffuse basilar subarachnoid hemorrhage with intraventricular extension and early ventriculomegaly, greatest along the left cerebellopontine angle cistern, possibly  indicating the site of origin. Attention on follow-up CTA. Code stroke results were communicated to Dr. Matthews at 4:15 pm on 11/20/2023 by phone. Electronically signed by: Ryan Chess MD 11/20/2023 04:35 PM EDT RP Workstation: [id number]   CT ANGIO HEAD NECK W WO CM (CODE STROKE) Result Date: 11/20/2023 EXAM: CTA HEAD AND NECK WITH AND WITHOUT 11/20/2023 04:15:00 PM TECHNIQUE: CTA of the head and neck was performed with and without the administration of intravenous contrast. Multiplanar 2D and/or 3D reformatted images are provided for review. Automated exposure control, iterative reconstruction, and/or weight based adjustment of the mA/kV was utilized to reduce the radiation dose to as low as reasonably achievable. Stenosis of the internal carotid arteries measured using NASCET criteria. COMPARISON: None available CLINICAL HISTORY: Neuro deficit, acute, stroke suspected. Chief complaints; headache; CT ANGIO HEAD NECK W WO CM (CODE STROKE); Neuro deficit, acute, stroke suspected. FINDINGS: CTA NECK: AORTIC ARCH AND ARCH VESSELS: No dissection or arterial injury. No significant stenosis of the brachiocephalic  or subclavian arteries. CERVICAL CAROTID ARTERIES: No dissection, arterial injury, or hemodynamically significant stenosis by NASCET criteria. Tortuous cervical ICAs. CERVICAL VERTEBRAL ARTERIES: Abrupt tapering of the V4 segment of both vertebral arteries, concerning for early vasospasm, which could be masking an aneurysm. LUNGS AND MEDIASTINUM: Unremarkable. SOFT TISSUES: No acute abnormality. BONES: No acute abnormality. CTA HEAD: ANTERIOR CIRCULATION: No significant stenosis of the internal carotid arteries. No significant stenosis of the anterior cerebral arteries. No significant stenosis of the middle cerebral arteries. No aneurysm. POSTERIOR CIRCULATION: Persistent fetal origin of the right PCA. Abrupt tapering of the distal basilar artery, concerning for early vasospasm, which could be masking an  aneurysm. No significant stenosis of the posterior cerebral arteries. No significant stenosis of the basilar artery. No significant stenosis of the vertebral arteries. OTHER: No dural venous sinus thrombosis on this non-dedicated study. IMPRESSION: 1. No definite aneurysm or vascular malformation is identified. Abrupt tapering of the V4 segment of both vertebral arteries and the distal basilar artery, concerning for early vasospasm, which could be masking an aneurysm. Code stroke results were communicated to Dr. Matthews at 4:26 pm on 11/20/2023 by phone. Electronically signed by: Ryan Chess MD 11/20/2023 04:33 PM EDT RP Workstation: [id number]    Microbiology: Results for orders placed or performed during the hospital encounter of 11/20/23  MRSA Next Gen by PCR, Nasal     Status: None   Collection Time: 11/20/23  8:11 PM   Specimen: Nasal Mucosa; Nasal Swab  Result Value Ref Range Status   MRSA by PCR Next Gen NOT DETECTED NOT DETECTED Final    Comment: (NOTE) The GeneXpert MRSA Assay (FDA approved for NASAL specimens only), is one component of a comprehensive MRSA colonization surveillance program. It is not intended to diagnose MRSA infection nor to guide or monitor treatment for MRSA infections. Test performance is not FDA approved in patients less than 15 years old. Performed at [facility], [address], [place], [place] [address]   Urine Culture     Status: Abnormal   Collection Time: 11/25/23  9:03 AM   Specimen: Urine, Clean Catch  Result Value Ref Range Status   Specimen Description URINE, CLEAN CATCH  Final   Special Requests   Final    NONE Performed at [facility], [address], [place], [place] [address]    Culture >=100,000 COLONIES/mL ESCHERICHIA COLI (A)  Final   Report Status 11/28/2023 FINAL  Final   Organism ID, Bacteria ESCHERICHIA COLI (A)  Final      Susceptibility   Escherichia coli - MIC*    AMPICILLIN >=32 RESISTANT Resistant     CEFAZOLIN   <=4 SENSITIVE Sensitive     CEFEPIME  <=0.12 SENSITIVE Sensitive     CEFTRIAXONE <=0.25 SENSITIVE Sensitive     CIPROFLOXACIN <=0.25 SENSITIVE Sensitive     GENTAMICIN <=1 SENSITIVE Sensitive     IMIPENEM <=0.25 SENSITIVE Sensitive     NITROFURANTOIN <=16 SENSITIVE Sensitive     TRIMETH /SULFA  <=20 SENSITIVE Sensitive     AMPICILLIN/SULBACTAM 16 INTERMEDIATE Intermediate     PIP/TAZO <=4 SENSITIVE Sensitive ug/mL    * >=100,000 COLONIES/mL ESCHERICHIA COLI  Culture, blood (Routine X 2) w Reflex to ID Panel     Status: None   Collection Time: 11/26/23  5:03 PM   Specimen: BLOOD LEFT HAND  Result Value Ref Range Status   Specimen Description BLOOD LEFT HAND  Final   Special Requests   Final    BOTTLES DRAWN AEROBIC AND ANAEROBIC  Blood Culture results may not be optimal due to an inadequate volume of blood received in culture bottles   Culture   Final    NO GROWTH 5 DAYS Performed at [facility], [address] [place], [place] [address]    Report Status 12/01/2023 FINAL  Final  Culture, blood (Routine X 2) w Reflex to ID Panel     Status: None   Collection Time: 11/26/23  5:10 PM   Specimen: BLOOD LEFT ARM  Result Value Ref Range Status   Specimen Description BLOOD LEFT ARM  Final   Special Requests   Final    BOTTLES DRAWN AEROBIC AND ANAEROBIC Blood Culture results may not be optimal due to an inadequate volume of blood received in culture bottles   Culture   Final    NO GROWTH 5 DAYS Performed at [facility], [address] [place], [place] [address]    Report Status 12/01/2023 FINAL  Final  CSF culture w Gram Stain     Status: None   Collection Time: 11/27/23 10:01 AM   Specimen: CSF; Cerebrospinal Fluid  Result Value Ref Range Status   Specimen Description CSF  Final   Special Requests NONE  Final   Gram Stain NO WBC SEEN NO ORGANISMS SEEN   Final   Culture   Final    NO GROWTH 3 DAYS Performed at [facility], [address] [place], [place]  [address]    Report Status 11/30/2023 FINAL  Final  Culture, blood (Routine X 2) w Reflex to ID Panel     Status: None (Preliminary result)   Collection Time: 12/06/23 10:54 AM   Specimen: BLOOD RIGHT HAND  Result Value Ref Range Status   Specimen Description BLOOD RIGHT HAND  Final   Special Requests   Final    BOTTLES DRAWN AEROBIC ONLY Blood Culture results may not be optimal due to an inadequate volume of blood received in culture bottles   Culture  Setup Time   Final    GRAM POSITIVE COCCI IN CLUSTERS BOTTLES DRAWN AEROBIC ONLY CRITICAL RESULT CALLED TO, READ BACK BY AND VERIFIED WITH: MAYA NY A [id number] FCP Performed at [facility], [address] [place], [place] [address]    Culture GRAM POSITIVE COCCI  Final   Report Status PENDING  Incomplete  Blood Culture ID Panel (Reflexed)     Status: Abnormal   Collection Time: 12/06/23 10:54 AM  Result Value Ref Range Status   Enterococcus faecalis NOT DETECTED NOT DETECTED Final   Enterococcus Faecium NOT DETECTED NOT DETECTED Final   Listeria monocytogenes NOT DETECTED NOT DETECTED Final   Staphylococcus species DETECTED (A) NOT DETECTED Final    Comment: CRITICAL RESULT CALLED TO, READ BACK BY AND VERIFIED WITH: PHARMD GREG A [id number] FCP    Staphylococcus aureus (BCID) NOT DETECTED NOT DETECTED Final   Staphylococcus epidermidis NOT DETECTED NOT DETECTED Final   Staphylococcus lugdunensis NOT DETECTED NOT DETECTED Final   Streptococcus species NOT DETECTED NOT DETECTED Final   Streptococcus agalactiae NOT DETECTED NOT DETECTED Final   Streptococcus pneumoniae NOT DETECTED NOT DETECTED Final   Streptococcus pyogenes NOT DETECTED NOT DETECTED Final   A.calcoaceticus-baumannii NOT DETECTED NOT DETECTED Final   Bacteroides fragilis NOT DETECTED NOT DETECTED Final   Enterobacterales NOT DETECTED NOT DETECTED Final   Enterobacter cloacae complex NOT DETECTED NOT DETECTED Final   Escherichia coli NOT DETECTED NOT  DETECTED Final   Klebsiella  aerogenes NOT DETECTED NOT DETECTED Final   Klebsiella oxytoca NOT DETECTED NOT DETECTED Final   Klebsiella pneumoniae NOT DETECTED NOT DETECTED Final   Proteus species NOT DETECTED NOT DETECTED Final   Salmonella species NOT DETECTED NOT DETECTED Final   Serratia marcescens NOT DETECTED NOT DETECTED Final   Haemophilus influenzae NOT DETECTED NOT DETECTED Final   Neisseria meningitidis NOT DETECTED NOT DETECTED Final   Pseudomonas aeruginosa NOT DETECTED NOT DETECTED Final   Stenotrophomonas maltophilia NOT DETECTED NOT DETECTED Final   Candida albicans NOT DETECTED NOT DETECTED Final   Candida auris NOT DETECTED NOT DETECTED Final   Candida glabrata NOT DETECTED NOT DETECTED Final   Candida krusei NOT DETECTED NOT DETECTED Final   Candida parapsilosis NOT DETECTED NOT DETECTED Final   Candida tropicalis NOT DETECTED NOT DETECTED Final   Cryptococcus neoformans/gattii NOT DETECTED NOT DETECTED Final    Comment: Performed at [facility], [address], [place], [place] [address]  Culture, blood (Routine X 2) w Reflex to ID Panel     Status: None (Preliminary result)   Collection Time: 12/06/23 10:55 AM   Specimen: BLOOD  Result Value Ref Range Status   Specimen Description BLOOD LEFT ANTECUBITAL  Final   Special Requests   Final    BOTTLES DRAWN AEROBIC AND ANAEROBIC Blood Culture results may not be optimal due to an inadequate volume of blood received in culture bottles   Culture   Final    NO GROWTH 3 DAYS Performed at [facility], [address], [place], [place] [address]    Report Status PENDING  Incomplete    Labs: CBC: Recent Labs  Lab 12/03/23 0458 12/06/23 1054 12/07/23 0423 12/08/23 0418  WBC 10.3 15.3* 9.8 6.6  HGB 11.5* 12.7 12.2 12.2  HCT 33.2* 36.8 35.9* 36.0  MCV 80.0 80.7 81.4 81.6  PLT 357 442* 428* 383   Basic Metabolic Panel: Recent Labs  Lab 12/03/23 0458 12/03/23 0459 12/05/23 0624 12/06/23 0447  12/06/23 1054 12/07/23 0423 12/08/23 0418 12/09/23 0406  NA  --    < > 133* 137 131* 132* 133* 134*  K  --    < > 3.6 3.7 3.5 4.2 3.5 3.4*  CL  --    < > 99 103 100 101 103 104  CO2  --    < > 23 23 17* 21* 22 20*  GLUCOSE  --    < > 99 101* 144* 118* 104* 106*  BUN  --    < > 15 19 21 21 17 14   CREATININE  --    < > 0.62 0.73 0.65 0.77 0.71 0.78  CALCIUM  --    < > 9.4 9.4 9.0 9.3 9.1 9.1  MG 2.3  --   --   --   --   --   --   --   PHOS  --    < > 3.9 3.4  --  3.1 2.4* 2.7   < > = values in this interval not displayed.   Liver Function Tests: Recent Labs  Lab 12/06/23 0447 12/06/23 1054 12/07/23 0423 12/08/23 0418 12/09/23 0406  AST  --  27  --   --   --   ALT  --  35  --   --   --   ALKPHOS  --  81  --   --   --   BILITOT  --  0.7  --   --   --  PROT  --  6.8  --   --   --   ALBUMIN 3.1* 3.3* 3.0* 3.0* 2.9*   CBG: Recent Labs  Lab 12/08/23 1145 12/08/23 1522 12/08/23 2005 12/08/23 2312 12/09/23 0322  GLUCAP 109* 158* 98 131* 114*    Discharge time spent: 35 minutes.  Signed: Concepcion Riser, MD [facility] 12/09/2023

## 2023-12-09 NOTE — Progress Notes (Signed)
  NEUROSURGERY PROGRESS NOTE   No issues overnight. Pt appears much more awake, interactive today. Family at bedside.  EXAM:  BP (!) 94/58 (BP Location: Left Arm)   Pulse 76   Temp 97.7 F (36.5 C) (Oral)   Resp 17   Ht 5' 7 (1.702 m)   Wt 86.3 kg   LMP  (LMP Unknown)   SpO2 98%   BMI 29.80 kg/m   Awake, alert Speech fluent, appropriate  CN grossly intact  MAE well  IMPRESSION:  63 y.o. 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 Does appear she will benefit from CIR, stable from neurosurgical standpoint  PLAN: - Cont supportive care per [facility] - CIR when stable from medical standpoint   Gerldine Maizes, MD [facility]

## 2023-12-09 NOTE — Discharge Summary (Signed)
 Physician Discharge Summary  Patient ID: Andrea Bridges MRN: [id number] DOB/AGE: Jul 26, 1960 63 y.o.  Admit date: 12/09/2023 Discharge date: 12/21/2023  Discharge Diagnoses:  Principal Problem:   Nontraumatic subcortical hemorrhage of left cerebral hemisphere [facility]) DVT prophylaxis Seizure prophylaxis Fever/E. coli UTI/urinary retention Incidental finding of 7 mm nodular density in the medial right lung base Hyponatremia/SIADH Vasospasm prevention Tobacco use Overweight/class I obesity Polycystic ovarian syndrome E. coli UTI/urinary retention Hypotension Class I obesity  Discharged Condition: Stable  Significant Diagnostic Studies: DG CHEST PORT 1 VIEW Result Date: 12/08/2023 CLINICAL DATA:  Fever EXAM: PORTABLE CHEST 1 VIEW COMPARISON:  Chest x-ray 11/26/2023 FINDINGS: The heart size and mediastinal contours are within normal limits. There is a 7 mm nodular density in the medial right lung base. The lungs are otherwise clear. There is no pleural effusion or pneumothorax. The visualized skeletal structures are unremarkable. IMPRESSION: 1. No acute cardiopulmonary process. 2. 7 mm nodular density in the medial right lung base. Recommend further evaluation with chest CT. Electronically Signed   By: Greig Pique M.D.   On: 12/08/2023 15:42   EEG adult Result Date: 12/06/2023 Gregg Lek, MD     12/06/2023 11:22 AM [name] MRN: [id number] Epilepsy Attending: Lek Gregg Referring Physician/Provider: Date: 12/06/2023 Duration: 25 minutes  Patient history: 63 year old woman with  SAH, now with seizure like activity, EEG to rule out seizures Level of alertness: Awake, drowsy, sleep AEDs during EEG study: Technical aspects: This EEG study was done with scalp electrodes positioned according to the 10-20 International system of electrode placement. Electrical activity was reviewed with band pass filter of 1-70Hz , sensitivity of 7 uV/mm, display speed of 75mm/sec with a 60Hz  notched  filter applied as appropriate. EEG data were recorded continuously and digitally stored.  Video monitoring was available and reviewed as appropriate. Description: The posterior dominant rhythm consists of 3-7 Hz activity of moderate voltage (25-35 uV) seen predominantly in posterior head regions, symmetric and reactive to eye opening and eye closing. Drowsiness was characterized by attenuation of the posterior background rhythm. Sleep was not seen. EEG showed continuous generalized polymorphic sharply contoured 3 to 6 Hz theta-delta slowing. Physiologic photic driving was not seen during photic stimulation.  Hyperventilation was not performed.   ABNORMALITY - Continuous slow, generalized IMPRESSION: This study is suggestive of mild to moderate diffuse encephalopathy, nonspecific etiology but likely related to sedation, toxic-metabolic etiology. No seizures or epileptiform discharges were seen throughout the recording. Lek Gregg MD Neurology    CT HEAD WO CONTRAST ( ) Result Date: 12/06/2023 CLINICAL DATA:  Headache, sudden, severe EXAM: CT HEAD WITHOUT CONTRAST TECHNIQUE: Contiguous axial images were obtained from the base of the skull through the vertex without intravenous contrast. RADIATION DOSE REDUCTION: This exam was performed according to the departmental dose-optimization program which includes automated exposure control, adjustment of the mA and/or kV according to patient size and/or use of iterative reconstruction technique. COMPARISON:  CT the head dated December 03, 2023. FINDINGS: Brain: Since the previous study, a right frontal approach ventriculostomy catheter has been removed. There is some residual hemorrhage along the tract. There has been interval near complete resolution of intraventricular hemorrhage. There has also been interval near complete resolution of subarachnoid hemorrhage. The ventricles and sulci are proportionate in size. There are dystrophic calcifications present anterior  laterally within the left cerebellar hemisphere on image 8 of series 3. Vascular: Mild vascular calcifications. Skull: Right frontal burr craniotomy in defect. Otherwise, unremarkable. Sinuses/Orbits: Mild mucosal disease within the right  maxillary sinus. The mastoid air cells are clear. Other: None. IMPRESSION: 1. There is a cluster of dystrophic calcifications present anterior laterally within the left cerebellar hemisphere. Recommend correlation with an MRI of the brain without and with gadolinium contrast to exclude neoplasm. 2. Interval near total resolution of intraventricular hemorrhage and subarachnoid hemorrhage following removal of a right sided ventriculostomy catheter. There is mild residual hemorrhage along the tract. Electronically Signed   By: Evalene Coho M.D.   On: 12/06/2023 09:54   VAS US  TRANSCRANIAL DOPPLER Result Date: 12/03/2023  Transcranial Doppler Patient Name:  Andrea Bridges  Date of Exam:   12/02/2023 Medical Rec #: [id number]   Accession #:    [id number] Date of Birth: 09/22/1960   Patient Gender: F Patient Age:   63 years Exam Location:  [facility] Procedure:      VAS US  TRANSCRANIAL DOPPLER Referring Phys: TORIBIO SHARPS --------------------------------------------------------------------------------  Indications: Subarachnoid hemorrhage. History: Large subarachnoid hemorrhage. Limitations: Movement Comparison Study: Prior TCD done 11/30/2023 Performing Technologist: Alberta Lis RVS  Examination Guidelines: A complete evaluation includes B-mode imaging, spectral Doppler, color Doppler, and power Doppler as needed of all accessible portions of each vessel. Bilateral testing is considered an integral part of a complete examination. Limited examinations for reoccurring indications may be performed as noted.  +----------+---------------+----------+-----------+-------+ RIGHT TCD Right VM (cm/s)Depth (cm)PulsatilityComment  +----------+---------------+----------+-----------+-------+ MCA             116                   1.46            +----------+---------------+----------+-----------+-------+ ACA            -112                   1.02            +----------+---------------+----------+-----------+-------+ Term ICA        65                    1.33            +----------+---------------+----------+-----------+-------+ PCA P1          -34                   1.67            +----------+---------------+----------+-----------+-------+ Opthalmic       27                    1.83            +----------+---------------+----------+-----------+-------+ ICA siphon      58                    1.15            +----------+---------------+----------+-----------+-------+ Vertebral                                             +----------+---------------+----------+-----------+-------+  +----------+--------------+----------+-----------+-------+ LEFT TCD  Left VM (cm/s)Depth (cm)PulsatilityComment +----------+--------------+----------+-----------+-------+ MCA            120                   1.40            +----------+--------------+----------+-----------+-------+ ACA            -22  1.43            +----------+--------------+----------+-----------+-------+ Term ICA        41                   1.71            +----------+--------------+----------+-----------+-------+ PCA P1          58                   1.34            +----------+--------------+----------+-----------+-------+ Opthalmic       20                   1.81            +----------+--------------+----------+-----------+-------+ ICA siphon      38                   1.19            +----------+--------------+----------+-----------+-------+ Vertebral                                            +----------+--------------+----------+-----------+-------+ Distal ICA     -47                   1.71             +----------+--------------+----------+-----------+-------+  +------------+-------+-------+             VM cm/sComment +------------+-------+-------+ Prox Basilar  -44          +------------+-------+-------+ +---------------------+----+ Left Lindegaard Ratio2.55 +---------------------+----+  Summary:  Elevated bilateral middle crebral and right anterior cerebral artery mean flow velocities suggest mioderate stenosis.Globally elevated pulsatility indices suggest diffuse increase in intracranial pressure or generalized atherosclerosis *See table(s) above for TCD measurements and observations.  Diagnosing physician: Eather Popp MD Electronically signed by Eather Popp MD on 12/03/2023 at 11:13:07 AM.    Final    CT HEAD WO CONTRAST ( ) Result Date: 12/03/2023 EXAM: CT HEAD WITHOUT CONTRAST 12/03/2023 05:53:00 AM TECHNIQUE: CT of the head was performed without the administration of intravenous contrast. Automated exposure control, iterative reconstruction, and/or weight based adjustment of the mA/kV was utilized to reduce the radiation dose to as low as reasonably achievable. COMPARISON: CT head without contrast 11/28/2023 CLINICAL HISTORY: Hydrocephalus. Chief complaints; Headache. FINDINGS: BRAIN AND VENTRICLES: Blood products along the right frontal ventriculostomy are stable. Mild subcortical edema in the high right frontal lobe adjacent to the ventriculostomy catheter is also stable. Subarachnoid blood over the convexities bilaterally, left greater than right, is improving. Minimal intraventricular hemorrhage bilaterally is stable to slightly decreased. No hydrocephalus is present. ORBITS: No acute abnormality. SINUSES: No acute abnormality. SOFT TISSUES AND SKULL: No acute soft tissue abnormality. No skull fracture. IMPRESSION: 1. Improving subarachnoid blood over the convexities bilaterally, left greater than right. 2. Stable to slightly decreased minimal intraventricular hemorrhage  bilaterally. 3. No hydrocephalus. 4. Stable blood products along the right frontal ventriculostomy and mild subcortical edema in the high right frontal lobe adjacent to the ventriculostomy catheter. 5. No new hemorrhage or focal infarct. Electronically signed by: Lonni Necessary MD 12/03/2023 06:16 AM EDT RP Workstation: [id number]   VAS US  TRANSCRANIAL DOPPLER Result Date: 12/01/2023  Transcranial Doppler Patient Name:  Andrea Bridges  Date of Exam:   11/30/2023 Medical Rec #: [id number]   Accession #:  [id number] Date of Birth: July 07, 1960   Patient Gender: F Patient Age:   66 years Exam Location:  [facility] Procedure:      VAS US  TRANSCRANIAL DOPPLER Referring Phys: TORIBIO SHARPS --------------------------------------------------------------------------------  Indications: Subarachnoid hemorrhage. History: Right frontal ventricular catheter placement (11/20/2023). Replacement of right frontal ventricular catheter (11/28/2023).  Performing Technologist: Ricka Sturdivant-Jones RDMS, RVT  Examination Guidelines: A complete evaluation includes B-mode imaging, spectral Doppler, color Doppler, and power Doppler as needed of all accessible portions of each vessel. Bilateral testing is considered an integral part of a complete examination. Limited examinations for reoccurring indications may be performed as noted.  +----------+---------------+----------+-----------+-------+ RIGHT TCD Right VM (cm/s)Depth (cm)PulsatilityComment +----------+---------------+----------+-----------+-------+ MCA             149         4.50      1.11            +----------+---------------+----------+-----------+-------+ ACA             -94                   1.07            +----------+---------------+----------+-----------+-------+ PCA P1          52                    1.18            +----------+---------------+----------+-----------+-------+ Opthalmic       28                    1.85             +----------+---------------+----------+-----------+-------+ ICA siphon      66                    1.22            +----------+---------------+----------+-----------+-------+ Vertebral       -56                   1.19            +----------+---------------+----------+-----------+-------+ Distal ICA      42                    1.35            +----------+---------------+----------+-----------+-------+  +----------+--------------+----------+-----------+-------+ LEFT TCD  Left VM (cm/s)Depth (cm)PulsatilityComment +----------+--------------+----------+-----------+-------+ MCA            118         4.80      1.27            +----------+--------------+----------+-----------+-------+ ACA            -77                   1.13            +----------+--------------+----------+-----------+-------+ Term ICA        42                   1.10            +----------+--------------+----------+-----------+-------+ PCA P1          64                   1.32            +----------+--------------+----------+-----------+-------+ Opthalmic       20  1.23            +----------+--------------+----------+-----------+-------+ ICA siphon      61                   1.17            +----------+--------------+----------+-----------+-------+ Vertebral      -94                   1.29            +----------+--------------+----------+-----------+-------+ Distal ICA      33                   1.28            +----------+--------------+----------+-----------+-------+  +------------+-------+-------+             VM cm/sComment +------------+-------+-------+ Prox Basilar  -38          +------------+-------+-------+ Dist Basilar  -46          +------------+-------+-------+ +----------------------+---+ Right Lindegaard Ratio3.5 +----------------------+---+ +---------------------+---+ Left Lindegaard Ratio3.5 +---------------------+---+  Summary:   Eleveated mean flow velocities suggestive of moderate right middle cerebral and mild right anterior cerebral and left middle cerebral artery vasospasm.Globally elevate dpulsatility indices suggest increase intracranial pressure likely *See table(s) above for TCD measurements and observations.  Diagnosing physician: Eather Popp MD Electronically signed by Eather Popp MD on 12/01/2023 at 8:19:04 AM.    Final    IR US  Guide Vasc Access Right PROCEDURE: Diagnostic Cerebral Angiogram  SURGEON:  Dr. Gerldine Maizes, MD  HISTORY:  The patient is a 63 y.o. yo female admitted to the hospital with sudden onset of headache and initial CT scan demonstrating diffuse subarachnoid hemorrhage.  Her initial CT angiogram was negative.  We attempted diagnostic cerebral angiogram upon admission which was aborted as the patient was somewhat agitated and removed her femoral sheath mid procedure.  She has been monitored in the intensive care unit with largely stable neurologic condition.  She presents today for follow-up diagnostic cerebral angiogram.  APPROACH:  The technical aspects of the procedure as well as its potential risks and benefits were reviewed with the patient and her family. These risks included but were not limited bleeding, infection, allergic reaction, damage to organs/vital structures, stroke, non-diagnostic procedure, and the catastrophic outcomes of heart attack, coma, and death. With an understanding of these risks, informed consent was obtained and witnessed.   The patient was placed in the supine position on the angiography table and the skin of right groin prepped in the usual sterile fashion. The procedure was performed under local anesthesia (1%-solution of bicarbonate-bufferred Lidoacaine) and conscious sedation with Versed  and fentanyl  monitored by the in-suite nurse and myself, including non-invasive blood pressure and continuous pulse oxymetry.   Access to the right common femoral artery was obtained  under ultrasound guidance using a micropuncture needle.  This allowed direct visualization of the micropuncture needle into the lumen of the right common femoral artery.  A short 5 French sheath was placed using standard Seldinger technique.  HEPARIN : 0 Units total.   CONTRAST AGENT: See IR records  FLUOROSCOPY TIME: See IR records   CATHETER(S) AND WIRE(S):   5-French JB-1 glidecatheter  0.035 glidewire   VESSELS CATHETERIZED:  Right internal carotid  Left internal carotid  Right vertebral  Left vertebral  Right common femoral  VESSELS STUDIED:  Right internal carotid, head Left internal carotid, head Left vertebral Right vertebral Right femoral  PROCEDURAL NARRATIVE:  A 5-Fr JB-1  terumo glide catheter was advanced over a 0.035 glidewire into the aortic arch. The above vessels were then sequentially catheterized and cervical/cerebral angiograms taken. After review of images, the catheter was removed without incident.   INTERPRETATION:  Right internal carotid, head:  Injection reveals the presence of a widely patent ICA, M1, and A1 segments and their branches. No aneurysms, arteriovenous malformations, or high flow fistulas are visualized.  There is mild to moderate vasospasm involving primarily the proximal segments of the middle and anterior cerebral arteries.  There is no flow mentation.  The parenchymal and venous phases are unremarkable. The venous sinuses are widely patent.   Left internal carotid, head:  Injection reveals the presence of a widely patent ICA, A1, and M1 segments and their branches. No aneurysms, arteriovenous malformations, or high flow fistulas are visualized.  Similar to the contralateral side, there is mild to moderate vasospasm involving the proximal segments of the middle cerebral artery.  There is no flow limitation.  The parenchymal and venous phases are unremarkable. The venous sinuses are widely patent.   Right vertebral:  Injection reveals the presence of a widely patent vertebral  artery. This leads to a widely patent basilar artery that terminates in left P1, while the right P1 is largely hypoplastic. The basilar apex is normal. No aneurysms, arteriovenous malformations, or high flow fistulas are visualized.  No significant vasospasm of the posterior circulation is noted.  The parenchymal and venous phases are normal. The venous sinuses are patent.   Right vertebral:   Normal vessel. No PICA aneurysm. See basilar description above.   Right femoral:   Normal vessel. No significant atherosclerotic disease. Arterial sheath in adequate position.  DISPOSITION: Upon completion of the study, the femoral sheath was removed and hemostasis obtained using a 5-Fr Exoseal closure device. Good proximal and distal lower extremity pulses were documented upon achievement of hemostasis. The procedure was well tolerated and no early complications were observed.  Patient was then transferred back to the neurointensive care unit for further care.  IMPRESSION: 1.  No intracranial aneurysms, arteriovenous malformations, or high flow fistulas are identified on this follow-up diagnostic cerebral angiogram as a source of subarachnoid hemorrhage. 2.  There is mild to moderate vasospasm involving the proximal segments of the anterior and middle cerebral arteries bilaterally, without any flow limitation.    Gerldine Maizes, MD [facility]  Electronically signed by Maizes Gerldine, MD at 11/29/2023 11:44 AM   IR ANGIO VERTEBRAL SEL VERTEBRAL BILAT MOD SED PROCEDURE: Diagnostic Cerebral Angiogram  SURGEON:  Dr. Gerldine Maizes, MD  HISTORY:  The patient is a 62 y.o. yo female admitted to the hospital with sudden onset of headache and initial CT scan demonstrating diffuse subarachnoid hemorrhage.  Her initial CT angiogram was negative.  We attempted diagnostic cerebral angiogram upon admission which was aborted as the patient was somewhat agitated and removed her femoral sheath mid  procedure.  She has been monitored in the intensive care unit with largely stable neurologic condition.  She presents today for follow-up diagnostic cerebral angiogram.  APPROACH:  The technical aspects of the procedure as well as its potential risks and benefits were reviewed with the patient and her family. These risks included but were not limited bleeding, infection, allergic reaction, damage to organs/vital structures, stroke, non-diagnostic procedure, and the catastrophic outcomes of heart attack, coma, and death. With an understanding of these risks, informed consent was obtained and witnessed.   The patient was placed in the supine position  on the angiography table and the skin of right groin prepped in the usual sterile fashion. The procedure was performed under local anesthesia (1%-solution of bicarbonate-bufferred Lidoacaine) and conscious sedation with Versed  and fentanyl  monitored by the in-suite nurse and myself, including non-invasive blood pressure and continuous pulse oxymetry.   Access to the right common femoral artery was obtained under ultrasound guidance using a micropuncture needle.  This allowed direct visualization of the micropuncture needle into the lumen of the right common femoral artery.  A short 5 French sheath was placed using standard Seldinger technique.  HEPARIN : 0 Units total.   CONTRAST AGENT: See IR records  FLUOROSCOPY TIME: See IR records   CATHETER(S) AND WIRE(S):   5-French JB-1 glidecatheter  0.035 glidewire   VESSELS CATHETERIZED:  Right internal carotid  Left internal carotid  Right vertebral  Left vertebral  Right common femoral  VESSELS STUDIED:  Right internal carotid, head Left internal carotid, head Left vertebral Right vertebral Right femoral  PROCEDURAL NARRATIVE:  A 5-Fr JB-1 terumo glide catheter was advanced over a 0.035 glidewire into the aortic arch. The above vessels were then sequentially catheterized and cervical/cerebral angiograms taken. After review of  images, the catheter was removed without incident.   INTERPRETATION:  Right internal carotid, head:  Injection reveals the presence of a widely patent ICA, M1, and A1 segments and their branches. No aneurysms, arteriovenous malformations, or high flow fistulas are visualized.  There is mild to moderate vasospasm involving primarily the proximal segments of the middle and anterior cerebral arteries.  There is no flow mentation.  The parenchymal and venous phases are unremarkable. The venous sinuses are widely patent.   Left internal carotid, head:  Injection reveals the presence of a widely patent ICA, A1, and M1 segments and their branches. No aneurysms, arteriovenous malformations, or high flow fistulas are visualized.  Similar to the contralateral side, there is mild to moderate vasospasm involving the proximal segments of the middle cerebral artery.  There is no flow limitation.  The parenchymal and venous phases are unremarkable. The venous sinuses are widely patent.   Right vertebral:  Injection reveals the presence of a widely patent vertebral artery. This leads to a widely patent basilar artery that terminates in left P1, while the right P1 is largely hypoplastic. The basilar apex is normal. No aneurysms, arteriovenous malformations, or high flow fistulas are visualized.  No significant vasospasm of the posterior circulation is noted.  The parenchymal and venous phases are normal. The venous sinuses are patent.   Right vertebral:   Normal vessel. No PICA aneurysm. See basilar description above.   Right femoral:   Normal vessel. No significant atherosclerotic disease. Arterial sheath in adequate position.  DISPOSITION: Upon completion of the study, the femoral sheath was removed and hemostasis obtained using a 5-Fr Exoseal closure device. Good proximal and distal lower extremity pulses were documented upon achievement of hemostasis. The procedure was well tolerated and no early complications were observed.   Patient was then transferred back to the neurointensive care unit for further care.  IMPRESSION: 1.  No intracranial aneurysms, arteriovenous malformations, or high flow fistulas are identified on this follow-up diagnostic cerebral angiogram as a source of subarachnoid hemorrhage. 2.  There is mild to moderate vasospasm involving the proximal segments of the anterior and middle cerebral arteries bilaterally, without any flow limitation.    Gerldine Maizes, MD [facility]  Electronically signed by Maizes Gerldine, MD at 11/29/2023 11:44 AM   IR ANGIO  INTRA EXTRACRAN SEL INTERNAL CAROTID BILAT MOD SED PROCEDURE: Diagnostic Cerebral Angiogram  SURGEON:  Dr. Gerldine Maizes, MD  HISTORY:  The patient is a 63 y.o. yo female admitted to the hospital with sudden onset of headache and initial CT scan demonstrating diffuse subarachnoid hemorrhage.  Her initial CT angiogram was negative.  We attempted diagnostic cerebral angiogram upon admission which was aborted as the patient was somewhat agitated and removed her femoral sheath mid procedure.  She has been monitored in the intensive care unit with largely stable neurologic condition.  She presents today for follow-up diagnostic cerebral angiogram.  APPROACH:  The technical aspects of the procedure as well as its potential risks and benefits were reviewed with the patient and her family. These risks included but were not limited bleeding, infection, allergic reaction, damage to organs/vital structures, stroke, non-diagnostic procedure, and the catastrophic outcomes of heart attack, coma, and death. With an understanding of these risks, informed consent was obtained and witnessed.   The patient was placed in the supine position on the angiography table and the skin of right groin prepped in the usual sterile fashion. The procedure was performed under local anesthesia (1%-solution of bicarbonate-bufferred Lidoacaine) and conscious  sedation with Versed  and fentanyl  monitored by the in-suite nurse and myself, including non-invasive blood pressure and continuous pulse oxymetry.   Access to the right common femoral artery was obtained under ultrasound guidance using a micropuncture needle.  This allowed direct visualization of the micropuncture needle into the lumen of the right common femoral artery.  A short 5 French sheath was placed using standard Seldinger technique.  HEPARIN : 0 Units total.   CONTRAST AGENT: See IR records  FLUOROSCOPY TIME: See IR records   CATHETER(S) AND WIRE(S):   5-French JB-1 glidecatheter  0.035 glidewire   VESSELS CATHETERIZED:  Right internal carotid  Left internal carotid  Right vertebral  Left vertebral  Right common femoral  VESSELS STUDIED:  Right internal carotid, head Left internal carotid, head Left vertebral Right vertebral Right femoral  PROCEDURAL NARRATIVE:  A 5-Fr JB-1 terumo glide catheter was advanced over a 0.035 glidewire into the aortic arch. The above vessels were then sequentially catheterized and cervical/cerebral angiograms taken. After review of images, the catheter was removed without incident.   INTERPRETATION:  Right internal carotid, head:  Injection reveals the presence of a widely patent ICA, M1, and A1 segments and their branches. No aneurysms, arteriovenous malformations, or high flow fistulas are visualized.  There is mild to moderate vasospasm involving primarily the proximal segments of the middle and anterior cerebral arteries.  There is no flow mentation.  The parenchymal and venous phases are unremarkable. The venous sinuses are widely patent.   Left internal carotid, head:  Injection reveals the presence of a widely patent ICA, A1, and M1 segments and their branches. No aneurysms, arteriovenous malformations, or high flow fistulas are visualized.  Similar to the contralateral side, there is mild to moderate vasospasm involving the proximal segments of the middle cerebral  artery.  There is no flow limitation.  The parenchymal and venous phases are unremarkable. The venous sinuses are widely patent.   Right vertebral:  Injection reveals the presence of a widely patent vertebral artery. This leads to a widely patent basilar artery that terminates in left P1, while the right P1 is largely hypoplastic. The basilar apex is normal. No aneurysms, arteriovenous malformations, or high flow fistulas are visualized.  No significant vasospasm of the posterior circulation is noted.  The parenchymal and  venous phases are normal. The venous sinuses are patent.   Right vertebral:   Normal vessel. No PICA aneurysm. See basilar description above.   Right femoral:   Normal vessel. No significant atherosclerotic disease. Arterial sheath in adequate position.  DISPOSITION: Upon completion of the study, the femoral sheath was removed and hemostasis obtained using a 5-Fr Exoseal closure device. Good proximal and distal lower extremity pulses were documented upon achievement of hemostasis. The procedure was well tolerated and no early complications were observed.  Patient was then transferred back to the neurointensive care unit for further care.  IMPRESSION: 1.  No intracranial aneurysms, arteriovenous malformations, or high flow fistulas are identified on this follow-up diagnostic cerebral angiogram as a source of subarachnoid hemorrhage. 2.  There is mild to moderate vasospasm involving the proximal segments of the anterior and middle cerebral arteries bilaterally, without any flow limitation.    Gerldine Maizes, MD [facility]  Electronically signed by Maizes Gerldine, MD at 11/29/2023 11:44 AM   VAS US  TRANSCRANIAL DOPPLER Result Date: 11/28/2023  Transcranial Doppler Patient Name:  Andrea Bridges  Date of Exam:   11/28/2023 Medical Rec #: [id number]   Accession #:    [id number] Date of Birth: 1960-05-22   Patient Gender: F Patient Age:   89 years Exam Location:  [facility] Procedure:      VAS US  TRANSCRANIAL DOPPLER Referring Phys: TORIBIO SHARPS --------------------------------------------------------------------------------  Indications: Subarachnoid hemorrhage. History: Right frontal ventricular catheter placement (11/20/2023). Replacement of right frontal ventricular catheter (11/28/2023). Comparison Study: Previous exam was on 11/25/2023 Performing Technologist: Ezzie Potters RVT, RDMS  Examination Guidelines: A complete evaluation includes B-mode imaging, spectral Doppler, color Doppler, and power Doppler as needed of all accessible portions of each vessel. Bilateral testing is considered an integral part of a complete examination. Limited examinations for reoccurring indications may be performed as noted.  +----------+---------------+----------+-----------+-------+ RIGHT TCD Right VM (cm/s)Depth (cm)PulsatilityComment +----------+---------------+----------+-----------+-------+ MCA             132         5.30      1.23            +----------+---------------+----------+-----------+-------+ ACA             -31                   1.24            +----------+---------------+----------+-----------+-------+ Term ICA        36                    1.36            +----------+---------------+----------+-----------+-------+ PCA P1          39                    1.48            +----------+---------------+----------+-----------+-------+ Opthalmic       23                    2.11            +----------+---------------+----------+-----------+-------+ ICA siphon      54                    1.07            +----------+---------------+----------+-----------+-------+ Vertebral       -63  6.80      1.22            +----------+---------------+----------+-----------+-------+ Distal ICA      28                    0.85            +----------+---------------+----------+-----------+-------+   +----------+--------------+----------+-----------+-------+ LEFT TCD  Left VM (cm/s)Depth (cm)PulsatilityComment +----------+--------------+----------+-----------+-------+ MCA            137         5.20      1.32            +----------+--------------+----------+-----------+-------+ ACA            -20                   1.31            +----------+--------------+----------+-----------+-------+ Term ICA        52                   1.15            +----------+--------------+----------+-----------+-------+ PCA P1          54                   1.08            +----------+--------------+----------+-----------+-------+ Opthalmic       17                   1.89            +----------+--------------+----------+-----------+-------+ ICA siphon      34                   1.06            +----------+--------------+----------+-----------+-------+ Vertebral      -81         7.00      1.26            +----------+--------------+----------+-----------+-------+ Distal ICA      26                   1.41            +----------+--------------+----------+-----------+-------+  +------------+-------+-------+             VM cm/sComment +------------+-------+-------+ Prox Basilar  -41   0.95   +------------+-------+-------+ Dist Basilar  -59   1.09   +------------+-------+-------+ +----------------------+----+ Right Lindegaard Ratio4.71 +----------------------+----+ +---------------------+----+ Left Lindegaard Ratio5.27 +---------------------+----+  Summary:  Elevated mean velocity and Lindegaard Ratio noted bilaterally, indicating moderate vasospasm of both middle cerebral arteries. *See table(s) above for TCD measurements and observations.  Diagnosing physician: Eather Popp MD Electronically signed by Eather Popp MD on 11/28/2023 at 5:24:54 PM.    Final    CT HEAD WO CONTRAST ( ) Result Date: 11/28/2023 EXAM: CT HEAD WITHOUT CONTRAST 11/28/2023 03:11:00 AM  TECHNIQUE: CT of the head was performed without the administration of intravenous contrast. Automated exposure control, iterative reconstruction, and/or weight based adjustment of the mA/kV was utilized to reduce the radiation dose to as low as reasonably achievable. COMPARISON: 11/25/2023 CLINICAL HISTORY: Stroke, follow up. Headache. FINDINGS: BRAIN AND VENTRICLES: Unchanged small volume subarachnoid hemorrhage over both hemispheres. Unchanged small intraparenchymal hematoma along the proximal course of the shunt catheter, which terminates at the right foramen of Monro. Small amount of blood layering within the occipital horns of the lateral ventricles, unchanged. The size and configuration  of the lateral ventricles is unchanged. ORBITS: No acute abnormality. SINUSES: Right maxillary sinus opacification. SOFT TISSUES AND SKULL: No acute soft tissue abnormality. No skull fracture. IMPRESSION: 1. Unchanged small volume subarachnoid hemorrhage over both hemispheres. 2. Unchanged small intraparenchymal hematoma along the proximal course of the shunt catheter, which terminates at the right foramen of [place]. 3. Small amount of blood layering within the occipital horns of the lateral ventricles, unchanged. 4. Unchanged size and configuration of the lateral ventricles. Electronically signed by: Franky Stanford MD 11/28/2023 03:21 AM EDT RP Workstation: [id number]   DG CHEST PORT 1 VIEW Result Date: 11/26/2023 CLINICAL DATA:  Fever.  Subarachnoid hemorrhage EXAM: PORTABLE CHEST 1 VIEW COMPARISON:  Chest x-ray 11/21/2023. FINDINGS: Enteric tube with tip extending beneath the diaphragm. Overlapping cardiac leads. No pneumothorax or edema. No effusion. Normal cardiopericardial silhouette. Mild bandlike changes along the lung bases. Atelectasis is favored over infiltrate but recommend follow-up. IMPRESSION: Bandlike opacities along the lung bases. Favor atelectasis over infiltrate but recommend follow-up. Enteric tube.  Electronically Signed   By: Ranell Bring M.D.   On: 11/26/2023 18:56   EEG adult Result Date: 11/26/2023 Michaela Aisha SQUIBB, MD     11/26/2023  3:11 PM History: 63 yo F with lethargy in the setting of SAH, evaluate for evidence of seizure EEG Duration: 22 minutes Sedation: none Patient State: Awake and asleep Technique: This EEG was acquired with electrodes placed according to the International 10-20 electrode system (including Fp1, Fp2, F3, F4, C3, C4, P3, P4, O1, O2, T3, T4, T5, T6, A1, A2, Fz, Cz, Pz). The following electrodes were missing or displaced: none. Background:  There is a posterior dominant rhythm of 8 to 9 Hz which is seen at times.  There is also irregular delta and theta activity intruding into the background.  With drowsiness there is an increase in slow activity and the patient does have symmetric appearing sleep structure seen during the recording. Photic stimulation: Physiologic driving is not performed EEG Abnormalities: 1) generalized irregular slow activity Clinical Interpretation: This EEG is consistent with a mild generalized nonspecific cerebral dysfunction (encephalopathy). There was no seizure or seizure predisposition recorded on this study. Please note that lack of epileptiform activity on EEG does not preclude the possibility of epilepsy. Aisha Michaela, MD [facility] If 7pm- 7am, please page neurology on call as listed in AMION.  CT HEAD WO CONTRAST ( ) Result Date: 11/25/2023 EXAM: CT HEAD WITHOUT CONTRAST 11/25/2023 04:29:05 PM TECHNIQUE: CT of the head was performed without the administration of intravenous contrast. Automated exposure control, iterative reconstruction, and/or weight based adjustment of the mA/kV was utilized to reduce the radiation dose to as low as reasonably achievable. COMPARISON: CT head 11/20/2023. CLINICAL HISTORY: Headache, increasing frequency or severity. FINDINGS: BRAIN AND VENTRICLES: Interval placement of a right frontal  approach ventriculostomy and catheter with tip terminating in the third ventricle. Small amount of parenchymal hemorrhage along the ventriculostomy catheter tract. Unchanged mild dilation of the lateral ventricles. Interval redistribution of subarachnoid hemorrhage along the cerebral convexities and associated slight increase in intraventricular hemorrhage. ORBITS: No acute abnormality. SINUSES: No acute abnormality. SOFT TISSUES AND SKULL: No acute soft tissue abnormality. No skull fracture. IMPRESSION: 1. Interval placement of a right frontal approach ventriculostomy and catheter with tip terminating in the third ventricle. Small amount of parenchymal hemorrhage along the ventriculostomy catheter tract. 2. Unchanged mild dilation of the lateral ventricles. 3. Interval redistribution of subarachnoid hemorrhage along the cerebral convexities and associated slight increase in intraventricular hemorrhage. Electronically  signed by: Ryan Chess MD 11/25/2023 04:41 PM EDT RP Workstation: [id number]   VAS US  TRANSCRANIAL DOPPLER Result Date: 11/25/2023  Transcranial Doppler Patient Name:  Andrea Bridges  Date of Exam:   11/25/2023 Medical Rec #: [id number]   Accession #:    [id number] Date of Birth: 19-May-1960   Patient Gender: F Patient Age:   56 years Exam Location:  [facility] Procedure:      VAS US  TRANSCRANIAL DOPPLER Referring Phys: TORIBIO SHARPS --------------------------------------------------------------------------------  Indications: Subarachnoid hemorrhage. History: Right frontal ventricular catheter placement (11/20/2023). Comparison Study: Previous exam on 11/23/2023 Performing Technologist: Ezzie Potters RVT, RDMS  Examination Guidelines: A complete evaluation includes B-mode imaging, spectral Doppler, color Doppler, and power Doppler as needed of all accessible portions of each vessel. Bilateral testing is considered an integral part of a complete examination. Limited examinations for reoccurring  indications may be performed as noted.  +----------+---------------+----------+-----------+--------+ RIGHT TCD Right VM (cm/s)Depth (cm)PulsatilityComment  +----------+---------------+----------+-----------+--------+ MCA             70          5.20      1.30    130 cm/s +----------+---------------+----------+-----------+--------+ ACA             -38                   1.14             +----------+---------------+----------+-----------+--------+ Term ICA        48                    1.31             +----------+---------------+----------+-----------+--------+ PCA P1          35                    1.33             +----------+---------------+----------+-----------+--------+ Opthalmic       15                    2.04             +----------+---------------+----------+-----------+--------+ ICA siphon      38                    1.14             +----------+---------------+----------+-----------+--------+ Vertebral       -33                   0.98             +----------+---------------+----------+-----------+--------+ Distal ICA      36                    1.22             +----------+---------------+----------+-----------+--------+  +----------+--------------+----------+-----------+-------+ LEFT TCD  Left VM (cm/s)Depth (cm)PulsatilityComment +----------+--------------+----------+-----------+-------+ MCA             63                   1.21            +----------+--------------+----------+-----------+-------+ ACA            -34                   1.17            +----------+--------------+----------+-----------+-------+ Term ICA  45                   1.42            +----------+--------------+----------+-----------+-------+ PCA P1          30                   1.22            +----------+--------------+----------+-----------+-------+ Opthalmic       14                   2.17             +----------+--------------+----------+-----------+-------+ ICA siphon      33                   1.11            +----------+--------------+----------+-----------+-------+ Vertebral      -34                   1.25            +----------+--------------+----------+-----------+-------+ Distal ICA      25                   1.14            +----------+--------------+----------+-----------+-------+  +------------+-------+-------+             VM cm/sComment +------------+-------+-------+ Prox Basilar  -41   0.98   +------------+-------+-------+ Dist Basilar  -52   0.96   +------------+-------+-------+ +----------------------+----+ Right Lindegaard Ratio1.94 +----------------------+----+ +---------------------+----+ Left Lindegaard Ratio2.52 +---------------------+----+  Summary: This was a normal transcranial Doppler study, with normal flow direction and velocity of all identified vessels of the anterior and posterior circulations, with no evidence of stenosis, vasospasm or occlusion. There was no evidence of intracranial disease.  *See table(s) above for TCD measurements and observations.  Diagnosing physician: Eather Popp MD Electronically signed by Eather Popp MD on 11/25/2023 at 1:18:57 PM.    Final    VAS US  TRANSCRANIAL DOPPLER Result Date: 11/25/2023  Transcranial Doppler Patient Name:  Andrea Bridges  Date of Exam:   11/23/2023 Medical Rec #: [id number]   Accession #:    [id number] Date of Birth: Aug 19, 1960   Patient Gender: F Patient Age:   35 years Exam Location:  [facility] Procedure:      VAS US  TRANSCRANIAL DOPPLER Referring Phys: TORIBIO SHARPS --------------------------------------------------------------------------------  Indications: Subarachnoid hemorrhage. History: Right frontal ventricular catheter placement (11/20/2023). Comparison Study: Previous exam 11/21/2023 Performing Technologist: Ezzie Potters RVT, RDMS  Examination Guidelines: A complete evaluation includes  B-mode imaging, spectral Doppler, color Doppler, and power Doppler as needed of all accessible portions of each vessel. Bilateral testing is considered an integral part of a complete examination. Limited examinations for reoccurring indications may be performed as noted.  +----------+---------------+----------+-----------+-------+ RIGHT TCD Right VM (cm/s)Depth (cm)PulsatilityComment +----------+---------------+----------+-----------+-------+ MCA             60                    0.92            +----------+---------------+----------+-----------+-------+ ACA             -18                   1.32            +----------+---------------+----------+-----------+-------+ Term ICA        44  1.20            +----------+---------------+----------+-----------+-------+ PCA P1          37                    0.89            +----------+---------------+----------+-----------+-------+ Opthalmic       19                    1.85            +----------+---------------+----------+-----------+-------+ ICA siphon      20                    0.64            +----------+---------------+----------+-----------+-------+ Vertebral       -54                   0.96            +----------+---------------+----------+-----------+-------+ Distal ICA      36                    1.12            +----------+---------------+----------+-----------+-------+  +----------+--------------+----------+-----------+-------+ LEFT TCD  Left VM (cm/s)Depth (cm)PulsatilityComment +----------+--------------+----------+-----------+-------+ MCA             62                   1.05            +----------+--------------+----------+-----------+-------+ ACA            -40                   1.00            +----------+--------------+----------+-----------+-------+ Term ICA        43                   0.93            +----------+--------------+----------+-----------+-------+ PCA  P1          39                   0.88            +----------+--------------+----------+-----------+-------+ Opthalmic       14                   1.91            +----------+--------------+----------+-----------+-------+ ICA siphon      43                   0.93            +----------+--------------+----------+-----------+-------+ Vertebral      -67                   0.90            +----------+--------------+----------+-----------+-------+ Distal ICA      26                   1.26            +----------+--------------+----------+-----------+-------+  +------------+-------+-------+             VM cm/sComment +------------+-------+-------+ Prox Basilar  -43   0.84   +------------+-------+-------+ Dist Basilar  -51   0.83   +------------+-------+-------+ +----------------------+----+ Right Lindegaard Ratio1.67 +----------------------+----+ +---------------------+----+ Left Lindegaard  Ratio2.38 +---------------------+----+  Summary: This was a normal transcranial Doppler study, with normal flow direction and velocity of all identified vessels of the anterior and posterior circulations, with no evidence of stenosis, vasospasm or occlusion. There was no evidence of intracranial disease. *See table(s) above for TCD measurements and observations.  Diagnosing physician: Eather Popp MD Electronically signed by Eather Popp MD on 11/25/2023 at 1:17:19 PM.    Final    VAS US  TRANSCRANIAL DOPPLER Result Date: 11/25/2023  Transcranial Doppler Patient Name:  Andrea Bridges  Date of Exam:   11/21/2023 Medical Rec #: [id number]   Accession #:    [id number] Date of Birth: 02-May-1961   Patient Gender: F Patient Age:   46 years Exam Location:  [facility] Procedure:      VAS US  TRANSCRANIAL DOPPLER Referring Phys: TORIBIO SHARPS --------------------------------------------------------------------------------  Indications: Subarachnoid hemorrhage. Limitations: Patient immobility  Comparison Study: No prior studies. Performing Technologist: Cordella Collet RVT  Examination Guidelines: A complete evaluation includes B-mode imaging, spectral Doppler, color Doppler, and power Doppler as needed of all accessible portions of each vessel. Bilateral testing is considered an integral part of a complete examination. Limited examinations for reoccurring indications may be performed as noted.  +----------+---------------+----------+-----------+------------------+ RIGHT TCD Right VM (cm/s)Depth (cm)Pulsatility     Comment       +----------+---------------+----------+-----------+------------------+ MCA             50                    1.24                       +----------+---------------+----------+-----------+------------------+ ACA                                           Unable to insonate +----------+---------------+----------+-----------+------------------+ Term ICA        25                    1.27                       +----------+---------------+----------+-----------+------------------+ PCA P1          25                    1.14                       +----------+---------------+----------+-----------+------------------+ Opthalmic       14                    1.57                       +----------+---------------+----------+-----------+------------------+ ICA siphon      22                    0.83                       +----------+---------------+----------+-----------+------------------+ Vertebral                                     Unable to insonate +----------+---------------+----------+-----------+------------------+ Distal ICA      -15  1.19                       +----------+---------------+----------+-----------+------------------+  +----------+--------------+----------+-----------+------------------+ LEFT TCD  Left VM (cm/s)Depth (cm)Pulsatility     Comment        +----------+--------------+----------+-----------+------------------+ MCA             51                   0.91                       +----------+--------------+----------+-----------+------------------+ ACA                                          Unable to insonate +----------+--------------+----------+-----------+------------------+ Term ICA                                     Unable to insonate +----------+--------------+----------+-----------+------------------+ PCA P1          22                   1.29                       +----------+--------------+----------+-----------+------------------+ Opthalmic       17                   1.71                       +----------+--------------+----------+-----------+------------------+ ICA siphon                                   Unable to insonate +----------+--------------+----------+-----------+------------------+ Vertebral                                    Unable to insonate +----------+--------------+----------+-----------+------------------+ Distal ICA     -17                   1.25                       +----------+--------------+----------+-----------+------------------+  +------------+-------+------------------+             VM cm/s     Comment       +------------+-------+------------------+ Prox Basilar       Unable to insonate +------------+-------+------------------+ Dist Basilar       Unable to insonate +------------+-------+------------------+ +----------------------+----+ Right Lindegaard Ratio3.33 +----------------------+----+ +---------------------+-+ Left Lindegaard Ratio3 +---------------------+-+  Summary:  Normal mean flow velocities in anterior cerebral circulation without any evidence of vasospasm. Poor suboccipital window limits evaluation of posterior circulation vessels. *See table(s) above for TCD measurements and observations.  Diagnosing physician: Electronically signed by  Eather Popp MD on 11/25/2023 at 1:16:47 PM.    Final    IR ANGIO VERTEBRAL SEL VERTEBRAL UNI R MOD SED PROCEDURE: Diagnostic Cerebral Angiogram  SURGEON:  Dr. Gerldine Maizes, MD  HISTORY:  The patient is a 63 y.o. yo female admitted with sudden onset severe headache, obtundation, and CT scan demonstrating diffuse subarachnoid hemorrhage.  CT angiogram was negative for intracranial aneurysm however given the degree of hemorrhage and  presence of hydrocephalus, there is high suspicion for underlying vascular malformation/aneurysm.  She therefore presents for diagnostic cerebral angiogram.  APPROACH:  The technical aspects of the procedure as well as its potential risks and benefits were reviewed with the patient and her husband. These risks included but were not limited bleeding, infection, allergic reaction, damage to organs/vital structures, stroke, non-diagnostic procedure, and the catastrophic outcomes of heart attack, coma, and death. With an understanding of these risks, informed consent was obtained and witnessed.   The patient was placed in the supine position on the angiography table and the skin of right groin prepped in the usual sterile fashion. The procedure was performed under local anesthesia (1%-solution of bicarbonate-bufferred Lidoacaine) and fentanyl  monitored by the in-suite nurse and myself, including non-invasive blood pressure and continuous pulse oxymetry.   Access to the right common femoral artery was obtained using standard Seldinger technique.  A short 5 French sheath was introduced and connected to continuous heparinized saline flush.  HEPARIN : 0 Units total.   CONTRAST AGENT: See IR records  FLUOROSCOPY TIME: See IR records   CATHETER(S) AND WIRE(S):   5-French JB-1 glidecatheter  0.035 glidewire   VESSELS CATHETERIZED:  Right vertebral  Right common femoral  VESSELS STUDIED:  Right vertebral Right femoral  PROCEDURAL NARRATIVE:  A 5-Fr JB-1 terumo glide catheter was advanced over a  0.035 glidewire into the aortic arch.  Initially the right vertebral artery was selectively catheterized and cerebral angiogram taken.  There was technical malfunction of the biplane unit.  The system was restarted.  During the reboot, the patient attempted to get up from the angiogram table and her right femoral sheath was inadvertently removed.  We therefore aborted the procedure at this point.  INTERPRETATION:  Right vertebral:  Injection reveals the presence of a widely patent vertebral artery. This leads to a widely patent basilar artery that terminates in bilateral P1. The basilar apex is normal. No aneurysms, arteriovenous malformations, or high flow fistulas are visualized.  There is no posterior circulation vasospasm seen.  The parenchymal and venous phases are normal. The venous sinuses are patent.   DISPOSITION: After the procedure was aborted and the patient's sheath was removed, hemostasis was secured with manual compression.  Good proximal and distal lower extremity pulses were documented upon achievement of hemostasis.  Patient was then transferred back to the intensive care unit for further care.  IMPRESSION: 1.  Prematurely aborted diagnostic cerebral angiogram, however no aneurysms, AVMs, or fistulas are seen on the sole right vertebral artery injection.    Gerldine Maizes, MD [facility]  Electronically signed by Maizes Gerldine, MD at 11/22/2023 10:17 AM   EEG adult Result Date: 11/21/2023 Shelton Arlin KIDD, MD     11/21/2023 10:35 AM [name] MRN: [id number] Epilepsy Attending: Arlin KIDD Shelton Referring Physician/Provider: Rosemarie Eather RAMAN, MD Date: 11/21/2023 Duration: 26.33 mins Patient history: 63yo F with SAH, now with ams. EEG to evaluate for seizure Level of alertness:  comatose/ lethargic AEDs during EEG study: LEV, propofol  Technical aspects: This EEG study was done with scalp electrodes positioned according to the 10-20 International  system of electrode placement. Electrical activity was reviewed with band pass filter of 1-70Hz , sensitivity of 7 uV/mm, display speed of 30mm/sec with a 60Hz  notched filter applied as appropriate. EEG data were recorded continuously and digitally stored.  Video monitoring was available and reviewed as appropriate. Description: EEG showed continuous generalized 3 to 6 Hz theta-delta slowing with overriding 13  to 15 Hz beta activity distributed symmetrically and diffusely. Hyperventilation and photic stimulation were not performed.   ABNORMALITY - Continuous slow, generalized - Excessive beta, generalized IMPRESSION: This study is suggestive of moderate to severe diffuse encephalopathy, nonspecific etiology but likely related to sedation. No seizures or epileptiform discharges were seen throughout the recording. Arlin MALVA Krebs   DG Chest Port 1 View Result Date: 11/21/2023 EXAM: 1 VIEW XRAY OF THE CHEST 11/21/2023 06:06:16 AM COMPARISON: None available. CLINICAL HISTORY: History of ETT. ETT, SAH; ROVER. FINDINGS: LUNGS AND PLEURA: No focal pulmonary opacity. No pulmonary edema. No pleural effusion. No pneumothorax. HEART AND MEDIASTINUM: No acute abnormality of the cardiac and mediastinal silhouettes. BONES AND SOFT TISSUES: No acute osseous abnormality. LINES AND TUBES: Endotracheal tube terminates 3 cm above the carina. Gastric tube courses along the inferior border of the film. IMPRESSION: 1. No acute cardiopulmonary pathology. 2. Endotracheal tube and gastric tube in appropriate position as described . Electronically signed by: Lonni Necessary MD 11/21/2023 06:10 AM EDT RP Workstation: [id number]   DG Abd 1 View Result Date: 11/20/2023 CLINICAL DATA:  Check gastric catheter placement EXAM: ABDOMEN - 1 VIEW COMPARISON:  None Available. FINDINGS: Gastric catheter extends into the stomach. No free air is seen. No obstructive changes are noted. IMPRESSION: Gastric catheter within the stomach.  Electronically Signed   By: Oneil Devonshire M.D.   On: 11/20/2023 20:34   CT HEAD CODE STROKE WO CONTRAST Result Date: 11/20/2023 EXAM: CT HEAD WITHOUT CONTRAST 11/20/2023 04:14:00 PM TECHNIQUE: CT of the head was performed without the administration of intravenous contrast. Automated exposure control, iterative reconstruction, and/or weight based adjustment of the mA/kV was utilized to reduce the radiation dose to as low as reasonably achievable. COMPARISON: None available. CLINICAL HISTORY: Neuro deficit, acute, stroke suspected. Chief complaints; headache; CT HEAD CODE STROKE WO CONTRAST; Neuro deficit, acute, stroke suspected; Deficits- right side weakness, non verbal FINDINGS: BRAIN AND VENTRICLES: Diffuse basilar subarachnoid hemorrhage with intraventricular extension and early ventriculomegaly. The hemorrhage is greatest along the left cerebellopontine angle cistern possibly indicating the site of origin. Attention on follow up CTA. ORBITS: No acute abnormality. SINUSES: No acute abnormality. SOFT TISSUES AND SKULL: No acute soft tissue abnormality. No skull fracture. IMPRESSION: 1. Diffuse basilar subarachnoid hemorrhage with intraventricular extension and early ventriculomegaly, greatest along the left cerebellopontine angle cistern, possibly indicating the site of origin. Attention on follow-up CTA. Code stroke results were communicated to Dr. Matthews at 4:15 pm on 11/20/2023 by phone. Electronically signed by: Ryan Chess MD 11/20/2023 04:35 PM EDT RP Workstation: [id number]   CT ANGIO HEAD NECK W WO CM (CODE STROKE) Result Date: 11/20/2023 EXAM: CTA HEAD AND NECK WITH AND WITHOUT 11/20/2023 04:15:00 PM TECHNIQUE: CTA of the head and neck was performed with and without the administration of intravenous contrast. Multiplanar 2D and/or 3D reformatted images are provided for review. Automated exposure control, iterative reconstruction, and/or weight based adjustment of the mA/kV was utilized to reduce the  radiation dose to as low as reasonably achievable. Stenosis of the internal carotid arteries measured using NASCET criteria. COMPARISON: None available CLINICAL HISTORY: Neuro deficit, acute, stroke suspected. Chief complaints; headache; CT ANGIO HEAD NECK W WO CM (CODE STROKE); Neuro deficit, acute, stroke suspected. FINDINGS: CTA NECK: AORTIC ARCH AND ARCH VESSELS: No dissection or arterial injury. No significant stenosis of the brachiocephalic or subclavian arteries. CERVICAL CAROTID ARTERIES: No dissection, arterial injury, or hemodynamically significant stenosis by NASCET criteria. Tortuous cervical ICAs. CERVICAL VERTEBRAL ARTERIES: Abrupt tapering of the  V4 segment of both vertebral arteries, concerning for early vasospasm, which could be masking an aneurysm. LUNGS AND MEDIASTINUM: Unremarkable. SOFT TISSUES: No acute abnormality. BONES: No acute abnormality. CTA HEAD: ANTERIOR CIRCULATION: No significant stenosis of the internal carotid arteries. No significant stenosis of the anterior cerebral arteries. No significant stenosis of the middle cerebral arteries. No aneurysm. POSTERIOR CIRCULATION: Persistent fetal origin of the right PCA. Abrupt tapering of the distal basilar artery, concerning for early vasospasm, which could be masking an aneurysm. No significant stenosis of the posterior cerebral arteries. No significant stenosis of the basilar artery. No significant stenosis of the vertebral arteries. OTHER: No dural venous sinus thrombosis on this non-dedicated study. IMPRESSION: 1. No definite aneurysm or vascular malformation is identified. Abrupt tapering of the V4 segment of both vertebral arteries and the distal basilar artery, concerning for early vasospasm, which could be masking an aneurysm. Code stroke results were communicated to Dr. Matthews at 4:26 pm on 11/20/2023 by phone. Electronically signed by: Ryan Chess MD 11/20/2023 04:33 PM EDT RP Workstation: [id number]    Labs:  Basic Metabolic  Panel: Recent Labs  Lab 12/03/23 0458 12/03/23 0459 12/04/23 9386 12/05/23 9375 12/06/23 0447 12/06/23 1054 12/07/23 0423 12/08/23 0418 12/09/23 0406  NA  --  134* 133* 133* 137 131* 132* 133* 134*  K  --  3.6 4.5 3.6 3.7 3.5 4.2 3.5 3.4*  CL  --  107 102 99 103 100 101 103 104  CO2  --  21* 19* 23 23 17* 21* 22 20*  GLUCOSE  --  113* 100* 99 101* 144* 118* 104* 106*  BUN  --  19 14 15 19 21 21 17 14   CREATININE  --  0.52 0.55 0.62 0.73 0.65 0.77 0.71 0.78  CALCIUM  --  8.6* 9.1 9.4 9.4 9.0 9.3 9.1 9.1  MG 2.3  --   --   --   --   --   --   --   --   PHOS  --  2.5 3.1 3.9 3.4  --  3.1 2.4* 2.7    CBC: Recent Labs  Lab 12/06/23 1054 12/07/23 0423 12/08/23 0418  WBC 15.3* 9.8 6.6  HGB 12.7 12.2 12.2  HCT 36.8 35.9* 36.0  MCV 80.7 81.4 81.6  PLT 442* 428* 383    CBG: Recent Labs  Lab 12/08/23 1145 12/08/23 1522 12/08/23 2005 12/08/23 2312 12/09/23 0322  GLUCAP 109* 158* 98 131* 114*   Family history.  Mother with thyroid  disease and anxiety disorder.  Father with skin cancer.  Denies any colon cancer or esophageal cancer or rectal cancer  Brief HPI:   Kaitlyn Franko is a 63 y.o. right-handed female with history significant for polycystic ovarian syndrome anxiety tobacco use class I obesity with BMI 29.80.  Per chart review lives with family.  1 level home one-step to enter.  Independent prior to admission working full-time.  Plans to stay with brother and sister-in-law on discharge.  Presented 11/20/2023 with headache and altered mental status while working out in the yard.  Cranial CT scan showed diffuse basilar subarachnoid hemorrhage with intraventricular extension and early ventriculomegaly, greatest along the left cerebellar pontine angle cistern.  CTA showed no definite aneurysm or vascular malformation.  Abrupt tapering of the V4 segment of both vertebral arteries of the distal basilar artery concerning for early vasospasm.  Patient was loaded with Keppra .  EEG  suggestive of moderate to severe diffuse encephalopathy without seizure.  Admission chemistries unremarkable except WBC  14,700 potassium 3.2 glucose 179 urine drug screen negative.  Underwent right frontal ventricular catheter placement 11/20/2023 for early obstructive hydrocephalus per Dr. Gillie.  Cerebral angiogram aborted early due to agitation and restlessness showing no aneurysm AVMs or fistulas with repeat angiogram 11/29/2023 showing no intracranial aneurysm AVM or fistula.  Patient had episode of increased altered mental status and shaking 12/06/2023 initially nonresponsive not following commands had CT scan completed was negative EEG repeated showing mild to moderate diffuse encephalopathy again without seizure patient was loaded with Keppra .  She did spike a fever with mild leukocytosis 15,300 question related encephalopathy CFS cultures no growth to date findings of E. coli UTI resistant to ampicillin intermediate to Unasyn completing course of Ancef  and latest WBC 6600 and follow-up urinalysis showed negative nitrite with rare bacteria and fever resolved.  Placed on Bactrim  DS 1 tablet every 12 hours 731 2025 x 10 doses.  Chest x-ray showed no acute process.  7 mm nodular density in the right medial lung base advised outpatient follow-up.  Flomax  and Urecholine  were added for some urinary retention after initial Foley tube removed.  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  She was placed on restricted free water as well as salt tablets.  A nasogastric tube initially in place for nutritional support diet advanced to regular.  Lovenox  initiated for DVT prophylaxis.  Topamax  ongoing for bouts of headache.  She did remain on Nimotop  60 mg every 4 hours.  Neurosurgery note 7/28 indicates plan for 21-day treatment.  Therapy evaluations completed due to patient decreased functional mobility was admitted for a  comprehensive rehab program.   Hospital Course: Sianna Garofano was admitted to rehab 12/09/2023 for inpatient therapies to consist of PT, ST and OT at least three hours five days a week. Past admission physiatrist, therapy team and rehab RN have worked together to provide customized collaborative inpatient rehab.  Pertaining to patient's nontraumatic subarachnoid hemorrhage with hydrocephalus status post EVD removed 7/27 follow-up neurosurgery.  Subcutaneous Lovenox  for DVT prophylaxis no bleeding episodes.  Patient with intermittent headaches initially on Topamax  discontinued due to bouts of urinary retention currently maintained on Fioricet  as well as oxycodone  for breakthrough pain.  Seizure prophylaxis with Keppra  500 mg twice daily EEG negative.  Vasospasm prevention with Nimodipine  60 mg every 4 hours until 12/11/2023 and stop.  Hospital course bouts of fever E. coli UTI completed course of antibiotic therapy blood cultures no growth to date follow-up urine study again negative placed on Bactrim  DS empirically 12/08/2023 for possible reoccurrence of UTI with COVID and RVP results pending.  Bouts of urinary retention with Flomax  0.8 mg daily and Urecholine  as indicated monitoring of PVRs.  Renal ultrasound was unremarkable.  Hospital course incidental findings of a 7 mm nodular density in the medial right lung base follow-up outpatient.  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  History of tobacco use NicoDerm patch receiving counts regards to cessation of nicotine  products.  Class I obesity BMI 29.80 dietary follow-up.   Blood pressures were monitored on TID basis and remained controlled and monitored     Rehab course: During patient's stay in rehab weekly team conferences were held to monitor patient's progress, set goals and discuss barriers to  discharge. At admission, patient required mod assist 5 feet rolling walker moderate assist step pivot  transfers  He/She  has had improvement in activity tolerance, balance, postural control as well as ability to compensate for deficits. He/She has had improvement in functional use RUE/LUE  and RLE/LLE as well as improvement in awareness.  Completes bed mobility with supervision/modified independent, completes transfers with supervision/contact-guard, ambulates without assist device supervision/contact-guard 300 feet controlled environment household environment and community environment.  Ambulates up and down steps with bilateral handrails with supervision.  Full body bathing with supervision.  Minimal cues for seated rinsing/drying to decrease fall risk.  Patient dresses with supervision, dependent for donning bilateral support hose.  SLP follow-up patient with excellent progress evidenced by improved attention, short-term memory, orientation, awareness and problem-solving.  Full family teaching completed plan discharge to home       Disposition:  There are no questions and answers to display.         Diet: Regular  Special Instructions: No driving smoking or alcohol  Follow-up outpatient for incidental finding of 7 mm nodular density in the medial right lung base  Medications at discharge 1.  Tylenol  as needed 2.  Fioricet  1 tab every 6 hours as needed pain/headache 3.  Keppra  500 mg p.o. twice daily 4.  NicoDerm patch taper as directed 5.  Oxycodone  5 mg every 4 hours as needed pain 6.  Protonix  40 mg p.o. daily 7.  Flomax  0.8 mg daily 8.  Neurontin  100 mg 3 times daily 9.  Lidoderm  patch changes directed 10.  ProAmatine  5 mg 3 times daily 11.  Urecholine  10 mg 3 times daily 12.  Melatonin 5 mg nightly as needed sleep 13.  Sodium chloride  tablet 1 g twice daily   30-35 minutes were spent completing discharge summary and discharge planning     Follow-up Information      Emeline Joesph BROCKS, DO Follow up.   Specialty: Physical Medicine and Rehabilitation Why: Office to call for appointment Contact information: [address] [place] [address] [phone]         Lanis Pupa, MD Follow up.   Specialty: Neurosurgery Why: Call for appointment Contact information: [address] [place] [address] [phone]                 Signed: Toribio JINNY Pitch 12/09/2023, 7:38 PM

## 2023-12-09 NOTE — TOC Transition Note (Signed)
 Transition of Care [facility]) - Discharge Note   Patient Details  Name: Andrea Bridges MRN: [id number] Date of Birth: 03/29/1961  [facility] CM/SW Contact:  Andrez JULIANNA George, RN Phone Number: 12/09/2023, 12:48 PM   Clinical Narrative:     Pt is discharging to CIR today. IP Care management signing off.   Final next level of care: IP Rehab Facility Barriers to Discharge: No Barriers Identified   Patient Goals and CMS Choice Patient states their goals for this hospitalization and ongoing recovery are:: to go home CMS Medicare.gov Compare Post Acute Care list provided to:: Patient Choice offered to / list presented to : Patient      Discharge Placement                       Discharge Plan and Services Additional resources added to the After Visit Summary for     Discharge Planning Services: CM Consult Post Acute Care Choice: NA          DME Arranged: N/A         HH Arranged: NA          Social Drivers of Health (SDOH) Interventions SDOH Screenings   Food Insecurity: Patient Unable To Answer (11/20/2023)  Housing: Patient Unable To Answer (11/20/2023)  Transportation Needs: Patient Unable To Answer (11/20/2023)  Utilities: Patient Unable To Answer (11/20/2023)  Depression (PHQ2-9): Low Risk  (09/22/2023)  Social Connections: Unknown (09/22/2021)   Received from [facility]  Tobacco Use: High Risk (12/02/2023)     Readmission Risk Interventions    11/21/2023    4:34 PM  Readmission Risk Prevention Plan  Post Dischage Appt Complete  Medication Screening Complete  Transportation Screening Complete

## 2023-12-09 NOTE — Plan of Care (Signed)
  Problem: Education: Goal: Knowledge of disease or condition will improve Outcome: Progressing Goal: Knowledge of secondary prevention will improve (MUST DOCUMENT ALL) Outcome: Progressing Goal: Knowledge of patient specific risk factors will improve (DELETE if not current risk factor) Outcome: Progressing   Problem: Spontaneous Subarachnoid Hemorrhage Tissue Perfusion: Goal: Complications of Spontaneous Subarachnoid Hemorrhage will be minimized Outcome: Progressing

## 2023-12-09 NOTE — H&P (Addendum)
 Physical Medicine and Rehabilitation Admission H&P       HPI: Andrea Bridges is a 63 year old right-handed female with history significant for polycystic ovarian syndrome, anxiety, tobacco use, class I obesity with BMI 29.80.  Per chart review patient lives with family.  1 level home with one-step to enter.  Independent prior to admission working full-time.  Plans to stay with her brother and sister-in-law on discharge at 24/7 assist as needed as well as good support from extended family.  Presented 11/20/2023 with headache and altered mental status while working out in the yard.  Cranial CT scan showed diffuse basilar subarachnoid hemorrhage with intraventricular extension and early ventriculomegaly, greatest along the left cerebellopontine angle cistern.  CTA showed no definite aneurysm or vascular malformation.  Abrupt tapering of the V4 segment of both vertebral arteries of the distal basilar artery concerning for early vasospasm.  Patient was loaded with Keppra .  EEG suggestive of moderate to severe diffuse encephalopathy without seizure.  Admission chemistries unremarkable except WBC 14,700, potassium 3.2, glucose 179, urine drug screen negative.  Underwent right frontal ventricular catheter placement 11/20/2023 for early obstructive hydrocephalus per Dr. Gillie.  Cerebral angiogram aborted early due to agitation and restlessness showing no aneurysms AVMs or fistulas with repeat angiogram 11/29/2023 showing no intracranial aneurysms AVM or fistula..  Patient had episode of increased altered mental status and shaking 12/06/2023 initially nonresponsive not following commands head CT completed it was negative EEG repeated showing mild to moderate diffuse encephalopathy again without seizure and recommendations were to continue Keppra .  Patient did spike a fever with mild leukocytosis 15,300 question related to encephalopathy CSF cultures no growth to date findings of E. coli UTI resistant to ampicillin  intermediate to Unasyn completing course of Ancef  and latest white count 6600 and follow-up urinalysis showed negative nitrite with rare bacteria and fever had resolved..  Placed on Bactrim  DS 1 tablet every 12 hours 731 2025 x 10 doses.  Chest x-ray showed no acute cardiopulmonary process.  7 mm nodular density in the medial right lung base.  Flomax  and Urecholine  were added for some urinary retention after initial Foley tube removed.  Foley has been restarted.  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  She was placed on restricted free water as well as salt tablets.  A nasogastric tube initially and placed for nutritional support her diet has been advanced to regular.  Patient was cleared to begin Lovenox  for DVT prophylaxis.  Topamax  ongoing for bouts of headaches.  She does remain on Nimotop  60 mg every 4 hours.  Neurosurgery note 7/28 indicates plan for 21 days of treatment.  Therapy evaluations completed due to patient decreased functional mobility was admitted for a comprehensive rehab program.   Review of Systems  Constitutional:  Negative for chills and fever.  HENT:  Negative for hearing loss.   Eyes:  Negative for blurred vision and double vision.  Respiratory:  Negative for cough.        Occasional shortness of breath with exertion  Cardiovascular:  Negative for chest pain, palpitations and leg swelling.  Gastrointestinal:  Negative for abdominal pain, constipation, diarrhea, nausea and vomiting.  Genitourinary:        Foley in place  Musculoskeletal:  Positive for myalgias.  Skin:  Negative for rash.  Neurological:  Positive for speech change, weakness and headaches. Negative for sensory change.  Psychiatric/Behavioral:  Negative for depression.  Anxiety  All other systems reviewed and are negative.      Past Medical History:  Diagnosis Date   Acute appendicitis with perforation  and localized peritonitis, without abscess or gangrene 12/11/2019   Anxiety     Infertility, female     PCOS (polycystic ovarian syndrome)     Skin cancer      basal cell   SOBOE (shortness of breath on exertion) 09/08/2022             Past Surgical History:  Procedure Laterality Date   APPENDECTOMY   2021   BREAST BIOPSY       IR ANGIO INTRA EXTRACRAN SEL INTERNAL CAROTID BILAT MOD SED   11/29/2023   IR ANGIO VERTEBRAL SEL VERTEBRAL BILAT MOD SED   11/29/2023   IR ANGIO VERTEBRAL SEL VERTEBRAL UNI R MOD SED   11/22/2023   IR US  GUIDE VASC ACCESS RIGHT   11/29/2023             Family History  Problem Relation Age of Onset   Thyroid  disease Mother     Anxiety disorder Mother     Obesity Mother     Cancer Father          skin   Sleep apnea Father          Social History:  reports that she has been smoking cigarettes and e-cigarettes. She has never used smokeless tobacco. She reports that she does not currently use alcohol. She reports that she does not use drugs. Allergies:  Allergies  No Known Allergies         Medications Prior to Admission  Medication Sig Dispense Refill   ibuprofen (ADVIL) 200 MG tablet Take 400 mg by mouth every 6 (six) hours as needed for moderate pain (pain score 4-6).       Semaglutide -Weight Management (WEGOVY ) 1.7 MG/0.75ML SOAJ Inject 1.7 mg into the skin once a week. 3 mL 1   Vitamin D , Ergocalciferol , (DRISDOL ) 1.25 MG (50000 UNIT) CAPS capsule Take 1 capsule (50,000 Units total) by mouth every 7 (seven) days. 12 capsule 0              Home: Home Living Family/patient expects to be discharged to:: Private residence Living Arrangements: Children Available Help at Discharge: Family, Friend(s), Available PRN/intermittently Type of Home: House Home Access: Stairs to enter [employer] of Steps: one step down, two steps up Entrance Stairs-Rails: None Home Layout: One level Bathroom Shower/Tub: Psychologist, counselling, Hydrographic surveyor,  Sport and exercise psychologist: Standard Home Equipment: None  Lives With: Spouse   Functional History: Prior Function Prior Level of Function : Independent/Modified Independent, Working/employed, Driving Mobility Comments: indep, no AD ADLs Comments: indep, works full time   Functional Status:  Mobility: Bed Mobility Overal bed mobility: Needs Assistance Bed Mobility: Rolling, Sidelying to Sit, Sit to Supine Rolling: Total assist Sidelying to sit: Total assist Supine to sit: Mod assist Sit to supine: Total assist Sit to sidelying: Max assist General bed mobility comments: very minimal activation or attempt to assist this date Transfers Overall transfer level: Needs assistance Equipment used: Rolling walker (2 wheels) Transfers: Sit to/from Stand Sit to Stand: Mod assist Bed to/from chair/wheelchair/BSC transfer type:: Step pivot Step pivot transfers: Mod assist, +2 safety/equipment General transfer comment: unable to progress beyond EOB Ambulation/Gait Ambulation/Gait assistance: Mod assist Gait Distance (Feet): 5 Feet Assistive device: Rolling walker (2 wheels) Gait Pattern/deviations: Step-through pattern, Decreased stride length, Trunk flexed, Knees buckling General Gait Details: Cues for  sequencing, increased step length, and RW proximity. Assist for RW management and balance. Pts knees noted to increase flexion with increased fatigue with pt unable to achieve extension requiring cues for seated rest. Increased effort to advance BLE Gait velocity: Decreased Gait velocity interpretation: <1.8 ft/sec, indicate of risk for recurrent falls Pre-gait activities: Marching in place w/ RW   ADL: ADL Overall ADL's : Needs assistance/impaired Eating/Feeding: Independent Grooming: Maximal assistance Upper Body Bathing: Moderate assistance, Sitting Lower Body Bathing: Total assistance Upper Body Dressing : Maximal assistance Lower Body Dressing: Total assistance Lower Body Dressing  Details (indicate cue type and reason): total A for socks Toilet Transfer: Total assistance Toilet Transfer Details (indicate cue type and reason): bed level Toileting- Clothing Manipulation and Hygiene: Total assistance, Sit to/from stand Functional mobility during ADLs: Total assistance General ADL Comments: pt is now total A for all aspects of self care/mobility and sitting balance.   Cognition: Cognition Overall Cognitive Status: Impaired/Different from baseline Arousal/Alertness: Awake/alert Orientation Level: Oriented X4 Year: 2025 Month: July Day of Week: Incorrect Attention: Sustained Sustained Attention: Impaired Sustained Attention Impairment: Verbal basic, Verbal complex Memory: Impaired Memory Impairment: Storage deficit, Retrieval deficit Awareness: Impaired Awareness Impairment: Intellectual impairment, Emergent impairment Problem Solving: Impaired Problem Solving Impairment: Verbal complex, Functional complex Executive Function: Self Correcting Initiating: Impaired Initiating Impairment: Verbal basic, Verbal complex Self Correcting: Impaired Self Correcting Impairment: Functional complex, Functional basic, Verbal complex Safety/Judgment: Impaired Comments: patient requiring cues to refrain from scratching head at surgical site and from pulling on Cortrak feeding tube Cognition Arousal: Alert Behavior During Therapy: Flat affect Overall Cognitive Status: Impaired/Different from baseline   Physical Exam: Blood pressure 116/71, pulse 64, temperature (!) 97.5 F (36.4 C), temperature source Axillary, resp. rate 18, height 5' 7 (1.702 m), weight 86.3 kg, SpO2 100%.     General: No apparent distress, laying in bed HEENT: Cranial incision with staples intact, CDI.  MMM Neck: Supple without JVD or lymphadenopathy Heart: Reg rate and rhythm. No murmurs rubs or gallops Chest: CTA bilaterally without wheezes, rales, or rhonchi; no distress Abdomen: Soft, non-tender,  non-distended, bowel sounds positive. Extremities: No clubbing, cyanosis, or edema. Pulses are 2+ Psych: Appropriate, cooperative  Skin: Clean and intact without signs of breakdown, bruising in UE GU: Foley with clear yellow urine Neuro:   Mental Status: Alert and oriented x4,  Following commands. Recalls 0/3 words 5 min later.  Speech/Languate: A little delayed responses. Can name and repeat. CRANIAL NERVES:   II: PERRL. Visual fields full III, IV, VI: EOM intact, no gaze preference or deviation V: normal sensation bilaterally VII: no asymmetry VIII: normal hearing to speech IX, X: normal palatal elevation XI: 5/5 head turn and 5/5 shoulder shrug bilaterally XII: Tongue midline       MOTOR: RUE: 4+/5 Deltoid, 4+/5 Biceps, 4+/5 Triceps,4+/5 Grip LUE: 4+/5 Deltoid, 4+/5 Biceps, 4+/5 Triceps, 4+/5 Grip RLE: HF 4/5, KE 5/5, ADF 4+/5, APF 4+/5 LLE: HF 4/5, KE 5/5, ADF 4+/5, APF 4+/5   REFLEXES: DTR 2+ patella and Achilles bilaterally, 2-3 beats of clonus b/l ankles   SENSORY: Normal to touch all 4 extremities   MSK: no joint swelling noted     Lab Results Last 48 Hours        Results for orders placed or performed during the hospital encounter of 11/20/23 (from the past 48 hours)  Glucose, capillary     Status: Abnormal    Collection Time: 12/07/23  7:54 AM  Result Value Ref Range  Glucose-Capillary 188 (H) 70 - 99 mg/dL      Comment: Glucose reference range applies only to samples taken after fasting for at least 8 hours.  Glucose, capillary     Status: Abnormal    Collection Time: 12/07/23 11:50 AM  Result Value Ref Range    Glucose-Capillary 114 (H) 70 - 99 mg/dL      Comment: Glucose reference range applies only to samples taken after fasting for at least 8 hours.  Glucose, capillary     Status: Abnormal    Collection Time: 12/07/23  3:06 PM  Result Value Ref Range    Glucose-Capillary 135 (H) 70 - 99 mg/dL      Comment: Glucose reference range applies only to  samples taken after fasting for at least 8 hours.  Glucose, capillary     Status: Abnormal    Collection Time: 12/07/23  8:21 PM  Result Value Ref Range    Glucose-Capillary 113 (H) 70 - 99 mg/dL      Comment: Glucose reference range applies only to samples taken after fasting for at least 8 hours.    Comment 1 Notify RN    Glucose, capillary     Status: Abnormal    Collection Time: 12/07/23 11:45 PM  Result Value Ref Range    Glucose-Capillary 106 (H) 70 - 99 mg/dL      Comment: Glucose reference range applies only to samples taken after fasting for at least 8 hours.    Comment 1 Notify RN    Renal function panel     Status: Abnormal    Collection Time: 12/08/23  4:18 AM  Result Value Ref Range    Sodium 133 (L) 135 - 145 mmol/L    Potassium 3.5 3.5 - 5.1 mmol/L    Chloride 103 98 - 111 mmol/L    CO2 22 22 - 32 mmol/L    Glucose, Bld 104 (H) 70 - 99 mg/dL      Comment: Glucose reference range applies only to samples taken after fasting for at least 8 hours.    BUN 17 8 - 23 mg/dL    Creatinine, Ser 9.28 0.44 - 1.00 mg/dL    Calcium 9.1 8.9 - 89.6 mg/dL    Phosphorus 2.4 (L) 2.5 - 4.6 mg/dL    Albumin 3.0 (L) 3.5 - 5.0 g/dL    GFR, Estimated >39 >39 mL/min      Comment: (NOTE) Calculated using the CKD-EPI Creatinine Equation (2021)      Anion gap 8 5 - 15      Comment: Performed at [facility], [address] [place], [place] [address]  CBC     Status: None    Collection Time: 12/08/23  4:18 AM  Result Value Ref Range    WBC 6.6 4.0 - 10.5 K/uL    RBC 4.41 3.87 - 5.11 MIL/uL    Hemoglobin 12.2 12.0 - 15.0 g/dL    HCT 63.9 63.9 - 53.9 %    MCV 81.6 80.0 - 100.0 fL    MCH 27.7 26.0 - 34.0 pg    MCHC 33.9 30.0 - 36.0 g/dL    RDW 85.3 88.4 - 84.4 %    Platelets 383 150 - 400 K/uL    nRBC 0.0 0.0 - 0.2 %      Comment: Performed at [facility], [address] [place], [place] [address]  Glucose, capillary     Status: Abnormal    Collection Time: 12/08/23  4:36 AM  Result Value Ref Range    Glucose-Capillary 111 (H) 70 - 99 mg/dL      Comment: Glucose reference range applies only to samples taken after fasting for at least 8 hours.    Comment 1 Notify RN    Glucose, capillary     Status: Abnormal    Collection Time: 12/08/23  7:44 AM  Result Value Ref Range    Glucose-Capillary 126 (H) 70 - 99 mg/dL      Comment: Glucose reference range applies only to samples taken after fasting for at least 8 hours.  Glucose, capillary     Status: Abnormal    Collection Time: 12/08/23 11:45 AM  Result Value Ref Range    Glucose-Capillary 109 (H) 70 - 99 mg/dL      Comment: Glucose reference range applies only to samples taken after fasting for at least 8 hours.  Urinalysis, Routine w reflex microscopic -Urine, Catheterized     Status: Abnormal    Collection Time: 12/08/23  3:15 PM  Result Value Ref Range    Color, Urine YELLOW YELLOW    APPearance CLEAR CLEAR    Specific Gravity, Urine 1.016 1.005 - 1.030    pH 5.0 5.0 - 8.0    Glucose, UA NEGATIVE NEGATIVE mg/dL    Hgb urine dipstick NEGATIVE NEGATIVE    Bilirubin Urine NEGATIVE NEGATIVE    Ketones, ur NEGATIVE NEGATIVE mg/dL    Protein, ur NEGATIVE NEGATIVE mg/dL    Nitrite NEGATIVE NEGATIVE    Leukocytes,Ua SMALL (A) NEGATIVE    RBC / HPF 0-5 0 - 5 RBC/hpf    WBC, UA 11-20 0 - 5 WBC/hpf    Bacteria, UA RARE (A) NONE SEEN    Squamous Epithelial / HPF 0-5 0 - 5 /HPF    Mucus PRESENT        Comment: Performed at [facility], [address] [place], [place] [address]  Glucose, capillary     Status: Abnormal    Collection Time: 12/08/23  3:22 PM  Result Value Ref Range    Glucose-Capillary 158 (H) 70 - 99 mg/dL      Comment: Glucose reference range applies only to samples taken after fasting for at least 8 hours.  Glucose, capillary     Status: None    Collection Time: 12/08/23  8:05 PM  Result Value Ref Range    Glucose-Capillary 98 70 - 99 mg/dL      Comment: Glucose reference  range applies only to samples taken after fasting for at least 8 hours.    Comment 1 Notify RN      Comment 2 Document in Chart    Glucose, capillary     Status: Abnormal    Collection Time: 12/08/23 11:12 PM  Result Value Ref Range    Glucose-Capillary 131 (H) 70 - 99 mg/dL      Comment: Glucose reference range applies only to samples taken after fasting for at least 8 hours.  Glucose, capillary     Status: Abnormal    Collection Time: 12/09/23  3:22 AM  Result Value Ref Range    Glucose-Capillary 114 (H) 70 - 99 mg/dL      Comment: Glucose reference range applies only to samples taken after fasting for at least 8 hours.    Comment 1 Notify RN      Comment 2 Document in Chart    Renal function panel     Status: Abnormal    Collection  Time: 12/09/23  4:06 AM  Result Value Ref Range    Sodium 134 (L) 135 - 145 mmol/L    Potassium 3.4 (L) 3.5 - 5.1 mmol/L    Chloride 104 98 - 111 mmol/L    CO2 20 (L) 22 - 32 mmol/L    Glucose, Bld 106 (H) 70 - 99 mg/dL      Comment: Glucose reference range applies only to samples taken after fasting for at least 8 hours.    BUN 14 8 - 23 mg/dL    Creatinine, Ser 9.21 0.44 - 1.00 mg/dL    Calcium 9.1 8.9 - 89.6 mg/dL    Phosphorus 2.7 2.5 - 4.6 mg/dL    Albumin 2.9 (L) 3.5 - 5.0 g/dL    GFR, Estimated >39 >39 mL/min      Comment: (NOTE) Calculated using the CKD-EPI Creatinine Equation (2021)      Anion gap 10 5 - 15      Comment: Performed at [facility], [address] [place], [place] [address]       Imaging Results (Last 48 hours)  DG CHEST PORT 1 VIEW Result Date: 12/08/2023 CLINICAL DATA:  Fever EXAM: PORTABLE CHEST 1 VIEW COMPARISON:  Chest x-ray 11/26/2023 FINDINGS: The heart size and mediastinal contours are within normal limits. There is a 7 mm nodular density in the medial right lung base. The lungs are otherwise clear. There is no pleural effusion or pneumothorax. The visualized skeletal structures are unremarkable. IMPRESSION:  1. No acute cardiopulmonary process. 2. 7 mm nodular density in the medial right lung base. Recommend further evaluation with chest CT. Electronically Signed   By: Greig Pique M.D.   On: 12/08/2023 15:42           Blood pressure 116/71, pulse 64, temperature (!) 97.5 F (36.4 C), temperature source Axillary, resp. rate 18, height 5' 7 (1.702 m), weight 86.3 kg, SpO2 100%.   Medical Problem List and Plan: 1. Functional deficits secondary to nontraumatic subarachnoid hemorrhage with hydrocephalus status post EVD removed 7/27             -patient may shower, cover incision please             -ELOS/Goals: 10-14 days, PT/OT/SLP sup             -Admit to CIR 2.  Antithrombotics: -DVT/anticoagulation:  Pharmaceutical: Lovenox              -antiplatelet therapy: N/A 3. Pain Management: Topamax  25 mg twice daily, Fioricet  as needed, oxycodone  5 mg every 4 hours as needed pain 4. Mood/Behavior/Sleep: Provide emotional support             -antipsychotic agents: N/A 5. Neuropsych/cognition: This patient is not capable of making decisions on her own behalf. 6. Skin/Wound Care: Routine skin checks 7. Fluids/Electrolytes/Nutrition: Routine in and outs with follow-up chemistries 8.  Seizure prophylaxis.  Keppra  500 mg twice daily.  EEG negative 9.  Fevers/E. coli UTI/urinary retention.  7-day course Ancef  completed.  Blood cultures/CSF no growth to date and placed on Bactrim  DS 1 tablet every 12 hours x 10 doses 12/08/2023 for possible UTI.  Currently on airborne and contact precautions pending COVID/RVP results.  Continue Flomax  0.8 mg daily, Urecholine  10 mg 3 times daily.   - Consider voiding trial in 2 to 3 days 10.  Incidental finding of 7 mm nodular density in the medial right lung base.  Follow-up outpatient. 11.  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  Sodium 131-133.  Serum osmolality 289 urine osmolality 740 urine sodium 80.  Continue salt tablets 2 g 3 times daily.  Avoid hypotonic fluids. 12.  Vasospasm  Prevention.  Nimodipine  60 mg every 4 hours. NSGY note indicates for 21 days (end date 8/3) 13.  Tobacco abuse.  NicoDerm patch.  Provide counseling 14.  Overweight/Class I obesity.  Last BMI 29.80.  Dietary follow-up       Toribio JINNY Pitch, PA-C 12/09/2023   I have personally performed a face to face diagnostic evaluation of this patient and formulated the key components of the plan.  Additionally, I have personally reviewed laboratory data, imaging studies, as well as relevant notes and concur with the physician assistant's documentation above.   The patient's status has not changed from the original H&P.  Any changes in documentation from the acute care chart have been noted above.   Murray Collier, MD

## 2023-12-10 MED ORDER — ORAL CARE MOUTH RINSE: 15.0000 mL | OROMUCOSAL | Status: DC | PRN

## 2023-12-10 MED ORDER — MIDODRINE HCL 5 MG PO TABS
2.5000 mg | ORAL_TABLET | Freq: Three times a day (TID) | ORAL | Status: DC | PRN
  Administered 2023-12-11: 2.5 mg via ORAL
  Filled 2023-12-10: qty 1

## 2023-12-10 MED ORDER — POTASSIUM CHLORIDE CRYS ER 20 MEQ PO TBCR
40.0000 meq | EXTENDED_RELEASE_TABLET | Freq: Once | ORAL | Status: AC
  Administered 2023-12-10: 40 meq via ORAL
  Filled 2023-12-10: qty 2

## 2023-12-10 NOTE — Evaluation (Signed)
 Occupational Therapy Assessment and Plan  Patient Details  Name: Andrea Bridges MRN: [id number] Date of Birth: 1961/01/02  OT Diagnosis: acute pain, altered mental status, lumbago (low back pain), muscle weakness (generalized), pain in joint, and decreased balance strategies and activity tolerance Rehab Potential: Rehab Potential (ACUTE ONLY): Good ELOS: 7-10 days   Today's Date: 12/10/2023 OT Individual Time: 9184-9069 OT Individual Time Calculation (min): 75 min     Hospital Problem: Principal Problem:   Nontraumatic subcortical hemorrhage of left cerebral hemisphere [facility])   Past Medical History:  Past Medical History:  Diagnosis Date   Acute appendicitis with perforation and localized peritonitis, without abscess or gangrene 12/11/2019   Anxiety    Infertility, female    PCOS (polycystic ovarian syndrome)    Skin cancer    basal cell   SOBOE (shortness of breath on exertion) 09/08/2022   Past Surgical History:  Past Surgical History:  Procedure Laterality Date   APPENDECTOMY  2021   BREAST BIOPSY     IR ANGIO INTRA EXTRACRAN SEL INTERNAL CAROTID BILAT MOD SED  11/29/2023   IR ANGIO VERTEBRAL SEL VERTEBRAL BILAT MOD SED  11/29/2023   IR ANGIO VERTEBRAL SEL VERTEBRAL UNI R MOD SED  11/22/2023   IR US  GUIDE VASC ACCESS RIGHT  11/29/2023    Assessment & Plan Clinical Impression: Patient is a 63 year old right-handed female with history significant for polycystic ovarian syndrome, anxiety, tobacco use, class I obesity with BMI 29.80. Per chart review patient lives with family. 1 level home with one-step to enter. Independent prior to admission working full-time. Plans to stay with her brother and sister-in-law on discharge at 24/7 assist as needed as well as good support from extended family. Presented 11/20/2023 with headache and altered mental status while working out in the yard. Cranial CT scan showed diffuse basilar subarachnoid hemorrhage with intraventricular extension and early  ventriculomegaly, greatest along the left cerebellopontine angle cistern. CTA showed no definite aneurysm or vascular malformation. Abrupt tapering of the V4 segment of both vertebral arteries of the distal basilar artery concerning for early vasospasm. Patient was loaded with Keppra . EEG suggestive of moderate to severe diffuse encephalopathy without seizure. Admission chemistries unremarkable except WBC 14,700, potassium 3.2, glucose 179, urine drug screen negative. Underwent right frontal ventricular catheter placement 11/20/2023 for early obstructive hydrocephalus per Dr. Gillie. Cerebral angiogram aborted early due to agitation and restlessness showing no aneurysms AVMs or fistulas with repeat angiogram 11/29/2023 showing no intracranial aneurysms AVM or fistula.. Patient had episode of increased altered mental status and shaking 12/06/2023 initially nonresponsive not following commands head CT completed it was negative EEG repeated showing mild to moderate diffuse encephalopathy again without seizure and recommendations were to continue Keppra . Patient did spike a fever with mild leukocytosis 15,300 question related to encephalopathy CSF cultures no growth to date findings of E. coli UTI resistant to ampicillin intermediate to Unasyn completing course of Ancef  and latest white count 6600 and follow-up urinalysis showed negative nitrite with rare bacteria and fever had resolved.. Placed on Bactrim  DS 1 tablet every 12 hours 731 2025 x 10 doses. Chest x-ray showed no acute cardiopulmonary process. 7 mm nodular density in the medial right lung base. Flomax  and Urecholine  were added for some urinary retention after initial Foley tube removed. Foley has been restarted. 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 She was placed on restricted free water as well as salt tablets. A nasogastric tube initially and placed  for  nutritional support her diet has been advanced to regular. Patient was cleared to begin Lovenox  for DVT prophylaxis. Topamax  ongoing for bouts of headaches. She does remain on Nimotop  60 mg every 4 hours. Neurosurgery note 7/28 indicates plan for 21 days of treatment. Patient transferred to CIR on 12/09/2023 .    Patient currently requires supervision-CGA with basic self-care skills secondary to muscle weakness, decreased cardiorespiratoy endurance, decreased attention, decreased awareness, decreased safety awareness, and decreased memory, and decreased standing balance and decreased balance strategies.  Prior to hospitalization, patient could complete BADLs/IADLs independently.   Patient will benefit from skilled intervention to increase independence with basic self-care skills prior to discharge home with care partner.  Anticipate patient will require 24 hour supervision and follow up outpatient.  OT - End of Session Activity Tolerance: Tolerates < 10 min activity with changes in vital signs Endurance Deficit: Yes OT Assessment Rehab Potential (ACUTE ONLY): Good OT Barriers to Discharge: Decreased caregiver support;Wound Care OT Patient demonstrates impairments in the following area(s): Balance;Cognition;Endurance;Pain;Safety OT Basic ADL's Functional Problem(s): Bathing;Dressing;Toileting OT Transfers Functional Problem(s): Toilet;Tub/Shower OT Additional Impairment(s): None OT Plan OT Intensity: Minimum of 1-2 x/day, 45 to 90 minutes OT Frequency: 5 out of 7 days OT Duration/Estimated Length of Stay: 7-10 days OT Treatment/Interventions: Balance/vestibular training;Cognitive remediation/compensation;Community reintegration;Discharge planning;Disease mangement/prevention;DME/adaptive equipment instruction;Functional electrical stimulation;Functional mobility training;Neuromuscular re-education;Pain management;Patient/family education;Psychosocial support;Self Care/advanced ADL retraining;Skin  care/wound managment;Splinting/orthotics;Therapeutic Activities;Therapeutic Exercise;UE/LE Strength taining/ROM;Visual/perceptual remediation/compensation;UE/LE Coordination activities OT Basic Self-Care Anticipated Outcome(s): Supervision OT Toileting Anticipated Outcome(s): Supervision OT Bathroom Transfers Anticipated Outcome(s): Supervision OT Recommendation Recommendations for Other Services: Therapeutic Recreation consult Therapeutic Recreation Interventions: Stress management Patient destination: Home Follow Up Recommendations: Outpatient OT Equipment Recommended: To be determined   OT Evaluation Precautions/Restrictions  Precautions Precautions: Fall Recall of Precautions/Restrictions: Impaired Restrictions Weight Bearing Restrictions Per Provider Order: No General Chart Reviewed: Yes Family/Caregiver Present: Yes (Ex-husband) Pain Pain Assessment Pain Scale: 0-10 Pain Score: 7  Pain Type: Acute pain Pain Location: Back Pain Descriptors / Indicators: Sore Pain Intervention(s): Medication (See eMAR) Home Living/Prior Functioning Home Living Family/patient expects to be discharged to:: Private residence Living Arrangements: Other relatives Available Help at Discharge: Family, Friend(s), Available PRN/intermittently (Up to 6 hours per day) Type of Home: House Home Access: Stairs to enter [employer] of Steps: Two STE in front/garage Entrance Stairs-Rails: None Home Layout: One level Bathroom Shower/Tub: Psychologist, counselling, Hydrographic surveyor, Sport and exercise psychologist: Pharmacist, community: Yes  Lives With: Family IADL History Current License: Yes Occupation: Full time employment Type of Occupation: Paramedic work Prior Function Level of Independence: Independent with basic ADLs, Independent with homemaking with ambulation, Independent with gait, Independent with transfers Vocation: Full time employment Vision Baseline Vision/History: 1 Wears  glasses Ability to See in Adequate Light: 1 Impaired Patient Visual Report: No change from baseline Vision Assessment?: No apparent visual deficits Perception  Perception: Within Functional Limits Praxis Praxis: Impaired Praxis Impairment Details: Organization Cognition Cognition Overall Cognitive Status: Impaired/Different from baseline Arousal/Alertness: Awake/alert Orientation Level: Place;Person Memory: Impaired Memory Impairment: Retrieval deficit;Storage deficit;Decreased recall of new information Safety/Judgment: Impaired Brief Interview for Mental Status (BIMS) Repetition of Three Words (First Attempt): 3 Temporal Orientation: Year: Correct Temporal Orientation: Month: Accurate within 5 days Temporal Orientation: Day: Correct Recall: Sock: No, could not recall Recall: Blue: Yes, no cue required Recall: Bed: No, could not recall BIMS Summary Score: 11 Sensation Sensation Light Touch: Appears Intact Coordination Gross Motor Movements are Fluid and Coordinated: No Fine Motor Movements are Fluid and Coordinated: Yes Coordination  and Movement Description: Deficits due to generalized weakness, decreased activity tolerance, and pain. Motor  Motor Motor: Other (comment) Motor - Skilled Clinical Observations: Deficits due to generalized weakness, decreased activity tolerance, and pain.  Trunk/Postural Assessment  Cervical Assessment Cervical Assessment: Within Functional Limits Thoracic Assessment Thoracic Assessment: Within Functional Limits Lumbar Assessment Lumbar Assessment: Within Functional Limits Postural Control Postural Control: Deficits on evaluation Righting Reactions: Decreased/delayed Protective Responses: Decreased/delayed  Balance Balance Balance Assessed: Yes Static Sitting Balance Static Sitting - Balance Support: Feet supported Static Sitting - Level of Assistance: 7: Independent Dynamic Sitting Balance Dynamic Sitting - Balance Support:  During functional activity Dynamic Sitting - Level of Assistance: 6: Modified independent (Device/Increase time) Static Standing Balance Static Standing - Balance Support: Left upper extremity supported Static Standing - Level of Assistance: 5: Stand by assistance;4: Min assist (CGA-Min A) Dynamic Standing Balance Dynamic Standing - Balance Support: Left upper extremity supported;During functional activity Dynamic Standing - Level of Assistance: 5: Stand by assistance;4: Min assist (CGA-Min A) Extremity/Trunk Assessment RUE Assessment RUE Assessment: Exceptions to [facility] Active Range of Motion (AROM) Comments: WFL General Strength Comments: 3+/5 LUE Assessment LUE Assessment: Exceptions to [facility] Active Range of Motion (AROM) Comments: WFL General Strength Comments: 3+/5  Care Tool Care Tool Self Care Eating   Eating Assist Level: Set up assist    Oral Care    Oral Care Assist Level: Set up assist    Bathing   Body parts bathed by patient: Right arm;Left arm;Chest;Abdomen;Front perineal area;Buttocks;Right upper leg;Left upper leg;Right lower leg;Left lower leg;Face     Assist Level: Contact Guard/Touching assist    Upper Body Dressing(including orthotics)   What is the patient wearing?: Bra;Pull over shirt   Assist Level: Supervision/Verbal cueing    Lower Body Dressing (excluding footwear)   What is the patient wearing?: Underwear/pull up;Pants Assist for lower body dressing: Contact Guard/Touching assist    Putting on/Taking off footwear   What is the patient wearing?: Shoes Assist for footwear: Set up assist       Care Tool Toileting Toileting activity   Assist for toileting: Contact Guard/Touching assist     Care Tool Bed Mobility Roll left and right activity        Sit to lying activity        Lying to sitting on side of bed activity         Care Tool Transfers Sit to stand transfer        Chair/bed transfer         Toilet transfer   Assist Level:  Minimal Assistance - Patient > 75%     Care Tool Cognition  Expression of Ideas and Wants Expression of Ideas and Wants: 3. Some difficulty - exhibits some difficulty with expressing needs and ideas (e.g, some words or finishing thoughts) or speech is not clear  Understanding Verbal and Non-Verbal Content Understanding Verbal and Non-Verbal Content: 3. Usually understands - understands most conversations, but misses some part/intent of message. Requires cues at times to understand   Memory/Recall Ability Memory/Recall Ability : That he or she is in a hospital/hospital unit;Current season   Refer to Care Plan for Long Term Goals  SHORT TERM GOAL WEEK 1 OT Short Term Goal 1 (Week 1): STGs=LTGs due to patient's estimated length of stay.  Recommendations for other services: Therapeutic Recreation  Stress management and Outing/community reintegration   Skilled Therapeutic Intervention  Session began with introduction to OT role, OT POC, and general orientation to rehab unit/schedule.  Pt completes full-body sponge-bathing and dressing with levels of assistance noted below. Pt ambulates into/out of bathroom with overall Min A (L HHA), demo's decreased step-length and need to furniture walk due to increased weakness. Pt's vitals assessed post-BM, initially BP=100/74, dropping to BP=89/66 with sit<>stand. LPN made aware, plan to don B TEDs for subsequent sessions. Pt remained sitting in WC, posey belt activated, ex-husband in room.  ADL ADL Eating: Set up;Supervision/safety Where Assessed-Eating: Bed level Grooming: Supervision/safety Where Assessed-Grooming: Sitting at sink Upper Body Bathing: Setup;Supervision/safety Where Assessed-Upper Body Bathing: Edge of bed Lower Body Bathing: Contact guard;Minimal assistance Where Assessed-Lower Body Bathing: Edge of bed Upper Body Dressing: Setup Where Assessed-Upper Body Dressing: Edge of bed Lower Body Dressing: Contact guard;Minimal  assistance Where Assessed-Lower Body Dressing: Edge of bed Toileting: Contact guard Where Assessed-Toileting: Teacher, adult education: Scientific laboratory technician Method: Proofreader: Grab bars Tub/Shower Transfer: Unable to assess [employer] Transfer: Unable to assess Mobility  Transfers Sit to Stand: Minimal Assistance - Patient > 75%;Contact Guard/Touching assist Stand to Sit: Minimal Assistance - Patient > 75%;Contact Guard/Touching assist   Discharge Criteria: Patient will be discharged from OT if patient refuses treatment 3 consecutive times without medical reason, if treatment goals not met, if there is a change in medical status, if patient makes no progress towards goals or if patient is discharged from hospital.  The above assessment, treatment plan, treatment alternatives and goals were discussed and mutually agreed upon: by patient and by family  [name], OTR/L, MSOT  12/10/2023, 9:32 AM

## 2023-12-10 NOTE — Progress Notes (Signed)
 Pillow soaked with fluid noted by nursing staff during medication administration. Daughter at bedside, concerned of just sweating. Patient denies being hot, patient was laying on left side during speech eval. And noted no drooling or tearful eyes during session. MD notified and Neurosurgery consulted.

## 2023-12-10 NOTE — Plan of Care (Signed)
  Problem: RH Cognition - SLP Goal: RH LTG Patient will demonstrate orientation with cues Description:  LTG:  Patient will demonstrate orientation to person/place/time/situation with cues (SLP)   Flowsheets (Taken 12/10/2023 1347) LTG Patient will demonstrate orientation to: Time LTG: Patient will demonstrate orientation using cueing (SLP): Minimal Assistance - Patient > 75%   Problem: RH Problem Solving Goal: LTG Patient will demonstrate problem solving for (SLP) Description: LTG:  Patient will demonstrate problem solving for basic/complex daily situations with cues  (SLP) Flowsheets (Taken 12/10/2023 1347) LTG: Patient will demonstrate problem solving for (SLP): Basic daily situations LTG Patient will demonstrate problem solving for: Minimal Assistance - Patient > 75%   Problem: RH Memory Goal: LTG Patient will use memory compensatory aids to (SLP) Description: LTG:  Patient will use memory compensatory aids to recall biographical/new, daily complex information with cues (SLP) Flowsheets (Taken 12/10/2023 1347) LTG: Patient will use memory compensatory aids to (SLP): Minimal Assistance - Patient > 75%   Problem: RH Attention Goal: LTG Patient will demonstrate this level of attention during functional activites (SLP) Description: LTG:  Patient will will demonstrate this level of attention during functional activites (SLP) Flowsheets (Taken 12/10/2023 1347) Patient will demonstrate during cognitive/linguistic activities the attention type of: Sustained LTG: Patient will demonstrate this level of attention during cognitive/linguistic activities with assistance of (SLP): Minimal Assistance - Patient > 75% Number of minutes patient will demonstrate attention during cognitive/linguistic activities: 10   Problem: RH Awareness Goal: LTG: Patient will demonstrate awareness during functional activites type of (SLP) Description: LTG: Patient will demonstrate awareness during functional activites type of  (SLP) Flowsheets (Taken 12/10/2023 1347) Patient will demonstrate during cognitive/linguistic activities awareness type of: Intellectual LTG: Patient will demonstrate awareness during cognitive/linguistic activities with assistance of (SLP): Minimal Assistance - Patient > 75%

## 2023-12-10 NOTE — Evaluation (Signed)
 Physical Therapy Assessment and Plan  Patient Details  Name: Andrea Bridges MRN: [id number] Date of Birth: 10/07/1960  PT Diagnosis: Coordination disorder, Difficulty walking, Impaired cognition, Impaired sensation, Muscle weakness, and Pain in head  Rehab Potential: Good ELOS: 7 to 10 days   Today's Date: 12/10/2023 PT Individual Time: 8954-8842 PT Individual Time Calculation (min): 72 min    Hospital Problem: Principal Problem:   Nontraumatic subcortical hemorrhage of left cerebral hemisphere [facility])   Past Medical History:  Past Medical History:  Diagnosis Date   Acute appendicitis with perforation and localized peritonitis, without abscess or gangrene 12/11/2019   Anxiety    Infertility, female    PCOS (polycystic ovarian syndrome)    Skin cancer    basal cell   SOBOE (shortness of breath on exertion) 09/08/2022   Past Surgical History:  Past Surgical History:  Procedure Laterality Date   APPENDECTOMY  2021   BREAST BIOPSY     IR ANGIO INTRA EXTRACRAN SEL INTERNAL CAROTID BILAT MOD SED  11/29/2023   IR ANGIO VERTEBRAL SEL VERTEBRAL BILAT MOD SED  11/29/2023   IR ANGIO VERTEBRAL SEL VERTEBRAL UNI R MOD SED  11/22/2023   IR US  GUIDE VASC ACCESS RIGHT  11/29/2023    Assessment & Plan Clinical Impression: Patient is a 63 year old right-handed female with history significant for polycystic ovarian syndrome, anxiety, tobacco use, class I obesity with BMI 29.80. Per chart review patient lives with family. 1 level home with one-step to enter. Independent prior to admission working full-time. Plans to stay with her brother and sister-in-law on discharge at 24/7 assist as needed as well as good support from extended family. Presented 11/20/2023 with headache and altered mental status while working out in the yard. Cranial CT scan showed diffuse basilar subarachnoid hemorrhage with intraventricular extension and early ventriculomegaly, greatest along the left cerebellopontine angle cistern. CTA  showed no definite aneurysm or vascular malformation. Abrupt tapering of the V4 segment of both vertebral arteries of the distal basilar artery concerning for early vasospasm. Patient was loaded with Keppra . EEG suggestive of moderate to severe diffuse encephalopathy without seizure. Admission chemistries unremarkable except WBC 14,700, potassium 3.2, glucose 179, urine drug screen negative. Underwent right frontal ventricular catheter placement 11/20/2023 for early obstructive hydrocephalus per Dr. Gillie. Cerebral angiogram aborted early due to agitation and restlessness showing no aneurysms AVMs or fistulas with repeat angiogram 11/29/2023 showing no intracranial aneurysms AVM or fistula.. Patient had episode of increased altered mental status and shaking 12/06/2023 initially nonresponsive not following commands head CT completed it was negative EEG repeated showing mild to moderate diffuse encephalopathy again without seizure and recommendations were to continue Keppra . Patient did spike a fever with mild leukocytosis 15,300 question related to encephalopathy CSF cultures no growth to date findings of E. coli UTI resistant to ampicillin intermediate to Unasyn completing course of Ancef  and latest white count 6600 and follow-up urinalysis showed negative nitrite with rare bacteria and fever had resolved.. Placed on Bactrim  DS 1 tablet every 12 hours 731 2025 x 10 doses. Chest x-ray showed no acute cardiopulmonary process. 7 mm nodular density in the medial right lung base. Flomax  and Urecholine  were added for some urinary retention after initial Foley tube removed. Foley has been restarted. 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 She was placed on restricted free water as well as salt tablets. A nasogastric tube initially and placed for nutritional support her diet has been advanced to regular.  Patient was cleared to  begin Lovenox  for DVT prophylaxis. Topamax  ongoing for bouts of headaches. She does remain on Nimotop  60 mg every 4 hours. Neurosurgery note 7/28 indicates plan for 21 days of treatment.   Patient transferred to CIR on 12/09/2023 .   Patient currently requires min with mobility secondary to muscle weakness, decreased cardiorespiratoy endurance, decreased coordination, decreased attention to left, and decreased attention, decreased awareness, decreased problem solving, decreased safety awareness, and decreased memory.  Prior to hospitalization, patient was independent  with mobility and lived with Alone (daughter lives at home during summer, away at college during the year [place]) in a House home.  Home access is front entrance: 1 step down onto walkway then 2 steps into house, no rails, garage entrance: 2 steps with no railsStairs to enter.  Patient will benefit from skilled PT intervention to maximize safe functional mobility, minimize fall risk, and decrease caregiver burden for planned discharge home with 24 hour supervision.  Anticipate patient will benefit from follow up HH at discharge.  PT - End of Session Activity Tolerance: Tolerates 30+ min activity with multiple rests Endurance Deficit: Yes PT Assessment Rehab Potential (ACUTE/IP ONLY): Good PT Barriers to Discharge: Decreased caregiver support;Inaccessible home environment PT Patient demonstrates impairments in the following area(s): Balance;Endurance;Motor;Perception;Safety;Sensory PT Transfers Functional Problem(s): Bed Mobility;Bed to Chair;Car PT Locomotion Functional Problem(s): Ambulation;Stairs PT Plan PT Intensity: Minimum of 1-2 x/day ,45 to 90 minutes PT Frequency: 5 out of 7 days PT Duration Estimated Length of Stay: 7 to 10 days PT Treatment/Interventions: Ambulation/gait training;Balance/vestibular training;Cognitive remediation/compensation;DME/adaptive equipment instruction;Discharge planning;Functional mobility  training;Neuromuscular re-education;Patient/family education;Stair training;Splinting/orthotics;Therapeutic Activities;Therapeutic Exercise;UE/LE Strength taining/ROM;UE/LE Coordination activities;Wheelchair propulsion/positioning PT Transfers Anticipated Outcome(s): S transfers PT Locomotion Anticipated Outcome(s): S gait and S stairs PT Recommendation Recommendations for Other Services: None Follow Up Recommendations: Home health PT Patient destination: Home Equipment Recommended: To be determined   PT Evaluation Precautions/Restrictions Precautions Precautions: Fall Recall of Precautions/Restrictions: Impaired Precaution/Restrictions Comments: SBP <160 Restrictions Weight Bearing Restrictions Per Provider Order: No General Chart Reviewed: Yes Family/Caregiver Present: Yes (daughter (19))  Pain Pt c/o 6/10 headache.  Pain Interference Pain Interference Pain Effect on Sleep: 1. Rarely or not at all Pain Interference with Therapy Activities: 2. Occasionally Pain Interference with Day-to-Day Activities: 2. Occasionally Home Living/Prior Functioning Home Living Available Help at Discharge: Family;Friend(s) Type of Home: House Home Access: Stairs to enter [employer] of Steps: front entrance: 1 step down onto walkway then 2 steps into house, no rails, garage entrance: 2 steps with no rails Entrance Stairs-Rails: None Home Layout: One level Additional Comments: plan to move in with borhter and sister in law, 2 atory home, 2 steps with rails to enter  Lives With: Alone (daughter lives at home during summer, away at college during the year [place]) Prior Function Level of Independence: Independent with basic ADLs;Independent with gait;Independent with homemaking with ambulation;Independent with transfers  Able to Take Stairs?: Yes Driving: Yes Vocation: Full time employment Vision/Perception  Perception Perception: Impaired Preception Impairment Details:  Inattention/Neglect Perception-Other Comments: mild inattention L  Cognition Overall Cognitive Status: Impaired/Different from baseline Arousal/Alertness: Awake/alert Orientation Level: Oriented to person;Oriented to place;Oriented to situation;Disoriented to time Year: 2025 Month: September Day of Week: Incorrect Attention: Sustained Sustained Attention: Impaired Sustained Attention Impairment: Verbal basic;Functional basic Memory: Impaired Memory Impairment: Retrieval deficit;Storage deficit;Decreased recall of new information;Decreased short term memory Decreased Short Term Memory: Verbal basic;Functional basic Awareness: Impaired Awareness Impairment: Intellectual impairment;Emergent impairment Problem Solving: Impaired Problem Solving Impairment: Verbal complex;Functional complex  Safety/Judgment: Impaired Comments: patient requiring cues to refrain from scratching head at surgical site Sensation Sensation Light Touch: Impaired by gross assessment (impaired light touch B feet) Coordination Gross Motor Movements are Fluid and Coordinated: No Coordination and Movement Description: Deficits due to generalized weakness, decreased activity tolerance, decreased sensation Motor  Motor Motor: Other (comment) Motor - Skilled Clinical Observations: Deficits due to generalized weakness, decreased activity tolerance  Mobility Bed Mobility Bed Mobility: Rolling Left;Supine to Sit;Sit to Supine Rolling Left: Supervision/Verbal cueing Supine to Sit: Supervision/Verbal cueing Sit to Supine: Supervision/Verbal cueing Transfers Transfers: Sit to Stand;Stand to Sit;Stand Pivot Transfers Sit to Stand: Minimal Assistance - Patient > 75%;Moderate Assistance - Patient 50-74% Stand to Sit: Minimal Assistance - Patient > 75% Stand Pivot Transfers: Minimal Assistance - Patient > 75%;Moderate Assistance - Patient 50 - 74% Stand Pivot Transfer Details: Verbal cues for precautions/safety;Verbal cues  for gait pattern;Verbal cues for safe use of DME/AE Transfer (Assistive device): 1 person hand held assist Locomotion Gait Ambulation: Yes Gait Assistance: Moderate Assistance - Patient 50-74%;Minimal Assistance - Patient > 75% Gait Distance (Feet): 72 Feet Assistive device: 1 person hand held assist Gait Gait velocity: Decreased Stairs / Additional Locomotion Stairs: Yes Stairs Assistance: Minimal Assistance - Patient > 75% Stair Management Technique: Two rails Number of Stairs: 4 Height of Stairs: 6 Wheelchair Mobility Wheelchair Mobility: Yes Wheelchair Assistance: Doctor, general practice: Both upper extremities Distance: 50 Trunk/Postural Assessment  Cervical Assessment Cervical Assessment: Within Functional Limits Thoracic Assessment Thoracic Assessment: Within Functional Limits Lumbar Assessment Lumbar Assessment: Within Functional Limits Postural Control Postural Control: Deficits on evaluation Righting Reactions: Decreased/delayed Protective Responses: Decreased/delayed  Balance Balance Balance Assessed: Yes Static Sitting Balance Static Sitting - Balance Support: Feet supported Static Sitting - Level of Assistance: 5: Stand by assistance Dynamic Sitting Balance Dynamic Sitting - Balance Support: During functional activity Dynamic Sitting - Level of Assistance: 6: Modified independent (Device/Increase time) Static Standing Balance Static Standing - Balance Support: During functional activity Static Standing - Level of Assistance: 4: Min assist Dynamic Standing Balance Dynamic Standing - Balance Support: During functional activity Dynamic Standing - Level of Assistance: 3: Mod assist Extremity Assessment  B UEs as per OT evaluation RLE Assessment RLE Assessment: Exceptions to [facility] Passive Range of Motion (PROM) Comments: WFLs General Strength Comments: 3/5 LLE Assessment LLE Assessment: Exceptions to [facility] Passive Range of Motion  (PROM) Comments: WFLs General Strength Comments: grossly 3-/5  Care Tool Care Tool Bed Mobility Roll left and right activity   Roll left and right assist level: Supervision/Verbal cueing    Sit to lying activity   Sit to lying assist level: Supervision/Verbal cueing    Lying to sitting on side of bed activity   Lying to sitting on side of bed assist level: the ability to move from lying on the back to sitting on the side of the bed with no back support.: Supervision/Verbal cueing     Care Tool Transfers Sit to stand transfer   Sit to stand assist level: Moderate Assistance - Patient 50 - 74%    Chair/bed transfer   Chair/bed transfer assist level: Moderate Assistance - Patient 50 - 74%    Car transfer   Car transfer assist level: Moderate Assistance - Patient 50 - 74%      Care Tool Locomotion Ambulation   Assist level: Moderate Assistance - Patient 50 - 74% Assistive device: Hand held assist Max distance: 72  Walk 10 feet activity   Assist level: Moderate Assistance - Patient -  50 - 74% Assistive device: Hand held assist   Walk 50 feet with 2 turns activity   Assist level: Moderate Assistance - Patient - 50 - 74% Assistive device: Hand held assist  Walk 150 feet activity Walk 150 feet activity did not occur: Safety/medical concerns      Walk 10 feet on uneven surfaces activity Walk 10 feet on uneven surfaces activity did not occur: Safety/medical concerns      Stairs   Assist level: Minimal Assistance - Patient > 75% Stairs assistive device: 2 hand rails Max number of stairs: 4  Walk up/down 1 step activity   Walk up/down 1 step (curb) assist level: Minimal Assistance - Patient > 75% Walk up/down 1 step or curb assistive device: 2 hand rails  Walk up/down 4 steps activity   Walk up/down 4 steps assist level: Minimal Assistance - Patient > 75% Walk up/down 4 steps assistive device: 2 hand rails  Walk up/down 12 steps activity Walk up/down 12 steps activity did  not occur: Safety/medical concerns      Pick up small objects from floor Pick up small object from the floor (from standing position) activity did not occur: Safety/medical concerns      Wheelchair Is the patient using a wheelchair?: No Type of Wheelchair: Manual   Wheelchair assist level: Supervision/Verbal cueing Max wheelchair distance: 50  Wheel 50 feet with 2 turns activity   Assist Level: Supervision/Verbal cueing  Wheel 150 feet activity   Assist Level: Dependent - Patient 0%    Refer to Care Plan for Long Term Goals  SHORT TERM GOAL WEEK 1 PT Short Term Goal 1 (Week 1): STGs = LTGs  Recommendations for other services: None   Skilled Therapeutic Intervention PT evaluation completed and treatment plan initiated. Pt performed multiple sit to stand and stand pivot transfers with min A and no assistive device. Pt required min to mod A as pt fatigued or transferring from a lowe surface height without arm rest. Pt ambulated 72 feet with hand hold assist and min to mod A with verbal cues. Decreased step length and cadence noted. Pt returned to room following treatment. Pt transferred w/c to edge of bed with min A and verbal cues. Pt transferred edge of bed to supine with S and increased time. Pt left sitting up in bed with bed alarm and all needs within reach.    Discharge Criteria: Patient will be discharged from PT if patient refuses treatment 3 consecutive times without medical reason, if treatment goals not met, if there is a change in medical status, if patient makes no progress towards goals or if patient is discharged from hospital.  The above assessment, treatment plan, treatment alternatives and goals were discussed and mutually agreed upon: by patient and by family  Merilee Lynwood MATSU 12/10/2023, 4:21 PM

## 2023-12-10 NOTE — Evaluation (Signed)
 Speech Language Pathology Assessment and Plan  Patient Details  Name: Andrea Bridges MRN: [id number] Date of Birth: 12-10-1960  SLP Diagnosis: Cognitive Impairments  Rehab Potential: Excellent ELOS: 7-10 days    Today's Date: 12/10/2023 SLP Individual Time: [id number] SLP Individual Time Calculation (min): 57 min   Hospital Problem: Principal Problem:   Nontraumatic subcortical hemorrhage of left cerebral hemisphere [facility])  Past Medical History:  Past Medical History:  Diagnosis Date   Acute appendicitis with perforation and localized peritonitis, without abscess or gangrene 12/11/2019   Anxiety    Infertility, female    PCOS (polycystic ovarian syndrome)    Skin cancer    basal cell   SOBOE (shortness of breath on exertion) 09/08/2022   Past Surgical History:  Past Surgical History:  Procedure Laterality Date   APPENDECTOMY  2021   BREAST BIOPSY     IR ANGIO INTRA EXTRACRAN SEL INTERNAL CAROTID BILAT MOD SED  11/29/2023   IR ANGIO VERTEBRAL SEL VERTEBRAL BILAT MOD SED  11/29/2023   IR ANGIO VERTEBRAL SEL VERTEBRAL UNI R MOD SED  11/22/2023   IR US  GUIDE VASC ACCESS RIGHT  11/29/2023    Assessment / Plan / Recommendation Clinical Impression HPI: Pt is a 63 y/o female with PMH of anxiety, tobacco use, and skin cancer who admitted to [facility] on 11/20/23 with c/o of sudden onset thunderclap headache. She was initially AOx4, vomiting, and answering questions and then became unresponsive outside of noxious stimuli. Code stroke called and pt was intubated for airway protection. On Neurology arrival pt unresponsive, moaning, with disconjugate gaze, R hemiparesis, and not following commands. Her NIHSS was 27. Labs revealed mild hypokalemia and hypocalcemia. CT head revealed diffuse basilar SAH with IVH extension and early ventriculomegaly, greatest noted along the left cerebellopontine angle cistern. CTA head/neck showed no definite aneurysm or vascular malformation, but did show concern for  early vasospasm in V4 segment of bilateral vertebral arteries and distal basilar artery. Neurosurgery was consulted and Dr. Gillie placed EVD at bedside on 7/13. It was clamped on 7/25 and removed on 7/26. On the morning on 7/29 pt developed acute AMS with decreased responsiveness and convulsing. Stat CT stable from previous and EEG negative for seizures afterwards but did show mild/moderate diffuse encephalopathy. She was started on keppra . After this event she did have a fever and mildly elevated WBC which has resolved within 24 hours. On 7/30 she was back to baseline. Therapy ongoing and pt has been recommended for CIR   Clinical Impression:  Bedside Swallow Evaluation: A bedside swallow evaluation was completed to assess for s/sx of oropharyngeal dysphagia. Oral mechanism exam WFL. POs administered included thin liquids via straw, puree and solid. Patient with timely mastication and complete oral clearance. No s/sx of aspiration present. Recommend regular/thin diet with use of standardized precautions including sitting upright during PO and taking small bites/sips at a slow rate. Recommend intermittent supervision during mealtimes.  Cognitive-Linguistic: Patient was evaluated via the Cognistat to assess cognitive linguistic functioning. Patient scored with moderate deficits in the subtests of orientation, attention, calculations, visual spatial skills and judgement. Patient with severe deficits in short term memory. Patient required mod cues to acknowledge admission was approximately 3 weeks ago (patient recalling admission 2 years ago) Informally, noted deficits in intellectual awareness as patient reports no changes in cognitive skills - despite being independent PTA. Expressive/receptive language skills WFL and patient is 100% intelligible at the conversational level. Recommend targeting memory, awareness, attention and memory during inpatient rehabilitation stay. Pt  would benefit from skilled ST  services to maximize cognition in order to maximize functional independence at d/c. Anticipate patient will require 24 hour supervision at d/c and f/u SLP services.    Skilled Therapeutic Interventions          Patient evaluated using a standardized cognitive linguistic assessment and bedside swallow evaluation to assess current cognitive, communicative and swallowing function. See above for details.    SLP Assessment  Patient will need skilled Speech Lanaguage Pathology Services during CIR admission    Recommendations  SLP Diet Recommendations: Age appropriate regular solids;Thin Liquid Administration via: Straw;Cup Medication Administration: Whole meds with liquid Supervision: Intermittent supervision to cue for compensatory strategies Compensations: Slow rate;Small sips/bites Postural Changes and/or Swallow Maneuvers: Seated upright 90 degrees Oral Care Recommendations: Oral care BID Patient destination: Home Follow up Recommendations: Outpatient SLP Equipment Recommended: None recommended by SLP    SLP Frequency 3 to 5 out of 7 days   SLP Duration  SLP Intensity  SLP Treatment/Interventions 7-10 days  Minumum of 1-2 x/day, 30 to 90 minutes  Cognitive remediation/compensation;Cueing hierarchy;Functional tasks;Internal/external aids;Patient/family education;Therapeutic Activities    Pain Back pain- repositioned   SLP Evaluation Cognition Overall Cognitive Status: Impaired/Different from baseline Arousal/Alertness: Awake/alert Orientation Level: Oriented to person;Oriented to place;Oriented to situation;Disoriented to time Year: 2025 Month: September Day of Week: Incorrect Attention: Sustained Sustained Attention: Impaired Sustained Attention Impairment: Verbal basic;Functional basic Memory: Impaired Memory Impairment: Retrieval deficit;Storage deficit;Decreased recall of new information;Decreased short term memory Decreased Short Term Memory: Verbal basic;Functional  basic Awareness: Impaired Awareness Impairment: Intellectual impairment;Emergent impairment Problem Solving: Impaired Problem Solving Impairment: Verbal complex;Functional complex Comments: patient requiring cues to refrain from scratching head at surgical site  Comprehension Auditory Comprehension Overall Auditory Comprehension: Appears within functional limits for tasks assessed Yes/No Questions: Within Functional Limits Commands: Within Functional Limits Expression Expression Primary Mode of Expression: Verbal Verbal Expression Overall Verbal Expression: Appears within functional limits for tasks assessed Initiation: No impairment Repetition: No impairment Naming: No impairment Confrontation: Within functional limits Oral Motor Oral Motor/Sensory Function Overall Oral Motor/Sensory Function: Within functional limits Motor Speech Overall Motor Speech: Appears within functional limits for tasks assessed Respiration: Within functional limits Resonance: Within functional limits Articulation: Within functional limitis Intelligibility: Intelligible Motor Planning: Within functional limits  Care Tool Care Tool Cognition Ability to hear (with hearing aid or hearing appliances if normally used Ability to hear (with hearing aid or hearing appliances if normally used): 0. Adequate - no difficulty in normal conservation, social interaction, listening to TV   Expression of Ideas and Wants Expression of Ideas and Wants: 3. Some difficulty - exhibits some difficulty with expressing needs and ideas (e.g, some words or finishing thoughts) or speech is not clear   Understanding Verbal and Non-Verbal Content Understanding Verbal and Non-Verbal Content: 3. Usually understands - understands most conversations, but misses some part/intent of message. Requires cues at times to understand  Memory/Recall Ability Memory/Recall Ability : That he or she is in a hospital/hospital unit;Current season     Bedside Swallowing Assessment General Previous Swallow Assessment: 7/16 Diet Prior to this Study: Regular;Thin liquids (Level 0) Respiratory Status: Room air Behavior/Cognition: Cooperative;Lethargic/Drowsy;Pleasant mood Oral Cavity - Dentition: Adequate natural dentition Self-Feeding Abilities: Able to feed self Vision: Functional for self-feeding Patient Positioning: Upright in bed Volitional Cough: Weak Volitional Swallow: Able to elicit  Ice Chips Ice chips: Not tested Thin Liquid Thin Liquid: Within functional limits Presentation: Straw;Self Fed Nectar Thick Nectar Thick Liquid: Not tested Honey Thick Honey Thick  Liquid: Not tested Puree Puree: Within functional limits Presentation: Self Fed;Spoon Solid Solid: Within functional limits Presentation: Self Fed BSE Assessment Risk for Aspiration Impact on safety and function: No limitations Other Related Risk Factors: Cognitive impairment  Short Term Goals: Week 1: SLP Short Term Goal 1 (Week 1): STG = LTG due to ELOS  Refer to Care Plan for Long Term Goals  Recommendations for other services: None   Discharge Criteria: Patient will be discharged from SLP if patient refuses treatment 3 consecutive times without medical reason, if treatment goals not met, if there is a change in medical status, if patient makes no progress towards goals or if patient is discharged from hospital.  The above assessment, treatment plan, treatment alternatives and goals were discussed and mutually agreed upon: by patient and by family  Zita Ozimek M.A., CCC-SLP 12/10/2023, 1:50 PM

## 2023-12-10 NOTE — Plan of Care (Signed)
  Problem: RH Balance Goal: LTG Patient will maintain dynamic standing with ADLs (OT) Description: LTG:  Patient will maintain dynamic standing balance with assist during activities of daily living (OT)  Flowsheets (Taken 12/10/2023 1236) LTG: Pt will maintain dynamic standing balance during ADLs with: Supervision/Verbal cueing   Problem: RH Bathing Goal: LTG Patient will bathe all body parts with assist levels (OT) Description: LTG: Patient will bathe all body parts with assist levels (OT) Flowsheets (Taken 12/10/2023 1236) LTG: Pt will perform bathing with assistance level/cueing: Supervision/Verbal cueing   Problem: RH Dressing Goal: LTG Patient will perform lower body dressing w/assist (OT) Description: LTG: Patient will perform lower body dressing with assist, with/without cues in positioning using equipment (OT) Flowsheets (Taken 12/10/2023 1236) LTG: Pt will perform lower body dressing with assistance level of: Supervision/Verbal cueing   Problem: RH Toileting Goal: LTG Patient will perform toileting task (3/3 steps) with assistance level (OT) Description: LTG: Patient will perform toileting task (3/3 steps) with assistance level (OT)  Flowsheets (Taken 12/10/2023 1236) LTG: Pt will perform toileting task (3/3 steps) with assistance level: Supervision/Verbal cueing   Problem: RH Toilet Transfers Goal: LTG Patient will perform toilet transfers w/assist (OT) Description: LTG: Patient will perform toilet transfers with assist, with/without cues using equipment (OT) Flowsheets (Taken 12/10/2023 1236) LTG: Pt will perform toilet transfers with assistance level of: Supervision/Verbal cueing   Problem: RH Tub/Shower Transfers Goal: LTG Patient will perform tub/shower transfers w/assist (OT) Description: LTG: Patient will perform tub/shower transfers with assist, with/without cues using equipment (OT) Flowsheets (Taken 12/10/2023 1236) LTG: Pt will perform tub/shower stall transfers with  assistance level of: Supervision/Verbal cueing   Problem: RH Memory Goal: LTG Patient will demonstrate ability for day to day recall/carry over during activities of daily living with assistance level (OT) Description: LTG:  Patient will demonstrate ability for day to day recall/carry over during activities of daily living with assistance level (OT). Flowsheets (Taken 12/10/2023 1236) LTG:  Patient will demonstrate ability for day to day recall/carry over during activities of daily living with assistance level (OT): Modified Independent   Problem: RH Attention Goal: LTG Patient will demonstrate this level of attention during functional activites (OT) Description: LTG:  Patient will demonstrate this level of attention during functional activites  (OT) Flowsheets (Taken 12/10/2023 1236) Patient will demonstrate this level of attention during functional activites: Alternating Patient will demonstrate above attention level in the following environment: Home LTG: Patient will demonstrate this level of attention during functional activites (OT): Modified Independent   Problem: RH Awareness Goal: LTG: Patient will demonstrate awareness during functional activites type of (OT) Description: LTG: Patient will demonstrate awareness during functional activites type of (OT) Flowsheets (Taken 12/10/2023 1236) Patient will demonstrate awareness during functional activites type of: Anticipatory LTG: Patient will demonstrate awareness during functional activites type of (OT): Modified Independent

## 2023-12-10 NOTE — Plan of Care (Signed)
  Problem: Consults Goal: RH BRAIN INJURY PATIENT EDUCATION Description: Description: See Patient Education module for eduction specifics Outcome: Progressing   Problem: RH BOWEL ELIMINATION Goal: RH STG MANAGE BOWEL WITH ASSISTANCE Description: STG Manage Bowel with supervision Assistance. Outcome: Progressing   Problem: RH BLADDER ELIMINATION Goal: RH STG MANAGE BLADDER WITH ASSISTANCE Description: STG Manage Bladder With  supervision Assistance Outcome: Progressing   Problem: RH SKIN INTEGRITY Goal: RH STG SKIN FREE OF INFECTION/BREAKDOWN Description: Manage skin free of infection / breakdown with supervision assistance Outcome: Progressing   Problem: RH SAFETY Goal: RH STG ADHERE TO SAFETY PRECAUTIONS W/ASSISTANCE/DEVICE Description: STG Adhere to Safety Precautions With supervision  Assistance/Device. Outcome: Progressing   Problem: RH PAIN MANAGEMENT Goal: RH STG PAIN MANAGED AT OR BELOW PT'S PAIN GOAL Outcome: Progressing   Problem: RH KNOWLEDGE DEFICIT BRAIN INJURY Goal: RH STG INCREASE KNOWLEDGE OF SELF CARE AFTER BRAIN INJURY Description: Manage increase knowledge of self care after brain injury with supervision assistance from son. Daughters using Transport planner provided.  Outcome: Progressing

## 2023-12-10 NOTE — Progress Notes (Signed)
 OT noted fluid leaking from suture on top of head, husband at bed side concerned that it has happened prior to CIR. Nurse noted a clear/yellow tinged fluid running down forehead, MD aware and assessed at bedside.

## 2023-12-10 NOTE — Progress Notes (Signed)
 PROGRESS NOTE   Subjective/Complaints:   Nervous about full day of therapy- slightly tearful about not making progress.   Husband at bedside the first time- daughter 2nd and 3rd visit.   HA this AM- got tylenol , but has also gotten Fioricet - was 7-8/10 at 1pm- but better- down to 4/10 when spoke/seen at ~ 4pm- but when she sits up, it's up to 5-6/10. Spoke to nursing 5x today about fluid from her head- it appears like coming from burr hole- and it clear, except traces of Betadine (?) from her head- doesn't appear to be coming from EVD spot on head- - and when she laid down on bed- on L side, it drained onto pillow covering 1/2 or more of pillow case 2nd time and more than that 3rd time- the 1st time, called to room due to fluid pouring down her face'- spot on Burr hole site is puffier than yesterday and was draining large amounts- dripping off her nose/down her Forehead. Stopped by the time I got there- initially was thinking seroma< because was not draining when I pushed on any site on her head.    ROS:  Pt denies SOB, abd pain, CP, N/V/C/D, and vision changes  (+) drainage from head- is clear  Objective:   No results found. Recent Labs    12/08/23 0418 12/09/23 2040  WBC 6.6 7.4  HGB 12.2 11.1*  HCT 36.0 33.4*  PLT 383 363   Recent Labs    12/08/23 0418 12/09/23 0406 12/09/23 2040  NA 133* 134*  --   K 3.5 3.4*  --   CL 103 104  --   CO2 22 20*  --   GLUCOSE 104* 106*  --   BUN 17 14  --   CREATININE 0.71 0.78 0.76  CALCIUM 9.1 9.1  --     Intake/Output Summary (Last 24 hours) at 12/10/2023 1634 Last data filed at 12/09/2023 2300 Gross per 24 hour  Intake 120 ml  Output --  Net 120 ml        Physical Exam: Vital Signs Blood pressure (!) 97/59, pulse 68, temperature 97.9 F (36.6 C), temperature source Oral, resp. rate 18, SpO2 100%.    General: awake, alert, appropriate,  quiet- head shaved;  husband, then daughter in room; NAD HENT: conjugate gaze; oropharynx moist; burr hole site with sutures and  Staples at EVD site were OK- neither overgrown CV: regular rate and rhythm; no JVD Pulmonary: CTA B/L; no W/R/R- good air movement GI: soft, NT, ND, (+)BS Psychiatric: appropriate- quiet; tearful at times Neurological: alert but poor memory  Neuro:   Mental Status: Alert and oriented x4,  Following commands. Recalls 0/3 words 5 min later.  Speech/Languate: A little delayed responses. Can name and repeat. CRANIAL NERVES:   II: PERRL. Visual fields full III, IV, VI: EOM intact, no gaze preference or deviation V: normal sensation bilaterally VII: no asymmetry VIII: normal hearing to speech IX, X: normal palatal elevation XI: 5/5 head turn and 5/5 shoulder shrug bilaterally XII: Tongue midline       MOTOR: RUE: 4+/5 Deltoid, 4+/5 Biceps, 4+/5 Triceps,4+/5 Grip LUE: 4+/5 Deltoid, 4+/5 Biceps, 4+/5 Triceps, 4+/5 Grip  RLE: HF 4/5, KE 5/5, ADF 4+/5, APF 4+/5 LLE: HF 4/5, KE 5/5, ADF 4+/5, APF 4+/5   REFLEXES: DTR 2+ patella and Achilles bilaterally, 2-3 beats of clonus b/l ankles   SENSORY: Normal to touch all 4 extremities   MSK: no joint swelling noted     Assessment/Plan: 1. Functional deficits which require 3+ hours per day of interdisciplinary therapy in a comprehensive inpatient rehab setting. Physiatrist is providing close team supervision and 24 hour management of active medical problems listed below. Physiatrist and rehab team continue to assess barriers to discharge/monitor patient progress toward functional and medical goals  Care Tool:  Bathing    Body parts bathed by patient: Right arm, Left arm, Chest, Abdomen, Front perineal area, Buttocks, Right upper leg, Left upper leg, Right lower leg, Left lower leg, Face         Bathing assist Assist Level: Contact Guard/Touching assist     Upper Body Dressing/Undressing Upper body dressing   What is the  patient wearing?: Bra, Pull over shirt    Upper body assist Assist Level: Supervision/Verbal cueing    Lower Body Dressing/Undressing Lower body dressing      What is the patient wearing?: Underwear/pull up, Pants     Lower body assist Assist for lower body dressing: Contact Guard/Touching assist     Toileting Toileting    Toileting assist Assist for toileting: Contact Guard/Touching assist     Transfers Chair/bed transfer  Transfers assist     Chair/bed transfer assist level: Moderate Assistance - Patient 50 - 74%     Locomotion Ambulation   Ambulation assist      Assist level: Moderate Assistance - Patient 50 - 74% Assistive device: Hand held assist Max distance: 72   Walk 10 feet activity   Assist     Assist level: Moderate Assistance - Patient - 50 - 74% Assistive device: Hand held assist   Walk 50 feet activity   Assist    Assist level: Moderate Assistance - Patient - 50 - 74% Assistive device: Hand held assist    Walk 150 feet activity   Assist Walk 150 feet activity did not occur: Safety/medical concerns         Walk 10 feet on uneven surface  activity   Assist Walk 10 feet on uneven surfaces activity did not occur: Safety/medical concerns         Wheelchair     Assist Is the patient using a wheelchair?: No Type of Wheelchair: Manual    Wheelchair assist level: Supervision/Verbal cueing Max wheelchair distance: 50    Wheelchair 50 feet with 2 turns activity    Assist        Assist Level: Supervision/Verbal cueing   Wheelchair 150 feet activity     Assist      Assist Level: Dependent - Patient 0%   Blood pressure (!) 97/59, pulse 68, temperature 97.9 F (36.6 C), temperature source Oral, resp. rate 18, SpO2 100%.  Medical Problem List and Plan: 1. Functional deficits secondary to nontraumatic subarachnoid hemorrhage with hydrocephalus status post EVD removed 7/27 and initial burr hole              -patient may shower, cover incision please             -ELOS/Goals: 10-14 days, PT/OT/SLP sup             First day of evaluations- Con't CIR PT, OT and SLP 2.  Antithrombotics: -DVT/anticoagulation:  Pharmaceutical: Lovenox              -  antiplatelet therapy: N/A 3. Pain Management: Topamax  25 mg twice daily, Fioricet  as needed, oxycodone  5 mg every 4 hours as needed pain  8/2- needed Fioricet  and tylenol  for HA's today- con't regimen 4. Mood/Behavior/Sleep: Provide emotional support             -antipsychotic agents: N/A 5. Neuropsych/cognition: This patient is not capable of making decisions on her own behalf. 6. Skin/Wound Care: Routine skin checks  8/2- although sutures are 3 weeks, will not remove until Ok'd by NSU due to concern about fluid drainage- is clear- not enough right now to test for CSF- d/w nursing as well as NICU nursing and NSU-also con't Staples from EVD site and monitor-  NSU will come see tomorrow during rounds 7. Fluids/Electrolytes/Nutrition: Routine in and outs with follow-up chemistries 8.  Seizure prophylaxis.  Keppra  500 mg twice daily.  EEG negative 9.  Fevers/E. coli UTI/urinary retention.  7-day course Ancef  completed.  Blood cultures/CSF no growth to date and placed on Bactrim  DS 1 tablet every 12 hours x 10 doses 12/08/2023 for possible UTI.  Currently on airborne and contact precautions pending COVID/RVP results. - 8/2- off precautions   Continue Flomax  0.8 mg daily, Urecholine  10 mg 3 times daily.   - Consider voiding trial in 2 to 3 days  8/2- will leave foley until Sunday/Monday- pt overwhelmed by the idea of in/out cath yet- will let her get used to CIR before removing- x1 day 10.  Incidental finding of 7 mm nodular density in the medial right lung base.  Follow-up outpatient. 11.  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  Sodium 131-133.  Serum osmolality 289 urine osmolality 740 urine sodium 80.  Continue salt tablets 2 g 3 times daily.  Avoid hypotonic  fluids.  8/2- Na better 134 12.  Vasospasm Prevention.  Nimodipine  60 mg every 4 hours. NSGY note indicates for 21 days (end date 8/3) 13.  Tobacco abuse.  NicoDerm patch.  Provide counseling 14.  Overweight/Class I obesity.  Last BMI 29.80.  Dietary follow-up 15. Hypokalemia  8/2- K+ 3.4- will replete with 40 Meq x1.  16. Vasospasms- on Nimodipine - but was hypotensive-  8/2- will con't Nimodipine  however will add midodrine  2.5 mg TID as needed for soft BP- will also order TEDs and ACE wraps if needed.   I spent a total of  59  minutes on total care today- >50% coordination of care- due to  Spent >50 minutes in face to face time- and over 15 minutes in addition speaking to nursing directly. Went over labs, vitals and direct pt contact.         LOS: 1 days A FACE TO FACE EVALUATION WAS PERFORMED  Francesca Strome 12/10/2023, 4:34 PM

## 2023-12-10 NOTE — Progress Notes (Signed)
 Inpatient Rehabilitation Admission Medication Review by a Pharmacist  A complete drug regimen review was completed for this patient to identify any potential clinically significant medication issues.  High Risk Drug Classes Is patient taking? Indication by Medication  Antipsychotic No   Anticoagulant Yes Lovenox : VTE ppx  Antibiotic Yes Bactrim  x 5 days: UTI (end 8/5)  Opioid Yes Oxycodone : pain  Antiplatelet No   Hypoglycemics/insulin  No   Vasoactive Medication Yes Nimodipine  x 21 days: vasospasm ppx (End 8/3)  Chemotherapy No   Other Yes Bethanechol , Flomax : urinary retention Keppra : seizure ppx Nicotine : nicotine  dependence Nystatin : thrush Protonix : GERD NaCl: supplement Topamax : headaches Tylenol : pain Fioricet : headache Imodium : loose stools     Type of Medication Issue Identified Description of Issue Recommendation(s)  Drug Interaction(s) (clinically significant)     Duplicate Therapy     Allergy     No Medication Administration End Date     Incorrect Dose     Additional Drug Therapy Needed     Significant med changes from prior encounter (inform family/care partners about these prior to discharge). PTA meds not restarted: Wegovy , vitD, ibuprofen Restart or discontinue as appropriate. Communicate medication changes with patient/family at discharge  Other       Clinically significant medication issues were identified that warrant physician communication and completion of prescribed/recommended actions by midnight of the next day:  No    Pharmacist comments: consider restart/recheck vitamin D    Time spent performing this drug regimen review (minutes): 30   Thank you for allowing pharmacy to be a part of this patient's care.   Bascom JAYSON Louder, PharmD 12/10/2023 7:48 AM     **Pharmacist phone directory can be found on [email or web address] listed under [facility]**

## 2023-12-11 LAB — CULTURE, BLOOD (ROUTINE X 2): Culture: NO GROWTH

## 2023-12-11 MED ORDER — LIDOCAINE 5 % EX PTCH
2.0000 | MEDICATED_PATCH | CUTANEOUS | Status: DC
  Administered 2023-12-11 – 2023-12-14 (×4): 2 via TRANSDERMAL
  Filled 2023-12-11 (×11): qty 2

## 2023-12-11 MED ORDER — MIRTAZAPINE 15 MG PO TABS
15.0000 mg | ORAL_TABLET | Freq: Every day | ORAL | Status: DC
  Administered 2023-12-11 – 2023-12-14 (×4): 15 mg via ORAL
  Filled 2023-12-11 (×4): qty 1

## 2023-12-11 MED ORDER — MIDODRINE HCL 5 MG PO TABS
5.0000 mg | ORAL_TABLET | Freq: Three times a day (TID) | ORAL | Status: DC | PRN
  Administered 2023-12-14: 5 mg via ORAL
  Filled 2023-12-11 (×2): qty 1

## 2023-12-11 MED ORDER — LIDOCAINE HCL URETHRAL/MUCOSAL 2 % EX GEL: 1.0000 | CUTANEOUS | Status: DC | PRN

## 2023-12-11 NOTE — Progress Notes (Signed)
 Physical Therapy Session Note  Patient Details  Name: Andrea Bridges MRN: [id number] Date of Birth: [birth year]  Today's Date: 12/11/2023 PT Individual Time: 0930-1040 + 1340-1450 PT Individual Time Calculation (min): 70 min + 70 min  Short Term Goals: Week 1:  PT Short Term Goal 1 (Week 1): STGs = LTGs  Skilled Therapeutic Interventions/Progress Updates:    Session 1: Chart reviewed and pt agreeable to therapy. Pt received semi-reclined in bed with 3/10 c/o pain in head. Also of note, pt c/o increase pain in head to 5/10 after sitting EOB. Session focused on functional transfers and activity tolerance to promote functional recovery for out of bed mobility and endurance for mobility exercises. Pt initiated session with transfer to EOB using S. Pt c/o increased head pain and dizziness. Pt BP checked multiple times over 15 mins. First BP 94/32mmHg. RN administered medication and TED hose and ace wraps donned with pt requiring S for mobility between sit<>supine. BP then assessed at 96/73mmHg and 92/66mmHg in sitting with 5 min break. Pt completed sit to stand to donn pants with S + no AD. Neurosurgery entered to address concerns for CSF leak, but stated no concern and PT session could continue. Pt educated on symptom monitoring including changes in head pain, physical function, or sensation. RN assessed manual BP to be 96/19mmHg after another 15 mins. Pt continued requiring S for sitting balance and reaches outside BOS. At end of session, pt was left semi-reclined in bed with alarm engaged, nurse call bell and all needs in reach.      Session 2: Chart reviewed and pt agreeable to therapy. Pt received semi-reclined in bed with 2/10 c/o pain in head. Also of note, BP assessed and found to be 87/12mmHg in sitting and 94/32mmHg in sitting after donning TED hose and ace wraps. Session focused on functional transfers and ambulation to promote functional recovery for out of bed mobility, safe home mobility and  access, and activity tolerance. Once BP managed, pt initiated session with amb to toilet using CGA + RW. Pt required S for pericare. Pt then completed blocked practice of 145ft-400ft amb using CGA + RW. Pt noted to have very slow gait speed (0.68m/s), but good control of DME. No increased dizziness or head pain with amb. Session education emphasized safe use of DME. At end of session, pt was left semi-reclined in bed with alarm engaged, nurse call bell and all needs in reach.  Therapy Documentation Precautions:  Precautions Precautions: Fall Recall of Precautions/Restrictions: Impaired Precaution/Restrictions Comments: SBP <160 Restrictions Weight Bearing Restrictions Per Provider Order: No General:      Therapy/Group: Individual Therapy  Warrick KANDICE Raspberry 12/11/2023, 12:01 PM

## 2023-12-11 NOTE — Progress Notes (Addendum)
 PROGRESS NOTE   Subjective/Complaints:   Pt very restless per night nurse note and husband getting texts at 1am.   Changed head dressing per nurse- was saturated- clearHas foley- wants it out- which I agree- ate better.  Nursing wondering about sleeping meds? Also c/o low back/buttock pain-    ROS:  Pt denies SOB, abd pain, CP, N/V/C/D, and vision changes  (+) drainage from head- is clear  Objective:   No results found. Recent Labs    12/09/23 2040  WBC 7.4  HGB 11.1*  HCT 33.4*  PLT 363   Recent Labs    12/09/23 0406 12/09/23 2040  NA 134*  --   K 3.4*  --   CL 104  --   CO2 20*  --   GLUCOSE 106*  --   BUN 14  --   CREATININE 0.78 0.76  CALCIUM 9.1  --     Intake/Output Summary (Last 24 hours) at 12/11/2023 1245 Last data filed at 12/11/2023 0744 Gross per 24 hour  Intake 480 ml  Output 1900 ml  Net -1420 ml        Physical Exam: Vital Signs Blood pressure 102/66, pulse 72, temperature 97.8 F (36.6 C), temperature source Oral, resp. rate 19, SpO2 99%.     General: awake, alert, constantly picking/fidgeting with head, skin; husband at bedside;  NAD HENT: conjugate gaze; oropharynx moist- bandage on head over burr hold- was saturated- showed me dressing, clear- staples on EVD site- look OK CV: regular rate and rhythm; no JVD Pulmonary: CTA B/L; no W/R/R- good air movement GI: soft, NT, ND, (+)BS Psychiatric: appropriate- restless- constantly picking Neurological: poor memory GU- foley out Neuro:   Mental Status: Alert and oriented x4,  Following commands. Recalls 0/3 words 5 min later.  Speech/Languate: A little delayed responses. Can name and repeat. CRANIAL NERVES:   II: PERRL. Visual fields full III, IV, VI: EOM intact, no gaze preference or deviation V: normal sensation bilaterally VII: no asymmetry VIII: normal hearing to speech IX, X: normal palatal elevation XI: 5/5 head turn  and 5/5 shoulder shrug bilaterally XII: Tongue midline       MOTOR: RUE: 4+/5 Deltoid, 4+/5 Biceps, 4+/5 Triceps,4+/5 Grip LUE: 4+/5 Deltoid, 4+/5 Biceps, 4+/5 Triceps, 4+/5 Grip RLE: HF 4/5, KE 5/5, ADF 4+/5, APF 4+/5 LLE: HF 4/5, KE 5/5, ADF 4+/5, APF 4+/5   REFLEXES: DTR 2+ patella and Achilles bilaterally, 2-3 beats of clonus b/l ankles   SENSORY: Normal to touch all 4 extremities   MSK: no joint swelling noted     Assessment/Plan: 1. Functional deficits which require 3+ hours per day of interdisciplinary therapy in a comprehensive inpatient rehab setting. Physiatrist is providing close team supervision and 24 hour management of active medical problems listed below. Physiatrist and rehab team continue to assess barriers to discharge/monitor patient progress toward functional and medical goals  Care Tool:  Bathing    Body parts bathed by patient: Right arm, Left arm, Chest, Abdomen, Front perineal area, Buttocks, Right upper leg, Left upper leg, Right lower leg, Left lower leg, Face         Bathing assist Assist Level: Contact Guard/Touching assist  Upper Body Dressing/Undressing Upper body dressing   What is the patient wearing?: Bra, Pull over shirt    Upper body assist Assist Level: Supervision/Verbal cueing    Lower Body Dressing/Undressing Lower body dressing      What is the patient wearing?: Underwear/pull up, Pants     Lower body assist Assist for lower body dressing: Contact Guard/Touching assist     Toileting Toileting    Toileting assist Assist for toileting: Contact Guard/Touching assist     Transfers Chair/bed transfer  Transfers assist     Chair/bed transfer assist level: Moderate Assistance - Patient 50 - 74%     Locomotion Ambulation   Ambulation assist      Assist level: Moderate Assistance - Patient 50 - 74% Assistive device: Hand held assist Max distance: 72   Walk 10 feet activity   Assist     Assist  level: Moderate Assistance - Patient - 50 - 74% Assistive device: Hand held assist   Walk 50 feet activity   Assist    Assist level: Moderate Assistance - Patient - 50 - 74% Assistive device: Hand held assist    Walk 150 feet activity   Assist Walk 150 feet activity did not occur: Safety/medical concerns         Walk 10 feet on uneven surface  activity   Assist Walk 10 feet on uneven surfaces activity did not occur: Safety/medical concerns         Wheelchair     Assist Is the patient using a wheelchair?: No Type of Wheelchair: Manual    Wheelchair assist level: Supervision/Verbal cueing Max wheelchair distance: 50    Wheelchair 50 feet with 2 turns activity    Assist        Assist Level: Supervision/Verbal cueing   Wheelchair 150 feet activity     Assist      Assist Level: Dependent - Patient 0%   Blood pressure 102/66, pulse 72, temperature 97.8 F (36.6 C), temperature source Oral, resp. rate 19, SpO2 99%.  Medical Problem List and Plan: 1. Functional deficits secondary to nontraumatic subarachnoid hemorrhage with hydrocephalus status post EVD removed 7/27 and initial burr hole             -patient may shower, cover incision please             -ELOS/Goals: 10-14 days, PT/OT/SLP sup            Con't CIR PT OT and SLP   2.  Antithrombotics: -DVT/anticoagulation:  Pharmaceutical: Lovenox              -antiplatelet therapy: N/A 3. Pain Management: Topamax  25 mg twice daily, Fioricet  as needed, oxycodone  5 mg every 4 hours as needed pain  8/2- needed Fioricet  and tylenol  for HA's today- con't regimen  8/3- HA a little more today- 5-6/10 but she reported this yesterday- will add Lidoderm  patches for low back/buttocks pain 8pm to 8am- explained canot add steroids secondary to NSU issues 4. Mood/Behavior/Sleep: Provide emotional support             -antipsychotic agents: N/A 5. Neuropsych/cognition: This patient is not capable of making  decisions on her own behalf. 6. Skin/Wound Care: Routine skin checks  8/2- although sutures are 3 weeks, will not remove until Ok'd by NSU due to concern about fluid drainage- is clear- not enough right now to test for CSF- d/w nursing as well as NICU nursing and NSU-also con't Staples from EVD site and monitor-  NSU will come see tomorrow during rounds  8/3- appears to be a CSF leak, base don  halo- NSU will see tomorrow- don't see note so far, going off nursing report. See below for more in depth 7. Fluids/Electrolytes/Nutrition: Routine in and outs with follow-up chemistries 8.  Seizure prophylaxis.  Keppra  500 mg twice daily.  EEG negative 9.  Fevers/E. coli UTI/urinary retention.  7-day course Ancef  completed.  Blood cultures/CSF no growth to date and placed on Bactrim  DS 1 tablet every 12 hours x 10 doses 12/08/2023 for possible UTI.  Currently on airborne and contact precautions pending COVID/RVP results. - 8/2- off precautions   Continue Flomax  0.8 mg daily, Urecholine  10 mg 3 times daily.   - Consider voiding trial in 2 to 3 days  8/2- will leave foley until Sunday/Monday- pt overwhelmed by the idea of in/out cath yet- will let her get used to CIR before removing- x1 day  8/3- foley removed- ordered to bladder scan q6 hours and cath if volmes >350cc- with lidocaine - went over with husband and pt- she's agreeable.  10.  Incidental finding of 7 mm nodular density in the medial right lung base.  Follow-up outpatient. 11.  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  Sodium 131-133.  Serum osmolality 289 urine osmolality 740 urine sodium 80.  Continue salt tablets 2 g 3 times daily.  Avoid hypotonic fluids.  8/2- Na better 134 12.  Vasospasm Prevention.  Nimodipine  60 mg every 4 hours. NSGY note indicates for 21 days (end date 8/3) 13.  Tobacco abuse.  NicoDerm patch.  Provide counseling  8/3- talked a lot of oral fixation- will get putty- d/w OT to get putty for pt since also a picker so can reduce amount of  picking at head. Prior to this, husband reports was planning on  stopping vaping, so don't want to encourage that.  14.  Overweight/Class I obesity.  Last BMI 29.80.  Dietary follow-up 15. Hypokalemia  8/2- K+ 3.4- will replete with 40 Meq x1.  16. Vasospasms- on Nimodipine - but was hypotensive-  8/2- will con't Nimodipine  however will add midodrine  2.5 mg TID as needed for soft BP- will also order TEDs and ACE wraps if needed.  8/3- BP still soft- like running 90's systolic- will increase midodrine  to 5 mg TID prn- for SBP <100- shouldn't need once Nimodipine  is finished, hopefully 17. Insomnia/restless at night  8/3- added Remeron  since bladder issues, cannot do Trazadone etc, and has decreased appetite.   I spent a total of 57   minutes on total care today- >50% coordination of care- due to  D/w nursing multiple times, pt seen 2x and also d/w husband at length both times. Heard that NSU saw pt- and noted CSF leak possible due to halo on dressing- however he just wanted it to stay covered and thought it was tiny, per nursing- will see her again in AM.     Addendum- BP dropped to 77/52- will stop Urecholine   LOS: 2 days A FACE TO FACE EVALUATION WAS PERFORMED  Davia Smyre 12/11/2023, 12:45 PM

## 2023-12-11 NOTE — Progress Notes (Signed)
 Restless until 0030. At 0230, dressing to head & part of pillowcase with drainage. This is first dressing change since applied on previous shift.PRN fioricet  given at 2204. Foley patent. Complained of feeling full. Bladder scan =0. Andrea Bridges A

## 2023-12-11 NOTE — Progress Notes (Signed)
 Occupational Therapy Session Note  Patient Details  Name: Andrea Bridges MRN: [id number] Date of Birth: Apr 29, 1961  Today's Date: 12/11/2023 OT Individual Time: 0800-0900 OT Individual Time Calculation (min): 60 min    Short Term Goals: Week 1:  OT Short Term Goal 1 (Week 1): STGs=LTGs due to patient's estimated length of stay.  Skilled Therapeutic Interventions/Progress Updates:  Patient in bed with family present at the time of treatment. Patient indicated a pain response of 6 on 0-10 for head pain. Patient went on to say that she rested okay during the night. The pt was in agreement with completing BADL related task in bathing in preparation for her day EOB.  The pt was able to transfer from supine in bed to EOB bed with ModA, transitioning to MinA. The pt was  able to  doff her over head shirt with CGA at EOB.  The pt was able to come from sit to stand at [facility], she was able to doff her shorts and underwear with MinA. The pt was able to bathe her UB with s/uA, She was MinA with LB. The pt was able to donn her over head shirt with s/uA, she was MinA with her underwear, shorts, and non-skid sock. The pt was able to complete grooming task for brushing her teeth with s/uA. The pt was able to transfer back to bed LOF with CGA with the call light and the bed side table within reach,  all additional needs were addressed prior to exiting the room.  The patient's family was present throughout the visit.   Therapy Documentation Precautions:  Precautions Precautions: Fall Recall of Precautions/Restrictions: Impaired Precaution/Restrictions Comments: SBP <160 Restrictions Weight Bearing Restrictions Per Provider Order: No  Therapy/Group: Individual Therapy  [name] 12/11/2023, 12:08 PM

## 2023-12-12 LAB — CBC WITH DIFFERENTIAL/PLATELET
Abs Immature Granulocytes: 0.04 K/uL (ref 0.00–0.07)
Basophils Absolute: 0 K/uL (ref 0.0–0.1)
Basophils Relative: 1 %
Eosinophils Absolute: 0.2 K/uL (ref 0.0–0.5)
Eosinophils Relative: 3 %
HCT: 33.7 % — ABNORMAL LOW (ref 36.0–46.0)
Hemoglobin: 11.2 g/dL — ABNORMAL LOW (ref 12.0–15.0)
Immature Granulocytes: 1 %
Lymphocytes Relative: 30 %
Lymphs Abs: 1.7 K/uL (ref 0.7–4.0)
MCH: 27.7 pg (ref 26.0–34.0)
MCHC: 33.2 g/dL (ref 30.0–36.0)
MCV: 83.2 fL (ref 80.0–100.0)
Monocytes Absolute: 0.4 K/uL (ref 0.1–1.0)
Monocytes Relative: 8 %
Neutro Abs: 3.3 K/uL (ref 1.7–7.7)
Neutrophils Relative %: 57 %
Platelets: 305 K/uL (ref 150–400)
RBC: 4.05 MIL/uL (ref 3.87–5.11)
RDW: 15.2 % (ref 11.5–15.5)
WBC: 5.6 K/uL (ref 4.0–10.5)
nRBC: 0 % (ref 0.0–0.2)

## 2023-12-12 LAB — COMPREHENSIVE METABOLIC PANEL WITH GFR
ALT: 36 U/L (ref 0–44)
AST: 30 U/L (ref 15–41)
Albumin: 3 g/dL — ABNORMAL LOW (ref 3.5–5.0)
Alkaline Phosphatase: 65 U/L (ref 38–126)
Anion gap: 8 (ref 5–15)
BUN: 16 mg/dL (ref 8–23)
CO2: 21 mmol/L — ABNORMAL LOW (ref 22–32)
Calcium: 9.3 mg/dL (ref 8.9–10.3)
Chloride: 109 mmol/L (ref 98–111)
Creatinine, Ser: 0.84 mg/dL (ref 0.44–1.00)
GFR, Estimated: >60 mL/min (ref >60)
Glucose, Bld: 100 mg/dL — ABNORMAL HIGH (ref 70–99)
Potassium: 4 mmol/L (ref 3.5–5.1)
Sodium: 138 mmol/L (ref 135–145)
Total Bilirubin: 0.6 mg/dL (ref 0.0–1.2)
Total Protein: 6.1 g/dL — ABNORMAL LOW (ref 6.5–8.1)

## 2023-12-12 MED ORDER — BETHANECHOL CHLORIDE 10 MG PO TABS: 10.0000 mg | ORAL_TABLET | Freq: Three times a day (TID) | ORAL | Status: DC

## 2023-12-12 NOTE — Progress Notes (Signed)
 Physical Therapy Session Note  Patient Details  Name: Andrea Bridges MRN: [id number] Date of Birth: [birth year]  Today's Date: 12/12/2023 PT Individual Time: 1310-1420 PT Individual Time Calculation (min): 70 min   Short Term Goals: Week 1:  PT Short Term Goal 1 (Week 1): STGs = LTGs  Skilled Therapeutic Interventions/Progress Updates:    Pt presents in room in bed, agreeable to PT. Pt denies pain at this time. Session focused on vitals management with pt demonstrating low blood pressure in supine and throughout session with emphasis on education and activities for nonpharmacological hemodynamic support. Vitals noted below. Therapist provides TIS WC due to low BP. Therapist dons thigh high TED hose and ace wraps bilaterally total assist. Pt completes therapeutic exercises to increase BP prior to bed mobility including: - ankle pumps 60 seconds (tactile and verbal cues for full ROM) - supine marches x20 alternating BLE - supine bridges x10 Pt completes bed mobility with supervision, increased time to decrease orthostatic hypotension. Pt provided with education on increasing fluid intake throughout session for hemodynamic support. Pt completes transfer with CGA to WC with RW. Pt transported to day room. Pt completes stand step transfer to nustep with RW CGA. Pt tolerates 5 min on nustep BUE/BLE L1 with cues for increasing speed with pt demonstrating self selected speed 30 SPM. Pt returned to room and completes stand step transfer back to bed with RW CGA. Pt completes bed mobility with supervision. TED hose and ace wraps doffed and pt remains semireclined in bed with all needs wtihin reach, call light in place, and bed alarm activated at end of session. Pt family at bedside.  Vitals: Supine at rest: BP 78/37 (49), HR 84bpm Supine with TED hose and ACE wraps: BP 87/50 Supine following exercises: BP 94/62 (73), HR 80   Therapy Documentation Precautions:  Precautions Precautions: Fall Recall of  Precautions/Restrictions: Impaired Precaution/Restrictions Comments: SBP <160 Restrictions Weight Bearing Restrictions Per Provider Order: No    Therapy/Group: Individual Therapy  Reche Ohara PT, DPT 12/12/2023, 4:03 PM

## 2023-12-12 NOTE — IPOC Note (Signed)
 Overall Plan of Care [facility]) Patient Details Name: Andrea Bridges MRN: [id number] DOB: 22-Apr-1961  Admitting Diagnosis: Nontraumatic subcortical hemorrhage of left cerebral hemisphere [facility])  Hospital Problems: Principal Problem:   Nontraumatic subcortical hemorrhage of left cerebral hemisphere [facility])     Functional Problem List: Nursing Bladder, Bowel, Edema, Endurance, Medication Management, Motor, Pain, Safety  PT Balance, Endurance, Motor, Perception, Safety, Sensory  OT Balance, Cognition, Endurance, Pain, Safety  SLP Cognition  TR         Basic ADL's: OT Bathing, Dressing, Toileting     Advanced  ADL's: OT       Transfers: PT Bed Mobility, Bed to Chair, Customer service manager, Tub/Shower     Locomotion: PT Ambulation, Stairs     Additional Impairments: OT None  SLP Social Cognition   Problem Solving, Attention, Memory, Awareness  TR      Anticipated Outcomes Item Anticipated Outcome  Self Feeding    Swallowing      Basic self-care  Supervision  Toileting  Supervision   Bathroom Transfers Supervision  Bowel/Bladder  manage bowel with medications/ manage bladder with toileting assistance  Transfers  S transfers  Locomotion  S gait and S stairs  Communication     Cognition  minA  Pain  <4 w/ prns  Safety/Judgment  manage safety with supervision assistance   Therapy Plan: PT Intensity: Minimum of 1-2 x/day ,45 to 90 minutes PT Frequency: 5 out of 7 days PT Duration Estimated Length of Stay: 7 to 10 days OT Intensity: Minimum of 1-2 x/day, 45 to 90 minutes OT Frequency: 5 out of 7 days OT Duration/Estimated Length of Stay: 7-10 days SLP Intensity: Minumum of 1-2 x/day, 30 to 90 minutes SLP Frequency: 3 to 5 out of 7 days SLP Duration/Estimated Length of Stay: 7-10 days   Team Interventions: Nursing Interventions Patient/Family Education, Pain Management, Bladder Management, Bowel Management, Disease Management/Prevention, Medication Management,  Discharge Planning  PT interventions Ambulation/gait training, Balance/vestibular training, Cognitive remediation/compensation, DME/adaptive equipment instruction, Discharge planning, Functional mobility training, Neuromuscular re-education, Patient/family education, Stair training, Splinting/orthotics, Therapeutic Activities, Therapeutic Exercise, UE/LE Strength taining/ROM, UE/LE Coordination activities, Wheelchair propulsion/positioning  OT Interventions Balance/vestibular training, Cognitive remediation/compensation, Community reintegration, Discharge planning, Disease mangement/prevention, DME/adaptive equipment instruction, Functional electrical stimulation, Functional mobility training, Neuromuscular re-education, Pain management, Patient/family education, Psychosocial support, Self Care/advanced ADL retraining, Skin care/wound managment, Splinting/orthotics, Therapeutic Activities, Therapeutic Exercise, UE/LE Strength taining/ROM, Visual/perceptual remediation/compensation, UE/LE Coordination activities  SLP Interventions Cognitive remediation/compensation, Cueing hierarchy, Functional tasks, Internal/external aids, Patient/family education, Therapeutic Activities  TR Interventions    SW/CM Interventions Discharge Planning, Psychosocial Support, Patient/Family Education   Barriers to Discharge MD  Medical stability, Home enviroment access/loayout, Wound care, Lack of/limited family support, Insurance for SNF coverage, and Behavior  Nursing Decreased caregiver support, Home environment access/layout, Incontinence Discharge: House  Discharge Home Layout: Two level, 1/2 bath on main level, Other (Comment) (can set up downstairs office as bedroom)  Alternate Level Stairs-Number of Steps: full flight  PT Decreased caregiver support, Inaccessible home environment    OT Decreased caregiver support, Wound Care    SLP      SW Decreased caregiver support, Lack of/limited family support, Community education officer for  SNF coverage     Team Discharge Planning: Destination: PT-Home ,OT- Home , SLP-Home Projected Follow-up: PT-Home health PT, OT-  Outpatient OT, SLP-Outpatient SLP Projected Equipment Needs: PT-To be determined, OT- To be determined, SLP-None recommended by SLP Equipment Details: PT- , OT-  Patient/family involved in discharge planning: PT- Patient, Family  member/caregiver,  OT-Patient, Family member/caregiver, SLP-Patient, Family member/caregiver  MD ELOS: 10 to 14 days Medical Rehab Prognosis: Good Assessment: The patient has been admitted for CIR therapies with the diagnosis of nontraumatic subarachnoid hemorrhage. The team will be addressing functional mobility, strength, stamina, balance, safety, adaptive techniques and equipment, self-care, bowel and bladder mgt, patient and caregiver education,. Goals have been set at supervision PT/OT/SLP. Anticipated discharge destination is home.       See Team Conference Notes for weekly updates to the plan of care

## 2023-12-12 NOTE — Care Management (Signed)
 Inpatient Rehabilitation Center Individual Statement of Services  Patient Name:  Andrea Bridges  Date:  12/12/2023  Welcome to the Inpatient Rehabilitation Center.  Our goal is to provide you with an individualized program based on your diagnosis and situation, designed to meet your specific needs.  With this comprehensive rehabilitation program, you will be expected to participate in at least 3 hours of rehabilitation therapies Monday-Friday, with modified therapy programming on the weekends.  Your rehabilitation program will include the following services:  Physical Therapy (PT), Occupational Therapy (OT), Speech Therapy (ST), 24 hour per day rehabilitation nursing, Therapeutic Recreaction (TR), Psychology, Neuropsychology, Care Coordinator, Rehabilitation Medicine, Nutrition Services, Pharmacy Services, and Other  Weekly team conferences will be held on Tuesday to discuss your progress.  Your Inpatient Rehabilitation Care Coordinator will talk with you frequently to get your input and to update you on team discussions.  Team conferences with you and your family in attendance may also be held.  Expected length of stay: 7-10 days    Overall anticipated outcome: Supervision  Depending on your progress and recovery, your program may change. Your Inpatient Rehabilitation Care Coordinator will coordinate services and will keep you informed of any changes. Your Inpatient Rehabilitation Care Coordinator's name and contact numbers are listed  below.  The following services may also be recommended but are not provided by the Inpatient Rehabilitation Center:  Driving Evaluations Home Health Rehabiltiation Services Outpatient Rehabilitation Services Vocational Rehabilitation   Arrangements will be made to provide these services after discharge if needed.  Arrangements include referral to agencies that provide these services.  Your insurance has been verified to be:  [employer]  Your primary  doctor is:  [name]  Pertinent information will be shared with your doctor and your insurance company.  Inpatient Rehabilitation Care Coordinator:  Graeme Feliciana SILK [phone] or [phone]  Information discussed with and copy given to patient by: Graeme DELENA Feliciana, 12/12/2023, 12:33 PM

## 2023-12-12 NOTE — Progress Notes (Signed)
 Inpatient Rehabilitation Care Coordinator Assessment and Plan Patient Details  Name: Elza Varricchio MRN: [id number] Date of Birth: 07-21-1960  Today's Date: 12/12/2023  Hospital Problems: Principal Problem:   Nontraumatic subcortical hemorrhage of left cerebral hemisphere [facility])  Past Medical History:  Past Medical History:  Diagnosis Date   Acute appendicitis with perforation and localized peritonitis, without abscess or gangrene 12/11/2019   Anxiety    Infertility, female    PCOS (polycystic ovarian syndrome)    Skin cancer    basal cell   SOBOE (shortness of breath on exertion) 09/08/2022   Past Surgical History:  Past Surgical History:  Procedure Laterality Date   APPENDECTOMY  2021   BREAST BIOPSY     IR ANGIO INTRA EXTRACRAN SEL INTERNAL CAROTID BILAT MOD SED  11/29/2023   IR ANGIO VERTEBRAL SEL VERTEBRAL BILAT MOD SED  11/29/2023   IR ANGIO VERTEBRAL SEL VERTEBRAL UNI R MOD SED  11/22/2023   IR US  GUIDE VASC ACCESS RIGHT  11/29/2023   Social History:  reports that she has been smoking cigarettes and e-cigarettes. She has never used smokeless tobacco. She reports that she does not currently use alcohol. She reports that she does not use drugs.  Family / Support Systems Marital Status: Single Patient Roles: Parent Spouse/Significant Other: N/A Children: DtrGLENWOOD Palma Other Supports: Brother- David Anticipated Caregiver: Brother-David Ability/Limitations of Caregiver: Pt will likely d/c to her brother David's home in [place] with support from his family. Caregiver Availability:  (TBD) Family Dynamics: Pt lives alone.  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 Education: high school grad Health Literacy - How often do you need to have someone help you when you read instructions, pamphlets, or other written material from your doctor or pharmacy?: Never Writes:  Yes Employment Status: Employed Length of Employment:  (6 years) Return to Work Plans: family reports all FMLA forms done have been completed. Legal History/Current Legal Issues: Denies Guardian/Conservator: N/A   Abuse/Neglect Abuse/Neglect Assessment Can Be Completed: Yes Physical Abuse: Denies Verbal Abuse: Denies Sexual Abuse: Denies Exploitation of patient/patient's resources: Denies Self-Neglect: Denies  Patient response to: Social Isolation - How often do you feel lonely or isolated from those around you?: Rarely  Emotional Status Pt's affect, behavior and adjustment status: Pt in good spirits at time of visit. Some mild comprehension/confusion noted during assessment, and family assisted with answering some of SW questions. Recent Psychosocial Issues: Denies Psychiatric History: Denies Substance Abuse History: Pt reports se quit smoking cigarettes 10 yrs ago and has now been vaing for 4 years. Seldom use of etoh use. Denies rec drug use.  Patient / Family Perceptions, Expectations & Goals Pt/Family understanding of illness & functional limitations: Pt and family have a general understanding of care needs Premorbid pt/family roles/activities: Independent Anticipated changes in roles/activities/participation: Assistance with ADLs/IADLs Pt/family expectations/goals: Pt will need to work on being as independent as possible.  Community Resources [employer]: None Premorbid Home Care/DME Agencies: None Transportation available at discharge: TBD Is the patient able to respond to transportation needs?: Yes In the past 12 months, has lack of transportation kept you from medical appointments or from getting medications?: No In the past 12 months, has lack of transportation kept you from meetings, work, or from getting things needed for daily living?: No Resource referrals recommended: Neuropsychology  Discharge Planning Living Arrangements: Alone Support Systems:  Children, Other relatives Type of Residence: Private residence Insurance Resources: Private  Insurance (specify) (Aetna/[employer]) Financial Resources: Employment Financial Screen Referred: No Living Expenses: Own Money Management: Patient Does the patient have any problems obtaining your medications?: No Home Management: Pt manages allhome care needs Patient/Family Preliminary Plans: TBD Care Coordinator Barriers to Discharge: Decreased caregiver support, Lack of/limited family support, Insurance for SNF coverage Care Coordinator Anticipated Follow Up Needs: HH/OP Expected length of stay: 7-10 days  Clinical Impression SW met with pt in room to introduce self, explain role, and discuss discharge process. Her dtr [name] and brother Alm were present at time of visit. Pt is not a Cytogeneticist. No HCPOA. No DME. It is still to be determined the discharge location, however, likely going home wit her brother Alm who lives in [place]; d/c address: [address] [place], [place], [place] [address].  Emerson Barretto A Osamah Schmader 12/12/2023, 3:35 PM

## 2023-12-12 NOTE — Progress Notes (Signed)
 PROGRESS NOTE   Subjective/Complaints:  No events overnight.  Noted the evening prior to be restless, with drainage on the pillow.  As needed Fioricet  was given.  Per nursing, Dr. Lanis came by yesterday, advised continued monitoring as fluid needs to drain somewhere.  Will come back by and see patient today.  She remains afebrile, without chills, nausea, vomiting, vision changes.  No current headache, but does get headaches right over the surgical site.  Vitals stable     12/12/2023    4:34 AM 12/11/2023    8:25 PM 12/11/2023    3:28 PM  Vitals with BMI  Systolic 102 102 898  Diastolic 66 66 58  Pulse 64 87 72   Labs look improved from prior; sodium is normalized, potassium is normalized, CBC is stable.  Foley moved yesterday, bladder scan with intermittent straight caths for 400 and 500 yesterday.  She says she is having the urge to pee.  Last BM 8-3, small   ROS:  Pt denies SOB, abd pain, CP, N/V/C/D, and vision changes  (+) drainage from head- is clear + Urinary retention  Objective:   No results found. Recent Labs    12/09/23 2040 12/12/23 0523  WBC 7.4 5.6  HGB 11.1* 11.2*  HCT 33.4* 33.7*  PLT 363 305   Recent Labs    12/09/23 2040 12/12/23 0523  NA  --  138  K  --  4.0  CL  --  109  CO2  --  21*  GLUCOSE  --  100*  BUN  --  16  CREATININE 0.76 0.84  CALCIUM  --  9.3    Intake/Output Summary (Last 24 hours) at 12/12/2023 9177 Last data filed at 12/12/2023 0432 Gross per 24 hour  Intake 360 ml  Output 1475 ml  Net -1115 ml        Physical Exam: Vital Signs Blood pressure 102/66, pulse 64, temperature 98.3 F (36.8 C), temperature source Oral, resp. rate 17, SpO2 100%.  General: awake, alert, relaxed, no acute distress.   HENT: conjugate gaze; oropharynx moist-bur hole site open to air.  No obvious drainage.  staples and sutures on EVD site-intact. CV: regular rate and rhythm; no  JVD Pulmonary: CTA B/L; no W/R/R- good air movement GI: soft, NT, ND, (+)BS Psychiatric: appropriate mood and affect GU- foley out  Neuro:   Mental Status: Alert and oriented x4,  Following commands.  + Memory deficits, concentration difficulty, poor insight  Cranial nerves II through XII intact Moves all 4 extremities equally antigravity against resistance, 4 out of 5 throughout REFLEXES: DTR 2+ patella and Achilles bilaterally, no beats of clonus b/l ankles SENSORY: Normal to touch all 4 extremities   MSK: no joint swelling noted    Assessment/Plan: 1. Functional deficits which require 3+ hours per day of interdisciplinary therapy in a comprehensive inpatient rehab setting. Physiatrist is providing close team supervision and 24 hour management of active medical problems listed below. Physiatrist and rehab team continue to assess barriers to discharge/monitor patient progress toward functional and medical goals  Care Tool:  Bathing    Body parts bathed by patient: Right arm, Left arm, Chest, Abdomen, Front  perineal area, Buttocks, Right upper leg, Left upper leg, Right lower leg, Left lower leg, Face         Bathing assist Assist Level: Contact Guard/Touching assist     Upper Body Dressing/Undressing Upper body dressing   What is the patient wearing?: Bra, Pull over shirt    Upper body assist Assist Level: Supervision/Verbal cueing    Lower Body Dressing/Undressing Lower body dressing      What is the patient wearing?: Underwear/pull up, Pants     Lower body assist Assist for lower body dressing: Contact Guard/Touching assist     Toileting Toileting    Toileting assist Assist for toileting: Contact Guard/Touching assist     Transfers Chair/bed transfer  Transfers assist     Chair/bed transfer assist level: Moderate Assistance - Patient 50 - 74%     Locomotion Ambulation   Ambulation assist      Assist level: Moderate Assistance - Patient 50  - 74% Assistive device: Hand held assist Max distance: 72   Walk 10 feet activity   Assist     Assist level: Moderate Assistance - Patient - 50 - 74% Assistive device: Hand held assist   Walk 50 feet activity   Assist    Assist level: Moderate Assistance - Patient - 50 - 74% Assistive device: Hand held assist    Walk 150 feet activity   Assist Walk 150 feet activity did not occur: Safety/medical concerns         Walk 10 feet on uneven surface  activity   Assist Walk 10 feet on uneven surfaces activity did not occur: Safety/medical concerns         Wheelchair     Assist Is the patient using a wheelchair?: No Type of Wheelchair: Manual    Wheelchair assist level: Supervision/Verbal cueing Max wheelchair distance: 50    Wheelchair 50 feet with 2 turns activity    Assist        Assist Level: Supervision/Verbal cueing   Wheelchair 150 feet activity     Assist      Assist Level: Dependent - Patient 0%   Blood pressure 102/66, pulse 64, temperature 98.3 F (36.8 C), temperature source Oral, resp. rate 17, SpO2 100%.  Medical Problem List and Plan: 1. Functional deficits secondary to nontraumatic subarachnoid hemorrhage with hydrocephalus status post EVD removed 7/27 and initial burr hole             -patient may shower, cover incision please             -ELOS/Goals: 10-14 days, PT/OT/SLP sup            Con't CIR PT OT and SLP   2.  Antithrombotics: -DVT/anticoagulation:  Pharmaceutical: Lovenox              -antiplatelet therapy: N/A 3. Pain Management: Topamax  25 mg twice daily, Fioricet  as needed, oxycodone  5 mg every 4 hours as needed pain  8/2- needed Fioricet  and tylenol  for HA's today- con't regimen  8/3- HA a little more today- 5-6/10 but she reported this yesterday- will add Lidoderm  patches for low back/buttocks pain 8pm to 8am- explained canot add steroids secondary to NSU issues 4. Mood/Behavior/Sleep: Provide emotional  support             -antipsychotic agents: N/A 5. Neuropsych/cognition: This patient is not capable of making decisions on her own behalf. 6. Skin/Wound Care: Routine skin checks  8/2- although sutures are 3 weeks, will not remove until [facility]  by NSU due to concern about fluid drainage- is clear- not enough right now to test for CSF- d/w nursing as well as NICU nursing and NSU-also con't Staples from EVD site and monitor-  NSU will come see tomorrow during rounds  8/3- appears to be a CSF leak, base don  halo- NSU will see tomorrow- don't see note so far, going off nursing report. See below for more in depth  8-4: Neurosurgery, Dr. Lanis saw yesterday, advised no changes given no signs of infection or CSF headache.  Will come back by today.  7. Fluids/Electrolytes/Nutrition: Routine in and outs with follow-up chemistries--stable  - 8-4: P.o. intakes poor, noted started on Remeron  over the weekend.  Will monitor for now.  8.  Seizure prophylaxis.  Keppra  500 mg twice daily.  EEG negative 9.  Fevers/E. coli UTI/urinary retention.  7-day course Ancef  completed.  Blood cultures/CSF no growth to date and placed on Bactrim  DS 1 tablet every 12 hours x 10 doses 12/08/2023 for possible UTI.  Currently on airborne and contact precautions pending COVID/RVP results. - 8/2- off precautions   Continue Flomax  0.8 mg daily, Urecholine  10 mg 3 times daily.   - Consider voiding trial in 2 to 3 days  8/2- will leave foley until Sunday/Monday- pt overwhelmed by the idea of in/out cath yet- will let her get used to CIR before removing- x1 day  8/3- foley removed- ordered to bladder scan q6 hours and cath if volmes >350cc- with lidocaine - went over with husband and pt- she's agreeable.   - 8-4: Requiring intermittent straight caths, bethanechol  stopped due to hypotension; continue monitoring today  10.  Incidental finding of 7 mm nodular density in the medial right lung base.  Follow-up outpatient.  11.   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  Sodium 131-133.  Serum osmolality 289 urine osmolality 740 urine sodium 80.  Continue salt tablets 2 g 3 times daily.  Avoid hypotonic fluids.  8/2- Na better 134--normalized 8-4  12.  Vasospasm Prevention.  Nimodipine  60 mg every 4 hours. NSGY note indicates for 21 days (end date 8/3) 13.  Tobacco abuse.  NicoDerm patch.  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 Prior to this, husband reports was planning on  stopping vaping, so don't want to encourage that.  8-4: No apparent picking today.  14.  Overweight/Class I obesity.  Last BMI 29.80.  Dietary follow-up 15. Hypokalemia  8/2- K+ 3.4- will replete with 40 Meq x1.   Normalized 8-4  16. Vasospasms- on Nimodipine - but was hypotensive-  8/2- will con't Nimodipine  however will add midodrine  2.5 mg TID as needed for soft BP- will also order TEDs and ACE wraps if needed.  8/3- BP still soft- like running 90's systolic- will increase midodrine  to 5 mg TID prn- for SBP <100- shouldn't need once Nimodipine  is finished, hopefully  17. Insomnia/restless at night  8/3- added Remeron  since bladder issues, cannot do Trazadone etc, and has decreased appetite.   8-4: Sleep improved last night.  Monitor.   LOS: 3 days A FACE TO FACE EVALUATION WAS PERFORMED  Joesph JAYSON Likes 12/12/2023, 8:22 AM

## 2023-12-12 NOTE — Progress Notes (Signed)
 Rested better. At 2220, up to BR, unable to void, bladder scan=576. I & O cath=400cc's. At 0430, attempted to void again, bladder scan=398cc's, I & O cath=400cc's. No drainage thus far, from scalp incisions. Sutures and staples in place to scalp. One attempt OOB without assistance. Bed alarm in place to alert staff. Geraline Halberstadt A

## 2023-12-12 NOTE — Progress Notes (Signed)
 Speech Language Pathology Daily Session Note  Patient Details  Name: Andrea Bridges MRN: [id number] Date of Birth: November 07, 1960  Today's Date: 12/12/2023 SLP Individual Time: 1103-1200 SLP Individual Time Calculation (min): 57 min  Short Term Goals: Week 1: SLP Short Term Goal 1 (Week 1): STG = LTG due to ELOS  Skilled Therapeutic Interventions: Skilled therapy session focused on cognitive goals. SLP targeted orientation goals by introducing calendar and encouraging patient to recall initial admission date, admission to rehab, and current date w/ use of external aid. Patient did so with supervision-minA. SLP facilitated completion of medication management task to encourage use of problem solving, memory and organizational skills. SLP provided minA for patient to complete BID pillbox according to written and verbalized directions. SLP educated patient on continuing to utilize pillbox upon d/c to aid in planning, organization, and memory. Patient verbalized understanding. SLP continued to challenge patient through identification of medication mistakes task. Patient required minA to identify errors in BID pillbox and correct according to directions. At the end of the session, patient completed memory book given minA. Patient left in chair with alarm set and call bell in reach. Continue POC.   Pain 5/10 back pain - no intervention requested   Therapy/Group: Individual Therapy  Isaura Schiller M.A., CCC-SLP 12/12/2023, 7:54 AM

## 2023-12-12 NOTE — Progress Notes (Addendum)
 Occupational Therapy Session Note  Patient Details  Name: Andrea Bridges MRN: [id number] Date of Birth: [birth year]  Today's Date: 12/12/2023 OT Individual Time: 1000-1103 OT Individual Time Calculation (min): 63 min  and Today's Date: 12/12/2023 OT Missed Time: 10 Minutes Missed Time Reason: Nursing care   Short Term Goals: Week 1:  OT Short Term Goal 1 (Week 1): STGs=LTGs due to patient's estimated length of stay.  Skilled Therapeutic Interventions/Progress Updates:  Pt greeted resting in bed for skilled OT session with focus on BADL retraining and functional transfers.   Pain: Pt with reports of headache pain, LPN administering medications during session. OT offering intermediate rest breaks and positioning suggestions throughout session to address pain/fatigue and maximize participation/safety in session.   Functional Transfers: Bed mobility with supervision + bed features. Sit<>stands during care with CGA + no AD. Ambulatory toilet transfer with Min A + no AD.   Self Care Tasks: Pt completes the following self care tasks with levels of assistance noted below, UB: Bathing/dressing with supervision, direct verbal cuing required to transition to LB. Pt able to re-orient garments to don appropriately.  LB: Bathing with CGA-Min A for standing care. Questioning cues provided for attention to dressing tasks as patient attempts to don shorts over shorts. Max A for B TEDs, slip on shoes with setup.  Toileting attempted with no success, 3/3 tasks with CGA-Min A, cathing completed during session due to feeling of pressure. Re-attempted for BM with no success.  Of note, pt engaging in biographical conversation with OT during the above tasks potential impacting performance, demo's deficits in selective attention.  Vitals: Sitting EOB: BP=95/64 Post-activity: BP=90/55 OT dons thigh high TEDs  Pt requires orientation for time of day.    Pt remained sitting in WC, posey belt activated, door open, 4Ps  assessed and immediate needs met. Pt continues to be appropriate for skilled OT intervention to promote further functional independence in ADLs/IADLs.   Therapy Documentation Precautions:  Precautions Precautions: Fall Recall of Precautions/Restrictions: Impaired Precaution/Restrictions Comments: SBP <160 Restrictions Weight Bearing Restrictions Per Provider Order: No   Therapy/Group: Individual Therapy  [name], OTR/L, MSOT  12/12/2023, 6:23 AM

## 2023-12-12 NOTE — Progress Notes (Signed)
 Inpatient Rehabilitation  Patient information reviewed and entered into eRehab system by Cheri Rous, OTR/L, Rehab Quality Coordinator.   Information including medical coding, functional ability and quality indicators will be reviewed and updated through discharge.

## 2023-12-12 NOTE — Progress Notes (Signed)
 Met with patient and daughter to review current situation,team  conference and plan of care. Reviewed medications, I and O cath, pain, blood pressure changes. Continue to follow along to provide educational needs to facilitate preparation for discharge.

## 2023-12-13 MED ORDER — TAMSULOSIN HCL 0.4 MG PO CAPS
0.4000 mg | ORAL_CAPSULE | Freq: Every day | ORAL | Status: DC
  Administered 2023-12-13 – 2023-12-14 (×2): 0.4 mg via ORAL
  Filled 2023-12-13: qty 1

## 2023-12-13 MED ORDER — ACETAMINOPHEN 325 MG PO TABS
650.0000 mg | ORAL_TABLET | Freq: Three times a day (TID) | ORAL | Status: DC
  Administered 2023-12-13 – 2023-12-21 (×31): 650 mg via ORAL
  Filled 2023-12-13 (×24): qty 2

## 2023-12-13 MED ORDER — BUTALBITAL-APAP-CAFFEINE 50-325-40 MG PO TABS
1.0000 | ORAL_TABLET | Freq: Four times a day (QID) | ORAL | Status: DC | PRN
  Administered 2023-12-14 – 2023-12-20 (×10): 1 via ORAL
  Filled 2023-12-13 (×7): qty 1

## 2023-12-13 MED ORDER — TOPIRAMATE 25 MG PO TABS
50.0000 mg | ORAL_TABLET | Freq: Two times a day (BID) | ORAL | Status: DC
  Administered 2023-12-13 – 2023-12-15 (×4): 50 mg via ORAL
  Filled 2023-12-13 (×4): qty 2

## 2023-12-13 MED ORDER — SODIUM CHLORIDE 0.9 % IV SOLN: INTRAVENOUS | Status: AC

## 2023-12-13 NOTE — Progress Notes (Signed)
 Physical Therapy Session Note  Patient Details  Name: Andrea Bridges MRN: [id number] Date of Birth: [birth year]  Today's Date: 12/13/2023 PT Individual Time: 0807-0900 PT Individual Time Calculation (min): 53 min   Short Term Goals: Week 1:  PT Short Term Goal 1 (Week 1): STGs = LTGs  Skilled Therapeutic Interventions/Progress Updates:     Pt received supine in bed and agrees to therapy. Pt reports feeling stir crazy and feeling restricted with bed alarms and other regulations at CIR. PT provides active listening as well as education on purpose of alarms and regulations and pt voices understanding. PT provides assistance to don thigh high ted hose and pt performs bridging to help pull up pants. Supine to sit with cues for sequencing and positioning. Pt performs stand step to [facility] with minA and cues for positioning. WC transport to gym. Pt completes x20 total LAQs with alternating legs to prepare for ambulation. Pt stands with CGA/minA and ambulates x175' without AD, with minA and cues for upright gaze to improve balance, as well as postural stability. Seated rest break. Pt ambulates x175' with cue to ambulate more quickly and with increased trunk rotation and arm swing. Pt initially requires CGA but minA with increased distance and fatigue, with increased postural sway noted as well as tendency to become externally distracted, negatively impacting safety and balance. Pt rates perceived exertion at 5/10. Following rest break, pt ambulates x350' with similar assistance and cues provided, and rates perceived exertion at 7/10 following bout. Following rest break, pt performs alternating step ups on 3 step without upper extremity support. Pt requires increased cueing for correct sequencing as well cueing to perform larger amplitude movements with LLE>RLE. PT provides minA initially and modA as pt fatigues. Pt completes total of x20 step ups. WC transport back to room. Left seated with all needs within reach.    Therapy Documentation Precautions:  Precautions Precautions: Fall Recall of Precautions/Restrictions: Impaired Precaution/Restrictions Comments: SBP <160 Restrictions Weight Bearing Restrictions Per Provider Order: No    Therapy/Group: Individual Therapy  Elsie JAYSON Dawn, PT, DPT 12/13/2023, 5:06 PM

## 2023-12-13 NOTE — Progress Notes (Signed)
 Occupational Therapy Session Note  Patient Details  Name: Andrea Bridges MRN: [id number] Date of Birth: 1960/08/04  Today's Date: 12/13/2023 OT Individual Time: 1105-1130 OT Individual Time Calculation (min): 25 min    Short Term Goals: Week 1:  OT Short Term Goal 1 (Week 1): STGs=LTGs due to patient's estimated length of stay.  Skilled Therapeutic Interventions/Progress Updates:  Pt greeted resting in bed, daughter at bedside, skilled OT session with focus on hemodynamic assessment/stability with transitional movements and functional mobility. Pt's vitas noted below, patient completing 1x20 reps of ankle pumps, marches, and leg extensions in supine prior to mobility. Once seated at EOB, pt once again completes 1x10-20 reps of calf raises and LAQs. Sit<>stand with CGA. Functional mobility >149ft for general conditioning with CGA/Min A+ no AD, improved step length, cuing required for forward gaze. Daughter providing WC follow. Pt remained in care of NT for toileting.    Vitals: Supine (with TEDs): BP=81/48 Supine (post BLE exercises): BP=95/53 Sitting EOB (with abdominal binder/post BLE exercises): BP=98/60 Sitting post ambulation: BP=101/68   Therapy Documentation Precautions:  Precautions Precautions: Fall Recall of Precautions/Restrictions: Impaired Precaution/Restrictions Comments: SBP <160 Restrictions Weight Bearing Restrictions Per Provider Order: No   Therapy/Group: Individual Therapy  [name], OTR/L, MSOT  12/13/2023, 6:24 AM

## 2023-12-13 NOTE — Progress Notes (Signed)
 Occupational Therapy Session Note  Patient Details  Name: Andrea Bridges MRN: [id number] Date of Birth: Dec 22, 1960  Today's Date: 12/13/2023 OT Individual Time: 8693-8592 OT Individual Time Calculation (min): 61 min    Short Term Goals: Week 1:  OT Short Term Goal 1 (Week 1): STGs=LTGs due to patient's estimated length of stay.  Skilled Therapeutic Interventions/Progress Updates:    Pt received supine with no c/o pain, agreeable to OT session. Discussed possible shower pending hemodynamic stability. Skilled monitoring of vitals throughout session to ensure hemodynamic and cardiorespiratory stability. Thigh high teds donned. BP EOB 101/64, HR 83 bpm. She sat for several minutes, CSW rounding, and then it was checked again. BP 90/58. 79 HR bpm. She completed UB ADLs, including bathing and dressing with (S). LB with close (S) sitting, requiring min cueing to sit down to don/doff underwear. Following ADLs BP EOB with ace wrap and ted hose 98/52, HR 84 bpm. Abdominal binder on. She completed 200 ft of functional mobility to the therapy gym with her daughter providing w/c follow. She took a prolonged seated rest break and then completed another 200 ft of functional mobility holding two 6 lb dumbbells to grade up balance and functional activity tolerance demands. Mobility performed to challenge dynamic standing balance for carryover to ADL transfers. She returned to her room. Pt was left sitting up in the TIS with all needs met, chair alarm set, and call bell within reach.    Therapy Documentation Precautions:  Precautions Precautions: Fall Recall of Precautions/Restrictions: Impaired Precaution/Restrictions Comments: SBP <160 Restrictions Weight Bearing Restrictions Per Provider Order: No  Therapy/Group: Individual Therapy  Nena VEAR Moats 12/13/2023, 1:19 PM

## 2023-12-13 NOTE — Progress Notes (Addendum)
 PROGRESS NOTE   Subjective/Complaints:  No events overnight.  No acute complaints.  Got up today with minimal dizziness, but is complaining of ongoing ongoing headache, not relieved or change with positioning.  Daughter at bedside, no additional questions or concerns. Per therapies was orthostatic into the 70s yesterday supine yesterday Continues to need intermittent straight cathing for most voids.  Nursing reports no further drainage from her head.  ROS:  Pt denies SOB, abd pain, CP, N/V/C/D, and vision changes + Headache + Urinary retention  Objective:   No results found. Recent Labs    12/12/23 0523  WBC 5.6  HGB 11.2*  HCT 33.7*  PLT 305   Recent Labs    12/12/23 0523  NA 138  K 4.0  CL 109  CO2 21*  GLUCOSE 100*  BUN 16  CREATININE 0.84  CALCIUM 9.3    Intake/Output Summary (Last 24 hours) at 12/13/2023 1054 Last data filed at 12/13/2023 0900 Gross per 24 hour  Intake 480 ml  Output 720 ml  Net -240 ml        Physical Exam: Vital Signs Blood pressure 104/64, pulse 73, temperature 97.6 F (36.4 C), temperature source Oral, resp. rate 20, SpO2 99%.  General: awake, alert, relaxed, no acute distress.  Ambulating with nursing from the toilet. HENT: conjugate gaze; oropharynx moist-bur hole site open to air.  No obvious drainage.  staples and sutures on EVD site-intact. CV: regular rate and rhythm; no JVD Pulmonary: CTA B/L; no W/R/R- good air movement GI: soft, NT, ND, (+)BS Psychiatric: appropriate mood and affect  Neuro:   Mental Status: Alert and oriented x4,  Following commands.  + Memory deficits, concentration difficulty, poor insight --improving, can spell world backwards and can add change with some significant concentration  Cranial nerves II through XII intact Moves all 4 extremities equally antigravity against resistance, 4 out of 5 throughout REFLEXES: DTR 2+ patella and Achilles  bilaterally, no beats of clonus b/l ankles SENSORY: Normal to touch all 4 extremities   MSK: no joint swelling noted    Assessment/Plan: 1. Functional deficits which require 3+ hours per day of interdisciplinary therapy in a comprehensive inpatient rehab setting. Physiatrist is providing close team supervision and 24 hour management of active medical problems listed below. Physiatrist and rehab team continue to assess barriers to discharge/monitor patient progress toward functional and medical goals  Care Tool:  Bathing    Body parts bathed by patient: Right arm, Left arm, Chest, Abdomen, Front perineal area, Buttocks, Right upper leg, Left upper leg, Right lower leg, Left lower leg, Face         Bathing assist Assist Level: Contact Guard/Touching assist     Upper Body Dressing/Undressing Upper body dressing   What is the patient wearing?: Bra, Pull over shirt    Upper body assist Assist Level: Supervision/Verbal cueing    Lower Body Dressing/Undressing Lower body dressing      What is the patient wearing?: Underwear/pull up, Pants     Lower body assist Assist for lower body dressing: Contact Guard/Touching assist     Toileting Toileting    Toileting assist Assist for toileting: Contact Guard/Touching assist  Transfers Chair/bed transfer  Transfers assist     Chair/bed transfer assist level: Moderate Assistance - Patient 50 - 74%     Locomotion Ambulation   Ambulation assist      Assist level: Moderate Assistance - Patient 50 - 74% Assistive device: Hand held assist Max distance: 72   Walk 10 feet activity   Assist     Assist level: Moderate Assistance - Patient - 50 - 74% Assistive device: Hand held assist   Walk 50 feet activity   Assist    Assist level: Moderate Assistance - Patient - 50 - 74% Assistive device: Hand held assist    Walk 150 feet activity   Assist Walk 150 feet activity did not occur: Safety/medical  concerns         Walk 10 feet on uneven surface  activity   Assist Walk 10 feet on uneven surfaces activity did not occur: Safety/medical concerns         Wheelchair     Assist Is the patient using a wheelchair?: Yes Type of Wheelchair: Manual    Wheelchair assist level: Supervision/Verbal cueing Max wheelchair distance: 50    Wheelchair 50 feet with 2 turns activity    Assist        Assist Level: Supervision/Verbal cueing   Wheelchair 150 feet activity     Assist      Assist Level: Maximal Assistance - Patient 25 - 49%   Blood pressure 104/64, pulse 73, temperature 97.6 F (36.4 C), temperature source Oral, resp. rate 20, SpO2 99%.  Medical Problem List and Plan: 1. Functional deficits secondary to nontraumatic subarachnoid hemorrhage with hydrocephalus status post EVD removed 7/27 and initial burr hole             -patient may shower, cover incision please             -ELOS/Goals: 10-14 days, PT/OT/SLP sup - 12/21/23            Con't CIR PT OT and SLP    8/5: Family can provide 4-6 hours assistance per day. SLP saying will need full supervision.  Progressing well with therapies.   2.  Antithrombotics: -DVT/anticoagulation:  Pharmaceutical: Lovenox              -antiplatelet therapy: N/A 3. Pain Management: Topamax  25 mg twice daily, Fioricet  as needed, oxycodone  5 mg every 4 hours as needed pain  8/2- needed Fioricet  and tylenol  for HA's today- con't regimen  8/3- HA a little more today- 5-6/10 but she reported this yesterday- will add Lidoderm  patches for low back/buttocks pain 8pm to 8am- explained canot add steroids secondary to NSU issues  8-5: Increase Topamax  to 50 mg twice daily.  Scheduled Tylenol  650 mg 3 times daily, reduce Fioricet  to 1 tablet every 6 hours as needed  4. Mood/Behavior/Sleep: Provide emotional support             -antipsychotic agents: N/A 5. Neuropsych/cognition: This patient is not capable of making decisions on her  own behalf. 6. Skin/Wound Care: Routine skin checks  8/2- although sutures are 3 weeks, will not remove until Ok'd by NSU due to concern about fluid drainage- is clear- not enough right now to test for CSF- d/w nursing as well as NICU nursing and NSU-also con't Staples from EVD site and monitor-  NSU will come see tomorrow during rounds  8/3- appears to be a CSF leak, base don  halo- NSU will see tomorrow- don't see note so far, going  off nursing report. See below for more in depth  8-4: Neurosurgery, Dr. Lanis saw yesterday, advised no changes given no signs of infection or CSF headache.  Will come back by today.  8-5: Surgical site looks good, no further drainage reported.  7. Fluids/Electrolytes/Nutrition: Routine in and outs with follow-up chemistries--stable  - 8-4: P.o. intakes poor, noted started on Remeron  over the weekend.  Will monitor for now.  8-5: Eating 50 to 100% of meals; significantly improved.  8.  ?  Seizure.  Keppra  500 mg twice daily.   - Keppra  was initiated 8-1 for possible seizure, negative on EEG.  Will need to continue medication until neurosurgical follow-up  9.  Fevers/E. coli UTI/urinary retention.  7-day course Ancef  completed.  Blood cultures/CSF no growth to date and placed on Bactrim  DS 1 tablet every 12 hours x 10 doses 12/08/2023 for possible UTI.  Currently on airborne and contact precautions pending COVID/RVP results. - 8/2- off precautions   Continue Flomax  0.8 mg daily, Urecholine  10 mg 3 times daily.   - Consider voiding trial in 2 to 3 days  8/2- will leave foley until Sunday/Monday- pt overwhelmed by the idea of in/out cath yet- will let her get used to CIR before removing- x1 day  8/3- foley removed- ordered to bladder scan q6 hours and cath if volmes >350cc- with lidocaine - went over with husband and pt- she's agreeable.   - 8-4: Requiring intermittent straight caths, bethanechol  stopped due to hypotension; continue monitoring today  8-5: Move  Flomax  to nightly for hypotension, reduced to 0.4 mg  10.  Incidental finding of 7 mm nodular density in the medial right lung base.  Follow-up outpatient.  11.  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  Sodium 131-133.  Serum osmolality 289 urine osmolality 740 urine sodium 80.  Continue salt tablets 2 g 3 times daily.  Avoid hypotonic fluids.  8/2- Na better 134--normalized 8-4  12.  Vasospasm Prevention.  Nimodipine  60 mg every 4 hours. NSGY note indicates for 21 days (end date 8/3)  13.  Tobacco abuse.  NicoDerm patch.  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 Prior to this, husband reports was planning on  stopping vaping, so don't want to encourage that.  8-4: No apparent picking today.  14.  Overweight/Class I obesity.  Last BMI 29.80.  Dietary follow-up 15. Hypokalemia  8/2- K+ 3.4- will replete with 40 Meq x1.   Normalized 8-4  16.  Hypotension  8/2- will con't Nimodipine  however will add midodrine  2.5 mg TID as needed for soft BP- will also order TEDs and ACE wraps if needed.  8/3- BP still soft- like running 90's systolic- will increase midodrine  to 5 mg TID prn- for SBP <100- shouldn't need once Nimodipine  is finished, hopefully  8-5: Abdominal binder, teds, move Flomax  to nightly as above.  Ordered 1 L normal saline at 100 cc/h over the course of today for additional support.  Labs in AM.  17. Insomnia/restless at night  8/3- added Remeron  since bladder issues, cannot do Trazadone etc, and has decreased appetite.   8-4: Sleep improved last night.  Monitor.   LOS: 4 days A FACE TO FACE EVALUATION WAS PERFORMED  Joesph JAYSON Likes 12/13/2023, 10:54 AM

## 2023-12-13 NOTE — Patient Care Conference (Signed)
 Inpatient RehabilitationTeam Conference and Plan of Care Update Date: 12/13/2023   Time: 1051 am    [name]      Medical Record Number: [id number]  Date of Birth: 1961-01-16 Sex: Female         Room/Bed: 4W10C/4W10C-01 Payor Info: Payor: [employer] / Plan: [facility] / Product Type: *No Product type* /    Admit Date/Time:  12/09/2023  6:43 PM  Primary Diagnosis:  Nontraumatic subcortical hemorrhage of left cerebral hemisphere [facility])  Hospital Problems: Principal Problem:   Nontraumatic subcortical hemorrhage of left cerebral hemisphere [facility])    Expected Discharge Date: Expected Discharge Date: 12/21/23  Team Members Present: Physician leading conference: Dr. Joesph Likes Social Worker Present: Graeme Jude, LCSW Nurse Present: Eulalio Falls, RN PT Present: Elam Ohara, PT OT Present: [name], OT SLP Present: Recardo Mole, SLP PPS Coordinator present : Eleanor Colon, SLP     Current Status/Progress Goal Weekly Team Focus  Bowel/Bladder   Continent of bowel. Last BM was 8/4. Patient able to void in the toilet at night shift with pvr at 70.  Requiring intermittent caths  Patient to be able to void without needing to be cathed.   Bladder scan q 6hrs to check for urine  retention.    Swallow/Nutrition/ Hydration               ADL's   CGA overall for ADL routine; CGA-Min A for standing balance during care. Barriers: Decreased selective attention, STM, and sequencing. OH.   Supervision   Cognitive retraining, higher level balance, general conditioning    Mobility   supervision bed mob, transfers CGA wtih RW, gait with RW 350'   supervision ambulatory  barriers: orthostatic hypotension; focus on gait training, upright tolerance, activity tolerance, NMR    Communication                Safety/Cognition/ Behavioral Observations  moderate cognitive deficits - notably orientation to time   minA   orientation to time, intellectual  awareness, problem solving, short term memory    Pain   C/o headache. Patient has scheduled topamax  and fiorecet prn.   Pain of 0.   Assess pain q shift prn.    Skin   Patient has a staple to right parietal area and incision to right upper forehead with steri strips.  Head wound drainage  Maintain skin integrity.  Assess skin q shift and prn.      Discharge Planning:  Pt d/c location is likely her brother's home in [place]. D/c locaiton TBD. SW will confirm there are no barriers to discharge.    Team Discussion: Patient was admitted post nontraumatic subarachnoid hemorrhage with hydrocephalus. Patient with headaches/ poor po intake/hypotension/poor sleep: medications adjusted by MD. Progress limited by selective attention, moderate cognitive deficits - notably orientation to time.   Patient on target to meet rehab goals: Currently patient needs CGA for all ADLs. Patient transfers CGA using a RW and able to ambulate up to 350' using RW. Overall goals at discharge are set for supervision - min assistance.   *See Care Plan and progress notes for long and short-term goals.   Revisions to Treatment Plan:  Neurosurgery consult Abdominal binder Teds IV fluids In and Out Catheterization  Teaching Needs: Safety, medications, toileting, transfers, dietary modifications, weight management, etc.   Current Barriers to Discharge: Decreased caregiver support, Home enviroment access/layout, and Weight; Neurogenic bowel and bladder  Possible Resolutions to Barriers: Family Education     Medical Summary  Current Status: Medically complicated by cognitive deficits, head wound drainage, orthostatic hypotension, headache, urinary retention, hyponatremia and poor sleep  Barriers to Discharge: Behavior/Mood;Hypotension;Inadequate Nutritional Intake;Self-care education;Uncontrolled Pain;Medical stability;Incontinence;Neurogenic Bowel & Bladder;Complicated Wound   Possible Resolutions to  [employer] Focus: Neurosurgery assisting with wound management, fluids and medicaiton adjustments for orthostasis, DC foley trial   Continued Need for Acute Rehabilitation Level of Care: The patient requires daily medical management by a physician with specialized training in physical medicine and rehabilitation for the following reasons: Direction of a multidisciplinary physical rehabilitation program to maximize functional independence : Yes Medical management of patient stability for increased activity during participation in an intensive rehabilitation regime.: Yes Analysis of laboratory values and/or radiology reports with any subsequent need for medication adjustment and/or medical intervention. : Yes   I attest that I was present, lead the team conference, and concur with the assessment and plan of the team.   Vicy Medico Gayo 12/13/2023, 1051 am

## 2023-12-13 NOTE — Progress Notes (Signed)
 Patient ID: Andrea Bridges, female   DOB: 07-Sep-1960, 63 y.o.   MRN: [id number]  SW met with pt and pt dtr [name] in room to provide updates from team conference, and d/c date 8/13. Pt dtr will confirm fam edu.  *SW received phone call from pt dtr [name] reporting that fam edu will be on Friday 8am-11am with her uncle, his wife and dr.   Graeme Jude, MSW, LCSW Office: [phone] Cell: [phone] Fax: [phone]

## 2023-12-13 NOTE — Progress Notes (Signed)
 Speech Language Pathology Daily Session Note  Patient Details  Name: Andrea Bridges MRN: [id number] Date of Birth: 01-23-1961  Today's Date: 12/13/2023 SLP Individual Time: 0900-1000 SLP Individual Time Calculation (min): 60 min  Short Term Goals: Week 1: SLP Short Term Goal 1 (Week 1): STG = LTG due to ELOS  Skilled Therapeutic Interventions:   Pt and her ex husband greeted at bedside. She was up in her TIS WC upon SLP arrival and agreeable to tx tasks targeting cognition. SLP facilitated orientation review via calendar. She benefited from minA cues to utilize the calendar and ID current information. She also benefited from s cues to recall events of the morning thus far w/ 90% accuracy. She then completed a written time calculation task targeting sustained attention, information processing, working memory, and calculations. She benefited from maxA overall. Do anticipate perseveration on leg pain/positioning in chair negatively impacted success. She also benefited from maxA for error awareness and correction during time management task. At the end of tx tasks, she was assisted back to bed via stand pivot transfer. She benefited from [facility] for sequencing and safety awareness throughout transfer and required only minA physical assist. She was then left in bed w/ the alarm set and call light within reach. Recommend cont ST per POC.   Pain Pain Assessment Pain Scale: 0-10 Pain Score: 8  Pain Location: Legs Pain Intervention(s): Pain med given for lower pain score than stated, per patient request;Medication (See eMAR)   Therapy/Group: Individual Therapy  [name] 12/13/2023, 9:16 AM

## 2023-12-14 ENCOUNTER — Inpatient Hospital Stay (HOSPITAL_COMMUNITY)

## 2023-12-14 DIAGNOSIS — I69119 Unspecified symptoms and signs involving cognitive functions following nontraumatic intracerebral hemorrhage: Secondary | ICD-10-CM

## 2023-12-14 LAB — BASIC METABOLIC PANEL WITH GFR
Anion gap: 8 (ref 5–15)
BUN: 12 mg/dL (ref 8–23)
CO2: 22 mmol/L (ref 22–32)
Calcium: 9.3 mg/dL (ref 8.9–10.3)
Chloride: 108 mmol/L (ref 98–111)
Creatinine, Ser: 0.83 mg/dL (ref 0.44–1.00)
GFR, Estimated: >60 mL/min (ref >60)
Glucose, Bld: 106 mg/dL — ABNORMAL HIGH (ref 70–99)
Potassium: 4 mmol/L (ref 3.5–5.1)
Sodium: 138 mmol/L (ref 135–145)

## 2023-12-14 MED ORDER — GABAPENTIN 100 MG PO CAPS
100.0000 mg | ORAL_CAPSULE | Freq: Three times a day (TID) | ORAL | Status: DC
  Administered 2023-12-14 – 2023-12-19 (×17): 100 mg via ORAL
  Filled 2023-12-14 (×16): qty 1

## 2023-12-14 MED ORDER — SODIUM CHLORIDE 1 G PO TABS
1.0000 g | ORAL_TABLET | Freq: Three times a day (TID) | ORAL | Status: DC
  Administered 2023-12-14 – 2023-12-15 (×4): 1 g via ORAL
  Filled 2023-12-14 (×4): qty 1

## 2023-12-14 NOTE — Consult Note (Signed)
 Neuropsychological Consultation Comprehensive Inpatient Rehab   Patient:   Andrea Bridges   DOB:   1961/01/22  MR Number:  [id number]  Location:  [facility] [address] CENTER A [address] [place] [address] Dept: [phone] Loc: [phone]           Date of Service:   12/14/2023  Start Time:   3 PM End Time:   4 PM  Provider/Observer:  Norleen Asa, Psy.D.       Clinical Neuropsychologist       Billing Code/Service: [phone]  Reason for Service:    Andrea Bridges is a 63 year old right-handed female.  Referred for a neuropsychological consultation for cognitive changes and coping issues during inpatient rehabilitation.  History of Present Illness: Admitted to the comprehensive inpatient rehabilitation (CIR) unit following a cerebrovascular accident with subarachnoid hemorrhage. Presented on 11/20/2023 with headache and altered mental status while working outdoors. - Cranial CT on 11/20/2023 showed diffuse basal subarachnoid hemorrhage with intraventricular extension and early ventriculomegaly, greatest along the left cerebellopontine angle cistern. - Loaded with Keppra . EEG was suggestive of moderate to severe diffuse encephalopathy without seizure activity. - Underwent right frontal ventricular catheter placement on 11/20/2023 for early obstructive hydrocephalus. - Cerebral angiogram was aborted due to agitation and restlessness. - On 12/06/2023, experienced an episode of increased altered mental status with shaking, initially non-responsive and not following commands. - Repeat head CT was negative for any new acute process. Repeat EEG showed mild to moderate diffuse encephalopathy, again without seizure activity. - Admitted to CIR for functional deficits in mobility and cognition after medical stabilization.  Past Medical History: - Polycystic ovarian syndrome - Anxiety - Tobacco use - Obesity - No history of migraines,  only occasional minor headaches.  Current Medications: - Remeron  for sleep. - Anticonvulsants: Keppra , Gabapentin . - Topamax  for pain/headache.  Neuropsychological Consultation: This neuropsychologist met with Andrea Bridges and her daughter, Andrea Bridges, for an initial consultation. Andrea Bridges was alert and oriented to her location (rehab [place] She demonstrated a partial understanding of her recent medical events, describing it as something similar to a brain aneurysm that...burst or ruptured. She questioned the cause and whether it could have been prevented.  Extensive psychoeducation was provided to Andrea Bridges and her daughter regarding the nature of the subarachnoid hemorrhage. - Explained that the cause is often unknown, potentially related to a transient blood pressure spike or a congenital vessel weakness. - Explained the concept of encephalopathy, linking her initial confusion and current grogginess to the bleed, resultant intracranial pressure, and the sedative effects of her anticonvulsant medications (Keppra , Topamax , Gabapentin ). - Discussed that new symptoms may emerge as blood products shift but this is not necessarily a cause for alarm. - Emphasized that her current treatment and rehabilitation plan follows an established protocol for brain injury recovery. The goal is to maximize recovery, and progress is expected. - Informed that full recovery takes time, and symptoms are not considered permanent until at least one year post-injury. Her current projected discharge date is a target, subject to change based on progress toward safety goals for returning home. - Addressed her difficulty with focus for reading, normalizing this as a common consequence of her condition. - Encouraged her to ask questions and to report any significant increase in distress or feelings of being overwhelmed (hitting a wall), as this can be a side effect of medication or a sign of emotional distress  that can impede therapy participation. -  Discussed the transition of medical communication from her daughter back to her as she regains cognitive capacity and the right to medical privacy. Information on using MyChart was provided with a caution against misinterpreting medical notes or using online search engines for medical information.  Andrea Bridges appeared engaged and reported that the information was helpful and interesting to her. Her daughter noted significant improvement in the past week. Andrea Bridges acknowledged that while she doesn't feel different from two weeks ago, others have reported a significant change in her presentation.  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   Insulin  resistance 07/25/2023   Colon polyps 06/09/2023   Encounter for screening for malignant neoplasm of lung in current smoker with 20 pack year history or greater 09/08/2022   Vitamin B12 deficiency 09/08/2022   Vitamin D  deficiency 09/08/2022   Other specified anxiety disorders-Emotional Eating 09/08/2022   At risk for heart disease 09/08/2022   Generalized obesity 09/08/2022   Tobacco abuse 07/10/2019   Basal cell carcinoma of skin 02/07/2018    Behavioral Observation/Mental Status:   Andrea Bridges  presents as a 63 y.o.-year-old Right handed Caucasian Female who appeared her stated age. her dress was Appropriate and she was Well Groomed and her manners were  Appropriate to the situation.  her participation was indicative of Appropriate, Inattentive, and Redirectable behaviors.  There were physical disabilities noted.  she displayed an appropriate level of cooperation and motivation.    Interactions:    Active Appropriate  Attention:   abnormal and attention span appeared shorter than expected for age  Memory:   abnormal; remote memory intact, recent memory impaired  Visuo-spatial:   not examined  Speech (Volume):  low  Speech:   normal; some reduction in verbal fluency particularly with both lexical and semantic fluency noted.  Patient had 1 or 2 paraphasic errors noted during visit  Thought Process:  Coherent and Relevant  Coherent and Distracted  Though Content:  WNL; not suicidal and not homicidal  Orientation:   person, place, and situation  Judgment:   Fair  Planning:   Poor  Affect:    Flat and Lethargic  Mood:    Dysphoric  Insight:   Good  Intelligence:   normal  Psychiatric History:  Patient with past psychiatric diagnosis of an anxiety disorder without psychotropic medications prior to cerebrovascular event.  Family Med/Psych History:  Family History  Problem Relation Age of Onset   Thyroid  disease Mother    Anxiety disorder Mother    Obesity Mother    Cancer Father        skin   Sleep apnea Father    Impression/DX:  Keyla is a 63 year old female recovering from a subarachnoid hemorrhage, currently admitted for intensive inpatient rehabilitation. She was initially encephalopathic but is now alert, oriented, and demonstrating improved cognitive function, though still experiencing some grogginess and slowed thinking. She is actively engaging in her therapies. She  and her daughter appear to have a good understanding of the information provided and are hopeful about the recovery process.  Plan: 1.  Continue to monitor cognitive and emotional status throughout her inpatient rehabilitation stay. 2.  Provide supportive  psychotherapy and psychoeducation as needed to address coping with her neurological injury and prolonged hospitalization. 3.  Serve as a member of the interdisciplinary team, collaborating with her attending physiatrist (Dr. Emeline), PAs, and therapists. 4.  Follow-up with Ms. Slape as needed during her admission. 5.  Ensure she and her family are aware of the plan for follow-up appointments post-discharge.          Electronically Signed   _______________________ Norleen Asa, Psy.D. Clinical Neuropsychologist

## 2023-12-14 NOTE — Progress Notes (Signed)
 PROGRESS NOTE   Subjective/Complaints:  No events overnight.    Patient complaining of bilateral lower extremity numbness/burning pain wrapping around the lateral hips and extending into the lateral calves, unsure if it is on the feet.  States this was going on prior to her hospitalization, but has worsened since her surgery.  No significant history of lumbosacral radiculopathy or back interventions.   Labs this a.m. stable. Blood pressure remains soft after IV fluids yesterday, however with better orthostatic hypotension tolerance yesterday as below  Review of the reports from yesterday: She completed 200 ft of functional mobility to the therapy gym with her daughter providing w/c follow. She took a prolonged seated rest break and then completed another 200 ft of functional mobility holding two 6 lb dumbbells to grade up balance and functional activity tolerance demands.   Had to straight cath send to continent voids overnight, 1 small bowel movement.  Is getting urge to void, especially in the morning.  ROS:  Pt denies SOB, abd pain, CP, N/V/C/D, and vision changes + Headache--improving + Urinary retention--improving  Objective:   No results found. Recent Labs    12/12/23 0523  WBC 5.6  HGB 11.2*  HCT 33.7*  PLT 305   Recent Labs    12/12/23 0523 12/14/23 0503  NA 138 138  K 4.0 4.0  CL 109 108  CO2 21* 22  GLUCOSE 100* 106*  BUN 16 12  CREATININE 0.84 0.83  CALCIUM 9.3 9.3    Intake/Output Summary (Last 24 hours) at 12/14/2023 0817 Last data filed at 12/14/2023 0525 Gross per 24 hour  Intake 1218.16 ml  Output 1110 ml  Net 108.16 ml        Physical Exam: Vital Signs Blood pressure (!) 100/58, pulse 62, temperature 98.2 F (36.8 C), temperature source Oral, resp. rate 19, SpO2 100%.  General: awake, alert, relaxed, no acute distress.  Laying in bed.   HENT: conjugate gaze; oropharynx moist-bur hole  site open to air.  No obvious drainage.  staples and sutures on EVD site-intact. CV: regular rate and rhythm; no JVD Pulmonary: CTA B/L; no W/R/R- good air movement GI: soft, NT, ND, (+)BS Psychiatric: appropriate mood and affect  Neuro:   Mental Status: Alert and oriented x4,  Following commands.  + Memory deficits, concentration difficulty, poor insight --improving daily  Cranial nerves II through XII intact Moves all 4 extremities equally antigravity against resistance, 4 out of 5 throughout REFLEXES: DTR 2+ patella and Achilles bilaterally, no beats of clonus b/l ankles SENSORY: Normal to touch all 4 extremities   MSK: no joint swelling noted  No significant changes 8-6  Assessment/Plan: 1. Functional deficits which require 3+ hours per day of interdisciplinary therapy in a comprehensive inpatient rehab setting. Physiatrist is providing close team supervision and 24 hour management of active medical problems listed below. Physiatrist and rehab team continue to assess barriers to discharge/monitor patient progress toward functional and medical goals  Care Tool:  Bathing    Body parts bathed by patient: Right arm, Left arm, Chest, Abdomen, Front perineal area, Buttocks, Right upper leg, Left upper leg, Right lower leg, Left lower leg, Face  Bathing assist Assist Level: Contact Guard/Touching assist     Upper Body Dressing/Undressing Upper body dressing   What is the patient wearing?: Bra, Pull over shirt    Upper body assist Assist Level: Supervision/Verbal cueing    Lower Body Dressing/Undressing Lower body dressing      What is the patient wearing?: Underwear/pull up, Pants     Lower body assist Assist for lower body dressing: Contact Guard/Touching assist     Toileting Toileting    Toileting assist Assist for toileting: Contact Guard/Touching assist     Transfers Chair/bed transfer  Transfers assist     Chair/bed transfer assist level:  Moderate Assistance - Patient 50 - 74%     Locomotion Ambulation   Ambulation assist      Assist level: Moderate Assistance - Patient 50 - 74% Assistive device: Hand held assist Max distance: 72   Walk 10 feet activity   Assist     Assist level: Moderate Assistance - Patient - 50 - 74% Assistive device: Hand held assist   Walk 50 feet activity   Assist    Assist level: Moderate Assistance - Patient - 50 - 74% Assistive device: Hand held assist    Walk 150 feet activity   Assist Walk 150 feet activity did not occur: Safety/medical concerns         Walk 10 feet on uneven surface  activity   Assist Walk 10 feet on uneven surfaces activity did not occur: Safety/medical concerns         Wheelchair     Assist Is the patient using a wheelchair?: Yes Type of Wheelchair: Manual    Wheelchair assist level: Supervision/Verbal cueing Max wheelchair distance: 50    Wheelchair 50 feet with 2 turns activity    Assist        Assist Level: Supervision/Verbal cueing   Wheelchair 150 feet activity     Assist      Assist Level: Maximal Assistance - Patient 25 - 49%   Blood pressure (!) 100/58, pulse 62, temperature 98.2 F (36.8 C), temperature source Oral, resp. rate 19, SpO2 100%.  Medical Problem List and Plan: 1. Functional deficits secondary to nontraumatic subarachnoid hemorrhage with hydrocephalus status post EVD removed 7/27 and initial burr hole             -patient may shower, cover incision please             -ELOS/Goals: 10-14 days, PT/OT/SLP sup - 12/21/23            Con't CIR PT OT and SLP    8/5: Family can provide 4-6 hours assistance per day. SLP saying will need full supervision.  Progressing well with therapies.   2.  Antithrombotics: -DVT/anticoagulation:  Pharmaceutical: Lovenox              -antiplatelet therapy: N/A 3. Pain Management: Topamax  25 mg twice daily, Fioricet  as needed, oxycodone  5 mg every 4 hours as  needed pain  8/2- needed Fioricet  and tylenol  for HA's today- con't regimen  8/3- HA a little more today- 5-6/10 but she reported this yesterday- will add Lidoderm  patches for low back/buttocks pain 8pm to 8am- explained canot add steroids secondary to NSU issues  8-5: Increase Topamax  to 50 mg twice daily.  Scheduled Tylenol  650 mg 3 times daily, reduce Fioricet  to 1 tablet every 6 hours as needed--patient reporting significant improvement with headaches since Topamax  increase  4. Mood/Behavior/Sleep: Provide emotional support             -  antipsychotic agents: N/A 5. Neuropsych/cognition: This patient is not capable of making decisions on her own behalf. 6. Skin/Wound Care: Routine skin checks  8/2- although sutures are 3 weeks, will not remove until Ok'd by NSU due to concern about fluid drainage- is clear- not enough right now to test for CSF- d/w nursing as well as NICU nursing and NSU-also con't Staples from EVD site and monitor-  NSU will come see tomorrow during rounds  8/3- appears to be a CSF leak, base don  halo- NSU will see tomorrow- don't see note so far, going off nursing report. See below for more in depth  8-4: Neurosurgery, Dr. Lanis saw yesterday, advised no changes given no signs of infection or CSF headache.  Will come back by today.  8-5: Surgical site looks good, no further drainage reported--will monitor through the end of this week, can check back with neurosurgery about sutures removal if drainage remains absent  7. Fluids/Electrolytes/Nutrition: Routine in and outs with follow-up chemistries--stable  - 8-4: P.o. intakes poor, noted started on Remeron  over the weekend.  Will monitor for now.  8-5: Eating 50 to 100% of meals; significantly improved.  8.  ?  Seizure.  Keppra  500 mg twice daily.   - Keppra  was initiated 8-1 for possible seizure, negative on EEG.  Will need to continue medication until neurosurgical follow-up  9.  Fevers/E. coli UTI/urinary retention.   7-day course Ancef  completed.  Blood cultures/CSF no growth to date and placed on Bactrim  DS 1 tablet every 12 hours x 10 doses 12/08/2023 for possible UTI.  Currently on airborne and contact precautions pending COVID/RVP results. - 8/2- off precautions   Continue Flomax  0.8 mg daily, Urecholine  10 mg 3 times daily.   - Consider voiding trial in 2 to 3 days  8/2- will leave foley until Sunday/Monday- pt overwhelmed by the idea of in/out cath yet- will let her get used to CIR before removing- x1 day  8/3- foley removed- ordered to bladder scan q6 hours and cath if volmes >350cc- with lidocaine - went over with husband and pt- she's agreeable.   - 8-4: Requiring intermittent straight caths, bethanechol  stopped due to hypotension; continue monitoring today  8-5: Move Flomax  to nightly for hypotension, reduced to 0.4 mg  8/ 6: Had a few continent voids overnight, still requiring intermittent ISC.  Start timed toileting every 4-6 hours, with orders for double voiding on attempts.  Continue Flomax  at current dose due to ongoing hypotension.  10.  Incidental finding of 7 mm nodular density in the medial right lung base.  Follow-up outpatient.  11.  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  Sodium 131-133.  Serum osmolality 289 urine osmolality 740 urine sodium 80.  Continue salt tablets 2 g 3 times daily.  Avoid hypotonic fluids.  8/2- Na better 134--normalized 8-4  8 /6: Reduce salt tabs to 1 g 3 times daily; repeat BMP in 2 days.  12.  Vasospasm Prevention.  Nimodipine  60 mg every 4 hours. NSGY note indicates for 21 days (end date 8/3)  13.  Tobacco abuse.  NicoDerm patch.  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 Prior to this, husband reports was planning on  stopping vaping, so don't want to encourage that.  8-4: No apparent picking today.  14.  Overweight/Class I obesity.  Last BMI 29.80.   Dietary follow-up 15. Hypokalemia  8/2- K+ 3.4- will replete with 40 Meq x1.   Normalized 8-4  16.  Hypotension  8/2- will con't Nimodipine  however will add midodrine  2.5 mg TID as needed for soft BP- will also order TEDs and ACE wraps if needed.  8/3- BP still soft- like running 90's systolic- will increase midodrine  to 5 mg TID prn- for SBP <100- shouldn't need once Nimodipine  is finished, hopefully  8-5: Abdominal binder, teds, move Flomax  to nightly as above.  Ordered 1 L normal saline at 100 cc/h over the course of today for additional support.  Labs in AM.  8-6: Labs stable, BPs remain soft but less symptomatic orthostasis.  Reducing salt tabs as above, monitor     12/14/2023    4:55 AM 12/13/2023    7:35 PM 12/13/2023    2:17 PM  Vitals with BMI  Systolic 100 105 884  Diastolic 58 63 65  Pulse 62 70 81     17. Insomnia/restless at night  8/3- added Remeron  since bladder issues, cannot do Trazadone etc, and has decreased appetite.   8-4: Sleep improved last night.  Monitor.  18.  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  - Lumbar x-rays ordered for initial workup  - Start gabapentin  100 mg 3 times daily; stay low dose due to ongoing orthostasis  LOS: 5 days A FACE TO FACE EVALUATION WAS PERFORMED  Joesph JAYSON Likes 12/14/2023, 8:17 AM

## 2023-12-14 NOTE — Progress Notes (Signed)
 Occupational Therapy Session Note  Patient Details  Name: Andrea Bridges MRN: [id number] Date of Birth: 07-01-1960  Today's Date: 12/14/2023 OT Individual Time: 0702-0816 OT Individual Time Calculation (min): 74 min    Short Term Goals: Week 1:  OT Short Term Goal 1 (Week 1): STGs=LTGs due to patient's estimated length of stay.  Skilled Therapeutic Interventions/Progress Updates:  Pt greeted resting in bed for skilled OT session with focus on BADLs retraining, functional transfers/mobility, general conditioning, and cognitive retraining.   Pain: Pt with un-rated low back pain during therex, OT offering intermediate rest breaks and positioning suggestions throughout session to address pain/fatigue and maximize participation/safety in session.   Functional Transfers: Bed mobility with supervision. Sit<>stands and stand-steps with CGA + no AD. Ambulation from day room>patient room in similar fashion, min cuing for step length and upright gaze.   Self Care Tasks: Pt completes the following self care tasks with levels of assistance noted below, UB: Bathing/dressing with setup/supervision, direct verbal cuing for removable of top before attempting to bathe chest/trunk.  LB: CGA for standing pericare/hike. Use of figure-4 technique for functional reach/threading.   Therapeutic Exercise/Activities: Pt completes x10 of Nustep modality on level 4 resistance for general conditioning. While engaging in physical activity, OT reviews series of daily problem-solving questions for dual-task/selective attention challenge. Pt requires Min A for safe problem-solving, question of decreased safety awareness vs personal judgement.   Pt remained resting in bed, positioned into chair,  with 4Ps assessed and immediate needs met. Pt continues to be appropriate for skilled OT intervention to promote further functional independence in ADLs/IADLs.   Therapy Documentation Precautions:  Precautions Precautions:  Fall Recall of Precautions/Restrictions: Impaired Precaution/Restrictions Comments: SBP <160 Restrictions Weight Bearing Restrictions Per Provider Order: No   Therapy/Group: Individual Therapy  [name], OTR/L, MSOT  12/14/2023, 5:18 AM

## 2023-12-14 NOTE — Progress Notes (Signed)
 Speech Language Pathology Daily Session Note  Patient Details  Name: Andrea Bridges MRN: [id number] Date of Birth: [birth year]  Today's Date: 12/14/2023 SLP Individual Time: 1330-1430 SLP Individual Time Calculation (min): 60 min  Short Term Goals: Week 1: SLP Short Term Goal 1 (Week 1): STG = LTG due to ELOS  Skilled Therapeutic Interventions:   Pt and her daughter greeted at bedside. She was awake upon SLP arrival after completing noon meal. She remains flat, but was very pleasant and cooperative throughout tx tasks targeting cognition. Noted to make a joke w/ her daughter this tx session as well. SLP facilitated orientation review and recall of recent events. She independently initiated using her schedule to assist w/ details (I.e. therapy times and names of staff). She benefited from minA to recall specific events of her prev therapy sessions. She then completed a money management task via menu. She continues to require an extensive amount of processing time during structured tasks; anticipate sustained and selective attention deficits negatively impacted success as well. She required modA to ID and correct errors. Of note, calculations [facility] and errors were d/t deficits w/ organization and attention to details. At the end of tx tasks, she was left in bed w/ her daughter present and call light within reach. Recommend cont ST per POC.   Pain Pain Assessment Pain Scale: 0-10 Pain Score: 8  Pain Location: Head Nursing provided pain medication during tx session. See EMR for more info.   Therapy/Group: Individual Therapy  [name] 12/14/2023, 2:17 PM

## 2023-12-14 NOTE — Progress Notes (Signed)
 Patient refusing 6pm cath. She states her bladder doesn't fill full yet. Bladder scanned and got . Will pass on to night shift.  Joey Lierman KATHEE Molt

## 2023-12-14 NOTE — Progress Notes (Signed)
 Physical Therapy Session Note  Patient Details  Name: Andrea Bridges MRN: [id number] Date of Birth: August 26, 1960  Today's Date: 12/14/2023 PT Individual Time: 9045-8894 PT Individual Time Calculation (min): 71 min   Short Term Goals: Week 1:  PT Short Term Goal 1 (Week 1): STGs = LTGs  Skilled Therapeutic Interventions/Progress Updates:    Pt presents in room in bed, agreeable to PT. Pt denies pain. Session focused on therapeutic activities to facilitate participation with self care tasks as well as vitals management and NMR for BUE/BLE coordination, dual tasking, and dynamic standing balance. Vitals monitored throughout session and noted below. Pt completes bed mobility with supervision, completes transfers with supervision no device throughout session. Pt ambulates to/from bathroom with supervision, slow gait speed. Pt able to manage 3/3 toileting tasks with supervision, continent of bowel only, charted. Pt completes hand hygiene with supervision demonstrating good sequencing for task. Pt completes seated rest break, then ambulates with close supervision to day room without device ~130'. Pt then completes NMR for BUE/BLE coordination, dynamic standing balance, and dual tasking including: - sitting self ball toss two hand toss and catch x20, self ball toss between hands x20 - standing self ball toss two hand toss and catch x20, self ball toss between hands x20 - marching self ball toss two hand toss and catch x20, self ball toss between hands x20 - forward/backward 3x10' self ball toss two hand toss and catch, self ball toss between hands - walking 175' self ball toss two hand toss and catch, self ball toss between hands - navigating x4 cones obstacle course forward x2 trials and forward/backward x1 trial self ball toss two hand toss and catch Pt impulsively takes supine rest break during exercises one instance, denies symptoms. Pt returns to room ambulating with supervision and remains semi reclined in  bed with all needs within reach, cal light in place and chair alarm donned and activated at end of session.  Vitals: - sitting EOB prior to activity TED hose donned: BP 95/50 (63), HR 81 - sitting following toilet transfer abdominal binder and TED hose donned: BP 90/65, HR 82 - sitting following activity TED hose and abdominal binder donned: BP 106/66, HR 88 - supine BP 98/65 (75), HR 72  Therapy Documentation Precautions:  Precautions Precautions: Fall Recall of Precautions/Restrictions: Impaired Precaution/Restrictions Comments: SBP <160 Restrictions Weight Bearing Restrictions Per Provider Order: No   Therapy/Group: Individual Therapy  Reche Ohara PT, DPT 12/14/2023, 1:00 PM

## 2023-12-15 ENCOUNTER — Inpatient Hospital Stay (HOSPITAL_COMMUNITY)

## 2023-12-15 LAB — URINALYSIS, W/ REFLEX TO CULTURE (INFECTION SUSPECTED)
Bacteria, UA: NONE SEEN
Bilirubin Urine: NEGATIVE
Glucose, UA: NEGATIVE mg/dL
Hgb urine dipstick: NEGATIVE
Ketones, ur: NEGATIVE mg/dL
Nitrite: NEGATIVE
Protein, ur: NEGATIVE mg/dL
Specific Gravity, Urine: 1.012 (ref 1.005–1.030)
pH: 5 (ref 5.0–8.0)

## 2023-12-15 MED ORDER — MIRTAZAPINE 15 MG PO TABS: 7.5000 mg | ORAL_TABLET | Freq: Every day | ORAL | Status: DC

## 2023-12-15 MED ORDER — BETHANECHOL CHLORIDE 10 MG PO TABS
10.0000 mg | ORAL_TABLET | Freq: Three times a day (TID) | ORAL | Status: DC
  Administered 2023-12-15 – 2023-12-20 (×19): 10 mg via ORAL
  Filled 2023-12-15 (×15): qty 1

## 2023-12-15 MED ORDER — TAMSULOSIN HCL 0.4 MG PO CAPS
0.8000 mg | ORAL_CAPSULE | Freq: Every day | ORAL | Status: DC
  Administered 2023-12-15 – 2023-12-20 (×8): 0.8 mg via ORAL
  Filled 2023-12-15 (×6): qty 2

## 2023-12-15 MED ORDER — SODIUM CHLORIDE 1 G PO TABS
1.0000 g | ORAL_TABLET | Freq: Two times a day (BID) | ORAL | Status: DC
  Administered 2023-12-16 – 2023-12-19 (×8): 1 g via ORAL
  Filled 2023-12-15 (×7): qty 1

## 2023-12-15 MED ORDER — TRAZODONE HCL 50 MG PO TABS
50.0000 mg | ORAL_TABLET | Freq: Every evening | ORAL | Status: DC | PRN
  Administered 2023-12-18 – 2023-12-20 (×6): 50 mg via ORAL
  Filled 2023-12-15 (×5): qty 1

## 2023-12-15 MED ORDER — MELATONIN 5 MG PO TABS
5.0000 mg | ORAL_TABLET | Freq: Every evening | ORAL | Status: DC | PRN
  Administered 2023-12-15 – 2023-12-18 (×4): 5 mg via ORAL
  Filled 2023-12-15 (×4): qty 1

## 2023-12-15 MED ORDER — MIDODRINE HCL 5 MG PO TABS
5.0000 mg | ORAL_TABLET | Freq: Two times a day (BID) | ORAL | Status: DC
  Administered 2023-12-15 – 2023-12-18 (×6): 5 mg via ORAL
  Filled 2023-12-15 (×6): qty 1

## 2023-12-15 NOTE — Progress Notes (Signed)
 Speech Language Pathology Daily Session Note  Patient Details  Name: Geneive Sandstrom MRN: [id number] Date of Birth: 24-Apr-1961  Today's Date: 12/15/2023 SLP Individual Time: 1307-1406 SLP Individual Time Calculation (min): 59 min  Short Term Goals: Week 1: SLP Short Term Goal 1 (Week 1): STG = LTG due to ELOS  Skilled Therapeutic Interventions: Skilled therapy session focused on cognitive goals. SLP facilitated session by prompting patient to share todays date, d/c date, and admission date. Patient did so utilizing calendar with supervisionA. SLP targeted patients attention, organization and problem solving skills through abstract word puzzle. Patient required minA to sustain attention to task and complete puzzle accurately. SLP continued to target cognitive goals through word problems related to time. Patient required min occasional mod A to complete. Patient independently completed memory book at the end of the session. Patient left in bed with alarm set and call bell in reach. Continue POC.  Pain None reported   Therapy/Group: Individual Therapy  Caasi Giglia M.A., CCC-SLP 12/15/2023, 7:50 AM

## 2023-12-15 NOTE — Progress Notes (Signed)
 Physical Therapy Session Note  Patient Details  Name: Andrea Bridges MRN: [id number] Date of Birth: [birth year]  Today's Date: 12/15/2023 PT Individual Time: 0920-0945 PT Individual Time Calculation (min): 25 min   Short Term Goals: Week 1:  PT Short Term Goal 1 (Week 1): STGs = LTGs  Skilled Therapeutic Interventions/Progress Updates:    Pt presents in room in bed, agreeable to PT. Pt denies pain throughout session. Session focused on therapeutic activities for facilitating participation with self care tasks, vitals management, and activity tolerance. Therapist dons thigh high TED hose total assist for vitals management, dons abdominal binder with pt in sitting max assist. Pt completes bed mobility with supervision bordering on modI. Pt dons shirt, pants, and shoes with supervision. Pt completes transfers throughout session with supervision. Pt ambulates without device with supervision to day room and comes to sitting on nustep. Pt completes continuous training x10 min on nustep L6 BUE/BLE for activity tolerance and global endurance with self selected speed 25-30 SPM, cues to increase speed to 40 SPM for task, for last minute pt cued to increase speed to as fast as you can with pt able to increase speed to 55-60. Pt handoff to PT at end of session seated EOM in day room.  Therapy Documentation Precautions:  Precautions Precautions: Fall Recall of Precautions/Restrictions: Impaired Precaution/Restrictions Comments: SBP <160 Restrictions Weight Bearing Restrictions Per Provider Order: No    Therapy/Group: Individual Therapy  Reche Ohara PT, DPT 12/15/2023, 9:50 AM

## 2023-12-15 NOTE — Progress Notes (Signed)
 Occupational Therapy Session Note  Patient Details  Name: Andrea Bridges MRN: [id number] Date of Birth: 05-01-1961  Today's Date: 12/15/2023 OT Individual Time: 1445-1535 OT Individual Time Calculation (min): 50 min  and Today's Date: 12/15/2023 OT Missed Time: 10 Minutes Missed Time Reason: Nursing care   Short Term Goals: Week 1:  OT Short Term Goal 1 (Week 1): STGs=LTGs due to patient's estimated length of stay.  Skilled Therapeutic Interventions/Progress Updates:  Pt greeted resting in bed, awaiting cathing, missing ~15 mins of skilled intervention. Upon OT return, skilled OT session with focus on BADL retraining and cognitive retraining.   Pain: Pt with no reports of pain, OT offering intermediate rest breaks and positioning suggestions throughout session to address pain/fatigue and maximize participation/safety in session.   Functional Transfers: All transfers with supervision + no AD.   Self Care Tasks: Pt completes full-body bathing/dressing within shower context. Pt requires supervision, cuing provided for seated LB care due to [facility]. Intermediate use of grab bar for standing care in shower.   Therapeutic Activities: Pt instructed in sequencing/organization task with 90% accuracy within reasonable amount of time.   Pt remained resting in bed, daughter present, 4Ps assessed and immediate needs met. Pt continues to be appropriate for skilled OT intervention to promote further functional independence in ADLs/IADLs.   Therapy Documentation Precautions:  Precautions Precautions: Fall Recall of Precautions/Restrictions: Impaired Precaution/Restrictions Comments: SBP <160 Restrictions Weight Bearing Restrictions Per Provider Order: No   Therapy/Group: Individual Therapy  [name], OTR/L, MSOT  12/15/2023, 6:08 AM

## 2023-12-15 NOTE — Progress Notes (Signed)
 Physical Therapy Session Note  Patient Details  Name: Andrea Bridges MRN: [id number] Date of Birth: 1960-11-08  Today's Date: 12/15/2023 PT Individual Time: 0950-1031 PT Individual Time Calculation (min): 41 min   Short Term Goals: Week 1:  PT Short Term Goal 1 (Week 1): STGs = LTGs  Skilled Therapeutic Interventions/Progress Updates:     Pt received seated on mat table in dayroom working with other PT. Pt handed off to this PT and agrees tot herapy. Reports some pain in LLE, referring to it as sciatic pain. PT provides rest breaks as needed to manage pain. Pt performs sit to stand with cues for intiation and ambulates x175' with CGA/minA and cues for trunk rotation and arm swing to improve balance. PT explains 6 minute walk test to pt and she is agreeable to perform. Pt completes test with score of 900', ambulating without AD and with similar cues, as well as cues to maintain upright gaze to improve posture and balance. Pt rates modified borg RPE at 7/10 following ambulation. Pt transitions to supine with cues for positioning. Pt completes soft tissue lengthening for lower extremities due to PT noting tightness and pain in LLE and low back. PT provides PROM stretch of hamstrings, providing prolonged hold at end ranger for ~3:00 on alternating legs. Pt then performs 3x1:00 bilateral knee hold into chest to stretch low back and glute muscle groups. Pt performs 3x1:00 hold of alternating legs in hip flexion and adduction for sciatic nerve flossing and glute stretch. Pt then performs 3x10 bridges in hooklying with cues for NM feedback and correct performance. Supine to sit with cues for positioning and sequencing. Pt ambulates back to room with cues for navigation. Pt left supine in bed with all needs within reach.  Therapy Documentation Precautions:  Precautions Precautions: Fall Recall of Precautions/Restrictions: Impaired Precaution/Restrictions Comments: SBP <160 Restrictions Weight Bearing  Restrictions Per Provider Order: No    Therapy/Group: Individual Therapy  Elsie JAYSON Dawn, PT, DPT 12/15/2023, 4:49 PM

## 2023-12-15 NOTE — Progress Notes (Addendum)
 PROGRESS NOTE   Subjective/Complaints:  No events overnight.  This morning on evaluation, patient complains of sensation of bladder fullness and needing to urinate. Scanned and cathed 2 hours prior; scanned for 850 ccs.  Continues with intermittent straight caths for most voids, can void somewhat and has urge but cannot empty sufficiently.  Blood pressure remains soft.   Medium bowel movement yesterday, small bowel movement this a.m. ROS:  Pt denies SOB, abd pain, CP, N/V/C/D, and vision changes + Headache--ongoing, intermittent + Urinary retention--ongoing  Objective:   DG Lumbar Spine 2-3 Views Result Date: 12/14/2023 CLINICAL DATA:  Lumbar radicular pain. EXAM: LUMBAR SPINE - 2-3 VIEW COMPARISON:  None Available. FINDINGS: Five non-rib-bearing lumbar vertebra. Slight straightening of normal lordosis. Trace retrolisthesis at L2-L3. Vertebral body heights are normal. No fracture or compression deformity. Disc space narrowing and spurring at L2-L3, lesser at L4-L5. facet hypertrophy at L3-L4, L4-L5 and L5-S1. No evidence of pars defects or focal bone abnormality. Sacroiliac joints are congruent. IMPRESSION: 1. Mild degenerative disc disease at L2-L3, lesser at L4-L5. 2. Facet hypertrophy at L3-L4, L4-L5 and L5-S1. Electronically Signed   By: Andrea Gasman M.D.   On: 12/14/2023 15:00   No results for input(s): WBC, HGB, HCT, PLT in the last 72 hours.  Recent Labs    12/14/23 0503  NA 138  K 4.0  CL 108  CO2 22  GLUCOSE 106*  BUN 12  CREATININE 0.83  CALCIUM 9.3    Intake/Output Summary (Last 24 hours) at 12/15/2023 0944 Last data filed at 12/15/2023 0435 Gross per 24 hour  Intake 600 ml  Output 2836 ml  Net -2236 ml        Physical Exam: Vital Signs Blood pressure 97/66, pulse 65, temperature 98 F (36.7 C), temperature source Oral, resp. rate 18, SpO2 100%.  General: awake, alert, relaxed, no acute  distress.  Laying in bed.   HENT: conjugate gaze; oropharynx moist  bur hole site open to air.  No obvious drainage.  staples and sutures on EVD site-intact.  CV: regular rate and rhythm; no JVD Pulmonary: CTA B/L; no W/R/R- good air movement GI: soft, NT, ND, (+)BS. + Lower abdominal tenderness with palpation. Psychiatric: appropriate mood and affect  Neuro:   Mental Status: Alert and oriented x4,  Following commands.  + Memory deficits, concentration difficulty, poor insight --improving daily Moves all 4 extremities equally antigravity against resistance, 4 out of 5 throughout  Prior exams: Cranial nerves II through XII intact  REFLEXES: DTR 2+ patella and Achilles bilaterally, no beats of clonus b/l ankles SENSORY: Normal to touch all 4 extremities   MSK: no joint swelling noted   Assessment/Plan: 1. Functional deficits which require 3+ hours per day of interdisciplinary therapy in a comprehensive inpatient rehab setting. Physiatrist is providing close team supervision and 24 hour management of active medical problems listed below. Physiatrist and rehab team continue to assess barriers to discharge/monitor patient progress toward functional and medical goals  Care Tool:  Bathing    Body parts bathed by patient: Right arm, Left arm, Chest, Abdomen, Front perineal area, Buttocks, Right upper leg, Left upper leg, Right lower leg, Left lower leg, Face  Bathing assist Assist Level: Contact Guard/Touching assist     Upper Body Dressing/Undressing Upper body dressing   What is the patient wearing?: Bra, Pull over shirt    Upper body assist Assist Level: Supervision/Verbal cueing    Lower Body Dressing/Undressing Lower body dressing      What is the patient wearing?: Underwear/pull up, Pants     Lower body assist Assist for lower body dressing: Contact Guard/Touching assist     Toileting Toileting    Toileting assist Assist for toileting: Contact  Guard/Touching assist     Transfers Chair/bed transfer  Transfers assist     Chair/bed transfer assist level: Moderate Assistance - Patient 50 - 74%     Locomotion Ambulation   Ambulation assist      Assist level: Moderate Assistance - Patient 50 - 74% Assistive device: Hand held assist Max distance: 72   Walk 10 feet activity   Assist     Assist level: Moderate Assistance - Patient - 50 - 74% Assistive device: Hand held assist   Walk 50 feet activity   Assist    Assist level: Moderate Assistance - Patient - 50 - 74% Assistive device: Hand held assist    Walk 150 feet activity   Assist Walk 150 feet activity did not occur: Safety/medical concerns         Walk 10 feet on uneven surface  activity   Assist Walk 10 feet on uneven surfaces activity did not occur: Safety/medical concerns         Wheelchair     Assist Is the patient using a wheelchair?: Yes Type of Wheelchair: Manual    Wheelchair assist level: Supervision/Verbal cueing Max wheelchair distance: 50    Wheelchair 50 feet with 2 turns activity    Assist        Assist Level: Supervision/Verbal cueing   Wheelchair 150 feet activity     Assist      Assist Level: Maximal Assistance - Patient 25 - 49%   Blood pressure 97/66, pulse 65, temperature 98 F (36.7 C), temperature source Oral, resp. rate 18, SpO2 100%.  Medical Problem List and Plan: 1. Functional deficits secondary to nontraumatic subarachnoid hemorrhage with hydrocephalus status post EVD removed 7/27 and initial burr hole             -patient may shower, cover incision please             -ELOS/Goals: 10-14 days, PT/OT/SLP sup - 12/21/23            Con't CIR PT OT and SLP    8/5: Family can provide 4-6 hours assistance per day. SLP saying will need full supervision.  Progressing well with therapies.   2.  Antithrombotics: -DVT/anticoagulation:  Pharmaceutical: Lovenox              -antiplatelet  therapy: N/A  3. Pain Management: Topamax  25 mg twice daily, Fioricet  as needed, oxycodone  5 mg every 4 hours as needed pain  8/2- needed Fioricet  and tylenol  for HA's today- con't regimen  8/3- HA a little more today- 5-6/10 but she reported this yesterday- will add Lidoderm  patches for low back/buttocks pain 8pm to 8am- explained canot add steroids secondary to NSU issues  8-5: Increase Topamax  to 50 mg twice daily.  Scheduled Tylenol  650 mg 3 times daily, reduce Fioricet  to 1 tablet every 6 hours as needed--patient reporting significant improvement with headaches since Topamax  increase  8-7: DC Topamax , possible contributor to ongoing urinary retention, headaches improving.  Continue with gabapentin  4.  4. Mood/Behavior/Sleep: Provide emotional support             -antipsychotic agents: N/A 5. Neuropsych/cognition: This patient is not capable of making decisions on her own behalf. 6. Skin/Wound Care: Routine skin checks  8/2- although sutures are 3 weeks, will not remove until Ok'd by NSU due to concern about fluid drainage- is clear- not enough right now to test for CSF- d/w nursing as well as NICU nursing and NSU-also con't Staples from EVD site and monitor-  NSU will come see tomorrow during rounds  8/3- appears to be a CSF leak, base don  halo- NSU will see tomorrow- don't see note so far, going off nursing report. See below for more in depth  8-4: Neurosurgery, Dr. Lanis saw yesterday, advised no changes given no signs of infection or CSF headache.  Will come back by today.  8-5: Surgical site looks good, no further drainage reported--will monitor through the end of this week, can check back with neurosurgery about sutures removal if drainage remains absent  7. Fluids/Electrolytes/Nutrition: Routine in and outs with follow-up chemistries--stable  - 8-4: P.o. intakes poor, noted started on Remeron  over the weekend.  Will monitor for now.  8-5: Eating 50 to 100% of meals; significantly  improved.  8.  ?  Seizure.  Keppra  500 mg twice daily.   - Keppra  was initiated 8-1 for possible seizure, negative on EEG.  Will need to continue medication until neurosurgical follow-up  9.  Fevers/E. coli UTI/urinary retention/neurogenic bladder.  7-day course Ancef  completed.  Blood cultures/CSF no growth to date and placed on Bactrim  DS 1 tablet every 12 hours x 10 doses 12/08/2023 for possible UTI.  Currently on airborne and contact precautions pending COVID/RVP results. - 8/2- off precautions   Continue Flomax  0.8 mg daily, Urecholine  10 mg 3 times daily.   - Consider voiding trial in 2 to 3 days  8/2- will leave foley until Sunday/Monday- pt overwhelmed by the idea of in/out cath yet- will let her get used to CIR before removing- x1 day  8/3- foley removed- ordered to bladder scan q6 hours and cath if volmes >350cc- with lidocaine - went over with husband and pt- she's agreeable.   - 8-4: Requiring intermittent straight caths, bethanechol  stopped due to hypotension; continue monitoring today  8-5: Move Flomax  to nightly for hypotension, reduced to 0.4 mg  8/ 6: Had a few continent voids overnight, still requiring intermittent ISC.  Start timed toileting every 4-6 hours, with orders for double voiding on attempts.  Continue Flomax  at current dose due to ongoing hypotension.  8-7: Add baclofen 10 mg 3 times daily, increase Flomax  to 0.8 mg nightly.  Adding a.m. midodrine  to balance out.  DC Topamax , mirtazapine .  Getting renal ultrasound today for further workup.  Urinalysis negative.  Is having regular, daily bowel movements, so doubt contribution of constipation.  10.  Incidental finding of 7 mm nodular density in the medial right lung base.  Follow-up outpatient.  11.  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  Sodium 131-133.  Serum osmolality 289 urine osmolality 740 urine sodium 80.  Continue salt tablets 2 g 3 times daily.  Avoid hypotonic fluids.  8/2- Na better 134--normalized 8-4  8 /6: Reduce  salt tabs to 1 g 3 times daily   8-7: BMP stable, reduce salt tabs 2 times daily.  12.  Vasospasm Prevention.  Nimodipine  60 mg every 4 hours. NSGY note indicates for 21 days (end date 8/3)  13.  Tobacco abuse.  NicoDerm patch.  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 Prior to this, husband reports was planning on  stopping vaping, so don't want to encourage that.  8-4: No apparent picking today.  14.  Overweight/Class I obesity.  Last BMI 29.80.  Dietary follow-up 15. Hypokalemia  8/2- K+ 3.4- will replete with 40 Meq x1.   Normalized 8-4  16.  Hypotension  8/2- will con't Nimodipine  however will add midodrine  2.5 mg TID as needed for soft BP- will also order TEDs and ACE wraps if needed.  8/3- BP still soft- like running 90's systolic- will increase midodrine  to 5 mg TID prn- for SBP <100- shouldn't need once Nimodipine  is finished, hopefully  8-5: Abdominal binder, teds, move Flomax  to nightly as above.  Ordered 1 L normal saline at 100 cc/h over the course of today for additional support.  Labs in AM.  8-6: Labs stable, BPs remain soft but less symptomatic orthostasis.  Reducing salt tabs as above, monitor  8-7: DC mirtazapine , Topamax .  Adding midodrine  5 mg twice daily while adjusting bladder regimen as below.  AM orthostats ordered.     12/15/2023    4:35 AM 12/14/2023    9:05 PM 12/14/2023    7:55 PM  Vitals with BMI  Systolic 97 102 95  Diastolic 66 56 51  Pulse 65  80     17. Insomnia/restless at night  8/3- added Remeron  since bladder issues, cannot do Trazadone etc, and has decreased appetite.   8-4: Sleep improved last night.  Monitor.  8-7: DC Remeron  due to urinary retention.  Add as needed melatonin 5 mg, trazodone  50 mg second line  18.  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  - Lumbar x-rays ordered for initial  workup IMPRESSION: 1. Mild degenerative disc disease at L2-L3, lesser at L4-L5. 2. Facet hypertrophy at L3-L4, L4-L5 and L5-S1.   - Start gabapentin  100 mg 3 times daily; stay low dose due to ongoing orthostasis  - 8-7: Symptoms likely secondary to lumbosacral radiculopathy versus sciatica given significant lumbar arthritis as above; recommend EMG as outpatient  LOS: 6 days A FACE TO FACE EVALUATION WAS PERFORMED  Joesph JAYSON Likes 12/15/2023, 9:44 AM

## 2023-12-15 NOTE — Progress Notes (Signed)
 Speech Language Pathology Daily Session Note  Patient Details  Name: Andrea Bridges MRN: [id number] Date of Birth: 1960-09-05  Today's Date: 12/15/2023 SLP Individual Time: 9193-9154 SLP Individual Time Calculation (min): 39 min and Today's Date: 12/15/2023 SLP Missed Time: 9 Minutes Missed Time Reason: Other (Comment)  Short Term Goals: Week 1: SLP Short Term Goal 1 (Week 1): STG = LTG due to ELOS  Skilled Therapeutic Interventions:   Pt and ex husband greeted at bedside. She was awake/alert upon SLP arrival and agreeable to tx tasks targeting cognition. She was independently oriented w/in one day of current date and benefited from minA to recall details from Neuropsych visit yesterday afternoon. SLP facilitated written organization task and pt benefited from s cues for attention to detail and information processing. Continues to require some additional processing time, however, improved as compared to prev tx session. Additionally, she was able to sustain attention for ~15 mins independently. She filled out her memory notebook for the morning and was encouraged to maintain this throughout the day. She was left in her bed w/ the alarm set and call light within reach. Recommend cont ST per POC.   Pain Pain Assessment Pain Scale: 0-10 Pain Score: 3  Pain Location: Head  Therapy/Group: Individual Therapy  [name] 12/15/2023, 8:24 AM

## 2023-12-16 LAB — BASIC METABOLIC PANEL WITH GFR
Anion gap: 11 (ref 5–15)
BUN: 16 mg/dL (ref 8–23)
CO2: 23 mmol/L (ref 22–32)
Calcium: 9.3 mg/dL (ref 8.9–10.3)
Chloride: 108 mmol/L (ref 98–111)
Creatinine, Ser: 0.75 mg/dL (ref 0.44–1.00)
GFR, Estimated: >60 mL/min (ref >60)
Glucose, Bld: 98 mg/dL (ref 70–99)
Potassium: 4.1 mmol/L (ref 3.5–5.1)
Sodium: 142 mmol/L (ref 135–145)

## 2023-12-16 NOTE — Progress Notes (Signed)
 PROGRESS NOTE   Subjective/Complaints:  Intermittent straight cath once overnight, looks like from the log that she had a continent urination this morning but patient denies this.  She does continue to have urgency, but difficulty emptying.  Discussed if no improvement in the next 24 hours, would recommend replacement of Foley, which she is in agreement with.  Vitals stable; BP holding up HA 5/10 overnight; remains well-controlled with as needed medications   ROS:  Pt denies SOB, abd pain, CP, N/V/C/D, and vision changes + Headache--ongoing, intermittent + Urinary retention--ongoing  Objective:   US  RENAL Result Date: 12/15/2023 CLINICAL DATA:  Urinary retention EXAM: RENAL / URINARY TRACT ULTRASOUND COMPLETE COMPARISON:  None Available. FINDINGS: Right Kidney: Renal measurements: 10.0 x 4.6 x 5.0 cm = volume: 121.1 mL. Echogenicity within normal limits. No mass or hydronephrosis visualized. Left Kidney: Renal measurements: 13.0 x 5.6 x 4.4 cm = volume: 168.9 mL. Echogenicity within normal limits. No mass or hydronephrosis visualized. Bladder: Appears normal for degree of bladder distention. Other: None. IMPRESSION: 1. Unremarkable renal ultrasound. Electronically Signed   By: Ozell Daring M.D.   On: 12/15/2023 22:04   DG Lumbar Spine 2-3 Views Result Date: 12/14/2023 CLINICAL DATA:  Lumbar radicular pain. EXAM: LUMBAR SPINE - 2-3 VIEW COMPARISON:  None Available. FINDINGS: Five non-rib-bearing lumbar vertebra. Slight straightening of normal lordosis. Trace retrolisthesis at L2-L3. Vertebral body heights are normal. No fracture or compression deformity. Disc space narrowing and spurring at L2-L3, lesser at L4-L5. facet hypertrophy at L3-L4, L4-L5 and L5-S1. No evidence of pars defects or focal bone abnormality. Sacroiliac joints are congruent. IMPRESSION: 1. Mild degenerative disc disease at L2-L3, lesser at L4-L5. 2. Facet hypertrophy  at L3-L4, L4-L5 and L5-S1. Electronically Signed   By: Andrea Gasman M.D.   On: 12/14/2023 15:00   No results for input(s): WBC, HGB, HCT, PLT in the last 72 hours.  Recent Labs    12/14/23 0503 12/16/23 0545  NA 138 142  K 4.0 4.1  CL 108 108  CO2 22 23  GLUCOSE 106* 98  BUN 12 16  CREATININE 0.83 0.75  CALCIUM 9.3 9.3    Intake/Output Summary (Last 24 hours) at 12/16/2023 1002 Last data filed at 12/16/2023 0830 Gross per 24 hour  Intake 598 ml  Output 2300 ml  Net -1702 ml        Physical Exam: Vital Signs Blood pressure 101/69, pulse 71, temperature 98.1 F (36.7 C), resp. rate 16, SpO2 97%.  General: awake, alert, relaxed, no acute distress.  Laying in bed.   HENT: conjugate gaze; oropharynx moist  bur hole site open to air.  No obvious drainage.  staples and sutures, Steri-Strips on EVD site-intact.  No apparent drainage over site.  No fluctuance on palpation.  CV: regular rate and rhythm; no JVD Pulmonary: CTA B/L; no W/R/R- good air movement GI: soft, NT, ND, (+)BS. + Lower abdominal tenderness with palpation. Psychiatric: appropriate mood and affect  Neuro:   Mental Status: Alert and oriented x4,  Following commands.  + Memory deficits, concentration difficulty, poor insight --improving daily Moves all 4 extremities equally antigravity against resistance, 5- out of 5 throughout Cranial nerves II  through XII intact REFLEXES: DTR 2+ patella and Achilles bilaterally, no beats of clonus b/l ankles SENSORY: Normal to touch all 4 extremities   MSK: no joint swelling noted   Assessment/Plan: 1. Functional deficits which require 3+ hours per day of interdisciplinary therapy in a comprehensive inpatient rehab setting. Physiatrist is providing close team supervision and 24 hour management of active medical problems listed below. Physiatrist and rehab team continue to assess barriers to discharge/monitor patient progress toward functional and medical  goals  Care Tool:  Bathing    Body parts bathed by patient: Right arm, Left arm, Chest, Abdomen, Front perineal area, Buttocks, Right upper leg, Left upper leg, Right lower leg, Left lower leg, Face         Bathing assist Assist Level: Contact Guard/Touching assist     Upper Body Dressing/Undressing Upper body dressing   What is the patient wearing?: Bra, Pull over shirt    Upper body assist Assist Level: Supervision/Verbal cueing    Lower Body Dressing/Undressing Lower body dressing      What is the patient wearing?: Underwear/pull up, Pants     Lower body assist Assist for lower body dressing: Contact Guard/Touching assist     Toileting Toileting    Toileting assist Assist for toileting: Contact Guard/Touching assist     Transfers Chair/bed transfer  Transfers assist     Chair/bed transfer assist level: Moderate Assistance - Patient 50 - 74%     Locomotion Ambulation   Ambulation assist      Assist level: Moderate Assistance - Patient 50 - 74% Assistive device: Hand held assist Max distance: 72   Walk 10 feet activity   Assist     Assist level: Moderate Assistance - Patient - 50 - 74% Assistive device: Hand held assist   Walk 50 feet activity   Assist    Assist level: Moderate Assistance - Patient - 50 - 74% Assistive device: Hand held assist    Walk 150 feet activity   Assist Walk 150 feet activity did not occur: Safety/medical concerns         Walk 10 feet on uneven surface  activity   Assist Walk 10 feet on uneven surfaces activity did not occur: Safety/medical concerns         Wheelchair     Assist Is the patient using a wheelchair?: Yes Type of Wheelchair: Manual    Wheelchair assist level: Supervision/Verbal cueing Max wheelchair distance: 50    Wheelchair 50 feet with 2 turns activity    Assist        Assist Level: Supervision/Verbal cueing   Wheelchair 150 feet activity     Assist       Assist Level: Maximal Assistance - Patient 25 - 49%   Blood pressure 101/69, pulse 71, temperature 98.1 F (36.7 C), resp. rate 16, SpO2 97%.  Medical Problem List and Plan: 1. Functional deficits secondary to nontraumatic subarachnoid hemorrhage with hydrocephalus status post EVD removed 7/27 and initial burr hole             -patient may shower, cover incision please             -ELOS/Goals: 10-14 days, PT/OT/SLP sup - 12/21/23            Con't CIR PT OT and SLP    8/5: Family can provide 4-6 hours assistance per day. SLP saying will need full supervision.  Progressing well with therapies.   2.  Antithrombotics: -DVT/anticoagulation:  Pharmaceutical: Lovenox              -  antiplatelet therapy: N/A  3. Pain Management: Topamax  25 mg twice daily, Fioricet  as needed, oxycodone  5 mg every 4 hours as needed pain  8/2- needed Fioricet  and tylenol  for HA's today- con't regimen  8/3- HA a little more today- 5-6/10 but she reported this yesterday- will add Lidoderm  patches for low back/buttocks pain 8pm to 8am- explained canot add steroids secondary to NSU issues  8-5: Increase Topamax  to 50 mg twice daily.  Scheduled Tylenol  650 mg 3 times daily, reduce Fioricet  to 1 tablet every 6 hours as needed--patient reporting significant improvement with headaches since Topamax  increase  8-7: DC Topamax , possible contributor to ongoing urinary retention, headaches improving.  Continue with gabapentin .  8-8: Mild increase in headaches since medication adjustment, but tolerable with PRNs.  Continue  4. Mood/Behavior/Sleep: Provide emotional support             -antipsychotic agents: N/A 5. Neuropsych/cognition: This patient is not capable of making decisions on her own behalf. 6. Skin/Wound Care: Routine skin checks  8/2- although sutures are 3 weeks, will not remove until Ok'd by NSU due to concern about fluid drainage- is clear- not enough right now to test for CSF- d/w nursing as well as NICU  nursing and NSU-also con't Staples from EVD site and monitor-  NSU will come see tomorrow during rounds  8/3- appears to be a CSF leak, base don  halo- NSU will see tomorrow- don't see note so far, going off nursing report. See below for more in depth  8-4: Neurosurgery, Dr. Lanis saw yesterday, advised no changes given no signs of infection or CSF headache.  Will come back by today.  8-5: Surgical site looks good, no further drainage reported--per Dr. Lanis, if site remains stable, can remove staples and sutures 8-11  7. Fluids/Electrolytes/Nutrition: Routine in and outs with follow-up chemistries--stable  - 8-4: P.o. intakes poor, noted started on Remeron  over the weekend.  Will monitor for now.  8-5: Eating 50 to 100% of meals; significantly improved.  - Monitor p.o. intakes off of mirtazapine , doing well so far  8.  ?  Seizure.  Keppra  500 mg twice daily.   - Keppra  was initiated 8-1 for possible seizure, negative on EEG.  Will need to continue medication until neurosurgical follow-up  9.  Fevers/E. coli UTI/urinary retention/neurogenic bladder.  7-day course Ancef  completed.  Blood cultures/CSF no growth to date and placed on Bactrim  DS 1 tablet every 12 hours x 10 doses 12/08/2023 for possible UTI.  Currently on airborne and contact precautions pending COVID/RVP results. - 8/2- off precautions   Continue Flomax  0.8 mg daily, Urecholine  10 mg 3 times daily.   - Consider voiding trial in 2 to 3 days  8/2- will leave foley until Sunday/Monday- pt overwhelmed by the idea of in/out cath yet- will let her get used to CIR before removing- x1 day  8/3- foley removed- ordered to bladder scan q6 hours and cath if volmes >350cc- with lidocaine - went over with husband and pt- she's agreeable.   - 8-4: Requiring intermittent straight caths, bethanechol  stopped due to hypotension; continue monitoring today  8-5: Move Flomax  to nightly for hypotension, reduced to 0.4 mg  8/ 6: Had a few continent  voids overnight, still requiring intermittent ISC.  Start timed toileting every 4-6 hours, with orders for double voiding on attempts.  Continue Flomax  at current dose due to ongoing hypotension.  8-7: Add baclofen 10 mg 3 times daily, increase Flomax  to 0.8 mg nightly.  Adding  a.m. midodrine  to balance out.  DC Topamax , mirtazapine .  Getting renal ultrasound today for further workup.  Urinalysis negative.  Is having regular, daily bowel movements, so doubt contribution of constipation.  8-8: Renal ultrasound unremarkable.  Urinalysis negative.  Blood pressure holding up with current dose of Flomax , Urecholine .  Will give 24 hours for mirtazapine  to washout--patient aware that, if no improvement incontinent voids by tomorrow morning, will recommend replacement of Foley.    10.  Incidental finding of 7 mm nodular density in the medial right lung base.  Follow-up outpatient.  11.  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  Sodium 131-133.  Serum osmolality 289 urine osmolality 740 urine sodium 80.  Continue salt tablets 2 g 3 times daily.  Avoid hypotonic fluids.  8/2- Na better 134--normalized 8-4  8 /6: Reduce salt tabs to 1 g 3 times daily   8-7: BMP stable, reduce salt tabs 2 times daily.  8-9: BMP stable, DC salt tabs  12.  Vasospasm Prevention.  Nimodipine  60 mg every 4 hours. NSGY note indicates for 21 days (end date 8/3)  13.  Tobacco abuse.  NicoDerm patch.  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 Prior to this, husband reports was planning on  stopping vaping, so don't want to encourage that.  8-4: No apparent picking today.  14.  Overweight/Class I obesity.  Last BMI 29.80.  Dietary follow-up 15. Hypokalemia  8/2- K+ 3.4- will replete with 40 Meq x1.   Normalized 8-4  16.  Hypotension  8/2- will con't Nimodipine  however will add midodrine  2.5 mg TID as needed for soft BP- will  also order TEDs and ACE wraps if needed.  8/3- BP still soft- like running 90's systolic- will increase midodrine  to 5 mg TID prn- for SBP <100- shouldn't need once Nimodipine  is finished, hopefully  8-5: Abdominal binder, teds, move Flomax  to nightly as above.  Ordered 1 L normal saline at 100 cc/h over the course of today for additional support.  Labs in AM.  8-6: Labs stable, BPs remain soft but less symptomatic orthostasis.  Reducing salt tabs as above, monitor  8-7: DC mirtazapine , Topamax .  Adding midodrine  5 mg twice daily while adjusting bladder regimen as below.  orthostats ordered--doing okay     12/15/2023    7:55 PM 12/15/2023    4:06 PM 12/15/2023    4:35 AM  Vitals with BMI  Systolic 101 102 97  Diastolic 69 63 66  Pulse 71 67 65     17. Insomnia/restless at night  8/3- added Remeron  since bladder issues, cannot do Trazadone etc, and has decreased appetite.   8-4: Sleep improved last night.  Monitor.  8-7: DC Remeron  due to urinary retention.  Add as needed melatonin 5 mg, trazodone  50 mg second line  18.  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  - Lumbar x-rays ordered for initial workup IMPRESSION: 1. Mild degenerative disc disease at L2-L3, lesser at L4-L5. 2. Facet hypertrophy at L3-L4, L4-L5 and L5-S1.   - Start gabapentin  100 mg 3 times daily; stay low dose due to ongoing orthostasis  - 8-7: Symptoms likely secondary to lumbosacral radiculopathy versus sciatica given significant lumbar arthritis as above; recommend EMG as outpatient  - No complaints today 8-8  LOS: 7 days A FACE TO FACE EVALUATION WAS PERFORMED  Joesph JAYSON Likes 12/16/2023, 10:02 AM

## 2023-12-16 NOTE — Plan of Care (Signed)
  Problem: Consults Goal: RH BRAIN INJURY PATIENT EDUCATION Description: Description: See Patient Education module for eduction specifics Outcome: Progressing   Problem: RH BOWEL ELIMINATION Goal: RH STG MANAGE BOWEL WITH ASSISTANCE Description: STG Manage Bowel with supervision Assistance. Outcome: Progressing   Problem: RH BLADDER ELIMINATION Goal: RH STG MANAGE BLADDER WITH ASSISTANCE Description: STG Manage Bladder With  supervision Assistance Outcome: Progressing   Problem: RH SKIN INTEGRITY Goal: RH STG SKIN FREE OF INFECTION/BREAKDOWN Description: Manage skin free of infection / breakdown with supervision assistance Outcome: Progressing   Problem: RH SAFETY Goal: RH STG ADHERE TO SAFETY PRECAUTIONS W/ASSISTANCE/DEVICE Description: STG Adhere to Safety Precautions With supervision  Assistance/Device. Outcome: Progressing   Problem: RH PAIN MANAGEMENT Goal: RH STG PAIN MANAGED AT OR BELOW PT'S PAIN GOAL Outcome: Progressing   Problem: RH KNOWLEDGE DEFICIT BRAIN INJURY Goal: RH STG INCREASE KNOWLEDGE OF SELF CARE AFTER BRAIN INJURY Description: Manage increase knowledge of self care after brain injury with supervision assistance from son. Daughters using Transport planner provided.  Outcome: Progressing

## 2023-12-16 NOTE — Progress Notes (Signed)
 Occupational Therapy Weekly Progress Note  Patient Details  Name: Andrea Bridges MRN: [id number] Date of Birth: 16-Sep-1960  Beginning of progress report period: December 10, 2023 End of progress report period: December 16, 2023  Today's Date: 12/16/2023 OT Individual Time: 1005-1100 OT Individual Time Calculation (min): 55 min    Patient making strong progress towards LTGs this reporting period. Pt currently requires supervision with BADLs and functional transfers. Current cognitive barriers include decreased problem-solving skills, awareness, and short-term memory. Pt's orthostatic hypotension improving. Caregiver education completed 8/8 with discharge set for 8/13.    Patient continues to demonstrate the following deficits: muscle weakness, decreased cardiorespiratoy endurance, decreased attention, decreased awareness, decreased problem solving, decreased safety awareness, and decreased memory, and decreased balance strategies and therefore will continue to benefit from skilled OT intervention to enhance overall performance with BADL and Reduce care partner burden.  Patient progressing toward long term goals..  Continue plan of care.  OT Short Term Goals Week 1:  OT Short Term Goal 1 (Week 1): STGs=LTGs due to patient's estimated length of stay. OT Short Term Goal 1 - Progress (Week 1): Progressing toward goal Week 2:  OT Short Term Goal 1 (Week 2): STGs=LTGs due to patient's estimated length of stay.  Skilled Therapeutic Interventions/Progress Updates:  Pt greeted resting in room, brother/niece present in room for scheduled family/caregiver education. Focus placed on ADL routine and functional transfers alongside implications of brain injury symptomology. OT reviews need/purpose for use of abdominal binder and B TEDs, as well as, signs/symptoms of orthostatic hypotension as it relates to bathing within shower context. Advised daily BP measurements and schedule bathing tasks during late AM/PM hours  to allow for BP adjustment prior to taking a shower. Pt ambulates from room>ADL apartment with supervision, CGA intermediately due to external distractions. In ADL apartment, pt completes tub/shower transfer, stepping into shower with unilateral support of wall. Educated on benefits of permanent grab bar and use of TTB (already owned). Discussed modifications for IADL participation for promotion of safe activity participation. Reviewed benefits of making a daily schedule with inclusion of physical and cognitive tasks for continued rehabilitation at home, outside of OP therapies.   In main therapy gym, pt then instructed in pipe tree activity to target selective attention, problem-solving, decision-making, and sequencing. Pt requires Min A to problem solve modifications needed in lieu of missing puzzle pieces.   Therapy Documentation Precautions:  Precautions Precautions: Fall Recall of Precautions/Restrictions: Impaired Precaution/Restrictions Comments: SBP <160 Restrictions Weight Bearing Restrictions Per Provider Order: No   Therapy/Group: Individual Therapy  [name], OTR/L, MSOT  12/16/2023, 5:07 AM

## 2023-12-16 NOTE — Progress Notes (Signed)
 Speech Language Pathology Weekly Progress and Session Note  Patient Details  Name: Andrea Bridges MRN: [id number] Date of Birth: [birth year]  Beginning of progress report period: December 10, 2023 End of progress report period: December 16, 2023  Today's Date: 12/16/2023 SLP Individual Time: 1030-1100 SLP Individual Time Calculation (min): 30 min  Short Term Goals: Week 1: SLP Short Term Goal 1 (Week 1): STG = LTG due to ELOS SLP Short Term Goal 1 - Progress (Week 1): Progressing toward goal    New Short Term Goals: Week 2: SLP Short Term Goal 1 (Week 2): STGs = LTGs d/t ELOS  Weekly Progress Updates: Pt has made excellent progress this week, as evidenced by improving attention, STM, orientation, awareness, and problem solving. STGs were not set originally d/t ELOS, however, this was extended slightly and pt remains on track to meet LTGs of minA for cognition. Mild to moderate deficits remain overall, though emerging success noted w/ carryover of information and error awareness. Pt/family education ongoing. She would benefit from continued ST upon d/c to target remaining deficits, maximize pt independence, and facilitate return to prev roles/responsibilities.    Intensity: Minumum of 1-2 x/day, 30 to 90 minutes Frequency: 3 to 5 out of 7 days Duration/Length of Stay: 8/13 Treatment/Interventions: Cognitive remediation/compensation;Cueing hierarchy;Functional tasks;Internal/external aids;Patient/family education;Therapeutic Activities   Daily Session  Skilled Therapeutic Interventions:     Pt and family greeted at bedside. She was very pleasant and cooperative throughout tx tasks targeting cognition and family education. She was able to independently utilize her calendar to provide orientation information. Also utilized her schedule to assist w/ recall of specific times of Pt/OT, but was independently able to recall 2-3 tasks completed in each tx session. SLP provided education to her family re ST  goals as well as remaining cognitive deficits and their subsequent negative impact on return to PLOF at this time. Provided education re memory aids in the home environment, including Alexa or Siri for reminders/timers and calendars. Additional questions were answered and she was left in bed with the alarm set and call light within reach. Recommend cont ST per POC.   Pain  None endorsed  Therapy/Group: Individual Therapy  [name] 12/16/2023, 1:14 PM

## 2023-12-16 NOTE — Progress Notes (Signed)
 Physical Therapy Weekly Progress Note  Patient Details  Name: Andrea Bridges MRN: [id number] Date of Birth: September 03, 1960  Beginning of progress report period: December 10, 2023 End of progress report period: December 16, 2023  Today's Date: 12/16/2023 PT Individual Time: 0815-0900 + [id number] PT Individual Time Calculation (min): 45 min + 69 min   Short term goals not set due to ELOS <10 days however pt continues to progress well towards long term goals. Pt currently completes bed mobility with supervision/modI, completes transfers with supervision/CGA, ambulates without device with supervision/CGA >300' in controlled environemnt, household environment, and community environments, ambulates up/down 12 steps with BHRs supervision. Family education completed  Patient continues to demonstrate the following deficits muscle weakness, decreased cardiorespiratoy endurance, decreased coordination, decreased attention to left, decreased attention, decreased awareness, decreased problem solving, decreased safety awareness, decreased memory, and delayed processing, and decreased standing balance, decreased postural control, and decreased balance strategies and therefore will continue to benefit from skilled PT intervention to increase functional independence with mobility.  Patient progressing toward long term goals..  Continue plan of care.  PT Short Term Goals Week 1:  PT Short Term Goal 1 (Week 1): STGs = LTGs PT Short Term Goal 1 - Progress (Week 1): Progressing toward goal Week 2:  PT Short Term Goal 1 (Week 2): STG = LTG due to ELOS  Skilled Therapeutic Interventions/Progress Updates:  SESSION 1: Pt presents in room in bed, agreeable to PT. Pt brother and niece present for family education. Pt denies pain during session. Session focused on therapeutic activities for education on pt safety with mobility with emphasis on gait in controlled and community environments, balance deficits with fatigue, stair  negotiation, and orthostatic hypotension. Pt completes bed mobility and transfers with supervision throughout sesison. Pt ambulates on unit without device with supervision up to >500' ambulating from room to main gym, to ortho gym, and then outside to reflection pond on first floor. Pt completes up/down 12 steps with BHRs with supervision, completes car transfer without device with supervision, up down ramp, over mulch and up/down curb with supervision. Pt demonstrating increasing distractibility when in community environment, demonstrating increasing crossover stepping and requiring CGA for postural stability with distractions. Pt also with notable fatigue ambulating more slowly upon return back to unit and return to room. Vitals assessed and demonstrating BP 85/60 with TED hose and abdominal binder donned. Therapist provides education on orthostatic hypotension, nonpharmacologic interventions, position changes and symptoms to look for with pt family verbalizing understanding. Pt returns to room and remains semireclined in bed with all needs within reach, cal light in place and bed alarm activated at end of session.    SESSION 2: Pt presents seated EOB with NT present taking vitals. Pt agreeable to PT, reporting headache pain, RN notified and present for medication administration. Session on therapeutic activities for educating pt and pt daughter on safety with mobility as well as NMR for dynamic standing balance, BLE coordination, dual tasking. Pt completes bed mobility supervision/modI, completes transfers with supervision throughout session. Pt ambulates to/from bathroom with supervision provided by daughter, daughter cleared for ambulating and transfers with pt in room. Pt ambulates to day room with supervision and comes to sitting on EOM. Pt completes NMR with agility ladder for dynamic functional balance and BLE coordinatoin including: - forward gait (one foot in each square) - side stepping -  backwards step to gait - in and outs - step with tapping cone x4 trials (cues for R lateral weightshift) - sell ball  toss two hand toss and catch forward step, side stepping, backwards step to gait Pt then completes continuous training on nustep L4 for 10 min for increasing activity tolerance and BUE/BLE muscle fiber recruitment needed for functional ambulation and transfers. Pt returns to room and remains supine in bed with all needs within reach, cal light in place and chair alarm donned and activated at end of session.  Vitals: Sitting following prolonged standing and ambulating: BP 95/58 (71) HR 107  Ambulation/gait training;Balance/vestibular training;Cognitive remediation/compensation;DME/adaptive equipment instruction;Discharge planning;Functional mobility training;Neuromuscular re-education;Patient/family education;Stair training;Splinting/orthotics;Therapeutic Activities;Therapeutic Exercise;UE/LE Strength taining/ROM;UE/LE Coordination activities;Wheelchair propulsion/positioning   Therapy Documentation Precautions:  Precautions Precautions: Fall Recall of Precautions/Restrictions: Impaired Precaution/Restrictions Comments: SBP <160 Restrictions Weight Bearing Restrictions Per Provider Order: No   Therapy/Group: Individual Therapy  Reche Ohara 12/16/2023, 2:18 PM

## 2023-12-17 DIAGNOSIS — I959 Hypotension, unspecified: Secondary | ICD-10-CM

## 2023-12-17 DIAGNOSIS — R519 Headache, unspecified: Secondary | ICD-10-CM

## 2023-12-17 NOTE — Progress Notes (Signed)
 PROGRESS NOTE   Subjective/Complaints:  Patient was able to void yesterday but did not need In-N-Out catheterization today.   ROS:  Pt denies chills, SOB, abd pain, CP, N/V/C/D, and vision changes + Headache--ongoing, intermittent + Urinary retention--ongoing  Objective:   US  RENAL Result Date: 12/15/2023 CLINICAL DATA:  Urinary retention EXAM: RENAL / URINARY TRACT ULTRASOUND COMPLETE COMPARISON:  None Available. FINDINGS: Right Kidney: Renal measurements: 10.0 x 4.6 x 5.0 cm = volume: 121.1 mL. Echogenicity within normal limits. No mass or hydronephrosis visualized. Left Kidney: Renal measurements: 13.0 x 5.6 x 4.4 cm = volume: 168.9 mL. Echogenicity within normal limits. No mass or hydronephrosis visualized. Bladder: Appears normal for degree of bladder distention. Other: None. IMPRESSION: 1. Unremarkable renal ultrasound. Electronically Signed   By: Ozell Daring M.D.   On: 12/15/2023 22:04   No results for input(s): WBC, HGB, HCT, PLT in the last 72 hours.  Recent Labs    12/16/23 0545  NA 142  K 4.1  CL 108  CO2 23  GLUCOSE 98  BUN 16  CREATININE 0.75  CALCIUM 9.3    Intake/Output Summary (Last 24 hours) at 12/17/2023 1445 Last data filed at 12/17/2023 1400 Gross per 24 hour  Intake 356 ml  Output 425 ml  Net -69 ml        Physical Exam: Vital Signs Blood pressure 102/62, pulse 74, temperature 97.7 F (36.5 C), resp. rate 18, SpO2 98%.  General: awake, alert, relaxed, no acute distress.  Laying in bed.   HENT: conjugate gaze; oropharynx moist  bur hole site open to air.  No obvious drainage.  staples and sutures, Steri-Strips on EVD site-intact.  No apparent drainage over site.  No fluctuance on palpation.  CV: regular rate and rhythm; no JVD Pulmonary: CTA B/L; no W/R/R- good air movement GI: soft, NT, ND, (+)BS. + Lower abdominal tenderness with palpation. Psychiatric: appropriate mood and  affect  Neuro:   Mental Status: Alert and oriented x4,  Following commands.  + Memory deficits, concentration difficulty, poor insight --improving daily Moves all 4 extremities equally antigravity against resistance, 5- out of 5 throughout Cranial nerves II through XII intact REFLEXES: DTR 2+ patella and Achilles bilaterally, no beats of clonus b/l ankles SENSORY: Normal to touch all 4 extremities   MSK: no joint swelling noted   Assessment/Plan: 1. Functional deficits which require 3+ hours per day of interdisciplinary therapy in a comprehensive inpatient rehab setting. Physiatrist is providing close team supervision and 24 hour management of active medical problems listed below. Physiatrist and rehab team continue to assess barriers to discharge/monitor patient progress toward functional and medical goals  Care Tool:  Bathing    Body parts bathed by patient: Right arm, Left arm, Chest, Abdomen, Front perineal area, Buttocks, Right upper leg, Left upper leg, Right lower leg, Left lower leg, Face         Bathing assist Assist Level: Contact Guard/Touching assist     Upper Body Dressing/Undressing Upper body dressing   What is the patient wearing?: Bra, Pull over shirt    Upper body assist Assist Level: Supervision/Verbal cueing    Lower Body Dressing/Undressing Lower body dressing  What is the patient wearing?: Underwear/pull up, Pants     Lower body assist Assist for lower body dressing: Contact Guard/Touching assist     Toileting Toileting    Toileting assist Assist for toileting: Contact Guard/Touching assist     Transfers Chair/bed transfer  Transfers assist     Chair/bed transfer assist level: Moderate Assistance - Patient 50 - 74%     Locomotion Ambulation   Ambulation assist      Assist level: Moderate Assistance - Patient 50 - 74% Assistive device: Hand held assist Max distance: 72   Walk 10 feet activity   Assist     Assist  level: Moderate Assistance - Patient - 50 - 74% Assistive device: Hand held assist   Walk 50 feet activity   Assist    Assist level: Moderate Assistance - Patient - 50 - 74% Assistive device: Hand held assist    Walk 150 feet activity   Assist Walk 150 feet activity did not occur: Safety/medical concerns         Walk 10 feet on uneven surface  activity   Assist Walk 10 feet on uneven surfaces activity did not occur: Safety/medical concerns         Wheelchair     Assist Is the patient using a wheelchair?: Yes Type of Wheelchair: Manual    Wheelchair assist level: Supervision/Verbal cueing Max wheelchair distance: 50    Wheelchair 50 feet with 2 turns activity    Assist        Assist Level: Supervision/Verbal cueing   Wheelchair 150 feet activity     Assist      Assist Level: Maximal Assistance - Patient 25 - 49%   Blood pressure 102/62, pulse 74, temperature 97.7 F (36.5 C), resp. rate 18, SpO2 98%.  Medical Problem List and Plan: 1. Functional deficits secondary to nontraumatic subarachnoid hemorrhage with hydrocephalus status post EVD removed 7/27 and initial burr hole             -patient may shower, cover incision please             -ELOS/Goals: 10-14 days, PT/OT/SLP sup - 12/21/23            Con't CIR PT OT and SLP    8/5: Family can provide 4-6 hours assistance per day. SLP saying will need full supervision.  Progressing well with therapies.   2.  Antithrombotics: -DVT/anticoagulation:  Pharmaceutical: Lovenox              -antiplatelet therapy: N/A  3. Pain Management: Topamax  25 mg twice daily, Fioricet  as needed, oxycodone  5 mg every 4 hours as needed pain  8/2- needed Fioricet  and tylenol  for HA's today- con't regimen  8/3- HA a little more today- 5-6/10 but she reported this yesterday- will add Lidoderm  patches for low back/buttocks pain 8pm to 8am- explained canot add steroids secondary to NSU issues  8-5: Increase Topamax   to 50 mg twice daily.  Scheduled Tylenol  650 mg 3 times daily, reduce Fioricet  to 1 tablet every 6 hours as needed--patient reporting significant improvement with headaches since Topamax  increase  8-7: DC Topamax , possible contributor to ongoing urinary retention, headaches improving.  Continue with gabapentin .  8-8: Mild increase in headaches since medication adjustment, but tolerable with PRNs.  Continue  8/9 headaches stable, continue current regimen to monitor  4. Mood/Behavior/Sleep: Provide emotional support             -antipsychotic agents: N/A 5. Neuropsych/cognition: This patient is not  capable of making decisions on her own behalf. 6. Skin/Wound Care: Routine skin checks  8/2- although sutures are 3 weeks, will not remove until Ok'd by NSU due to concern about fluid drainage- is clear- not enough right now to test for CSF- d/w nursing as well as NICU nursing and NSU-also con't Staples from EVD site and monitor-  NSU will come see tomorrow during rounds  8/3- appears to be a CSF leak, base don  halo- NSU will see tomorrow- don't see note so far, going off nursing report. See below for more in depth  8-4: Neurosurgery, Dr. Lanis saw yesterday, advised no changes given no signs of infection or CSF headache.  Will come back by today.  8-5: Surgical site looks good, no further drainage reported--per Dr. Lanis, if site remains stable, can remove staples and sutures 8-11  7. Fluids/Electrolytes/Nutrition: Routine in and outs with follow-up chemistries--stable  - 8-4: P.o. intakes poor, noted started on Remeron  over the weekend.  Will monitor for now.  8-5: Eating 50 to 100% of meals; significantly improved.  - Monitor p.o. intakes off of mirtazapine , doing well so far  8.  ?  Seizure.  Keppra  500 mg twice daily.   - Keppra  was initiated 8-1 for possible seizure, negative on EEG.  Will need to continue medication until neurosurgical follow-up  9.  Fevers/E. coli UTI/urinary  retention/neurogenic bladder.  7-day course Ancef  completed.  Blood cultures/CSF no growth to date and placed on Bactrim  DS 1 tablet every 12 hours x 10 doses 12/08/2023 for possible UTI.  Currently on airborne and contact precautions pending COVID/RVP results. - 8/2- off precautions   Continue Flomax  0.8 mg daily, Urecholine  10 mg 3 times daily.   - Consider voiding trial in 2 to 3 days  8/2- will leave foley until Sunday/Monday- pt overwhelmed by the idea of in/out cath yet- will let her get used to CIR before removing- x1 day  8/3- foley removed- ordered to bladder scan q6 hours and cath if volmes >350cc- with lidocaine - went over with husband and pt- she's agreeable.   - 8-4: Requiring intermittent straight caths, bethanechol  stopped due to hypotension; continue monitoring today  8-5: Move Flomax  to nightly for hypotension, reduced to 0.4 mg  8/ 6: Had a few continent voids overnight, still requiring intermittent ISC.  Start timed toileting every 4-6 hours, with orders for double voiding on attempts.  Continue Flomax  at current dose due to ongoing hypotension.  8-7: Add baclofen 10 mg 3 times daily, increase Flomax  to 0.8 mg nightly.  Adding a.m. midodrine  to balance out.  DC Topamax , mirtazapine .  Getting renal ultrasound today for further workup.  Urinalysis negative.  Is having regular, daily bowel movements, so doubt contribution of constipation.  8-8: Renal ultrasound unremarkable.  Urinalysis negative.  Blood pressure holding up with current dose of Flomax , Urecholine .  Will give 24 hours for mirtazapine  to washout--patient aware that, if no improvement incontinent voids by tomorrow morning, will recommend replacement of Foley.  8/9 required intermittent cath today but yesterday appears was voiding better, hold off on Foley for now    10.  Incidental finding of 7 mm nodular density in the medial right lung base.  Follow-up outpatient.  11.  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  Sodium 131-133.  Serum  osmolality 289 urine osmolality 740 urine sodium 80.  Continue salt tablets 2 g 3 times daily.  Avoid hypotonic fluids.  8/2- Na better 134--normalized 8-4  8 /6: Reduce salt tabs to 1 g  3 times daily   8-7: BMP stable, reduce salt tabs 2 times daily.  8-8: BMP stable, DC salt tabs  12.  Vasospasm Prevention.  Nimodipine  60 mg every 4 hours. NSGY note indicates for 21 days (end date 8/3)  13.  Tobacco abuse.  NicoDerm patch.  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 Prior to this, husband reports was planning on  stopping vaping, so don't want to encourage that.  8-4: No apparent picking today.  14.  Overweight/Class I obesity.  Last BMI 29.80.  Dietary follow-up 15. Hypokalemia  8/2- K+ 3.4- will replete with 40 Meq x1.   Normalized 8-4  16.  Hypotension  8/2- will con't Nimodipine  however will add midodrine  2.5 mg TID as needed for soft BP- will also order TEDs and ACE wraps if needed.  8/3- BP still soft- like running 90's systolic- will increase midodrine  to 5 mg TID prn- for SBP <100- shouldn't need once Nimodipine  is finished, hopefully  8-5: Abdominal binder, teds, move Flomax  to nightly as above.  Ordered 1 L normal saline at 100 cc/h over the course of today for additional support.  Labs in AM.  8-6: Labs stable, BPs remain soft but less symptomatic orthostasis.  Reducing salt tabs as above, monitor  8-7: DC mirtazapine , Topamax .  Adding midodrine  5 mg twice daily while adjusting bladder regimen as below.  orthostats ordered--doing okay  8/9  BP soft, continue monitor trend for now, consider increase midodrine      12/17/2023    2:29 PM 12/17/2023    5:29 AM 12/16/2023    8:59 PM  Vitals with BMI  Systolic 102 89 104  Diastolic 62 54 64  Pulse 74 58 71     17. Insomnia/restless at night  8/3- added Remeron  since bladder issues, cannot do Trazadone etc, and has  decreased appetite.   8-4: Sleep improved last night.  Monitor.  8-7: DC Remeron  due to urinary retention.  Add as needed melatonin 5 mg, trazodone  50 mg second line  18.  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  - Lumbar x-rays ordered for initial workup IMPRESSION: 1. Mild degenerative disc disease at L2-L3, lesser at L4-L5. 2. Facet hypertrophy at L3-L4, L4-L5 and L5-S1.   - Start gabapentin  100 mg 3 times daily; stay low dose due to ongoing orthostasis  - 8-7: Symptoms likely secondary to lumbosacral radiculopathy versus sciatica given significant lumbar arthritis as above; recommend EMG as outpatient  - No complaints today 8-8  LOS: 8 days A FACE TO FACE EVALUATION WAS PERFORMED  Murray Collier 12/17/2023, 2:45 PM

## 2023-12-17 NOTE — Progress Notes (Signed)
 Occupational Therapy Session Note  Patient Details  Name: Andrea Bridges MRN: [id number] Date of Birth: 10/09/1960  Today's Date: 12/17/2023 OT Individual Time: 9182-9080 OT Individual Time Calculation (min): 62 min  and Today's Date: 12/17/2023 OT Missed Time: 10 Minutes Missed Time Reason: Nursing care   Short Term Goals: Week 2:  OT Short Term Goal 1 (Week 2): STGs=LTGs due to patient's estimated length of stay.  Skilled Therapeutic Interventions/Progress Updates:  Pt greeted resting in bed, urgently waiting for nursing staff for bladder scan due to sensation of fullness. OT assisting with ambulatory toilet transfer to attempt independent void, no success, RN then steps into room to manage cathing. Pt missing ~ 10 mins of skilled intervention. Upon OT return, session with focus on BADL retraining, functional transfers/mobility, and psychosocial wellness. Pt's initial BP=96/55. Pt ambulates around room with supervision to gather necessary clothing garments. Full-body bathing with supervision, intermediate use of grab bar for standing pericare. Min cuing for seated rinsing/drying to decrease fall risk and manage potential OH. Pt dresses with supervision, dependent for donning B TEDs. Pt inquiring about follow-up mental health services to manage past/current stressors. OT to communicate this need with DO/SW. Time dedicated to process current feelings/emotions related to familial relationships/roles/boundaries with emphasis placed on impact of brain injury. OT emphasizes the importance of balancing safety and autonomy as well as prioritizing holistic recovery. Pt appreciative of conversation and open to resources available. Pt remained resting in bed with 4Ps assessed and immediate needs met. Pt continues to be appropriate for skilled OT intervention to promote further functional independence in ADLs/IADLs.   Therapy Documentation Precautions:  Precautions Precautions: Fall Recall of  Precautions/Restrictions: Impaired Precaution/Restrictions Comments: SBP <160 Restrictions Weight Bearing Restrictions Per Provider Order: No   Therapy/Group: Individual Therapy  [name], OTR/L, MSOT  12/17/2023, 5:17 AM

## 2023-12-17 NOTE — Plan of Care (Signed)
  Problem: RH SKIN INTEGRITY Goal: RH STG SKIN FREE OF INFECTION/BREAKDOWN Description: Manage skin free of infection / breakdown with supervision assistance Outcome: Progressing   Problem: RH SAFETY Goal: RH STG ADHERE TO SAFETY PRECAUTIONS W/ASSISTANCE/DEVICE Description: STG Adhere to Safety Precautions With supervision  Assistance/Device. Outcome: Progressing   Problem: RH PAIN MANAGEMENT Goal: RH STG PAIN MANAGED AT OR BELOW PT'S PAIN GOAL Outcome: Progressing

## 2023-12-18 DIAGNOSIS — M47819 Spondylosis without myelopathy or radiculopathy, site unspecified: Secondary | ICD-10-CM

## 2023-12-18 MED ORDER — MIDODRINE HCL 5 MG PO TABS
5.0000 mg | ORAL_TABLET | Freq: Three times a day (TID) | ORAL | Status: DC
  Administered 2023-12-18 – 2023-12-21 (×15): 5 mg via ORAL
  Filled 2023-12-18 (×8): qty 1

## 2023-12-18 MED ORDER — MIDODRINE HCL 5 MG PO TABS: 5.0000 mg | ORAL_TABLET | Freq: Three times a day (TID) | ORAL | Status: DC

## 2023-12-18 NOTE — Progress Notes (Addendum)
 PROGRESS NOTE   Subjective/Complaints:  Patient has been voiding better today, had bladder scans they were not particularly elevated.  Continues to have intermittent headache that is controlled with medications.   ROS:  Pt denies chills, SOB, abd pain, CP, N/V/C/D, and vision changes + Headache--ongoing, intermittent + Urinary retention--improved  Objective:   No results found.  No results for input(s): WBC, HGB, HCT, PLT in the last 72 hours.  Recent Labs    12/16/23 0545  NA 142  K 4.1  CL 108  CO2 23  GLUCOSE 98  BUN 16  CREATININE 0.75  CALCIUM 9.3    Intake/Output Summary (Last 24 hours) at 12/18/2023 1743 Last data filed at 12/18/2023 1126 Gross per 24 hour  Intake --  Output 375 ml  Net -375 ml        Physical Exam: Vital Signs Blood pressure 97/71, pulse 70, temperature 97.7 F (36.5 C), temperature source Oral, resp. rate 18, SpO2 100%.  General: awake, alert, relaxed, no acute distress.  Sitting up in bed HENT: conjugate gaze; oropharynx moist  bur hole site open to air.  No obvious drainage.  staples and sutures, Steri-Strips on EVD site-intact.  No apparent drainage over site.  No fluctuance on palpation.  CV: regular rate and rhythm; no JVD Pulmonary: CTA B/L; no W/R/R- good air movement GI: soft, NT, ND, (+)BS. + Lower abdominal tenderness with palpation. Psychiatric: appropriate mood and affect  Neuro:   Mental Status: Alert and oriented x4,  Following commands.  + Memory deficits, concentration difficulty, poor insight --improving daily Moves all 4 extremities equally antigravity against resistance, 5- out of 5 throughout Cranial nerves II through XII intact REFLEXES: DTR 2+ patella and Achilles bilaterally, no beats of clonus b/l ankles SENSORY: Normal to touch all 4 extremities  Prior neuro assessment is c/w today's exam 12/18/2023.    MSK: no joint swelling  noted   Assessment/Plan: 1. Functional deficits which require 3+ hours per day of interdisciplinary therapy in a comprehensive inpatient rehab setting. Physiatrist is providing close team supervision and 24 hour management of active medical problems listed below. Physiatrist and rehab team continue to assess barriers to discharge/monitor patient progress toward functional and medical goals  Care Tool:  Bathing    Body parts bathed by patient: Right arm, Left arm, Chest, Abdomen, Front perineal area, Buttocks, Right upper leg, Left upper leg, Right lower leg, Left lower leg, Face         Bathing assist Assist Level: Contact Guard/Touching assist     Upper Body Dressing/Undressing Upper body dressing   What is the patient wearing?: Bra, Pull over shirt    Upper body assist Assist Level: Supervision/Verbal cueing    Lower Body Dressing/Undressing Lower body dressing      What is the patient wearing?: Underwear/pull up, Pants     Lower body assist Assist for lower body dressing: Contact Guard/Touching assist     Toileting Toileting    Toileting assist Assist for toileting: Contact Guard/Touching assist     Transfers Chair/bed transfer  Transfers assist     Chair/bed transfer assist level: Moderate Assistance - Patient 50 - 74%  Locomotion Ambulation   Ambulation assist      Assist level: Moderate Assistance - Patient 50 - 74% Assistive device: Hand held assist Max distance: 72   Walk 10 feet activity   Assist     Assist level: Moderate Assistance - Patient - 50 - 74% Assistive device: Hand held assist   Walk 50 feet activity   Assist    Assist level: Moderate Assistance - Patient - 50 - 74% Assistive device: Hand held assist    Walk 150 feet activity   Assist Walk 150 feet activity did not occur: Safety/medical concerns         Walk 10 feet on uneven surface  activity   Assist Walk 10 feet on uneven surfaces activity did not  occur: Safety/medical concerns         Wheelchair     Assist Is the patient using a wheelchair?: Yes Type of Wheelchair: Manual    Wheelchair assist level: Supervision/Verbal cueing Max wheelchair distance: 50    Wheelchair 50 feet with 2 turns activity    Assist        Assist Level: Supervision/Verbal cueing   Wheelchair 150 feet activity     Assist      Assist Level: Maximal Assistance - Patient 25 - 49%   Blood pressure 97/71, pulse 70, temperature 97.7 F (36.5 C), temperature source Oral, resp. rate 18, SpO2 100%.  Medical Problem List and Plan: 1. Functional deficits secondary to nontraumatic subarachnoid hemorrhage with hydrocephalus status post EVD removed 7/27 and initial burr hole             -patient may shower, cover incision please             -ELOS/Goals: 10-14 days, PT/OT/SLP sup - 12/21/23            Con't CIR PT OT and SLP    8/5: Family can provide 4-6 hours assistance per day. SLP saying will need full supervision.  Progressing well with therapies.   2.  Antithrombotics: -DVT/anticoagulation:  Pharmaceutical: Lovenox              -antiplatelet therapy: N/A  3. Pain Management: Topamax  25 mg twice daily, Fioricet  as needed, oxycodone  5 mg every 4 hours as needed pain  8/2- needed Fioricet  and tylenol  for HA's today- con't regimen  8/3- HA a little more today- 5-6/10 but she reported this yesterday- will add Lidoderm  patches for low back/buttocks pain 8pm to 8am- explained canot add steroids secondary to NSU issues  8-5: Increase Topamax  to 50 mg twice daily.  Scheduled Tylenol  650 mg 3 times daily, reduce Fioricet  to 1 tablet every 6 hours as needed--patient reporting significant improvement with headaches since Topamax  increase  8-7: DC Topamax , possible contributor to ongoing urinary retention, headaches improving.  Continue with gabapentin .  8-8: Mild increase in headaches since medication adjustment, but tolerable with PRNs.   Continue  8/10 headaches overall controlled with current regimen, continue to monitor  4. Mood/Behavior/Sleep: Provide emotional support             -antipsychotic agents: N/A 5. Neuropsych/cognition: This patient is not capable of making decisions on her own behalf. 6. Skin/Wound Care: Routine skin checks  8/2- although sutures are 3 weeks, will not remove until Ok'd by NSU due to concern about fluid drainage- is clear- not enough right now to test for CSF- d/w nursing as well as NICU nursing and NSU-also con't Staples from EVD site and monitor-  NSU will come  see tomorrow during rounds  8/3- appears to be a CSF leak, base don  halo- NSU will see tomorrow- don't see note so far, going off nursing report. See below for more in depth  8-4: Neurosurgery, Dr. Lanis saw yesterday, advised no changes given no signs of infection or CSF headache.  Will come back by today.  8-5: Surgical site looks good, no further drainage reported--per Dr. Lanis, if site remains stable, can remove staples and sutures 8-11  Consider staple removal tomorrow  7. Fluids/Electrolytes/Nutrition: Routine in and outs with follow-up chemistries--stable  - 8-4: P.o. intakes poor, noted started on Remeron  over the weekend.  Will monitor for now.  8-5: Eating 50 to 100% of meals; significantly improved.  - Monitor p.o. intakes off of mirtazapine , doing well so far  8.  ?  Seizure.  Keppra  500 mg twice daily.   - Keppra  was initiated 8-1 for possible seizure, negative on EEG.  Will need to continue medication until neurosurgical follow-up  9.  Fevers/E. coli UTI/urinary retention/neurogenic bladder.  7-day course Ancef  completed.  Blood cultures/CSF no growth to date and placed on Bactrim  DS 1 tablet every 12 hours x 10 doses 12/08/2023 for possible UTI.  Currently on airborne and contact precautions pending COVID/RVP results. - 8/2- off precautions   Continue Flomax  0.8 mg daily, Urecholine  10 mg 3 times daily.   -  Consider voiding trial in 2 to 3 days  8/2- will leave foley until Sunday/Monday- pt overwhelmed by the idea of in/out cath yet- will let her get used to CIR before removing- x1 day  8/3- foley removed- ordered to bladder scan q6 hours and cath if volmes >350cc- with lidocaine - went over with husband and pt- she's agreeable.   - 8-4: Requiring intermittent straight caths, bethanechol  stopped due to hypotension; continue monitoring today  8-5: Move Flomax  to nightly for hypotension, reduced to 0.4 mg  8/ 6: Had a few continent voids overnight, still requiring intermittent ISC.  Start timed toileting every 4-6 hours, with orders for double voiding on attempts.  Continue Flomax  at current dose due to ongoing hypotension.  8-7: Add baclofen 10 mg 3 times daily, increase Flomax  to 0.8 mg nightly.  Adding a.m. midodrine  to balance out.  DC Topamax , mirtazapine .  Getting renal ultrasound today for further workup.  Urinalysis negative.  Is having regular, daily bowel movements, so doubt contribution of constipation.  8-8: Renal ultrasound unremarkable.  Urinalysis negative.  Blood pressure holding up with current dose of Flomax , Urecholine .  Will give 24 hours for mirtazapine  to washout--patient aware that, if no improvement incontinent voids by tomorrow morning, will recommend replacement of Foley.  8/9 required intermittent cath today but yesterday appears was voiding better, hold off on Foley for now  8/10 bladder function appears to be doing better, continue to monitor bladder scans and PVRs for now    10.  Incidental finding of 7 mm nodular density in the medial right lung base.  Follow-up outpatient.  11.  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  Sodium 131-133.  Serum osmolality 289 urine osmolality 740 urine sodium 80.  Continue salt tablets 2 g 3 times daily.  Avoid hypotonic fluids.  8/2- Na better 134--normalized 8-4  8 /6: Reduce salt tabs to 1 g 3 times daily   8-7: BMP stable, reduce salt tabs 2 times  daily.  8-8: BMP stable, DC salt tabs  12.  Vasospasm Prevention.  Nimodipine  60 mg every 4 hours. NSGY note indicates for 21 days (end date  8/3)  13.  Tobacco abuse.  NicoDerm patch.  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 Prior to this, husband reports was planning on  stopping vaping, so don't want to encourage that.  8-4: No apparent picking today.  14.  Overweight/Class I obesity.  Last BMI 29.80.  Dietary follow-up 15. Hypokalemia  8/2- K+ 3.4- will replete with 40 Meq x1.   Normalized 8-4  16.  Hypotension  8/2- will con't Nimodipine  however will add midodrine  2.5 mg TID as needed for soft BP- will also order TEDs and ACE wraps if needed.  8/3- BP still soft- like running 90's systolic- will increase midodrine  to 5 mg TID prn- for SBP <100- shouldn't need once Nimodipine  is finished, hopefully  8-5: Abdominal binder, teds, move Flomax  to nightly as above.  Ordered 1 L normal saline at 100 cc/h over the course of today for additional support.  Labs in AM.  8-6: Labs stable, BPs remain soft but less symptomatic orthostasis.  Reducing salt tabs as above, monitor  8-7: DC mirtazapine , Topamax .  Adding midodrine  5 mg twice daily while adjusting bladder regimen as below.  orthostats ordered--doing okay  8/10 Increase midodrine  to 5mg  TID     12/18/2023    1:33 PM 12/18/2023    6:19 AM 12/17/2023    8:00 PM  Vitals with BMI  Systolic 97 99 94  Diastolic 71 53 55  Pulse 70 76 71     17. Insomnia/restless at night  8/3- added Remeron  since bladder issues, cannot do Trazadone etc, and has decreased appetite.   8-4: Sleep improved last night.  Monitor.  8-7: DC Remeron  due to urinary retention.  Add as needed melatonin 5 mg, trazodone  50 mg second line  18.  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  - Lumbar x-rays ordered for initial  workup IMPRESSION: 1. Mild degenerative disc disease at L2-L3, lesser at L4-L5. 2. Facet hypertrophy at L3-L4, L4-L5 and L5-S1.   - Start gabapentin  100 mg 3 times daily; stay low dose due to ongoing orthostasis  - 8-7: Symptoms likely secondary to lumbosacral radiculopathy versus sciatica given significant lumbar arthritis as above; recommend EMG as outpatient  - No complaints today 8-8  - Remains improved 8/10  LOS: 9 days A FACE TO FACE EVALUATION WAS PERFORMED  Murray Collier 12/18/2023, 5:43 PM

## 2023-12-19 MED ORDER — SORBITOL 70 % SOLN
30.0000 mL | Status: DC | PRN
  Administered 2023-12-19 (×2): 30 mL via ORAL
  Filled 2023-12-19: qty 30

## 2023-12-19 MED ORDER — DOCUSATE SODIUM 100 MG PO CAPS
100.0000 mg | ORAL_CAPSULE | Freq: Two times a day (BID) | ORAL | Status: DC
  Administered 2023-12-19 – 2023-12-21 (×10): 100 mg via ORAL
  Filled 2023-12-19 (×5): qty 1

## 2023-12-19 MED ORDER — POLYETHYLENE GLYCOL 3350 17 G PO PACK
17.0000 g | PACK | Freq: Every day | ORAL | Status: DC
  Administered 2023-12-19 – 2023-12-21 (×6): 17 g via ORAL
  Filled 2023-12-19 (×3): qty 1

## 2023-12-19 NOTE — Progress Notes (Signed)
 Physical Therapy Session Note  Patient Details  Name: Andrea Bridges MRN: [id number] Date of Birth: May 12, 1960  Today's Date: 12/19/2023 PT Individual Time: 1131-1156 PT Individual Time Calculation (min): 25 min   Short Term Goals: Week 1:  PT Short Term Goal 1 (Week 1): STGs = LTGs PT Short Term Goal 1 - Progress (Week 1): Progressing toward goal Week 2:  PT Short Term Goal 1 (Week 2): STG = LTG due to ELOS  Skilled Therapeutic Interventions/Progress Updates:   Received pt sitting EOB with daughter at bedside. Pt agreeable to PT treatment and denied any pain during session. Session with emphasis on functional mobility/transfers, generalized strengthening and endurance, dynamic standing balance/coordination, NMR, and ambulation. BP sitting at rest: 100/71 - pt asymptomatic. Donned abdominal binder and performed all transfers without AD and supervision throughout session. Pt ambulated 172ft without AD and supervision to main therapy gym. Worked on the following balance activities on [employer] with emphasis on dynamic standing balance/coordination, spatial awareness, and proprioception: -weight shift training on level static with BUE support for 1 minute -weight shift training on level 6 with BUE support fading to 1UE support for 1.5 minutes -limits of stability training on level static with BUE support for 1 minute -limits of stability training on level 6 with BUE support for 40 seconds x 2 and with 1UE support for 46 seconds Ambulated >292ft without AD and supervision back to room and concluded session with pt sitting EOB with all needs within reach.   Therapy Documentation Precautions:  Precautions Precautions: Fall Recall of Precautions/Restrictions: Impaired Precaution/Restrictions Comments: SBP <160 Restrictions Weight Bearing Restrictions Per Provider Order: No  Therapy/Group: Individual Therapy Therisa HERO Zaunegger Therisa Stains PT, DPT 12/19/2023, 6:54 AM

## 2023-12-19 NOTE — Progress Notes (Signed)
 Speech Language Pathology Daily Session Note  Patient Details  Name: Andrea Bridges MRN: [id number] Date of Birth: 06-02-1960  Today's Date: 12/19/2023 SLP Individual Time: 0930-1015 SLP Individual Time Calculation (min): 45 min  Short Term Goals: Week 2: SLP Short Term Goal 1 (Week 2): STGs = LTGs d/t ELOS  Skilled Therapeutic Interventions:   Pt greeted at bedside for tx targeting cognition. She was oriented and independently utilized her schedule to confirm details of the day thus far. SLP introduced and demonstrated [employer] games as potential tasks to target cognition in the home environment after mentioning them during family education. She was able to complete the complex tasks targeting attention, abstract thinking, organization, and working memory w/ only minA. She demonstrated good error awareness throughout as well. She was left in bed w/ the alarm set and call light within reach. Nursing present upon SLP departure as well. Recommend cont ST per POC.   Pain Pain Assessment Pain Scale: 0-10 Pain Score: 6  Pain Location: Head Pain Intervention(s): Pain med given for lower pain score than stated, per patient request  Therapy/Group: Individual Therapy  [name] 12/19/2023, 7:29 PM

## 2023-12-19 NOTE — Progress Notes (Signed)
 Speech Language Pathology Daily Session Note  Patient Details  Name: Andrea Bridges MRN: [id number] Date of Birth: 11-07-1960  Today's Date: 12/19/2023 SLP Individual Time: 8698-8648 SLP Individual Time Calculation (min): 50 min  Short Term Goals: Week 2: SLP Short Term Goal 1 (Week 2): STGs = LTGs d/t ELOS  Skilled Therapeutic Interventions:  Patient was seen in PM to address cognitive re- training. Pt was alert and seen at bedside upon SLP arrival. Both her dtr and former husband present initially however leaving shortly after session initiation. Pt verbalized her recent medical hx along with PLOF indep. She identified a general awareness of cognitive changes though demonstrated difficulty further expounding upon them. She further demonstrated emerging awareness through ability to refer to external aids for recall as well as identifying external aids as a compensatory strategy. Pt unable to identify any additional compensatory strategies. SLP reviewed WRAP compenstaory strategies and examples of utilization upon discharge. In other minutes of session, SLP initiated an executive function task through challenging pt to compare and contrast insurance policies as well as make recommendations based off of stimulus provided (Task chosen due to similarity of her job function). Pt warranting mod A including recommendations for scanning and comparing and contrast. At conclusion of session, pt was left upright in bed with call button within reach. SLP to continue POC.   Pain Pain Assessment Pain Scale: 0-10 Pain Score: 0-No pain  Therapy/Group: Individual Therapy  [name] 12/19/2023, 4:01 PM

## 2023-12-19 NOTE — Progress Notes (Signed)
 Physical Therapy Session Note  Patient Details  Name: Andrea Bridges MRN: [id number] Date of Birth: [birth year]  Today's Date: 12/19/2023 PT Individual Time: 9199-9158 PT Individual Time Calculation (min): 41 min   Short Term Goals: Week 2:  PT Short Term Goal 1 (Week 2): STG = LTG due to ELOS  Skilled Therapeutic Interventions/Progress Updates:      Pt supine in bed upon arrival. Pt agreeable to therapy. Pt denies any pain. Pt requesting medication for headache post stair navigation. Notified nurse. Pt demonstrates decreased symptoms with seated rest break.   Discussed any questions/concerns regarding discharge 8/13. Pt requesting to work on the stairs.   Pt ambulated to bathroom and performed ambulatory transfer to toilet with no AD and supervision. Pt attempted to urinate however unable. Pt expressed concerns about difficulty urinating last night and today. Nurse aware.   Donned ted hose and abdominal binder while sitting EOB with total A.   Pt denies any dizziness/lightheadedness throughout session.   Pt ambulated room to main gym with no AD and close supervision,with emphasis on dual tasking as pt scanned to R and L of environemnt to read room numbers and signs on the walls. Pt decreased speed, and demonstrating increasing crossover stepping with distractability however pt aware of it and able to self correct.   Pt navigated 12 6 inch steps with B HR and supervision with reciprocal gait. Pt endorses headache post stair navigation. Vitals assessed BP 109/60 HR 83. Pt requesting medication for headache. Notified nurse, nurse present to administer medication.   Pt ambulated main gym to room with no AD and supervision.   Pt seated in recliner at end of session with all needs within reach and chair alarm on.      Therapy Documentation Precautions:  Precautions Precautions: Fall Recall of Precautions/Restrictions: Impaired Precaution/Restrictions Comments: SBP  <160 Restrictions Weight Bearing Restrictions Per Provider Order: No   Therapy/Group: Individual Therapy  [facility] Andrea Bridges, [place], DPT  12/19/2023, 7:45 AM

## 2023-12-19 NOTE — Progress Notes (Signed)
 PROGRESS NOTE   Subjective/Complaints: No events overnight.  Still having some intermittent headaches, but getting slowly better and better. Vitals stable, hypotensive.  Denies symptoms.    12/19/2023    5:56 AM 12/18/2023    8:04 PM 12/18/2023    1:33 PM  Vitals with BMI  Systolic 90 120 97  Diastolic 58 104 71  Pulse 65 77 70     P.o. intakes appropriate  Continent of bladder MOSTLY - still needing occassional ISC, especially first thing in the morning. Last BM 8/8, large  ROS:  Pt denies chills, SOB, abd pain, CP, N/V/C/D, and vision changes + Headache--ongoing, intermittent + Urinary retention--improved  Objective:   No results found.  No results for input(s): WBC, HGB, HCT, PLT in the last 72 hours.  No results for input(s): NA, K, CL, CO2, GLUCOSE, BUN, CREATININE, CALCIUM in the last 72 hours.   Intake/Output Summary (Last 24 hours) at 12/19/2023 0832 Last data filed at 12/19/2023 0752 Gross per 24 hour  Intake --  Output 705 ml  Net -705 ml        Physical Exam: Vital Signs Blood pressure (!) 90/58, pulse 65, temperature 98.2 F (36.8 C), resp. rate 20, SpO2 97%.  General: awake, alert, relaxed, no acute distress.  Sitting up in bedside chair. HENT: conjugate gaze; oropharynx moist  bur hole site open to air.  No obvious drainage.  staples and sutures on EVD site-intact.  No apparent drainage over site.  Minimal swelling posterior to incision line, nonfluctuant, no expressible drainage.  CV: regular rate and rhythm; no JVD Pulmonary: CTA B/L; no W/R/R- good air movement GI: soft, NT, ND, (+)BS. + Lower abdominal tenderness with palpation. Psychiatric: appropriate mood and affect  Neuro:   Mental Status: Alert and oriented x4,  Following commands.  + Memory deficits, concentration difficulty  Fair insight into current deficits Moves all 4 extremities equally antigravity  against resistance, 5- out of 5 throughout Cranial nerves II through XII intact REFLEXES: DTR 2+ patella and Achilles bilaterally, no beats of clonus b/l ankles SENSORY: Normal to touch all 4 extremities    MSK: no joint swelling noted   Assessment/Plan: 1. Functional deficits which require 3+ hours per day of interdisciplinary therapy in a comprehensive inpatient rehab setting. Physiatrist is providing close team supervision and 24 hour management of active medical problems listed below. Physiatrist and rehab team continue to assess barriers to discharge/monitor patient progress toward functional and medical goals  Care Tool:  Bathing    Body parts bathed by patient: Right arm, Left arm, Chest, Abdomen, Front perineal area, Buttocks, Right upper leg, Left upper leg, Right lower leg, Left lower leg, Face         Bathing assist Assist Level: Contact Guard/Touching assist     Upper Body Dressing/Undressing Upper body dressing   What is the patient wearing?: Bra, Pull over shirt    Upper body assist Assist Level: Supervision/Verbal cueing    Lower Body Dressing/Undressing Lower body dressing      What is the patient wearing?: Underwear/pull up, Pants     Lower body assist Assist for lower body dressing: Contact Guard/Touching assist  Toileting Toileting    Toileting assist Assist for toileting: Contact Guard/Touching assist     Transfers Chair/bed transfer  Transfers assist     Chair/bed transfer assist level: Moderate Assistance - Patient 50 - 74%     Locomotion Ambulation   Ambulation assist      Assist level: Moderate Assistance - Patient 50 - 74% Assistive device: Hand held assist Max distance: 72   Walk 10 feet activity   Assist     Assist level: Moderate Assistance - Patient - 50 - 74% Assistive device: Hand held assist   Walk 50 feet activity   Assist    Assist level: Moderate Assistance - Patient - 50 - 74% Assistive device:  Hand held assist    Walk 150 feet activity   Assist Walk 150 feet activity did not occur: Safety/medical concerns         Walk 10 feet on uneven surface  activity   Assist Walk 10 feet on uneven surfaces activity did not occur: Safety/medical concerns         Wheelchair     Assist Is the patient using a wheelchair?: Yes Type of Wheelchair: Manual    Wheelchair assist level: Supervision/Verbal cueing Max wheelchair distance: 50    Wheelchair 50 feet with 2 turns activity    Assist        Assist Level: Supervision/Verbal cueing   Wheelchair 150 feet activity     Assist      Assist Level: Maximal Assistance - Patient 25 - 49%   Blood pressure (!) 90/58, pulse 65, temperature 98.2 F (36.8 C), resp. rate 20, SpO2 97%.  Medical Problem List and Plan: 1. Functional deficits secondary to nontraumatic subarachnoid hemorrhage with hydrocephalus status post EVD removed 7/27 and initial burr hole             -patient may shower, cover incision please             -ELOS/Goals: 10-14 days, PT/OT/SLP sup - 12/21/23            Con't CIR PT OT and SLP    8/5: Family can provide 4-6 hours assistance per day. SLP saying will need full supervision.  Progressing well with therapies.   2.  Antithrombotics: -DVT/anticoagulation:  Pharmaceutical: Lovenox              -antiplatelet therapy: N/A  3. Pain Management: Topamax  25 mg twice daily, Fioricet  as needed, oxycodone  5 mg every 4 hours as needed pain  8/2- needed Fioricet  and tylenol  for HA's today- con't regimen  8/3- HA a little more today- 5-6/10 but she reported this yesterday- will add Lidoderm  patches for low back/buttocks pain 8pm to 8am- explained canot add steroids secondary to NSU issues  8-5: Increase Topamax  to 50 mg twice daily.  Scheduled Tylenol  650 mg 3 times daily, reduce Fioricet  to 1 tablet every 6 hours as needed--patient reporting significant improvement with headaches since Topamax   increase  8-7: DC Topamax , possible contributor to ongoing urinary retention, headaches improving.  Continue with gabapentin .  8-8: Mild increase in headaches since medication adjustment, but tolerable with PRNs.  Continue  8/10 headaches overall controlled with current regimen, continue to monitor  4. Mood/Behavior/Sleep: Provide emotional support             -antipsychotic agents: N/A 5. Neuropsych/cognition: This patient is capable of making decisions on her own behalf.   - 8-11: Cognition has improved considerably; patient now has capacity  6. Skin/Wound Care: Routine  skin checks  8/2- although sutures are 3 weeks, will not remove until Ok'd by NSU due to concern about fluid drainage- is clear- not enough right now to test for CSF- d/w nursing as well as NICU nursing and NSU-also con't Staples from EVD site and monitor-  NSU will come see tomorrow during rounds  8/3- appears to be a CSF leak, base don  halo- NSU will see tomorrow- don't see note so far, going off nursing report. See below for more in depth  8-4: Neurosurgery, Dr. Lanis saw yesterday, advised no changes given no signs of infection or CSF headache.  Will come back by today.  8-5: Surgical site looks good, no further drainage reported--per Dr. Lanis, if site remains stable, can remove staples and sutures 8-11  8/11 remove sutures, staples today.  DC IV.  7. Fluids/Electrolytes/Nutrition: Routine in and outs with follow-up chemistries--stable  - 8-4: P.o. intakes poor, noted started on Remeron  over the weekend.  Will monitor for now.  8-5: Eating 50 to 100% of meals; significantly improved.  - Monitor p.o. intakes off of mirtazapine , doing well so far  8.  ?  Seizure.  Keppra  500 mg twice daily.   - Keppra  was initiated 8-1 for possible seizure, negative on EEG.  Will need to continue medication until neurosurgical follow-up  9.  Fevers/E. coli UTI/urinary retention/neurogenic bladder.  7-day course Ancef  completed.   Blood cultures/CSF no growth to date and placed on Bactrim  DS 1 tablet every 12 hours x 10 doses 12/08/2023 for possible UTI.  Currently on airborne and contact precautions pending COVID/RVP results. - 8/2- off precautions   Continue Flomax  0.8 mg daily, Urecholine  10 mg 3 times daily.   - Consider voiding trial in 2 to 3 days  8/2- will leave foley until Sunday/Monday- pt overwhelmed by the idea of in/out cath yet- will let her get used to CIR before removing- x1 day  8/3- foley removed- ordered to bladder scan q6 hours and cath if volmes >350cc- with lidocaine - went over with husband and pt- she's agreeable.   - 8-4: Requiring intermittent straight caths, bethanechol  stopped due to hypotension; continue monitoring today  8-5: Move Flomax  to nightly for hypotension, reduced to 0.4 mg  8/ 6: Had a few continent voids overnight, still requiring intermittent ISC.  Start timed toileting every 4-6 hours, with orders for double voiding on attempts.  Continue Flomax  at current dose due to ongoing hypotension.  8-7: Add baclofen 10 mg 3 times daily, increase Flomax  to 0.8 mg nightly.  Adding a.m. midodrine  to balance out.  DC Topamax , mirtazapine .  Getting renal ultrasound today for further workup.  Urinalysis negative.  Is having regular, daily bowel movements, so doubt contribution of constipation.  8-8: Renal ultrasound unremarkable.  Urinalysis negative.  Blood pressure holding up with current dose of Flomax , Urecholine .  Will give 24 hours for mirtazapine  to washout--patient aware that, if no improvement incontinent voids by tomorrow morning, will recommend replacement of Foley.  8/9 required intermittent cath today but yesterday appears was voiding better, hold off on Foley for now  8/10 bladder function appears to be doing better, continue to monitor bladder scans and PVRs for now--continue  8-11: Needing straight caths about once daily, usually first thing in the morning.  Limited on medication  adjustments due to hypotension.  Continue to encourage patient up to toilet    10.  Incidental finding of 7 mm nodular density in the medial right lung base.  Follow-up outpatient.  11.  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  Sodium 131-133.  Serum osmolality 289 urine osmolality 740 urine sodium 80.  Continue salt tablets 2 g 3 times daily.  Avoid hypotonic fluids.  8/2- Na better 134--normalized 8-4  8 /6: Reduce salt tabs to 1 g 3 times daily   8-7: BMP stable, reduce salt tabs 2 times daily.  8-8: BMP stable 8-11: DC salt tabs--resolved  12.  Vasospasm Prevention.  Nimodipine  60 mg every 4 hours. NSGY note indicates for 21 days (end date 8/3)  13.  Tobacco abuse.  NicoDerm patch.  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 Prior to this, husband reports was planning on  stopping vaping, so don't want to encourage that.  8-4: No apparent picking today.  14.  Overweight/Class I obesity.  Last BMI 29.80.  Dietary follow-up 15. Hypokalemia  8/2- K+ 3.4- will replete with 40 Meq x1.   Normalized 8-4  16.  Hypotension  8/2- will con't Nimodipine  however will add midodrine  2.5 mg TID as needed for soft BP- will also order TEDs and ACE wraps if needed.  8/3- BP still soft- like running 90's systolic- will increase midodrine  to 5 mg TID prn- for SBP <100- shouldn't need once Nimodipine  is finished, hopefully  8-5: Abdominal binder, teds, move Flomax  to nightly as above.  Ordered 1 L normal saline at 100 cc/h over the course of today for additional support.  Labs in AM.  8-6: Labs stable, BPs remain soft but less symptomatic orthostasis.  Reducing salt tabs as above, monitor  8-7: DC mirtazapine , Topamax .  Adding midodrine  5 mg twice daily while adjusting bladder regimen as below.  orthostats ordered--doing okay  8/10 Increase midodrine  to 5mg  TID--stable symptomatically on this  regimen     12/19/2023    5:56 AM 12/18/2023    8:04 PM 12/18/2023    1:33 PM  Vitals with BMI  Systolic 90 120 97  Diastolic 58 104 71  Pulse 65 77 70     17. Insomnia/restless at night  8/3- added Remeron  since bladder issues, cannot do Trazadone etc, and has decreased appetite.   8-4: Sleep improved last night.  Monitor.  8-7: DC Remeron  due to urinary retention.  Add as needed melatonin 5 mg, trazodone  50 mg second line  - 8-11: No further endorse sleep issues  18.  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  - Lumbar x-rays ordered for initial workup IMPRESSION: 1. Mild degenerative disc disease at L2-L3, lesser at L4-L5. 2. Facet hypertrophy at L3-L4, L4-L5 and L5-S1.   - Start gabapentin  100 mg 3 times daily; stay low dose due to ongoing orthostasis  - 8-7: Symptoms likely secondary to lumbosacral radiculopathy versus sciatica given significant lumbar arthritis as above; recommend EMG as outpatient  - No complaints today 8-8  - Remains improved 8/10  LOS: 10 days A FACE TO FACE EVALUATION WAS PERFORMED  Joesph JAYSON Likes 12/19/2023, 8:32 AM

## 2023-12-19 NOTE — Progress Notes (Signed)
 Staples and sutures removed per MD order. Pt tolerated well. No complaints of pain, no signs of redness or drainage.All other needs met at this time.

## 2023-12-19 NOTE — Progress Notes (Signed)
 Occupational Therapy Session Note  Patient Details  Name: Kenyada Dosch MRN: [id number] Date of Birth: [birth year]  Today's Date: 12/19/2023 OT Individual Time: 1020-1100 OT Individual Time Calculation (min): 40 min    Short Term Goals: Week 2:  OT Short Term Goal 1 (Week 2): STGs=LTGs due to patient's estimated length of stay.  Skilled Therapeutic Interventions/Progress Updates:   Pt greeted resting in bed, conversating with LPN, skilled OT session with focus on functional mobility and cognitive retraining. In day room, pt instructed in STM task using fictional map and accompanying questions. Pt requires Min A for recall of questions, once looking a map, post 40-50 ft of ambulation in busy unit space. Min cues for attention to R-side of environment during mobility.   Pt remained resting in bed with 4Ps assessed and immediate needs met. Pt continues to be appropriate for skilled OT intervention to promote further functional independence in ADLs/IADLs.   Therapy Documentation Precautions:  Precautions Precautions: Fall Recall of Precautions/Restrictions: Impaired Precaution/Restrictions Comments: SBP <160 Restrictions Weight Bearing Restrictions Per Provider Order: No   Therapy/Group: Individual Therapy  [name], OTR/L, MSOT  12/19/2023, 7:27 AM

## 2023-12-20 ENCOUNTER — Other Ambulatory Visit (HOSPITAL_COMMUNITY): Payer: Self-pay

## 2023-12-20 LAB — BASIC METABOLIC PANEL WITH GFR
Anion gap: 11 (ref 5–15)
BUN: 13 mg/dL (ref 8–23)
CO2: 25 mmol/L (ref 22–32)
Calcium: 9.5 mg/dL (ref 8.9–10.3)
Chloride: 101 mmol/L (ref 98–111)
Creatinine, Ser: 0.67 mg/dL (ref 0.44–1.00)
GFR, Estimated: >60 mL/min (ref >60)
Glucose, Bld: 96 mg/dL (ref 70–99)
Potassium: 3.8 mmol/L (ref 3.5–5.1)
Sodium: 137 mmol/L (ref 135–145)

## 2023-12-20 MED ORDER — DOCUSATE SODIUM 100 MG PO CAPS: 100.0000 mg | ORAL_CAPSULE | Freq: Two times a day (BID) | ORAL | Status: DC

## 2023-12-20 MED ORDER — OXYCODONE HCL 5 MG PO TABS
5.0000 mg | ORAL_TABLET | ORAL | 0 refills | Status: DC | PRN
  Filled 2023-12-20: qty 30, 5d supply, fill #0

## 2023-12-20 MED ORDER — POLYETHYLENE GLYCOL 3350 17 G PO PACK: 17.0000 g | PACK | Freq: Every day | ORAL | Status: DC

## 2023-12-20 MED ORDER — MIDODRINE HCL 5 MG PO TABS
5.0000 mg | ORAL_TABLET | Freq: Three times a day (TID) | ORAL | 0 refills | Status: DC
  Filled 2023-12-20: qty 90, 30d supply, fill #0

## 2023-12-20 MED ORDER — BUTALBITAL-APAP-CAFFEINE 50-325-40 MG PO TABS
1.0000 | ORAL_TABLET | Freq: Four times a day (QID) | ORAL | 0 refills | Status: DC | PRN
  Filled 2023-12-20: qty 14, 4d supply, fill #0

## 2023-12-20 MED ORDER — NICOTINE 14 MG/24HR TD PT24: MEDICATED_PATCH | TRANSDERMAL | Status: AC

## 2023-12-20 MED ORDER — LIDOCAINE 5 % EX PTCH
2.0000 | MEDICATED_PATCH | CUTANEOUS | 0 refills | Status: DC
  Filled 2023-12-20: qty 30, 15d supply, fill #0

## 2023-12-20 MED ORDER — TAMSULOSIN HCL 0.4 MG PO CAPS
0.8000 mg | ORAL_CAPSULE | Freq: Every day | ORAL | 0 refills | Status: DC
  Filled 2023-12-20: qty 60, 30d supply, fill #0

## 2023-12-20 MED ORDER — LEVETIRACETAM 500 MG PO TABS
500.0000 mg | ORAL_TABLET | Freq: Two times a day (BID) | ORAL | 0 refills | Status: DC
  Filled 2023-12-20: qty 60, 30d supply, fill #0

## 2023-12-20 MED ORDER — MELATONIN 5 MG PO TABS
5.0000 mg | ORAL_TABLET | Freq: Every evening | ORAL | 0 refills | Status: AC | PRN
  Filled 2023-12-20: qty 30, 30d supply, fill #0

## 2023-12-20 MED ORDER — PANTOPRAZOLE SODIUM 40 MG PO TBEC
40.0000 mg | DELAYED_RELEASE_TABLET | Freq: Every day | ORAL | 0 refills | Status: DC
  Filled 2023-12-20: qty 30, 30d supply, fill #0

## 2023-12-20 NOTE — Progress Notes (Signed)
 Inpatient Rehabilitation Discharge Medication Review by a Pharmacist  A complete drug regimen review was completed for this patient to identify any potential clinically significant medication issues.  High Risk Drug Classes Is patient taking? Indication by Medication  Antipsychotic No   Anticoagulant No   Antibiotic No   Opioid Yes Oxycodone : pain  Antiplatelet No   Hypoglycemics/insulin  No   Vasoactive Medication Yes Midodrine - hypotension Flomax : urinary retention  Chemotherapy No   Other Yes Keppra : seizure ppx Nicotine : nicotine  dependence Protonix : GERD Tylenol , lidocaine  patch: pain Fioricet : headache Docusate,miralax : bowel regimen/constipation Melatonin - sleep Vitamin D  -supplement     Type of Medication Issue Identified Description of Issue Recommendation(s)  Drug Interaction(s) (clinically significant)     Duplicate Therapy     Allergy     No Medication Administration End Date     Incorrect Dose     Additional Drug Therapy Needed     Significant med changes from prior encounter (inform family/care partners about these prior to discharge). All current medications are new medications.   PTA meds: Wegovy ,  ibuprofen  were discontinued on [facility]  discharge 12/09/23. Discontinued on discharge from CIR 12/21/23.   Topiramate , nimodipine , bethanachol , nystatin  suspension ffrom [facility] acute care encounter were discontinued on CIR encounter/discharge.  Communicate medication changes with patient/family at discharge.  Other       Clinically significant medication issues were identified that warrant physician communication and completion of prescribed/recommended actions by midnight of the next day:  No  Pharmacist comments:  Time spent performing this drug regimen review (minutes):   Levorn Gaskins, RPh Clinical Pharmacist 12/21/23 9:04 AM

## 2023-12-20 NOTE — Progress Notes (Signed)
 PROGRESS NOTE   Subjective/Complaints: No events overnight.  No acute complaints.  Headache well-controlled. Sutures and staples removed yesterday; States that her head feels much better after that. He is having worsening of her sciatica bilaterally, which was pre-existing hospitalization but has worsened since.  We discussed outpatient follow-up and possible MRI for this, which she is in agreement with.  Has not needed straight cath in over 24 hours Blood pressure remains soft, but stable.  ROS:  Pt denies chills, SOB, abd pain, CP, N/V/C/D, and vision changes + Headache--ongoing, intermittent + Urinary retention--improved + Bilateral neuropathic pain-ongoing  Objective:   No results found.  No results for input(s): WBC, HGB, HCT, PLT in the last 72 hours.  No results for input(s): NA, K, CL, CO2, GLUCOSE, BUN, CREATININE, CALCIUM in the last 72 hours.   Intake/Output Summary (Last 24 hours) at 12/20/2023 1033 Last data filed at 12/20/2023 0800 Gross per 24 hour  Intake 1200 ml  Output 650 ml  Net 550 ml        Physical Exam: Vital Signs Blood pressure (!) 92/55, pulse 75, temperature 97.9 F (36.6 C), temperature source Oral, resp. rate 18, SpO2 100%.  General: awake, alert, laying in bed.  No acute distress. HENT: conjugate gaze; oropharynx moist. PERRLA, EOMI.  Minimal swelling posterior to incision line, nonfluctuant, no  drainage.   CV: regular rate and rhythm; no JVD Pulmonary: CTA B/L; no W/R/R- good air movement GI: soft, NT, ND, (+)BS.   Psychiatric: appropriate mood and affect  Neuro:   Mental Status: Alert and oriented x4,  Following commands.  No apparent deficits. Good insight into current deficits Moves all 4 extremities equally antigravity against resistance, 5 out of 5 throughout Cranial nerves II through XII intact REFLEXES: DTR 2+ patella and Achilles bilaterally, no  beats of clonus b/l ankles SENSORY: Normal to touch all 4 extremities  MSK: no joint swelling noted   Assessment/Plan: 1. Functional deficits which require 3+ hours per day of interdisciplinary therapy in a comprehensive inpatient rehab setting. Physiatrist is providing close team supervision and 24 hour management of active medical problems listed below. Physiatrist and rehab team continue to assess barriers to discharge/monitor patient progress toward functional and medical goals  Care Tool:  Bathing    Body parts bathed by patient: Right arm, Left arm, Chest, Abdomen, Front perineal area, Buttocks, Right upper leg, Left upper leg, Right lower leg, Left lower leg, Face         Bathing assist Assist Level: Contact Guard/Touching assist     Upper Body Dressing/Undressing Upper body dressing   What is the patient wearing?: Bra, Pull over shirt    Upper body assist Assist Level: Supervision/Verbal cueing    Lower Body Dressing/Undressing Lower body dressing      What is the patient wearing?: Underwear/pull up, Pants     Lower body assist Assist for lower body dressing: Contact Guard/Touching assist     Toileting Toileting    Toileting assist Assist for toileting: Contact Guard/Touching assist     Transfers Chair/bed transfer  Transfers assist     Chair/bed transfer assist level: Moderate Assistance - Patient 50 - 74%  Locomotion Ambulation   Ambulation assist      Assist level: Moderate Assistance - Patient 50 - 74% Assistive device: Hand held assist Max distance: 72   Walk 10 feet activity   Assist     Assist level: Moderate Assistance - Patient - 50 - 74% Assistive device: Hand held assist   Walk 50 feet activity   Assist    Assist level: Moderate Assistance - Patient - 50 - 74% Assistive device: Hand held assist    Walk 150 feet activity   Assist Walk 150 feet activity did not occur: Safety/medical concerns         Walk  10 feet on uneven surface  activity   Assist Walk 10 feet on uneven surfaces activity did not occur: Safety/medical concerns         Wheelchair     Assist Is the patient using a wheelchair?: Yes Type of Wheelchair: Manual    Wheelchair assist level: Supervision/Verbal cueing Max wheelchair distance: 50    Wheelchair 50 feet with 2 turns activity    Assist        Assist Level: Supervision/Verbal cueing   Wheelchair 150 feet activity     Assist      Assist Level: Maximal Assistance - Patient 25 - 49%   Blood pressure (!) 92/55, pulse 75, temperature 97.9 F (36.6 C), temperature source Oral, resp. rate 18, SpO2 100%.  Medical Problem List and Plan: 1. Functional deficits secondary to nontraumatic subarachnoid hemorrhage with hydrocephalus status post EVD removed 7/27 and initial burr hole             -patient may shower, cover incision please             -ELOS/Goals: 10-14 days, PT/OT/SLP sup - 12/21/23            Con't CIR PT OT and SLP    8/5: Family can provide 4-6 hours assistance per day. SLP saying will need full supervision.  Progressing well with therapies.   8/12: Ready for DC at goal level; going to [place]. discussed follow-ups with patient.  The patient is medically ready for discharge to home and will not need follow-up with [facility] PM[facility]. In addition, they will need to follow up with their PCP, Urology, Psychiatry and Neurosurgery.   2.  Antithrombotics: -DVT/anticoagulation:  Pharmaceutical: Lovenox              -antiplatelet therapy: N/A  3. Pain Management: Topamax  25 mg twice daily, Fioricet  as needed, oxycodone  5 mg every 4 hours as needed pain  8/2- needed Fioricet  and tylenol  for HA's today- con't regimen  8/3- HA a little more today- 5-6/10 but she reported this yesterday- will add Lidoderm  patches for low back/buttocks pain 8pm to 8am- explained canot add steroids secondary to NSU issues  8-5: Increase Topamax  to 50 mg twice daily.  Scheduled  Tylenol  650 mg 3 times daily, reduce Fioricet  to 1 tablet every 6 hours as needed--patient reporting significant improvement with headaches since Topamax  increase  8-7: DC Topamax , possible contributor to ongoing urinary retention, headaches improving.  Continue with gabapentin .  8-8: Mild increase in headaches since medication adjustment, but tolerable with PRNs.  Continue  8/10 headaches overall controlled with current regimen, continue to monitor  4. Mood/Behavior/Sleep: Provide emotional support             -antipsychotic agents: N/A   - 8/12: Patient asking for mental health services in [place] --  will need to use insurance to  find a provider  5. Neuropsych/cognition: This patient is capable of making decisions on her own behalf.   - 8-11: Cognition has improved considerably; patient now has capacity  6. Skin/Wound Care: Routine skin checks  8/2- although sutures are 3 weeks, will not remove until Ok'd by NSU due to concern about fluid drainage- is clear- not enough right now to test for CSF- d/w nursing as well as NICU nursing and NSU-also con't Staples from EVD site and monitor-  NSU will come see tomorrow during rounds  8/3- appears to be a CSF leak, base don  halo- NSU will see tomorrow- don't see note so far, going off nursing report. See below for more in depth  8-4: Neurosurgery, Dr. Lanis saw yesterday, advised no changes given no signs of infection or CSF headache.  Will come back by today.  8-5: Surgical site looks good, no further drainage reported--per Dr. Lanis, if site remains stable, can remove staples and sutures 8-11  8/11 remove sutures, staples today.  DC IV.--No drainage, well-approximated, appears stable-follow-up  7. Fluids/Electrolytes/Nutrition: Routine in and outs with follow-up chemistries--stable  - 8-4: P.o. intakes poor, noted started on Remeron  over the weekend.  Will monitor for now.  8-5: Eating 50 to 100% of meals; significantly improved.  - Monitor  p.o. intakes off of mirtazapine , doing well so far  8.  ?  Seizure.  Keppra  500 mg twice daily.   - Keppra  was initiated 8-1 for possible seizure, negative on EEG.  Will need to continue medication until neurosurgical follow-up  9.  Fevers/E. coli UTI/urinary retention/neurogenic bladder.  7-day course Ancef  completed.  Blood cultures/CSF no growth to date and placed on Bactrim  DS 1 tablet every 12 hours x 10 doses 12/08/2023 for possible UTI.  Currently on airborne and contact precautions pending COVID/RVP results. - 8/2- off precautions   Continue Flomax  0.8 mg daily, Urecholine  10 mg 3 times daily.   - Consider voiding trial in 2 to 3 days  8/2- will leave foley until Sunday/Monday- pt overwhelmed by the idea of in/out cath yet- will let her get used to CIR before removing- x1 day  8/3- foley removed- ordered to bladder scan q6 hours and cath if volmes >350cc- with lidocaine - went over with husband and pt- she's agreeable.   - 8-4: Requiring intermittent straight caths, bethanechol  stopped due to hypotension; continue monitoring today  8-5: Move Flomax  to nightly for hypotension, reduced to 0.4 mg  8/ 6: Had a few continent voids overnight, still requiring intermittent ISC.  Start timed toileting every 4-6 hours, with orders for double voiding on attempts.  Continue Flomax  at current dose due to ongoing hypotension.  8-7: Add baclofen 10 mg 3 times daily, increase Flomax  to 0.8 mg nightly.  Adding a.m. midodrine  to balance out.  DC Topamax , mirtazapine .  Getting renal ultrasound today for further workup.  Urinalysis negative.  Is having regular, daily bowel movements, so doubt contribution of constipation.  8-8: Renal ultrasound unremarkable.  Urinalysis negative.  Blood pressure holding up with current dose of Flomax , Urecholine .  Will give 24 hours for mirtazapine  to washout--patient aware that, if no improvement incontinent voids by tomorrow morning, will recommend replacement of Foley.  8/9  required intermittent cath today but yesterday appears was voiding better, hold off on Foley for now  8/10 bladder function appears to be doing better, continue to monitor bladder scans and PVRs for now--continue  8-11: Needing straight caths about once daily, usually first thing in the morning.  Limited on medication adjustments due to hypotension.  Continue to encourage patient up to toilet  8-12: No straight caths in the last 24 hours.  Will trial off of the bed today, strict timed bladder scans; can resume [place] follow-up at discharge if needed.  A, will need to establish with urology as outpatient.    10.  Incidental finding of 7 mm nodular density in the medial right lung base.  Follow-up outpatient.  11.  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  Sodium 131-133.  Serum osmolality 289 urine osmolality 740 urine sodium 80.  Continue salt tablets 2 g 3 times daily.  Avoid hypotonic fluids.  8/2- Na better 134--normalized 8-4  8 /6: Reduce salt tabs to 1 g 3 times daily   8-7: BMP stable, reduce salt tabs 2 times daily.  8-8: BMP stable 8-11: DC salt tabs--resolved 8-12: Labs today  prior to discharge  12.  Vasospasm Prevention.  Nimodipine  60 mg every 4 hours. NSGY note indicates for 21 days (end date 8/3)  13.  Tobacco abuse.  NicoDerm patch.  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 Prior to this, husband reports was planning on  stopping vaping, so don't want to encourage that.  8-4: No apparent picking today.  14.  Overweight/Class I obesity.  Last BMI 29.80.  Dietary follow-up 15. Hypokalemia  8/2- K+ 3.4- will replete with 40 Meq x1.   Normalized 8-4  16.  Hypotension  8/2- will con't Nimodipine  however will add midodrine  2.5 mg TID as needed for soft BP- will also order TEDs and ACE wraps if needed.  8/3- BP still soft- like running 90's systolic- will increase  midodrine  to 5 mg TID prn- for SBP <100- shouldn't need once Nimodipine  is finished, hopefully  8-5: Abdominal binder, teds, move Flomax  to nightly as above.  Ordered 1 L normal saline at 100 cc/h over the course of today for additional support.  Labs in AM.  8-6: Labs stable, BPs remain soft but less symptomatic orthostasis.  Reducing salt tabs as above, monitor  8-7: DC mirtazapine , Topamax .  Adding midodrine  5 mg twice daily while adjusting bladder regimen as below.  orthostats ordered--doing okay  8/10 Increase midodrine  to 5mg  TID--stable symptomatically on this regimen--discharged with     12/20/2023    3:03 AM 12/19/2023    7:51 PM 12/19/2023    2:51 PM  Vitals with BMI  Systolic 92 94 93  Diastolic 55 58 61  Pulse 75 77 73     17. Insomnia/restless at night  8/3- added Remeron  since bladder issues, cannot do Trazadone etc, and has decreased appetite.   8-4: Sleep improved last night.  Monitor.  8-7: DC Remeron  due to urinary retention.  Add as needed melatonin 5 mg, trazodone  50 mg second line  - 8-11: No further endorse sleep issues  18.  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  - Lumbar x-rays ordered for initial workup IMPRESSION: 1. Mild degenerative disc disease at L2-L3, lesser at L4-L5. 2. Facet hypertrophy at L3-L4, L4-L5 and L5-S1.   - Start gabapentin  100 mg 3 times daily; stay low dose due to ongoing orthostasis  - 8-7: Symptoms likely secondary to lumbosacral radiculopathy versus sciatica given significant lumbar arthritis as above; recommend EMG/MRI as outpatient  - No complaints today 8-8  - 8-12: Symptoms significantly increased since stopping gabapentin , but are manageable  with repositioning.  Outpatient follow-up appropriate.  LOS: 11 days A FACE TO FACE EVALUATION WAS PERFORMED  Joesph JAYSON Likes 12/20/2023, 10:33 AM

## 2023-12-20 NOTE — Progress Notes (Signed)
 Patient ID: Anaisa Radi, female   DOB: 1961-04-10, 63 y.o.   MRN: [id number]  SW met with pt during OT session to provide updates from team conference, and d/c date remains tomorrow. Prefers for SW to explore outpatient in the following locations- [place], [place], [place], and [place]. SW informed will confirm outpatient location that offers all three therapies.   1524- SW left message for pt dtr on above about finding an outpatient location and will call back with updates.   SW confirmed with pt referral will be sent to [facility] (p:[phone]- [phone]/f: [phone]) as PA is gone for the day and referral will be sent tomorrow morning.   1558- SW left message for pt dtr [name] informing on above, and to call if there are any issues in the future.   Discharge instructions updated.   Graeme Jude, MSW, LCSW Office: [phone] Cell: [phone] Fax: [phone]

## 2023-12-20 NOTE — Patient Care Conference (Signed)
 Inpatient RehabilitationTeam Conference and Plan of Care Update Date: 12/20/2023   Time: 1033 am    [name]      Medical Record Number: [id number]  Date of Birth: [birth year] Sex: Female         Room/Bed: 4W10C/4W10C-01 Payor Info: Payor: [employer] / Plan: [employer] / Product Type: *No Product type* /    Admit Date/Time:  12/09/2023  6:43 PM  Primary Diagnosis:  Nontraumatic subcortical hemorrhage of left cerebral hemisphere [facility])  Hospital Problems: Principal Problem:   Nontraumatic subcortical hemorrhage of left cerebral hemisphere [facility]) Active Problems:   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 Joesph Likes Social Worker Present: Graeme Jude, LCSW Nurse Present: Eulalio Falls, RN PT Present: Catilin Osborn, PT OT Present: [name], OT SLP Present: Recardo Mole, SLP     Current Status/Progress Goal Weekly Team Focus  Bowel/Bladder   pt continent of b/b, voiding and emptying   Maintain continence   Assist with toileting needs and pvr    Swallow/Nutrition/ Hydration               ADL's   Goal level. Strong emphasis placed on daily rountine establishment as transitioning home.   Supervision   Discharge    Mobility   overall supervision, ambulating >500' without device, stairs supervision   supervision ambulatory  ready for DC    Communication                Safety/Cognition/ Behavioral Observations  supervision for simple and mildly complex cognitive tasks, minA for very complex tasks - met all LTGs   minA   pt/family education, cognitive re training    Pain   C/o headache. 3/10. prn fioricet  given   Pain score 0   Assess qshift and prn    Skin   Skin intact  8/11: sutures/ staples removed Maintain skin integrity  Assess qshift and prn      Discharge Planning:   Pt will d/c to her brother's home in [place]. Fam edu completed on Friday with her brother, his wife  and their dtr. SW will confirm there are no barriers to discharge.    Team Discussion: Patient was admitted post nontraumatic subarachnoid hemorrhage with hydrocephalus. Patient's headaches well controlled. Patient with hypotension, sciatica pain: medication adjusted by MD.    Patient on target to meet rehab goals: yes, currently patient at goal level with ADLs and ambulating up to 499' without device. Patient needs supervision with simple and mildly complex cognitive tasks. Overall goals at discharge are set for supervision assistance.   *See Care Plan and progress notes for long and short-term goals.   Revisions to Treatment Plan:  N/a    Teaching Needs: Safety, medications, toileting, transfers, dietary modifications, incision care, etc.    Current Barriers to Discharge: Decreased caregiver support and Home enviroment access/layout, Urinary retention  Possible Resolutions to Barriers: Family Education Outpatient follow up      Medical Summary Current Status: Medically complicated by cognitive deficits, hypotension, headache, urinary retention, hyponatremia and poor sleep  Barriers to Discharge: Behavior/Mood;Hypotension;Inadequate Nutritional Intake;Self-care education;Uncontrolled Pain;Medical stability;Incontinence;Neurogenic Bowel & Bladder;Complicated Wound   Possible Resolutions to [employer] Focus: fluids and medicaiton adjustments for BP, timed toiletting and meidcations for urinary retention, HA m,edications   Continued Need for Acute Rehabilitation Level of Care: The patient requires daily medical management by a physician with specialized training in physical  medicine and rehabilitation for the following reasons: Direction of a multidisciplinary physical rehabilitation program to maximize functional independence : Yes Medical management of patient stability for  increased activity during participation in an intensive rehabilitation regime.: Yes Analysis of laboratory values and/or radiology reports with any subsequent need for medication adjustment and/or medical intervention. : Yes   I attest that I was present, lead the team conference, and concur with the assessment and plan of the team.   Cary Wilford Gayo 12/20/2023, 1033 am

## 2023-12-20 NOTE — Progress Notes (Signed)
 Speech Language Pathology Daily Session Note  Patient Details  Name: Andrea Bridges MRN: [id number] Date of Birth: 1960/08/29  Today's Date: 12/20/2023 SLP Individual Time: 0800-0900 SLP Individual Time Calculation (min): 60 min  Short Term Goals: Week 2: SLP Short Term Goal 1 (Week 2): STGs = LTGs d/t ELOS  Skilled Therapeutic Interventions:  Pt greeted at bedside for tx targeting cognition. She was pleasant and cooperative throughout tx tasks. She was Aox4 and able to recall details of the last 24 hours w/ s cues. She demonstrates improved recall of staff names and details as well. She was able to complete money management task targeting organization and attention to detail @ modI. She also independently completed a mildly complex decoding task targeting alternating attention, information processing, and problem solving. Improved cognitive endurance and processing speed noted as compared to prev tx sessions. She demonstrated adequate reasoning and required only minA cues for planning during final conversation re discharge education and cognitive apps to complete at home. She was left in bed w/ the call light within reach. See d/c summary for more info.   Pain Pain Assessment Pain Scale: 0-10 Pain Score: 3  Pain Location: Head Pain Orientation: Right Pain Descriptors / Indicators: Headache Pain Onset: On-going  Therapy/Group: Individual Therapy  Andrea Bridges 12/20/2023, 9:15 AM

## 2023-12-20 NOTE — Plan of Care (Signed)
 ?  Problem: RH Cognition - SLP ?Goal: RH LTG Patient will demonstrate orientation with cues ?Description:  LTG:  Patient will demonstrate orientation to person/place/time/situation with cues (SLP)   ?Outcome: Completed/Met ?  ?Problem: RH Problem Solving ?Goal: LTG Patient will demonstrate problem solving for (SLP) ?Description: LTG:  Patient will demonstrate problem solving for basic/complex daily situations with cues  (SLP) ?Outcome: Completed/Met ?  ?Problem: RH Memory ?Goal: LTG Patient will use memory compensatory aids to (SLP) ?Description: LTG:  Patient will use memory compensatory aids to recall biographical/new, daily complex information with cues (SLP) ?Outcome: Completed/Met ?  ?Problem: RH Attention ?Goal: LTG Patient will demonstrate this level of attention during functional activites (SLP) ?Description: LTG:  Patient will will demonstrate this level of attention during functional activites (SLP) ?Outcome: Completed/Met ?  ?Problem: RH Awareness ?Goal: LTG: Patient will demonstrate awareness during functional activites type of (SLP) ?Description: LTG: Patient will demonstrate awareness during functional activites type of (SLP) ?Outcome: Completed/Met ?  ?

## 2023-12-20 NOTE — Progress Notes (Signed)
 Speech Language Pathology Discharge Summary  Patient Details  Name: Andrea Bridges MRN: [id number] Date of Birth: 08/10/1960  Date of Discharge from SLP service:December 20, 2023  Patient has met 5 of 5 long term goals.  Patient to discharge at overall Supervision level.  Reasons goals not met: n/a   Clinical Impression/Discharge Summary:  Pt has made excellent progress this stay, as evidenced by mastery of 5/5 LTGs. She demonstrates improved orientation, STM, problem solving, awareness, attention, and cognitive endurance. She initially required mod-maxA and demonstrated minimal to no awareness. However, she now completes tasks of increasing complexity w/ supervision assist. Additionally, she demonstrates emerging success w/ emergent and anticipatory awareness. She is set to d/c home w/ supervision available. Pt/family education complete. Recommend cont ST upon d/c to target remaining mild cognitive deficits, maximize pt independence, and facilitate return to prev roles/responsibilities.    Care Partner:  Caregiver Able to Provide Assistance: Yes  Type of Caregiver Assistance: Cognitive;Physical  Recommendation:  Outpatient SLP  Rationale for SLP Follow Up: Maximize cognitive function and independence;Reduce caregiver burden   Equipment: n/a   Reasons for discharge: Discharged from hospital   Patient/Family Agrees with Progress Made and Goals Achieved: Yes    [name] 12/20/2023, 8:38 AM

## 2023-12-20 NOTE — Progress Notes (Signed)
 Occupational Therapy Discharge Summary  Patient Details  Name: Andrea Bridges MRN: [id number] Date of Birth: Sep 22, 1960  Date of Discharge from OT service:December 20, 2023  Today's Date: 12/20/2023 OT Individual Time: 8694-8653 OT Individual Time Calculation (min): 41 min    Patient has met 8 of 8 long term goals due to improved activity tolerance, improved balance, ability to compensate for deficits, improved attention, and improved awareness.  Patient to discharge at overall Supervision level.  Patient's care partner is independent to provide the necessary cognitive assistance at discharge.    Reasons goals not met: N/A  Recommendation:  Patient will benefit from ongoing skilled OT services in outpatient setting to continue to advance functional skills in the area of BADL, iADL, and Vocation.  Equipment: Shower chair/TTB already owned  Reasons for discharge: treatment goals met and discharge from hospital  Patient/family agrees with progress made and goals achieved: Yes  OT Discharge Precautions/Restrictions  Precautions Precautions: Fall Recall of Precautions/Restrictions: Intact Restrictions Weight Bearing Restrictions Per Provider Order: No ADL ADL Eating: Independent Where Assessed-Eating: Edge of bed Grooming: Modified independent Where Assessed-Grooming: Standing at sink, Sitting at sink Upper Body Bathing: Modified independent Where Assessed-Upper Body Bathing: Shower Lower Body Bathing: Supervision/safety Where Assessed-Lower Body Bathing: Shower Upper Body Dressing: Modified independent (Device) Where Assessed-Upper Body Dressing: Edge of bed Lower Body Dressing: Supervision/safety Where Assessed-Lower Body Dressing: Edge of bed Toileting: Supervision/safety Where Assessed-Toileting: Teacher, adult education: Distant supervision Statistician Method: Proofreader: Engineer, technical sales: Close supervison Web designer  Method: Event organiser: Close supervision Film/video editor Method: Designer, industrial/product: Sales promotion account executive Baseline Vision/History: 1 Wears glasses Patient Visual Report: No change from baseline Vision Assessment?: No apparent visual deficits Perception  Perception: Impaired Perception-Other Comments: Mild R-inattention during mobility with distraction Praxis Praxis: Impaired Praxis Impairment Details: Organization Cognition Cognition Overall Cognitive Status: Impaired/Different from baseline Arousal/Alertness: Awake/alert Memory: Impaired Memory Impairment: Retrieval deficit;Storage deficit;Decreased recall of new information;Decreased short term memory Attention: Selective Selective Attention: Impaired Awareness: Impaired Awareness Impairment: Intellectual impairment Problem Solving: Impaired Problem Solving Impairment: Functional complex;Verbal complex Executive Function: Self Monitoring;Organizing Safety/Judgment: Impaired Comments: Intermediate cues for safety during ADLs. Brief Interview for Mental Status (BIMS) Repetition of Three Words (First Attempt): 3 Temporal Orientation: Year: Correct Temporal Orientation: Month: Accurate within 5 days Temporal Orientation: Day: Correct Recall: Sock: No, could not recall Recall: Blue: Yes, no cue required Recall: Bed: No, could not recall BIMS Summary Score: 11 Sensation Sensation Light Touch: Appears Intact Coordination Gross Motor Movements are Fluid and Coordinated: No Fine Motor Movements are Fluid and Coordinated: Yes Coordination and Movement Description: Deficits due to generalized weakness and decreased/delayed balance strategies. Motor  Motor Motor: Other (comment) Motor - Discharge Observations: Deficits due to generalized weakness and decreased/delayed balance strategies. Improved since eval. Mobility  Bed Mobility Bed Mobility: Rolling Left;Supine to  Sit;Sit to Supine Rolling Left: Independent Supine to Sit: Independent Sit to Supine: Independent Transfers Sit to Stand: Supervision/Verbal cueing Stand to Sit: Supervision/Verbal cueing  Trunk/Postural Assessment  Cervical Assessment Cervical Assessment: Within Functional Limits Thoracic Assessment Thoracic Assessment: Within Functional Limits Lumbar Assessment Lumbar Assessment: Within Functional Limits Postural Control Postural Control: Deficits on evaluation Righting Reactions: Decreased/delayed Protective Responses: Decreased/delayed  Balance Balance Balance Assessed: Yes Static Sitting Balance Static Sitting - Balance Support: Feet supported Static Sitting - Level of Assistance: 7: Independent Dynamic Sitting Balance Dynamic Sitting - Balance Support: During functional activity Dynamic Sitting - Level of Assistance: 6: Modified  independent (Device/Increase time) Static Standing Balance Static Standing - Balance Support: During functional activity Static Standing - Level of Assistance: 5: Stand by assistance (SUP) Dynamic Standing Balance Dynamic Standing - Balance Support: During functional activity Dynamic Standing - Level of Assistance: 5: Stand by assistance (SUP) Extremity/Trunk Assessment RUE Assessment RUE Assessment: Within Functional Limits LUE Assessment LUE Assessment: Within Functional Limits  Skilled Intervention:  Pt greeted resting in bed, no reports of current pain. Functional transfers with supervision. Bathing/dressing with levels of assistance noted above. SW completes rounding during session. Pt made Mod I in room with approval from remainder of treatment team. Pt remained resting in bed with all immediate needs met.   Andrea Bridges, OTR/L, MSOT  12/20/2023, 6:59 AM

## 2023-12-20 NOTE — Progress Notes (Signed)
 Speech Language Pathology Daily Session Note  Patient Details  Name: Andrea Bridges MRN: [id number] Date of Birth: [birth year]  Today's Date: 12/20/2023 SLP Individual Time: 1130-1203 SLP Individual Time Calculation (min): 33 min  Short Term Goals: Week 2: SLP Short Term Goal 1 (Week 2): STGs = LTGs d/t ELOS  Skilled Therapeutic Interventions:   Patient was seen in am to address cognitive re- training. Pt was alert and seen at bedside upon SLP arrival. She was agreeable for session. SLP reviewed WRAP compensatory strategies and examples of utilizaiton. In depth discussion completed on ways to implement strategies post discharge from [facility] program. SLP challenged pt in recall through a paragraph retention task of moderate intensity. SLP guided pt in association and repetition strategies. After a distracted delay ~13 min, she recalled information with 100% acc indep. Pt also challenged in medical problem solving. Given scenarios presented verbally, pt identified solutions with ~80% acc given min A. Pt left in bed at conclusion of session with call button within reach. SLP to sign off this time as pt transitions to next level of care.   Pain Pain Assessment Pain Scale: 0-10 Pain Score: 3  Pain Type: Acute pain Pain Location: Leg Pain Orientation: Right;Left Pain Descriptors / Indicators: Radiating Pain Intervention(s): Medication (See eMAR)  Therapy/Group: Individual Therapy  [name] 12/20/2023, 4:00 PM

## 2023-12-20 NOTE — Progress Notes (Signed)
 Physical Therapy Discharge Summary  Patient Details  Name: Andrea Bridges MRN: [id number] Date of Birth: [birth year]  Date of Discharge from PT service:December 20, 2023  Today's Date: 12/20/2023 PT Individual Time: 0905-1001 PT Individual Time Calculation (min): 56 min    Patient has met 9 of 9 long term goals due to improved activity tolerance, improved balance, improved postural control, increased strength, improved attention, improved awareness, and improved coordination.  Patient to discharge at an ambulatory level Supervision.   Patient's care partner is independent to provide the necessary physical and cognitive assistance at discharge.  Reasons goals not met: N/A  Recommendation:  Patient will benefit from ongoing skilled PT services in outpatient setting to continue to advance safe functional mobility, address ongoing impairments in gait mechanics, NMR, dual tasking, dynamic standing balance, BUE/BLE coordination, and minimize fall risk.  Equipment: No equipment provided  Reasons for discharge: treatment goals met and discharge from hospital  Patient/family agrees with progress made and goals achieved: Yes  PT Discharge Skilled Treatment Interventions: Pt presents in room seated EOB, motivated to participate with PT. Pt denies pain, reports feeling somewhat emotional about situation this AM and reports being excited/nervous about DC tomorrow. Session focused on therapeutic activities for education on DC, DC planning, transfer training, evaluation and education on fall risk, and gait for upright tolerance and endurance. Pt completes all transfers modI throughout session, ambulates short distances in room modI, ambulates with supervision without device on unit in community environment and distances >50'. Pt completes BERG 51/56 indicating low fall risk, pt educated on results and pt demonstrating improving awareness and memory this session. Pt completes up/down 12 steps with BHRs  supervision, car transfer supervision, and ambulating up/down ramp, over mulch and up/down curb with supervision. Pt completes walk test 1155' with supervision, low for age related norms. Pt vitals monitored throughout session with prolonged ambulation, pt BP running consistently soft throughout, asymptomatic and no evidence of orthostatic hypotension noted below. Pt provided with education on vitals management and monitoring at DC with pt verbalizing understanding, as well as education on frequent daily walks as exercise to improve postural stability at DC. Pt returns to room and remains seated EOB with all needs within reach, cal light in place at end of session.   Vitals: Seated BP 91/70 Seated following gait 1000' BP 90/68  Precautions/Restrictions Precautions Precautions: Fall Recall of Precautions/Restrictions: Intact Restrictions Weight Bearing Restrictions Per Provider Order: No Pain Interference Pain Interference Pain Effect on Sleep: 1. Rarely or not at all Pain Interference with Therapy Activities: 1. Rarely or not at all Pain Interference with Day-to-Day Activities: 1. Rarely or not at all Vision/Perception  Vision - History Ability to See in Adequate Light: 1 Impaired Perception Perception: Impaired Preception Impairment Details: Inattention/Neglect Perception-Other Comments: Mild R-inattention during mobility with distraction Praxis Praxis: Impaired Praxis Impairment Details: Organization  Cognition Overall Cognitive Status: Impaired/Different from baseline Arousal/Alertness: Awake/alert Orientation Level: Oriented X4 Year: 2025 Month: August Day of Week: Correct Attention: Alternating Selective Attention: Impaired Alternating Attention: Impaired Alternating Attention Impairment: Verbal complex;Functional basic Memory: Impaired Memory Impairment: Decreased recall of new information;Decreased short term memory Decreased Short Term Memory: Verbal  complex;Functional complex Awareness: Impaired Awareness Impairment: Emergent impairment Problem Solving: Impaired Problem Solving Impairment: Functional complex;Verbal complex Executive Function: Organizing;Decision Making;Self Correcting Organizing: Impaired Organizing Impairment: Verbal complex;Functional complex Decision Making: Impaired Decision Making Impairment: Functional complex;Verbal complex Self Correcting: Impaired Self Correcting Impairment: Verbal complex;Functional complex Safety/Judgment: Appears intact Comments: appears intact during very functional tasks Sensation  Sensation Light Touch: Appears Intact Hot/Cold: Appears Intact Proprioception: Appears Intact Coordination Gross Motor Movements are Fluid and Coordinated: No Fine Motor Movements are Fluid and Coordinated: Yes Coordination and Movement Description: Deficits due to generalized weakness and decreased/delayed balance strategies, improved from eval Motor  Motor Motor: Other (comment) Motor - Discharge Observations: Deficits due to generalized weakness and decreased/delayed balance strategies. Improved since eval.  Mobility Bed Mobility Bed Mobility: Rolling Left;Supine to Sit;Sit to Supine Rolling Left: Independent Supine to Sit: Independent Sit to Supine: Independent Transfers Transfers: Sit to Stand;Stand to Sit;Stand Pivot Transfers Sit to Stand: Supervision/Verbal cueing Stand to Sit: Supervision/Verbal cueing Stand Pivot Transfers: Supervision/Verbal cueing Stand Pivot Transfer Details: Verbal cues for technique Transfer (Assistive device): None Locomotion  Gait Ambulation: Yes Gait Assistance: Supervision/Verbal cueing Gait Distance (Feet): 500 Feet Assistive device: None Gait Assistance Details: Verbal cues for safe use of DME/AE;Verbal cues for gait pattern Gait Gait: Yes Gait Pattern: Impaired (slight L path deviation and LOB to L) Gait velocity: Decreased Stairs / Additional  Locomotion Stairs: Yes Stairs Assistance: Supervision/Verbal cueing Stair Management Technique: Two rails Number of Stairs: 12 Height of Stairs: 6 Ramp: Supervision/Verbal cueing Curb: Supervision/Verbal cueing Wheelchair Mobility Wheelchair Mobility: No  Trunk/Postural Assessment  Cervical Assessment Cervical Assessment: Within Functional Limits Thoracic Assessment Thoracic Assessment: Within Functional Limits Lumbar Assessment Lumbar Assessment: Within Functional Limits Postural Control Postural Control: Deficits on evaluation Righting Reactions: Decreased/delayed Protective Responses: Decreased/delayed  Balance Standardized Balance Assessment Standardized Balance Assessment: Berg Balance Test Berg Balance Test Sit to Stand: Able to stand without using hands and stabilize independently Standing Unsupported: Able to stand safely 2 minutes Sitting with Back Unsupported but Feet Supported on Floor or Stool: Able to sit safely and securely 2 minutes Stand to Sit: Sits safely with minimal use of hands Transfers: Able to transfer safely, minor use of hands Standing Unsupported with Eyes Closed: Able to stand 10 seconds safely Standing Ubsupported with Feet Together: Able to place feet together independently and stand 1 minute safely From Standing, Reach Forward with Outstretched Arm: Can reach confidently >25 cm (10) From Standing Position, Pick up Object from Floor: Able to pick up shoe safely and easily From Standing Position, Turn to Look Behind Over each Shoulder: Looks behind from both sides and weight shifts well Turn 360 Degrees: Able to turn 360 degrees safely in 4 seconds or less Standing Unsupported, Alternately Place Feet on Step/Stool: Able to complete 4 steps without aid or supervision Standing Unsupported, One Foot in Front: Able to plae foot ahead of the other independently and hold 30 seconds Standing on One Leg: Able to lift leg independently and hold equal to or  more than 3 seconds Total Score: 51 Static Sitting Balance Static Sitting - Balance Support: Feet supported Static Sitting - Level of Assistance: 7: Independent Dynamic Sitting Balance Dynamic Sitting - Balance Support: During functional activity Dynamic Sitting - Level of Assistance: 6: Modified independent (Device/Increase time) Static Standing Balance Static Standing - Balance Support: During functional activity Static Standing - Level of Assistance: 5: Stand by assistance Dynamic Standing Balance Dynamic Standing - Balance Support: During functional activity Dynamic Standing - Level of Assistance: 5: Stand by assistance Extremity Assessment  RLE Assessment RLE Assessment: Within Functional Limits LLE Assessment LLE Assessment: Within Functional Limits   Reche Ohara PT, DPT 12/20/2023, 10:07 AM

## 2023-12-20 NOTE — Plan of Care (Signed)
  Problem: RH Balance Goal: LTG Patient will maintain dynamic standing with ADLs (OT) Description: LTG:  Patient will maintain dynamic standing balance with assist during activities of daily living (OT)  Outcome: Completed/Met   Problem: RH Bathing Goal: LTG Patient will bathe all body parts with assist levels (OT) Description: LTG: Patient will bathe all body parts with assist levels (OT) Outcome: Completed/Met   Problem: RH Dressing Goal: LTG Patient will perform lower body dressing w/assist (OT) Description: LTG: Patient will perform lower body dressing with assist, with/without cues in positioning using equipment (OT) Outcome: Completed/Met   Problem: RH Toileting Goal: LTG Patient will perform toileting task (3/3 steps) with assistance level (OT) Description: LTG: Patient will perform toileting task (3/3 steps) with assistance level (OT)  Outcome: Completed/Met   Problem: RH Toilet Transfers Goal: LTG Patient will perform toilet transfers w/assist (OT) Description: LTG: Patient will perform toilet transfers with assist, with/without cues using equipment (OT) Outcome: Completed/Met   Problem: RH Tub/Shower Transfers Goal: LTG Patient will perform tub/shower transfers w/assist (OT) Description: LTG: Patient will perform tub/shower transfers with assist, with/without cues using equipment (OT) Outcome: Completed/Met   Problem: RH Memory Goal: LTG Patient will demonstrate ability for day to day recall/carry over during activities of daily living with assistance level (OT) Description: LTG:  Patient will demonstrate ability for day to day recall/carry over during activities of daily living with assistance level (OT). Outcome: Completed/Met   Problem: RH Attention Goal: LTG Patient will demonstrate this level of attention during functional activites (OT) Description: LTG:  Patient will demonstrate this level of attention during functional activites  (OT) Outcome: Adequate for  Discharge   Problem: RH Awareness Goal: LTG: Patient will demonstrate awareness during functional activites type of (OT) Description: LTG: Patient will demonstrate awareness during functional activites type of (OT) Outcome: Adequate for Discharge

## 2023-12-21 ENCOUNTER — Other Ambulatory Visit (HOSPITAL_COMMUNITY): Payer: Self-pay

## 2023-12-21 LAB — CBC WITH DIFFERENTIAL/PLATELET
Abs Immature Granulocytes: 0.03 K/uL (ref 0.00–0.07)
Basophils Absolute: 0.1 K/uL (ref 0.0–0.1)
Basophils Relative: 1 %
Eosinophils Absolute: 0.2 K/uL (ref 0.0–0.5)
Eosinophils Relative: 3 %
HCT: 35.3 % — ABNORMAL LOW (ref 36.0–46.0)
Hemoglobin: 11.4 g/dL — ABNORMAL LOW (ref 12.0–15.0)
Immature Granulocytes: 1 %
Lymphocytes Relative: 23 %
Lymphs Abs: 1.5 K/uL (ref 0.7–4.0)
MCH: 27.8 pg (ref 26.0–34.0)
MCHC: 32.3 g/dL (ref 30.0–36.0)
MCV: 86.1 fL (ref 80.0–100.0)
Monocytes Absolute: 0.5 K/uL (ref 0.1–1.0)
Monocytes Relative: 7 %
Neutro Abs: 4.2 K/uL (ref 1.7–7.7)
Neutrophils Relative %: 65 %
Platelets: 171 K/uL (ref 150–400)
RBC: 4.1 MIL/uL (ref 3.87–5.11)
RDW: 15 % (ref 11.5–15.5)
WBC: 6.4 K/uL (ref 4.0–10.5)
nRBC: 0 % (ref 0.0–0.2)

## 2023-12-21 LAB — GLUCOSE, CAPILLARY: Glucose-Capillary: 133 mg/dL — ABNORMAL HIGH (ref 70–99)

## 2023-12-21 MED ORDER — VITAMIN D (ERGOCALCIFEROL) 1.25 MG (50000 UNIT) PO CAPS
50000.0000 [IU] | ORAL_CAPSULE | ORAL | 0 refills | Status: DC
  Filled 2023-12-21 (×2): qty 5, 35d supply, fill #0

## 2023-12-21 NOTE — Progress Notes (Signed)
 Inpatient Rehabilitation Care Coordinator Discharge Note   Patient Details  Name: Andrea Bridges MRN: [id number] Date of Birth: 10/11/1960   Discharge location: D/c to her brother's home in [place]  Length of Stay: 11 days  Discharge activity level: Supervision  Home/community participation: LImited  Patient response un:Yzjouy Literacy - How often do you need to have someone help you when you read instructions, pamphlets, or other written material from your doctor or pharmacy?: Rarely  Patient response un:Dnrpjo Isolation - How often do you feel lonely or isolated from those around you?: Rarely  Services provided included: MD, RD, PT, OT, SLP, CM, TR, Pharmacy, Neuropsych, SW, RN  Financial Services:  Field seismologist Utilized: Private Insurance [employer]/ [employer]  Choices offered to/list presented to: patient  Follow-up services arranged:  Outpatient    Outpatient Servicies: [facility] for PT/OT/SLP      Patient response to transportation need: Is the patient able to respond to transportation needs?: Yes In the past 12 months, has lack of transportation kept you from medical appointments or from getting medications?: No In the past 12 months, has lack of transportation kept you from meetings, work, or from getting things needed for daily living?: No   Patient/Family verbalized understanding of follow-up arrangements:  Yes  Individual responsible for coordination of the follow-up plan: contact pt  Confirmed correct DME delivered: Andrea Bridges 12/21/2023    Comments (or additional information):fam edu completed  Summary of Stay    Date/Time Discharge Planning CSW  12/19/23 1536 Pt will d/c to her brother's home in [place]. Fam edu completed on Friday with her brother, his wife  and their dtr. SW will confirm there are no barriers to discharge. AAC  12/12/23 1539 Pt d/c location is likely her brother's home in [place]. D/c locaiton TBD. SW will confirm there are  no barriers to discharge. AAC       Andrea Bridges

## 2023-12-21 NOTE — Progress Notes (Signed)
 Patient ID: Andrea Bridges, female   DOB: Dec 14, 1960, 63 y.o.   MRN: [id number]  SW faxed outpatient referral to [facility] for PT/OT/SLP *SW confirmed that referral was received.    Graeme Jude, MSW, LCSW Office: [phone] Cell: [phone] Fax: [phone]

## 2023-12-21 NOTE — Progress Notes (Incomplete)
 PROGRESS NOTE   Subjective/Complaints: No events overnight.  No acute complaints.   HA 8/10 this AM  Continent of bladder overnight; scan 112  ROS:  Pt denies chills, SOB, abd pain, CP, N/V/C/D, and vision changes + Headache--ongoing, intermittent + Urinary retention--improved + Bilateral neuropathic pain-ongoing  Objective:   No results found.  Recent Labs    12/21/23 0451  WBC 6.4  HGB 11.4*  HCT 35.3*  PLT 171    Recent Labs    12/20/23 1404  NA 137  K 3.8  CL 101  CO2 25  GLUCOSE 96  BUN 13  CREATININE 0.67  CALCIUM 9.5     Intake/Output Summary (Last 24 hours) at 12/21/2023 0859 Last data filed at 12/20/2023 1848 Gross per 24 hour  Intake 240 ml  Output 900 ml  Net -660 ml        Physical Exam: Vital Signs Blood pressure 91/74, pulse 76, temperature 97.9 F (36.6 C), resp. rate 18, SpO2 100%.  General: awake, alert, laying in bed.  No acute distress. HENT: conjugate gaze; oropharynx moist. PERRLA, EOMI.  Minimal swelling posterior to incision line, nonfluctuant, no  drainage.   CV: regular rate and rhythm; no JVD Pulmonary: CTA B/L; no W/R/R- good air movement GI: soft, NT, ND, (+)BS.   Psychiatric: appropriate mood and affect  Neuro:   Mental Status: Alert and oriented x4,  Following commands.  No apparent deficits. Good insight into current deficits Moves all 4 extremities equally antigravity against resistance, 5 out of 5 throughout Cranial nerves II through XII intact REFLEXES: DTR 2+ patella and Achilles bilaterally, no beats of clonus b/l ankles SENSORY: Normal to touch all 4 extremities  MSK: no joint swelling noted   Assessment/Plan: 1. Functional deficits which require 3+ hours per day of interdisciplinary therapy in a comprehensive inpatient rehab setting. Physiatrist is providing close team supervision and 24 hour management of active medical problems listed  below. Physiatrist and rehab team continue to assess barriers to discharge/monitor patient progress toward functional and medical goals  Care Tool:  Bathing    Body parts bathed by patient: Right arm, Left arm, Chest, Abdomen, Front perineal area, Buttocks, Right upper leg, Left upper leg, Right lower leg, Left lower leg, Face         Bathing assist Assist Level: Supervision/Verbal cueing     Upper Body Dressing/Undressing Upper body dressing   What is the patient wearing?: Hospital gown only    Upper body assist Assist Level: Independent    Lower Body Dressing/Undressing Lower body dressing      What is the patient wearing?: Underwear/pull up, Pants     Lower body assist Assist for lower body dressing: Supervision/Verbal cueing     Toileting Toileting    Toileting assist Assist for toileting: Supervision/Verbal cueing     Transfers Chair/bed transfer  Transfers assist     Chair/bed transfer assist level: Independent     Locomotion Ambulation   Ambulation assist      Assist level: Supervision/Verbal cueing Assistive device: No Device Max distance: 1200'   Walk 10 feet activity   Assist     Assist level: Independent Assistive device: No  Device   Walk 50 feet activity   Assist    Assist level: Independent Assistive device: No Device    Walk 150 feet activity   Assist Walk 150 feet activity did not occur: Safety/medical concerns  Assist level: Supervision/Verbal cueing Assistive device: No Device    Walk 10 feet on uneven surface  activity   Assist Walk 10 feet on uneven surfaces activity did not occur: Safety/medical concerns   Assist level: Supervision/Verbal cueing     Wheelchair     Assist Is the patient using a wheelchair?: No (ambulates on unit) Type of Wheelchair: Manual    Wheelchair assist level: Supervision/Verbal cueing Max wheelchair distance: 50    Wheelchair 50 feet with 2 turns  activity    Assist        Assist Level: Supervision/Verbal cueing   Wheelchair 150 feet activity     Assist      Assist Level: Maximal Assistance - Patient 25 - 49%   Blood pressure 91/74, pulse 76, temperature 97.9 F (36.6 C), resp. rate 18, SpO2 100%.  Medical Problem List and Plan: 1. Functional deficits secondary to nontraumatic subarachnoid hemorrhage with hydrocephalus status post EVD removed 7/27 and initial burr hole             -patient may shower, cover incision please             -ELOS/Goals: 10-14 days, PT/OT/SLP sup - 12/21/23            Con't CIR PT OT and SLP    8/5: Family can provide 4-6 hours assistance per day. SLP saying will need full supervision.  Progressing well with therapies.   8/12: Ready for DC at goal level; going to [place]. discussed follow-ups with patient.  The patient is medically ready for discharge to home and will not need follow-up with [facility] PM[facility]. In addition, they will need to follow up with their PCP, Urology, Psychiatry and Neurosurgery.   2.  Antithrombotics: -DVT/anticoagulation:  Pharmaceutical: Lovenox              -antiplatelet therapy: N/A  3. Pain Management: Topamax  25 mg twice daily, Fioricet  as needed, oxycodone  5 mg every 4 hours as needed pain  8/2- needed Fioricet  and tylenol  for HA's today- con't regimen  8/3- HA a little more today- 5-6/10 but she reported this yesterday- will add Lidoderm  patches for low back/buttocks pain 8pm to 8am- explained canot add steroids secondary to NSU issues  8-5: Increase Topamax  to 50 mg twice daily.  Scheduled Tylenol  650 mg 3 times daily, reduce Fioricet  to 1 tablet every 6 hours as needed--patient reporting significant improvement with headaches since Topamax  increase  8-7: DC Topamax , possible contributor to ongoing urinary retention, headaches improving.  Continue with gabapentin .  8-8: Mild increase in headaches since medication adjustment, but tolerable with PRNs.  Continue  8/10  headaches overall controlled with current regimen, continue to monitor  4. Mood/Behavior/Sleep: Provide emotional support             -antipsychotic agents: N/A   - 8/12: Patient asking for mental health services in [place] --  will need to use insurance to find a provider  5. Neuropsych/cognition: This patient is capable of making decisions on her own behalf.   - 8-11: Cognition has improved considerably; patient now has capacity  6. Skin/Wound Care: Routine skin checks  8/2- although sutures are 3 weeks, will not remove until Ok'd by NSU due to concern about fluid drainage- is clear-  not enough right now to test for CSF- d/w nursing as well as NICU nursing and NSU-also con't Staples from EVD site and monitor-  NSU will come see tomorrow during rounds  8/3- appears to be a CSF leak, base don  halo- NSU will see tomorrow- don't see note so far, going off nursing report. See below for more in depth  8-4: Neurosurgery, Dr. Lanis saw yesterday, advised no changes given no signs of infection or CSF headache.  Will come back by today.  8-5: Surgical site looks good, no further drainage reported--per Dr. Lanis, if site remains stable, can remove staples and sutures 8-11  8/11 remove sutures, staples today.  DC IV.--No drainage, well-approximated, appears stable-follow-up  7. Fluids/Electrolytes/Nutrition: Routine in and outs with follow-up chemistries--stable  - 8-4: P.o. intakes poor, noted started on Remeron  over the weekend.  Will monitor for now.  8-5: Eating 50 to 100% of meals; significantly improved.  - Monitor p.o. intakes off of mirtazapine , doing well so far  8.  ?  Seizure.  Keppra  500 mg twice daily.   - Keppra  was initiated 8-1 for possible seizure, negative on EEG.  Will need to continue medication until neurosurgical follow-up  9.  Fevers/E. coli UTI/urinary retention/neurogenic bladder.  7-day course Ancef  completed.  Blood cultures/CSF no growth to date and placed on Bactrim  DS  1 tablet every 12 hours x 10 doses 12/08/2023 for possible UTI.  Currently on airborne and contact precautions pending COVID/RVP results. - 8/2- off precautions   Continue Flomax  0.8 mg daily, Urecholine  10 mg 3 times daily.   - Consider voiding trial in 2 to 3 days  8/2- will leave foley until Sunday/Monday- pt overwhelmed by the idea of in/out cath yet- will let her get used to CIR before removing- x1 day  8/3- foley removed- ordered to bladder scan q6 hours and cath if volmes >350cc- with lidocaine - went over with husband and pt- she's agreeable.   - 8-4: Requiring intermittent straight caths, bethanechol  stopped due to hypotension; continue monitoring today  8-5: Move Flomax  to nightly for hypotension, reduced to 0.4 mg  8/ 6: Had a few continent voids overnight, still requiring intermittent ISC.  Start timed toileting every 4-6 hours, with orders for double voiding on attempts.  Continue Flomax  at current dose due to ongoing hypotension.  8-7: Add baclofen 10 mg 3 times daily, increase Flomax  to 0.8 mg nightly.  Adding a.m. midodrine  to balance out.  DC Topamax , mirtazapine .  Getting renal ultrasound today for further workup.  Urinalysis negative.  Is having regular, daily bowel movements, so doubt contribution of constipation.  8-8: Renal ultrasound unremarkable.  Urinalysis negative.  Blood pressure holding up with current dose of Flomax , Urecholine .  Will give 24 hours for mirtazapine  to washout--patient aware that, if no improvement incontinent voids by tomorrow morning, will recommend replacement of Foley.  8/9 required intermittent cath today but yesterday appears was voiding better, hold off on Foley for now  8/10 bladder function appears to be doing better, continue to monitor bladder scans and PVRs for now--continue  8-11: Needing straight caths about once daily, usually first thing in the morning.  Limited on medication adjustments due to hypotension.  Continue to encourage patient up to  toilet  8-12: No straight caths in the last 24 hours.  Will trial off of the bed today, strict timed bladder scans; can resume [place] follow-up at discharge if needed.  A, will need to establish with urology as outpatient.  10.  Incidental finding of 7 mm nodular density in the medial right lung base.  Follow-up outpatient.  11.  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  Sodium 131-133.  Serum osmolality 289 urine osmolality 740 urine sodium 80.  Continue salt tablets 2 g 3 times daily.  Avoid hypotonic fluids.  8/2- Na better 134--normalized 8-4  8 /6: Reduce salt tabs to 1 g 3 times daily   8-7: BMP stable, reduce salt tabs 2 times daily.  8-8: BMP stable 8-11: DC salt tabs--resolved 8-12: Labs today  prior to discharge  12.  Vasospasm Prevention.  Nimodipine  60 mg every 4 hours. NSGY note indicates for 21 days (end date 8/3)  13.  Tobacco abuse.  NicoDerm patch.  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 Prior to this, husband reports was planning on  stopping vaping, so don't want to encourage that.  8-4: No apparent picking today.  14.  Overweight/Class I obesity.  Last BMI 29.80.  Dietary follow-up 15. Hypokalemia  8/2- K+ 3.4- will replete with 40 Meq x1.   Normalized 8-4  16.  Hypotension  8/2- will con't Nimodipine  however will add midodrine  2.5 mg TID as needed for soft BP- will also order TEDs and ACE wraps if needed.  8/3- BP still soft- like running 90's systolic- will increase midodrine  to 5 mg TID prn- for SBP <100- shouldn't need once Nimodipine  is finished, hopefully  8-5: Abdominal binder, teds, move Flomax  to nightly as above.  Ordered 1 L normal saline at 100 cc/h over the course of today for additional support.  Labs in AM.  8-6: Labs stable, BPs remain soft but less symptomatic orthostasis.  Reducing salt tabs as above, monitor  8-7: DC mirtazapine ,  Topamax .  Adding midodrine  5 mg twice daily while adjusting bladder regimen as below.  orthostats ordered--doing okay  8/10 Increase midodrine  to 5mg  TID--stable symptomatically on this regimen--discharged with     12/21/2023    5:44 AM 12/20/2023    8:15 PM 12/20/2023    1:46 PM  Vitals with BMI  Systolic 91 91 106  Diastolic 74 49 91  Pulse 76 74 93     17. Insomnia/restless at night  8/3- added Remeron  since bladder issues, cannot do Trazadone etc, and has decreased appetite.   8-4: Sleep improved last night.  Monitor.  8-7: DC Remeron  due to urinary retention.  Add as needed melatonin 5 mg, trazodone  50 mg second line  - 8-11: No further endorse sleep issues  18.  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  - Lumbar x-rays ordered for initial workup IMPRESSION: 1. Mild degenerative disc disease at L2-L3, lesser at L4-L5. 2. Facet hypertrophy at L3-L4, L4-L5 and L5-S1.   - Start gabapentin  100 mg 3 times daily; stay low dose due to ongoing orthostasis  - 8-7: Symptoms likely secondary to lumbosacral radiculopathy versus sciatica given significant lumbar arthritis as above; recommend EMG/MRI as outpatient  - No complaints today 8-8  - 8-12: Symptoms significantly increased since stopping gabapentin , but are manageable with repositioning.  Outpatient follow-up appropriate.  LOS: 12 days A FACE TO FACE EVALUATION WAS PERFORMED  Joesph JAYSON Likes 12/21/2023, 8:59 AM

## 2023-12-21 NOTE — Progress Notes (Signed)
 PROGRESS NOTE   Subjective/Complaints: No events overnight.  No acute complaints.   HA 8/10 this AM--most of her pain this a.m. is actually coming from her sciatica, which was bothersome overnight.  Planning on following up outpatient further for this..  Continent of bladder overnight; bladder scans low.  ROS:  Pt denies chills, SOB, abd pain, CP, N/V/C/D, and vision changes + Headache--ongoing, intermittent + Urinary retention--improved + Bilateral neuropathic pain-ongoing  Objective:   No results found.  Recent Labs    12/21/23 0451  WBC 6.4  HGB 11.4*  HCT 35.3*  PLT 171    Recent Labs    12/20/23 1404  NA 137  K 3.8  CL 101  CO2 25  GLUCOSE 96  BUN 13  CREATININE 0.67  CALCIUM 9.5     Intake/Output Summary (Last 24 hours) at 12/21/2023 0859 Last data filed at 12/20/2023 1848 Gross per 24 hour  Intake 240 ml  Output 900 ml  Net -660 ml        Physical Exam: Vital Signs Blood pressure 91/74, pulse 76, temperature 97.9 F (36.6 C), resp. rate 18, SpO2 100%.  General: awake, alert, laying in bed.  No acute distress. HENT: conjugate gaze; oropharynx moist. PERRLA, EOMI. no swelling posterior to incision line, nonfluctuant, no  drainage.   CV: regular rate and rhythm; no JVD Pulmonary: CTA B/L; no W/R/R- good air movement GI: soft, NT, ND, (+)BS.   Psychiatric: appropriate mood and affect  Neuro:   Mental Status: Alert and oriented x4,  Following commands.  No apparent deficits. Good insight into current deficits Moves all 4 extremities equally antigravity against resistance, 5 out of 5 throughout Cranial nerves II through XII intact REFLEXES: DTR 2+ patella and Achilles bilaterally, no beats of clonus b/l ankles SENSORY: Normal to touch all 4 extremities  MSK: no joint swelling noted  Physical exam unchanged from the above on reexamination 12/22/23    Assessment/Plan: 1. Functional  deficits which require 3+ hours per day of interdisciplinary therapy in a comprehensive inpatient rehab setting. Physiatrist is providing close team supervision and 24 hour management of active medical problems listed below. Physiatrist and rehab team continue to assess barriers to discharge/monitor patient progress toward functional and medical goals  Care Tool:  Bathing    Body parts bathed by patient: Right arm, Left arm, Chest, Abdomen, Front perineal area, Buttocks, Right upper leg, Left upper leg, Right lower leg, Left lower leg, Face         Bathing assist Assist Level: Supervision/Verbal cueing     Upper Body Dressing/Undressing Upper body dressing   What is the patient wearing?: Hospital gown only    Upper body assist Assist Level: Independent    Lower Body Dressing/Undressing Lower body dressing      What is the patient wearing?: Underwear/pull up, Pants     Lower body assist Assist for lower body dressing: Supervision/Verbal cueing     Toileting Toileting    Toileting assist Assist for toileting: Supervision/Verbal cueing     Transfers Chair/bed transfer  Transfers assist     Chair/bed transfer assist level: Independent     Locomotion Ambulation   Ambulation assist  Assist level: Supervision/Verbal cueing Assistive device: No Device Max distance: 1200'   Walk 10 feet activity   Assist     Assist level: Independent Assistive device: No Device   Walk 50 feet activity   Assist    Assist level: Independent Assistive device: No Device    Walk 150 feet activity   Assist Walk 150 feet activity did not occur: Safety/medical concerns  Assist level: Supervision/Verbal cueing Assistive device: No Device    Walk 10 feet on uneven surface  activity   Assist Walk 10 feet on uneven surfaces activity did not occur: Safety/medical concerns   Assist level: Supervision/Verbal cueing     Wheelchair     Assist Is the  patient using a wheelchair?: No (ambulates on unit) Type of Wheelchair: Manual    Wheelchair assist level: Supervision/Verbal cueing Max wheelchair distance: 50    Wheelchair 50 feet with 2 turns activity    Assist        Assist Level: Supervision/Verbal cueing   Wheelchair 150 feet activity     Assist      Assist Level: Maximal Assistance - Patient 25 - 49%   Blood pressure 91/74, pulse 76, temperature 97.9 F (36.6 C), resp. rate 18, SpO2 100%.  Medical Problem List and Plan: 1. Functional deficits secondary to nontraumatic subarachnoid hemorrhage with hydrocephalus status post EVD removed 7/27 and initial burr hole             -patient may shower, cover incision please             -ELOS/Goals: 10-14 days, PT/OT/SLP sup - 12/21/23            Con't CIR PT OT and SLP    8/5: Family can provide 4-6 hours assistance per day. SLP saying will need full supervision.  Progressing well with therapies.   8/12: Ready for DC at goal level; going to [place]. discussed follow-ups with patient.  The patient is medically ready for discharge to home and will not need follow-up with [facility] PM&R. In addition, they will need to follow up with their PCP, Urology, Psychiatry and Neurosurgery.   2.  Antithrombotics: -DVT/anticoagulation:  Pharmaceutical: Lovenox              -antiplatelet therapy: N/A  3. Pain Management: Topamax  25 mg twice daily, Fioricet  as needed, oxycodone  5 mg every 4 hours as needed pain  8/2- needed Fioricet  and tylenol  for HA's today- con't regimen  8/3- HA a little more today- 5-6/10 but she reported this yesterday- will add Lidoderm  patches for low back/buttocks pain 8pm to 8am- explained canot add steroids secondary to NSU issues  8-5: Increase Topamax  to 50 mg twice daily.  Scheduled Tylenol  650 mg 3 times daily, reduce Fioricet  to 1 tablet every 6 hours as needed--patient reporting significant improvement with headaches since Topamax  increase  8-7: DC Topamax ,  possible contributor to ongoing urinary retention, headaches improving.  Continue with gabapentin .  8-8: Mild increase in headaches since medication adjustment, but tolerable with PRNs.  Continue  8/10 headaches overall controlled with current regimen, continue to monitor  4. Mood/Behavior/Sleep: Provide emotional support             -antipsychotic agents: N/A   - 8/12: Patient asking for mental health services in [place] --  will need to use insurance to find a provider  5. Neuropsych/cognition: This patient is capable of making decisions on her own behalf.   - 8-11: Cognition has improved considerably; patient now has  capacity  6. Skin/Wound Care: Routine skin checks  8/2- although sutures are 3 weeks, will not remove until Ok'd by NSU due to concern about fluid drainage- is clear- not enough right now to test for CSF- d/w nursing as well as NICU nursing and NSU-also con't Staples from EVD site and monitor-  NSU will come see tomorrow during rounds  8/3- appears to be a CSF leak, base don  halo- NSU will see tomorrow- don't see note so far, going off nursing report. See below for more in depth  8-4: Neurosurgery, Dr. Lanis saw yesterday, advised no changes given no signs of infection or CSF headache.  Will come back by today.  8-5: Surgical site looks good, no further drainage reported--per Dr. Lanis, if site remains stable, can remove staples and sutures 8-11  8/11 remove sutures, staples today.  DC IV.--No drainage, well-approximated, appears stable-follow-up  7. Fluids/Electrolytes/Nutrition: Routine in and outs with follow-up chemistries--stable  - 8-4: P.o. intakes poor, noted started on Remeron  over the weekend.  Will monitor for now.  8-5: Eating 50 to 100% of meals; significantly improved.  - Monitor p.o. intakes off of mirtazapine , doing well so far  8.  ?  Seizure.  Keppra  500 mg twice daily.   - Keppra  was initiated 8-1 for possible seizure, negative on EEG.  Will need to  continue medication until neurosurgical follow-up  9.  Fevers/E. coli UTI/urinary retention/neurogenic bladder.  7-day course Ancef  completed.  Blood cultures/CSF no growth to date and placed on Bactrim  DS 1 tablet every 12 hours x 10 doses 12/08/2023 for possible UTI.  Currently on airborne and contact precautions pending COVID/RVP results. - 8/2- off precautions   Continue Flomax  0.8 mg daily, Urecholine  10 mg 3 times daily.   - Consider voiding trial in 2 to 3 days  8/2- will leave foley until Sunday/Monday- pt overwhelmed by the idea of in/out cath yet- will let her get used to CIR before removing- x1 day  8/3- foley removed- ordered to bladder scan q6 hours and cath if volmes >350cc- with lidocaine - went over with husband and pt- she's agreeable.   - 8-4: Requiring intermittent straight caths, bethanechol  stopped due to hypotension; continue monitoring today  8-5: Move Flomax  to nightly for hypotension, reduced to 0.4 mg  8/ 6: Had a few continent voids overnight, still requiring intermittent ISC.  Start timed toileting every 4-6 hours, with orders for double voiding on attempts.  Continue Flomax  at current dose due to ongoing hypotension.  8-7: Add baclofen 10 mg 3 times daily, increase Flomax  to 0.8 mg nightly.  Adding a.m. midodrine  to balance out.  DC Topamax , mirtazapine .  Getting renal ultrasound today for further workup.  Urinalysis negative.  Is having regular, daily bowel movements, so doubt contribution of constipation.  8-8: Renal ultrasound unremarkable.  Urinalysis negative.  Blood pressure holding up with current dose of Flomax , Urecholine .  Will give 24 hours for mirtazapine  to washout--patient aware that, if no improvement incontinent voids by tomorrow morning, will recommend replacement of Foley.  8/9 required intermittent cath today but yesterday appears was voiding better, hold off on Foley for now  8/10 bladder function appears to be doing better, continue to monitor bladder  scans and PVRs for now--continue  8-11: Needing straight caths about once daily, usually first thing in the morning.  Limited on medication adjustments due to hypotension.  Continue to encourage patient up to toilet  8-12: No straight caths in the last 24 hours.  Will  trial off of the bed today, strict timed bladder scans; can resume Profender follow-up at discharge if needed.  A, will need to establish with urology as outpatient.   8-13: Continent of bladder with low Skains for the last 24 hours.  DC on current Flomax , outpatient urology follow-up    10.  Incidental finding of 7 mm nodular density in the medial right lung base.  Follow-up outpatient.  11.  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  Sodium 131-133.  Serum osmolality 289 urine osmolality 740 urine sodium 80.  Continue salt tablets 2 g 3 times daily.  Avoid hypotonic fluids.  8/2- Na better 134--normalized 8-4  8 /6: Reduce salt tabs to 1 g 3 times daily   8-7: BMP stable, reduce salt tabs 2 times daily.  8-8: BMP stable 8-11: DC salt tabs--resolved 8-12: Labs today  prior to discharge  12.  Vasospasm Prevention.  Nimodipine  60 mg every 4 hours. NSGY note indicates for 21 days (end date 8/3)  13.  Tobacco abuse.  NicoDerm patch.  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 Prior to this, husband reports was planning on  stopping vaping, so don't want to encourage that.  8-4: No apparent picking today.  14.  Overweight/Class I obesity.  Last BMI 29.80.  Dietary follow-up 15. Hypokalemia  8/2- K+ 3.4- will replete with 40 Meq x1.   Normalized 8-4  16.  Hypotension  8/2- will con't Nimodipine  however will add midodrine  2.5 mg TID as needed for soft BP- will also order TEDs and ACE wraps if needed.  8/3- BP still soft- like running 90's systolic- will increase midodrine  to 5 mg TID prn- for SBP <100- shouldn't need once  Nimodipine  is finished, hopefully  8-5: Abdominal binder, teds, move Flomax  to nightly as above.  Ordered 1 L normal saline at 100 cc/h over the course of today for additional support.  Labs in AM.  8-6: Labs stable, BPs remain soft but less symptomatic orthostasis.  Reducing salt tabs as above, monitor  8-7: DC mirtazapine , Topamax .  Adding midodrine  5 mg twice daily while adjusting bladder regimen as below.  orthostats ordered--doing okay  8/10 Increase midodrine  to 5mg  TID--stable symptomatically on this regimen--discharged with     12/21/2023    5:44 AM 12/20/2023    8:15 PM 12/20/2023    1:46 PM  Vitals with BMI  Systolic 91 91 106  Diastolic 74 49 91  Pulse 76 74 93     17. Insomnia/restless at night  8/3- added Remeron  since bladder issues, cannot do Trazadone etc, and has decreased appetite.   8-4: Sleep improved last night.  Monitor.  8-7: DC Remeron  due to urinary retention.  Add as needed melatonin 5 mg, trazodone  50 mg second line  - 8-11: No further endorse sleep issues  18.  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  - Lumbar x-rays ordered for initial workup IMPRESSION: 1. Mild degenerative disc disease at L2-L3, lesser at L4-L5. 2. Facet hypertrophy at L3-L4, L4-L5 and L5-S1.   - Start gabapentin  100 mg 3 times daily; stay low dose due to ongoing orthostasis  - 8-7: Symptoms likely secondary to lumbosacral radiculopathy versus sciatica given significant lumbar arthritis as above; recommend EMG/MRI as outpatient  - No complaints today 8-8  - 8-12: Symptoms significantly increased since stopping gabapentin , but are manageable with repositioning.  Outpatient follow-up  appropriate.  LOS: 12 days A FACE TO FACE EVALUATION WAS PERFORMED  Joesph JAYSON Likes 12/21/2023, 8:59 AM

## 2023-12-23 ENCOUNTER — Ambulatory Visit

## 2024-01-04 ENCOUNTER — Encounter: Attending: Physical Medicine and Rehabilitation | Admitting: Physical Medicine and Rehabilitation

## 2024-01-04 ENCOUNTER — Encounter: Payer: Self-pay | Admitting: Physical Medicine and Rehabilitation

## 2024-01-04 VITALS — BP 114/78 | HR 81 | Ht 67.0 in | Wt 190.4 lb

## 2024-01-04 DIAGNOSIS — R339 Retention of urine, unspecified: Secondary | ICD-10-CM | POA: Insufficient documentation

## 2024-01-04 DIAGNOSIS — I610 Nontraumatic intracerebral hemorrhage in hemisphere, subcortical: Secondary | ICD-10-CM | POA: Diagnosis present

## 2024-01-04 DIAGNOSIS — I69119 Unspecified symptoms and signs involving cognitive functions following nontraumatic intracerebral hemorrhage: Secondary | ICD-10-CM | POA: Diagnosis present

## 2024-01-04 DIAGNOSIS — R519 Headache, unspecified: Secondary | ICD-10-CM | POA: Diagnosis present

## 2024-01-04 NOTE — Progress Notes (Unsigned)
 Subjective:    Patient ID: Andrea Bridges, female    DOB: [birth year], 63 y.o.   MRN: [id number]  HPI  Andrea Bridges is a 63 y.o. year old female  who  has a past medical history of Acute appendicitis with perforation and localized peritonitis, without abscess or gangrene (12/11/2019), Anxiety, Infertility, female, PCOS (polycystic ovarian syndrome), Skin cancer, and SOBOE (shortness of breath on exertion) (09/08/2022).   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   Interval Hx:  - Therapies:  She went to assessments at PT, OT, and SLP wednesday; Friday will be starting. She says PT and OT were pleased with what she has been doing on her own, will be working on balance. 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   She has been walking a Loss adjuster, chartered independently.    - Follow ups: She has f/u with Dr. Lanis today and 8/29 with Dr. Rosemarie with neurology.    - Falls: none   - DME: She started using a shower chair but has stopped, now just uses grab bars in the shower.    - Medications: Took fioricet  once when she got up in the morning; overall well controlled with tylenol . Has not needed oxycodone .   Still using midodrine  for her BP. Her SBP has been running 110-100s; no lightheadedness or dizziness.   Continues Keppra   Continues flomax  - has had no issue with urinary retention, peeing 4-5x per day. No continence issues.   Is about to start nicotine  patches - she has been vaping up until this point but is motivated to quit.  Bowel movements have been regular since     - Other concerns:   Pain Inventory Average Pain 1 Pain Right Now 1 My pain is No pain noted  LOCATION OF PAIN  Led  BOWEL Number of stools per week: 2 Oral laxative  use No  Type of laxative N/A Enema or suppository use N/A History of colostomy No  Incontinent No   BLADDER Normal In and out cath, frequency N/A Able to self cath N/A Bladder incontinence N/A Frequent urination No Leakage with coughing No  Difficulty starting stream No  Incomplete bladder emptying No    Mobility walk without assistance how many minutes can you walk? 15-20 minutes every other day ability to climb steps?  yes do you drive?  no Do you have any goals in this area?  no  Function employed # of hrs/week 40 Do you have any goals in this area?  no  Neuro/Psych No problems in this area  Prior Studies Any changes since last visit?  yes  Physicians involved in your care Primary care [name], DO Neurologist Dr. Rosemarie, MD   Family History  Problem Relation Age of Onset   Thyroid  disease Mother    Anxiety disorder Mother    Obesity Mother    Cancer Father        skin   Sleep apnea Father    Social History   Socioeconomic History   Marital status: Single    Spouse name: Not on file   Number of children: Not on file   Years of education: Not on file   Highest education level: Not on file  Occupational History   Not on  file  Tobacco Use   Smoking status: Every Day    Types: Cigarettes, E-cigarettes   Smokeless tobacco: Never  Vaping Use   Vaping status: Never Used  Substance and Sexual Activity   Alcohol use: Not Currently   Drug use: Never   Sexual activity: Not Currently  Other Topics Concern   Not on file  Social History Narrative   Marital status/children/pets: Divorced, 1 child   Education/employment: Some college.  Works as a Education administrator:      -smoke alarm in the home:Yes     - wears seatbelt: Yes     - Feels safe in their relationships: Yes      Social Drivers of Corporate investment banker Strain: Not on file  Food Insecurity: Patient Unable To Answer (11/20/2023)   Hunger Vital Sign    Worried About  Running Out of Food in the Last Year: Patient unable to answer    Ran Out of Food in the Last Year: Patient unable to answer  Transportation Needs: Patient Unable To Answer (11/20/2023)   PRAPARE - Transportation    Lack of Transportation (Medical): Patient unable to answer    Lack of Transportation (Non-Medical): Patient unable to answer  Physical Activity: Not on file  Stress: Not on file  Social Connections: Unknown (09/22/2021)   Received from [facility]   Social Network    Social Network: Not on file   Past Surgical History:  Procedure Laterality Date   APPENDECTOMY  2021   BREAST BIOPSY     IR ANGIO INTRA EXTRACRAN SEL INTERNAL CAROTID BILAT MOD SED  11/29/2023   IR ANGIO VERTEBRAL SEL VERTEBRAL BILAT MOD SED  11/29/2023   IR ANGIO VERTEBRAL SEL VERTEBRAL UNI R MOD SED  11/22/2023   IR US  GUIDE VASC ACCESS RIGHT  11/29/2023   Past Medical History:  Diagnosis Date   Acute appendicitis with perforation and localized peritonitis, without abscess or gangrene 12/11/2019   Anxiety    Infertility, female    PCOS (polycystic ovarian syndrome)    Skin cancer    basal cell   SOBOE (shortness of breath on exertion) 09/08/2022   BP 114/78 (BP Location: Left Arm, Patient Position: Sitting, Cuff Size: Normal)   Pulse 81   Ht 5' 7 (1.702 m)   Wt 190 lb 6.4 oz (86.4 kg)   LMP  (LMP Unknown)   SpO2 98%   BMI 29.82 kg/m   Opioid Risk Score:   Fall Risk Score:  `1  Depression screen [facility] 2/9     09/22/2023    7:46 AM 06/08/2023    9:53 AM  Depression screen PHQ 2/9  Decreased Interest 1 1  Down, Depressed, Hopeless 0 0  PHQ - 2 Score 1 1  Altered sleeping 0 0  Tired, decreased energy 1 1  Change in appetite 2   Feeling bad or failure about yourself  0 0  Trouble concentrating 0 0  Moving slowly or fidgety/restless 0 0  Suicidal thoughts 0 0  PHQ-9 Score 4 2  Difficult doing work/chores Not difficult at all Not difficult at all      Review of Systems  Musculoskeletal:         Right leg dull ache  All other systems reviewed and are negative.      Objective:   Physical Exam  Constitutional: No apparent distress. Appropriate appearance for age.  HENT: No JVD. Neck Supple. Trachea midline. Atraumatic, normocephalic. Eyes: PERRLA. EOMI.  Visual fields grossly intact.  Cardiovascular: RRR, no murmurs/rub/gallops. No Edema. Peripheral pulses 2+  Respiratory: CTAB. No rales, rhonchi, or wheezing. On RA.  Abdomen: + bowel sounds, normoactive. No distention or tenderness.  Skin: C/D/I. No apparent lesions. Prior drain sites with a few small scabs but mostly healed. No drainage.   MSK:      No apparent deformity.       Neurologic exam:  Cognition: AAO to person, place, time and event.  Language: Fluent, No substitutions or neoglisms. No dysarthria. Names 3/3 objects correctly.  Memory: Recalls 3/3 objects at 5 minutes. No apparent deficits  Insight: Good  insight into current condition.  Mood: Pleasant affect, appropriate mood.  Sensation: To light touch intact in BL UEs and LEs  Reflexes: 2+ in BL UE and LEs. Negative Hoffman's and babinski signs bilaterally.  CN: 2-12 grossly intact.  Coordination: No apparent tremors. No ataxia on FTN, HTS bilaterally.  Spasticity: MAS 0 in all extremities.  Strength: 5/5 throughout         Assessment & Plan:   Andrea Bridges is a 63 y.o. year old female  who  has a past medical history of Acute appendicitis with perforation and localized peritonitis, without abscess or gangrene (12/11/2019), Anxiety, Infertility, female, PCOS (polycystic ovarian syndrome), Skin cancer, and SOBOE (shortness of breath on exertion) (09/08/2022).     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  At this time, I estimate 2 months to return to work  Urinary retention Stop flomax ; keep a diary and try to use the bathroom at least once every 6 hours. If you cannot pee for 6-12 hours over the next few days, resume the medication.   Once you are off of flomax , you can stop midodrine . Keep a diary of your blood pressure and if the high number goes to 80-90s, resume the medication. Your primary care doctor can manage with at your follow up.  Headaches    Stop oxycodone . Use fioricet  sparingly for severe headaches; I will not renew this medication.

## 2024-01-04 NOTE — Patient Instructions (Addendum)
 Stop flomax ; keep a diary and try to use the bathroom at least once every 6 hours. If you cannot pee for 6-12 hours over the next few days, resume the medication.   Once you are off of flomax , you can stop midodrine . Keep a diary of your blood pressure and if the high number goes to 80-90s, resume the medication. Your primary care doctor can manage with at your follow up.   Stop oxycodone . Use fioricet  sparingly for severe headaches; I will not renew this medication.   Continue to refrain from driving and continue assistance at home; continue therapies and follow up with me via telehealth in 1 month to address return to work, driving, and independence.  At this time, I estimate 2 months to return to work

## 2024-01-06 ENCOUNTER — Telehealth: Payer: Self-pay | Admitting: Neurology

## 2024-01-06 DIAGNOSIS — R339 Retention of urine, unspecified: Secondary | ICD-10-CM | POA: Insufficient documentation

## 2024-01-06 NOTE — Telephone Encounter (Signed)
 Pt called to cancel appt due to seing a different Neurologist    Appt Cancel

## 2024-01-12 ENCOUNTER — Encounter: Payer: Self-pay | Admitting: Physical Medicine and Rehabilitation

## 2024-01-12 MED ORDER — PANTOPRAZOLE SODIUM 40 MG PO TBEC: 40.0000 mg | DELAYED_RELEASE_TABLET | Freq: Every day | ORAL | 0 refills | Status: DC

## 2024-01-12 MED ORDER — LEVETIRACETAM 500 MG PO TABS
500.0000 mg | ORAL_TABLET | Freq: Two times a day (BID) | ORAL | 0 refills | Status: DC
Start: 2024-01-12 — End: 2024-03-16

## 2024-01-12 MED ORDER — MIDODRINE HCL 5 MG PO TABS: 5.0000 mg | ORAL_TABLET | Freq: Three times a day (TID) | ORAL | 0 refills | Status: DC

## 2024-01-17 ENCOUNTER — Telehealth: Payer: Self-pay | Admitting: Family Medicine

## 2024-01-17 NOTE — Telephone Encounter (Signed)
 Please call patient and schedule her for a office visit. It is time for us  to touch base and schedule her 14-month follow-up with repeat CT of her lungs to revaluate the pulmonary nodules visualized in June.  The results in June were considered possibly suspicious pulmonary nodules being present.  Therefore, it is important to stay on track with this 82-month follow-up.

## 2024-01-17 NOTE — Telephone Encounter (Signed)
 noted

## 2024-01-30 ENCOUNTER — Encounter: Payer: Self-pay | Admitting: Physical Medicine and Rehabilitation

## 2024-02-05 ENCOUNTER — Other Ambulatory Visit: Payer: Self-pay | Admitting: Physical Medicine and Rehabilitation

## 2024-02-06 ENCOUNTER — Encounter: Payer: Self-pay | Attending: Physical Medicine and Rehabilitation | Admitting: Physical Medicine and Rehabilitation

## 2024-02-06 ENCOUNTER — Inpatient Hospital Stay: Admitting: Neurology

## 2024-02-06 VITALS — BP 108/74 | HR 71 | Ht 67.0 in | Wt 197.0 lb

## 2024-02-06 DIAGNOSIS — I69119 Unspecified symptoms and signs involving cognitive functions following nontraumatic intracerebral hemorrhage: Secondary | ICD-10-CM | POA: Diagnosis present

## 2024-02-06 DIAGNOSIS — R519 Headache, unspecified: Secondary | ICD-10-CM | POA: Diagnosis present

## 2024-02-06 DIAGNOSIS — R339 Retention of urine, unspecified: Secondary | ICD-10-CM | POA: Insufficient documentation

## 2024-02-06 DIAGNOSIS — I610 Nontraumatic intracerebral hemorrhage in hemisphere, subcortical: Secondary | ICD-10-CM | POA: Diagnosis not present

## 2024-02-06 NOTE — Patient Instructions (Addendum)
 Cleared to return home and to remote work in 1 week  Discuss keppra  with Dr Lanis  Follow up with me in 6 months

## 2024-02-06 NOTE — Progress Notes (Signed)
 Subjective:    Patient ID: Andrea Bridges, female    DOB: 07/28/1960, 63 y.o.   MRN: [id number]  HPI  Andrea Bridges is a 63 y.o. year old female  who  has a past medical history of Acute appendicitis with perforation and localized peritonitis, without abscess or gangrene (12/11/2019), Anxiety, Infertility, female, PCOS (polycystic ovarian syndrome), Skin cancer, and SOBOE (shortness of breath on exertion) (09/08/2022).     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   At this time, I estimate 2 months to return to work   Urinary retention Stop flomax ; keep a diary and try to use the bathroom at least once every 6 hours. If you cannot pee for 6-12 hours over the next few days, resume the medication.    Once you are off of flomax , you can stop midodrine . Keep a diary of your blood pressure and if the high number goes to 80-90s, resume the medication. Your primary care doctor can manage with at your follow up.   Headaches    Stop oxycodone . Use fioricet  sparingly for severe headaches; I will not renew this medication.   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 She was discharged from PT and did great!  - Follow ups: Seeing Dr Lanis today - may be getting follow up MRI  - Falls:none  - IFZ:wnwz   - Medications: Has needed tylenol  once for headaches.  Keppra   - continues on this Midodrine  - her BP has been running about the same - she  did come off it once and noticed her BP ran in the upper 90s/50s, she was asymptomatic.  She did stop flomax  - no side effects Wants to come off protonix  -  no hx GI issues Not using zoloft Starting nicotine  patches soon Wondering about resuming wegovy   - starting back in weight loss program   - Other concerns: She is currently on STD but thinks she can get back to work part time. Wants a week to acclimate to living independently again - work is offering her to [facility] full time for 1 month if needed.   Pain Inventory Average Pain 1 Pain Right Now 0 My pain is na  In the last 24 hours, has pain interfered with the following? General activity 0 Relation with others 0 Enjoyment of life 0 What TIME of day is your pain at its worst? variesna Sleep (in general) Good  Pain is worse with: na Pain improves with: na Relief from Meds: na  Family History  Problem Relation Age of Onset   Thyroid  disease Mother    Anxiety disorder Mother    Obesity Mother    Cancer Father        skin   Sleep apnea Father    Social History   Socioeconomic History   Marital status: Single    Spouse name: Not on file   Number of children: Not on file   Years of education: Not on file   Highest education level: Not on  file  Occupational History   Not on file  Tobacco Use   Smoking status: Every Day    Types: Cigarettes, E-cigarettes   Smokeless tobacco: Never  Vaping Use   Vaping status: Never Used  Substance and Sexual Activity   Alcohol use: Not Currently   Drug use: Never   Sexual activity: Not Currently  Other Topics Concern   Not on file  Social History Narrative   Marital status/children/pets: Divorced, 1 child   Education/employment: Some college.  Works as a Education administrator:      -smoke alarm in the home:Yes     - wears seatbelt: Yes     - Feels safe in their relationships: Yes      Social Drivers of Corporate investment banker Strain: Not on file  Food Insecurity:  Patient Unable To Answer (11/20/2023)   Hunger Vital Sign    Worried About Running Out of Food in the Last Year: Patient unable to answer    Ran Out of Food in the Last Year: Patient unable to answer  Transportation Needs: Patient Unable To Answer (11/20/2023)   PRAPARE - Transportation    Lack of Transportation (Medical): Patient unable to answer    Lack of Transportation (Non-Medical): Patient unable to answer  Physical Activity: Not on file  Stress: Not on file  Social Connections: Unknown (09/22/2021)   Received from [facility]   Social Network    Social Network: Not on file   Past Surgical History:  Procedure Laterality Date   APPENDECTOMY  2021   BREAST BIOPSY     IR ANGIO INTRA EXTRACRAN SEL INTERNAL CAROTID BILAT MOD SED  11/29/2023   IR ANGIO VERTEBRAL SEL VERTEBRAL BILAT MOD SED  11/29/2023   IR ANGIO VERTEBRAL SEL VERTEBRAL UNI R MOD SED  11/22/2023   IR US  GUIDE VASC ACCESS RIGHT  11/29/2023   Past Surgical History:  Procedure Laterality Date   APPENDECTOMY  2021   BREAST BIOPSY     IR ANGIO INTRA EXTRACRAN SEL INTERNAL CAROTID BILAT MOD SED  11/29/2023   IR ANGIO VERTEBRAL SEL VERTEBRAL BILAT MOD SED  11/29/2023   IR ANGIO VERTEBRAL SEL VERTEBRAL UNI R MOD SED  11/22/2023   IR US  GUIDE VASC ACCESS RIGHT  11/29/2023   Past Medical History:  Diagnosis Date   Acute appendicitis with perforation and localized peritonitis, without abscess or gangrene 12/11/2019   Anxiety    Infertility, female    PCOS (polycystic ovarian syndrome)    Skin cancer    basal cell   SOBOE (shortness of breath on exertion) 09/08/2022   BP 108/74   Pulse 71   Ht 5' 7 (1.702 m)   Wt 197 lb (89.4 kg)   LMP  (LMP Unknown)   SpO2 98%   BMI 30.85 kg/m   Opioid Risk Score:   Fall Risk Score:  `1  Depression screen [facility] 2/9     09/22/2023    7:46 AM 06/08/2023    9:53 AM  Depression screen PHQ 2/9  Decreased Interest 1 1  Down, Depressed, Hopeless 0 0  PHQ - 2 Score 1 1  Altered  sleeping 0 0  Tired, decreased energy 1 1  Change in appetite 2   Feeling bad or failure about yourself  0 0  Trouble concentrating 0 0  Moving slowly or fidgety/restless 0 0  Suicidal thoughts 0 0  PHQ-9 Score 4 2  Difficult doing work/chores Not difficult at all  Not difficult at all      Review of Systems     Objective:   Physical Exam  Constitutional: No apparent distress. Appropriate appearance for age.  HENT: No JVD. Neck Supple. Trachea midline. Atraumatic, normocephalic. Eyes: PERRLA. EOMI. Visual fields grossly intact.  Cardiovascular: RRR, no murmurs/rub/gallops. No Edema. Peripheral pulses 2+  Respiratory: CTAB. No rales, rhonchi, or wheezing. On RA.  Abdomen: + bowel sounds, normoactive. No distention or tenderness.  Skin: C/D/I. No apparent lesions. Prior drain sites with a few small scabs but mostly healed. No drainage.    MSK:      No apparent deformity.       Neurologic exam:  Cognition: AAO to person, place, time and event.  Language: Fluent, No substitutions or neoglisms. No dysarthria. Names 3/3 objects correctly.  Memory: Recalls 3/3 objects at 5 minutes. No apparent deficits  Insight: Good  insight into current condition.  Mood: Pleasant affect, appropriate mood.  Sensation: To light touch intact in BL UEs and LEs  Reflexes: 2+ in BL UE and LEs. Negative Hoffman's and babinski signs bilaterally.  CN: 2-12 grossly intact.  Coordination: No apparent tremors. No ataxia on FTN, HTS bilaterally.  Spasticity: MAS 0 in all extremities.  Strength: 5/5 throughout  Gait: stable stance and stride; good balance with tandem and to walking     Assessment & Plan:    Andrea Bridges is a 63 y.o. year old female  who  has a past medical history of Acute appendicitis with perforation and localized peritonitis, without abscess or gangrene (12/11/2019), Anxiety, Infertility, female, PCOS (polycystic ovarian syndrome), Skin cancer, and SOBOE (shortness of breath on exertion)  (09/08/2022).    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  Discuss keppra  with Dr Lanis - patient had seizure-like episode in early hospital course but negative EEGs and complicated by other medical factors. Understands if determined to be a true seizure, she will need to abstian from driving for 6 months post-event and continue keppra .   Follow up with me in 6 months

## 2024-02-15 LAB — LIPID PANEL
Cholesterol: 256 — AB (ref 0–200)
HDL: 97 — AB (ref 35–70)
LDL Cholesterol: 146
Triglycerides: 68 (ref 40–160)

## 2024-02-15 LAB — HEMOGLOBIN A1C: Hemoglobin A1C: 5.6

## 2024-02-15 LAB — BASIC METABOLIC PANEL WITH GFR: Glucose: 108

## 2024-02-16 ENCOUNTER — Ambulatory Visit (INDEPENDENT_AMBULATORY_CARE_PROVIDER_SITE_OTHER): Admitting: Family Medicine

## 2024-02-20 ENCOUNTER — Telehealth: Payer: Self-pay

## 2024-02-20 NOTE — Telephone Encounter (Signed)
 Copied from CRM [id number]. Topic: Referral - Question >> Feb 20, 2024  3:23 PM Ashley R wrote: Reason for CRM: Needs another scan done to compare for module found in last one. CTLD lung screening baseline, [facility] program at work for no charge. Would like referral sent to them.

## 2024-02-20 NOTE — Telephone Encounter (Signed)
 No further action needed at this time. Chart verified with information provided by pt

## 2024-02-23 ENCOUNTER — Encounter (HOSPITAL_COMMUNITY): Payer: Self-pay

## 2024-02-23 ENCOUNTER — Telehealth (HOSPITAL_COMMUNITY): Payer: Self-pay

## 2024-02-23 ENCOUNTER — Encounter (INDEPENDENT_AMBULATORY_CARE_PROVIDER_SITE_OTHER): Payer: Self-pay

## 2024-02-23 ENCOUNTER — Encounter (INDEPENDENT_AMBULATORY_CARE_PROVIDER_SITE_OTHER): Payer: Self-pay | Admitting: Family Medicine

## 2024-02-23 ENCOUNTER — Ambulatory Visit (INDEPENDENT_AMBULATORY_CARE_PROVIDER_SITE_OTHER): Admitting: Family Medicine

## 2024-02-23 ENCOUNTER — Other Ambulatory Visit (HOSPITAL_COMMUNITY): Payer: Self-pay

## 2024-02-23 DIAGNOSIS — E669 Obesity, unspecified: Secondary | ICD-10-CM | POA: Diagnosis not present

## 2024-02-23 DIAGNOSIS — I610 Nontraumatic intracerebral hemorrhage in hemisphere, subcortical: Secondary | ICD-10-CM

## 2024-02-23 DIAGNOSIS — Z6829 Body mass index (BMI) 29.0-29.9, adult: Secondary | ICD-10-CM | POA: Diagnosis not present

## 2024-02-23 MED ORDER — SEMAGLUTIDE-WEIGHT MANAGEMENT 1 MG/0.5ML ~~LOC~~ SOAJ
1.0000 mg | SUBCUTANEOUS | 1 refills | Status: DC
  Filled 2024-02-23 (×2): qty 2, 28d supply, fill #0
  Filled 2024-03-21: qty 2, 28d supply, fill #1

## 2024-02-23 NOTE — Progress Notes (Signed)
 [name], D.O.  ABFM, ABOM Specializing in Clinical Bariatric Medicine  Office located at: [address] [place], [place]  [address]      A) FOR THE CHRONIC DISEASE OF OBESITY:  Chief complaint: Obesity Andrea Bridges is here to discuss her progress with her obesity treatment plan.   History of present illness / Interval history:  Andrea Bridges is here today for her follow-up office visit.  Since last OV on 10/19/23, pt is down 1 lb. Pt states has had a couple of difficult months. She has a brain bleed and is recovering.    10/19/23 11:00   Body Fat % 39.9 %  Muscle Mass (lbs) 109.2 lbs  Fat Mass (lbs) 76.2 lbs  Total Body Water (lbs) 78.2 lbs  Visceral Fat Rating  10    02/23/24 12:00   Body Fat % 41.8 %  Muscle Mass (lbs) 105 lbs  Fat Mass (lbs) 79.6 lbs  Total Body Water (lbs) 78.6 lbs  Visceral Fat Rating  11   Counseling done on how various foods will affect these numbers and how to maximize success   Total lbs lost to date: - 20 lbs Total Fat Mass in lbs lost to date: - 0.8 lbs Total weight loss percentage to date: - 9.52 %    Obesity, Beginning BMI 32.89 BMI 29.0-29.9,adult -- Current BMI 29.75 Nutrition Therapy She is on the Category 1 Plan and states she is following her eating plan approximately 40 % of the time.   - Tracking Calories/Macros: no   - Eating More Whole Foods: yes  - Adequate Protein Intake: no   - Adequate Water Intake: no   - Skipping Meals: no   - Sleeping 7-9 Hours/ Night: no    Andrea Bridges is currently in the action stage of change. As such, her goal is to continue weight management plan.  She has agreed to: continue current plan   Physical Activity Andrea Bridges is not exercising due to being sick and having a brain bleed   Andrea Bridges has been advised to work up to 300-450 minutes of moderate intensity aerobic activity a week and strengthening exercises 2-3 times per week for cardiovascular health, weight loss maintenance and  preservation of muscle mass.  She has agreed to : Increase physical activity in their day and reduce sedentary time (increase NEAT)., Increase and monitor steps for a goal of 10,000 per day, and Increase volume of physical activity to a goal of 240 minutes a week   Behavioral Modifications Evidence-based interventions for health behavior change were utilized today including the discussion of  1) self monitoring techniques:    2) problem-solving barriers:    3) self care:    4) SMART goals for next OV:    Regarding patient's less desirable eating habits and patterns, we employed the technique of small changes.   We discussed the following today: increasing lean protein intake to established goals, avoiding skipping meals, increasing water intake , and work on managing stress, creating time for self-care and relaxation Additional resources provided today: Handout on CAT 1 meal plan , Handout on CAT 1-2 breakfast options, and Handout on CAT 1-2 lunch options   Medical Interventions/ Pharmacotherapy Previous Bariatric surgery: n/a Pharmacotherapy for weight loss: She is not currently taking medications  for medical weight loss.    We discussed various medication options to help Andrea Bridges with her weight loss efforts and we both agreed to : Continue with current nutritional and behavioral strategies  Pt  states that she has not currently been taking her Wegovy  due to her brain bleed. She tried restarting her Wegovy  1.7 mg and was throwing up and had GI upset last week. This week she has not taken any of her medication due to fear of having side effects again. Mutually agreed to Restart Wegovy  1 mg. Continue to follow prudent meal plan.    B) OBESITY RELATED CONDITIONS ADDRESSED TODAY:  Nontraumatic subcortical hemorrhage of left cerebral hemisphere Andrea Bridges) Assessment & Plan Pt has a nontraumatic hemorrhage of left cerebral hemisphere on 11/20/23. Pt states that she was in the ICU for 2 weeks. She  reports that she has no memory of the time that this event occurred.She endorses that she had no symptoms just a really bad headache. She has recently returned to work this is her second week of work full time. She has had no SE and no permanent damage. Continue following up with specialist. Will monitor alongside.     Medications Discontinued During This Encounter  Medication Reason   semaglutide -weight management (WEGOVY ) 0.25 MG/0.5ML SOAJ SQ injection    semaglutide -weight management (WEGOVY ) 1.7 MG/0.75ML SOAJ SQ injection      Meds ordered this encounter  Medications   semaglutide -weight management (WEGOVY ) 1 MG/0.5ML SOAJ SQ injection    Sig: Inject 1 mg into the skin once a week.    Dispense:  2 mL    Refill:  1      Follow up:   Return 03/21/2024 at 12:00 PM  She was informed of the importance of frequent follow up visits to maximize her success with intensive lifestyle modifications for her multiple health conditions.   Weight Summary and Biometrics   Weight Lost Since Last Visit: 1lb  Weight Gained Since Last Visit: 0lb    Vitals Temp: 97.9 F (36.6 C) BP: 100/68 Pulse Rate: 73 SpO2: 99 %   Anthropometric Measurements Height: 5' 7 (1.702 m) Weight: 190 lb (86.2 kg) BMI (Calculated): 29.75 Weight at Last Visit: 191lb Weight Lost Since Last Visit: 1lb Weight Gained Since Last Visit: 0lb Starting Weight: 210lb Total Weight Loss (lbs): 20 lb (9.072 kg)   Body Composition  Body Fat %: 41.8 % Fat Mass (lbs): 79.6 lbs Muscle Mass (lbs): 105 lbs Total Body Water (lbs): 78.6 lbs Visceral Fat Rating : 11   Other Clinical Data Fasting: no Labs: no Today's Visit #: 17 Starting Date: 09/08/22    Objective:   PHYSICAL EXAM: Blood pressure 100/68, pulse 73, temperature 97.9 F (36.6 C), height 5' 7 (1.702 m), weight 190 lb (86.2 kg), SpO2 99%. Body mass index is 29.76 kg/m.  General: she is overweight, cooperative and in no acute  distress. PSYCH: Has normal mood, affect and thought process.   HEENT: EOMI, sclerae are anicteric. Lungs: Normal breathing effort, no conversational dyspnea. Extremities: Moves * 4 Neurologic: A and O * 3, good insight  DIAGNOSTIC DATA REVIEWED: BMET    Component Value Date/Time   NA 137 12/20/2023 1404   NA 139 03/17/2023 0936   K 3.8 12/20/2023 1404   CL 101 12/20/2023 1404   CO2 25 12/20/2023 1404   GLUCOSE 96 12/20/2023 1404   BUN 13 12/20/2023 1404   BUN 21 03/17/2023 0936   CREATININE 0.67 12/20/2023 1404   CALCIUM 9.5 12/20/2023 1404   GFRNONAA >60 12/20/2023 1404   Lab Results  Component Value Date   HGBA1C 5.5 07/25/2023   HGBA1C 5.5 09/08/2022   Lab Results  Component Value Date  INSULIN  11.3 07/25/2023   INSULIN  9.3 09/08/2022   Lab Results  Component Value Date   TSH 1.66 09/22/2023   CBC    Component Value Date/Time   WBC 6.4 12/21/2023 0451   RBC 4.10 12/21/2023 0451   Andrea Bridges 11.4 (L) 12/21/2023 0451   Andrea Bridges 13.5 09/08/2022 0854   HCT 35.3 (L) 12/21/2023 0451   HCT 43.1 09/08/2022 0854   PLT 171 12/21/2023 0451   PLT 219 09/08/2022 0854   MCV 86.1 12/21/2023 0451   MCV 84 09/08/2022 0854   MCH 27.8 12/21/2023 0451   MCHC 32.3 12/21/2023 0451   RDW 15.0 12/21/2023 0451   RDW 14.7 09/08/2022 0854   Iron Studies No results found for: IRON, TIBC, FERRITIN, IRONPCTSAT Lipid Panel     Component Value Date/Time   CHOL 250 (H) 07/25/2023 1023   TRIG 82 11/30/2023 1811   HDL 82 07/25/2023 1023   CHOLHDL 3.0 07/25/2023 1023   LDLCALC 151 (H) 07/25/2023 1023   Hepatic Function Panel     Component Value Date/Time   PROT 6.1 (L) 12/12/2023 0523   PROT 7.0 03/17/2023 0936   ALBUMIN 3.0 (L) 12/12/2023 0523   ALBUMIN 4.6 03/17/2023 0936   AST 30 12/12/2023 0523   ALT 36 12/12/2023 0523   ALKPHOS 65 12/12/2023 0523   BILITOT 0.6 12/12/2023 0523   BILITOT 0.3 03/17/2023 0936      Component Value Date/Time   TSH 1.66 09/22/2023 0807    Nutritional Lab Results  Component Value Date   VD25OH 24.5 (L) 07/25/2023   VD25OH 28.0 (L) 03/17/2023   VD25OH 20.9 (L) 09/08/2022    Attestations:   LILLETTE Sonny Laroche, acting as a Stage manager for Barnie Jenkins, DO., have compiled all relevant documentation for today's office visit on behalf of Barnie Jenkins, DO, while in the presence of [employer], DO.    I have reviewed the above documentation for accuracy and completeness, and I agree with the above. Barnie JINNY Jenkins, D.O.  The 21st Century Cures Act was signed into law in 2016 which includes the topic of electronic health records.  This provides immediate access to information in MyChart.  This includes consultation notes, operative notes, office notes, lab results and pathology reports.  If you have any questions about what you read please let us  know at your next visit so we can discuss your concerns and take corrective action if need be.  We are right here with you.

## 2024-02-23 NOTE — Telephone Encounter (Signed)
 PA request has been Received. New Encounter has been or will be created for follow up. For additional info see Pharmacy Prior Auth telephone encounter from 02/23/24.

## 2024-02-23 NOTE — Telephone Encounter (Signed)
 Pharmacy Patient Advocate Encounter  Received notification from [facility] that Prior Authorization for Wegovy  1MG /0.5ML auto-injectors  has been APPROVED from 02/23/24 to 02/22/25. Ran test claim, Copay is $0. This test claim was processed through [facility]- copay amounts may vary at other pharmacies due to pharmacy/plan contracts, or as the patient moves through the different stages of their insurance plan.   PA #/Case ID/Reference #: [id number]

## 2024-02-27 ENCOUNTER — Telehealth: Payer: Self-pay

## 2024-02-27 NOTE — Telephone Encounter (Signed)
 Copied from CRM [id number]. Topic: Referral - Question >> Feb 20, 2024  3:23 PM Ashley R wrote: Reason for CRM: Needs another scan done to compare for module found in last one. CTLD lung screening baseline, [facility] program at work for no charge. Would like referral sent to them. >> Feb 27, 2024 12:29 PM Macario HERO wrote: Patient called to see if we received a referral request from [facility] program.

## 2024-03-12 ENCOUNTER — Ambulatory Visit (INDEPENDENT_AMBULATORY_CARE_PROVIDER_SITE_OTHER)

## 2024-03-12 ENCOUNTER — Telehealth: Payer: Self-pay

## 2024-03-12 DIAGNOSIS — Z23 Encounter for immunization: Secondary | ICD-10-CM | POA: Diagnosis not present

## 2024-03-12 NOTE — Telephone Encounter (Signed)
 Copied from CRM [id number]. Topic: Clinical - Request for Lab/Test Order >> Mar 12, 2024  3:05 PM Viola FALCON wrote: Reason for CRM: Olam from [facility] received fax of patients most recent CT order from May 2025 but she's requesting that a new orders can be put in and faxed to her at  [phone]. Her call back number is [phone]

## 2024-03-12 NOTE — Telephone Encounter (Signed)
 Faxed

## 2024-03-12 NOTE — Telephone Encounter (Signed)
 Pt has not completed CT yet

## 2024-03-12 NOTE — Telephone Encounter (Signed)
 Do we need to do a new order for the location to be changed to [facility]

## 2024-03-12 NOTE — Telephone Encounter (Signed)
 Reason for CRM: Olam with [facility] called in stating that she would like a copy of the patient's chest CT order to be faxed to her at [phone]. Callback number [phone].

## 2024-03-13 NOTE — Addendum Note (Signed)
 Addended by: CLAUDENE SHANDA ORN on: 03/13/2024 03:09 PM   Modules accepted: Orders

## 2024-03-13 NOTE — Telephone Encounter (Signed)
 Okay to place the order wherever patient states she needs it placed

## 2024-03-13 NOTE — Telephone Encounter (Unsigned)
 Copied from CRM #[id number]. Topic: Clinical - Request for Lab/Test Order >> Mar 12, 2024  4:45 PM Dedra B wrote: Reason for CRM: Pt called to request order for CT lung be faxed to [facility] program at [phone].

## 2024-03-13 NOTE — Telephone Encounter (Signed)
Order updated and faxed.

## 2024-03-13 NOTE — Telephone Encounter (Signed)
 Please advise if okay to place new order to be sent externally

## 2024-03-14 NOTE — Telephone Encounter (Signed)
 Copied from CRM [id number]. Topic: Clinical - Request for Lab/Test Order >> Mar 14, 2024  9:04 AM Berneda FALCON wrote: Reason for CRM: Olam from [facility] is requesting the STAT orders for the CT scan be sent over to her ASAP please.  States they did get one, but it was dated from May and she is requesting the one from yesterday (03/13/24) be faxed please as this is a STAT order and they want to schedule patient ASAP.  [phone] Phone number ([phone]

## 2024-03-14 NOTE — Telephone Encounter (Signed)
 Order refaxed

## 2024-03-16 NOTE — Telephone Encounter (Signed)
 Pt confirms that [facility] [place]

## 2024-03-16 NOTE — Telephone Encounter (Signed)
 MyChart message sent to patient. Pulmonary nodule follow-up lung scan shows benign appearing lung nodules.  Recommendations are now to follow-up yearly on the nodules.

## 2024-03-16 NOTE — Addendum Note (Signed)
 Addended by: Josephyne Tarter A on: 03/16/2024 03:59 PM   Modules accepted: Orders

## 2024-03-21 ENCOUNTER — Encounter (INDEPENDENT_AMBULATORY_CARE_PROVIDER_SITE_OTHER): Payer: Self-pay | Admitting: Family Medicine

## 2024-03-21 ENCOUNTER — Ambulatory Visit (INDEPENDENT_AMBULATORY_CARE_PROVIDER_SITE_OTHER): Payer: Self-pay | Admitting: Family Medicine

## 2024-03-21 DIAGNOSIS — F5089 Other specified eating disorder: Secondary | ICD-10-CM

## 2024-03-21 DIAGNOSIS — Z6829 Body mass index (BMI) 29.0-29.9, adult: Secondary | ICD-10-CM | POA: Diagnosis not present

## 2024-03-21 DIAGNOSIS — E88819 Insulin resistance, unspecified: Secondary | ICD-10-CM

## 2024-03-21 DIAGNOSIS — E669 Obesity, unspecified: Secondary | ICD-10-CM

## 2024-03-21 DIAGNOSIS — E559 Vitamin D deficiency, unspecified: Secondary | ICD-10-CM

## 2024-03-21 MED ORDER — SEMAGLUTIDE-WEIGHT MANAGEMENT 1 MG/0.5ML ~~LOC~~ SOAJ
1.0000 mg | SUBCUTANEOUS | 0 refills | Status: DC
  Filled 2024-04-19: qty 2, 28d supply, fill #0

## 2024-03-21 NOTE — Progress Notes (Signed)
 Andrea DOROTHA Bridges, D.O.  ABFM, ABOM Specializing in Clinical Bariatric Medicine  Office located at: [address] [place], [place]  [address]    FOR THE CHRONIC DISEASE OF OBESITY:   Obesity, Beginning BMI 32.89 BMI 29.0-29.9,adult -- Current BMI 29.28  Weight Summary and Body Composition Analysis  Weight Lost Since Last Visit: 3lb  Weight Gained Since Last Visit: 0lb    Vitals Temp: (!) 97.5 F (36.4 C) BP: 104/67 Pulse Rate: 78 SpO2: 97 %   Anthropometric Measurements Height: 5' 7 (1.702 m) Weight: 187 lb (84.8 kg) BMI (Calculated): 29.28 Weight at Last Visit: 190lb Weight Lost Since Last Visit: 3lb Weight Gained Since Last Visit: 0lb Starting Weight: 210lb Total Weight Loss (lbs): 23 lb (10.4 kg)   Body Composition  Body Fat %: 40.3 % Fat Mass (lbs): 75.4 lbs Muscle Mass (lbs): 106 lbs Total Body Water (lbs): 76 lbs Visceral Fat Rating : 10   Other Clinical Data Fasting: no Labs: no Today's Visit #: 18 Starting Date: 09/08/22    Chief complaint: Obesity  Interval History Andrea Bridges is here for a follow-up office visit to discuss her progress with her obesity treatment plan. She is on the Category 1 Plan and states she is following her eating plan approximately 40 % of the time. She is not exercising.  She has experienced a weight loss of 3 lbs since last OV on 02/23/2024.   Her dietary and life habits include:  - Tracking Calories/Macros: not on a tracking plan  - Eating More Whole Foods: yes  - Adequate Protein Intake: yes  - Adequate Water Intake: no  - Skipping Meals: no  - Sleeping 7-9 Hours/ Night: yes    02/23/24 12:00 03/21/24 11:00   Body Fat % 41.8 % 40.3 %  Muscle Mass (lbs) 105 lbs 106 lbs  Fat Mass (lbs) 79.6 lbs 75.4 lbs  Total Body Water (lbs) 78.6 lbs 76 lbs  Visceral Fat Rating  11 10   Counseling done on how various foods will affect these numbers and how to maximize success  Total Fat mass lost to date:   - 14.4 lbs Total lbs lost to date: - 23 lbs Total weight loss percentage to date: - 10.95 %   Nutritional and Behavioral Counseling:  We discussed the following today: continue to work on maintaining a reduced calorie state, getting the recommended amount of protein, incorporating whole foods, making healthy choices, staying well hydrated and practicing mindfulness when eating.  Additional resources provided today: Handout on NEAT  Evidence-based interventions for health behavior change were utilized today including the discussion of self monitoring techniques, problem-solving barriers and SMART goal setting techniques.   Regarding patient's less desirable eating habits and patterns, we employed the technique of small changes.   SMART Goal(s) created today: n/a   Recommended Dietary Goals Andrea Bridges is currently in the action stage of change. As such, her goal is to continue weight management plan.  She has agreed to continue the CAT 1 MP.   Recommended Physical Activity Goals Andrea Bridges has been advised to work up to 300-450 minutes of moderate intensity aerobic activity a week and strengthening exercises 2-3 times per week for cardiovascular health, weight loss maintenance and preservation of muscle mass.   She has agreed to Increase physical activity in their day and reduce sedentary time (increase NEAT).   Medical Interventions and Pharmacotherapy Previous Bariatric surgery: n/a Pharmacotherapy: Was not able to tolerate Wegovy  1.7 mg d/t terrible GI distress.  She is currently on Wegovy  1 mg wkly with good compliance and tolerance. Good control of hunger, cravings, and food noise. Cont regimen (refill today).   OBESITY RELATED CONDITIONS ADDRESSED TODAY:    Insulin  resistance Assessment & Plan: Lab Results  Component Value Date   HGBA1C 5.6 02/15/2024   HGBA1C 5.5 07/25/2023   HGBA1C 5.6 03/17/2023   INSULIN  11.3 07/25/2023   INSULIN  11.0 03/17/2023   INSULIN  9.3 09/08/2022    I.R managed with dietary and lifestyle interventions. No complaints of excessive hunger or cravings. No acute concerns. Continue working on nutrition plan to decrease simple carbohydrates, increase lean proteins and exercise to promote weight loss and prevent progression to Pre-DM.     Vitamin D  deficiency Assessment & Plan: Lab Results  Component Value Date   VD25OH 24.5 (L) 07/25/2023   VD25OH 28.0 (L) 03/17/2023   VD25OH 20.9 (L) 09/08/2022   She is unsure whether she is taking OTC vit D 5,000 units once weekly or vit D 50,000 units once weekly. Recommended that she contact the prescribing provider to verify the correct dosage.    Objective:   PHYSICAL EXAM: Blood pressure 104/67, pulse 78, temperature (!) 97.5 F (36.4 C), height 5' 7 (1.702 m), weight 187 lb (84.8 kg), SpO2 97%. Body mass index is 29.29 kg/m.  General: she is overweight, cooperative and in no acute distress. PSYCH: Has normal mood, affect and thought process.   HEENT: EOMI, sclerae are anicteric. Lungs: Normal breathing effort, no conversational dyspnea. Extremities: Moves * 4 Neurologic: A and O * 3, good insight  DIAGNOSTIC DATA REVIEWED: BMET    Component Value Date/Time   NA 137 12/20/2023 1404   NA 139 03/17/2023 0936   K 3.8 12/20/2023 1404   CL 101 12/20/2023 1404   CO2 25 12/20/2023 1404   GLUCOSE 96 12/20/2023 1404   BUN 13 12/20/2023 1404   BUN 21 03/17/2023 0936   CREATININE 0.67 12/20/2023 1404   CALCIUM 9.5 12/20/2023 1404   GFRNONAA >60 12/20/2023 1404   Lab Results  Component Value Date   HGBA1C 5.6 02/15/2024   HGBA1C 5.5 09/08/2022   Lab Results  Component Value Date   INSULIN  11.3 07/25/2023   INSULIN  9.3 09/08/2022   Lab Results  Component Value Date   TSH 1.66 09/22/2023   CBC    Component Value Date/Time   WBC 6.4 12/21/2023 0451   RBC 4.10 12/21/2023 0451   HGB 11.4 (L) 12/21/2023 0451   HGB 13.5 09/08/2022 0854   HCT 35.3 (L) 12/21/2023 0451   HCT  43.1 09/08/2022 0854   PLT 171 12/21/2023 0451   PLT 219 09/08/2022 0854   MCV 86.1 12/21/2023 0451   MCV 84 09/08/2022 0854   MCH 27.8 12/21/2023 0451   MCHC 32.3 12/21/2023 0451   RDW 15.0 12/21/2023 0451   RDW 14.7 09/08/2022 0854   Iron Studies No results found for: IRON, TIBC, FERRITIN, IRONPCTSAT Lipid Panel     Component Value Date/Time   CHOL 256 (A) 02/15/2024 0000   CHOL 250 (H) 07/25/2023 1023   TRIG 68 02/15/2024 0000   HDL 97 (A) 02/15/2024 0000   HDL 82 07/25/2023 1023   CHOLHDL 3.0 07/25/2023 1023   LDLCALC 146 02/15/2024 0000   LDLCALC 151 (H) 07/25/2023 1023   Hepatic Function Panel     Component Value Date/Time   PROT 6.1 (L) 12/12/2023 0523   PROT 7.0 03/17/2023 0936   ALBUMIN 3.0 (L) 12/12/2023 0523   ALBUMIN  4.6 03/17/2023 0936   AST 30 12/12/2023 0523   ALT 36 12/12/2023 0523   ALKPHOS 65 12/12/2023 0523   BILITOT 0.6 12/12/2023 0523   BILITOT 0.3 03/17/2023 0936      Component Value Date/Time   TSH 1.66 09/22/2023 0807   Nutritional Lab Results  Component Value Date   VD25OH 24.5 (L) 07/25/2023   VD25OH 28.0 (L) 03/17/2023   VD25OH 20.9 (L) 09/08/2022    Follow up:   Return 04/25/2024 at 12:20 PM. She was informed of the importance of frequent follow up visits to maximize her success with intensive lifestyle modifications for her multiple health conditions.   Attestations:   I, Special Puri, acting as a stage manager for [employer], DO., have compiled all relevant documentation for today's office visit on behalf of Andrea Jenkins, DO, while in the presence of [employer], DO.  Pertinent positives were addressed with patient today. Reviewed by clinician on day of visit: allergies, medications, problem list, medical history, surgical history, family history, social history, and previous encounter notes.  I have reviewed the above documentation for accuracy and completeness, and I agree with the above. Andrea Bridges, D.O.  The 21st Century Cures Act was signed into law in 2016 which includes the topic of electronic health records.  This provides immediate access to information in MyChart. This includes consultation notes, operative notes, office notes, lab results and pathology reports.  If you have any questions about what you read please let us  know at your next visit so we can discuss your concerns and take corrective action if need be.  We are right here with you.

## 2024-04-18 ENCOUNTER — Other Ambulatory Visit (HOSPITAL_COMMUNITY): Payer: Self-pay

## 2024-04-18 ENCOUNTER — Other Ambulatory Visit (INDEPENDENT_AMBULATORY_CARE_PROVIDER_SITE_OTHER): Payer: Self-pay | Admitting: Family Medicine

## 2024-04-19 ENCOUNTER — Other Ambulatory Visit (HOSPITAL_COMMUNITY): Payer: Self-pay

## 2024-04-19 ENCOUNTER — Telehealth (HOSPITAL_COMMUNITY): Payer: Self-pay | Admitting: Pharmacy Technician

## 2024-04-19 ENCOUNTER — Encounter (HOSPITAL_COMMUNITY): Payer: Self-pay

## 2024-04-19 ENCOUNTER — Telehealth (HOSPITAL_COMMUNITY): Payer: Self-pay

## 2024-04-19 NOTE — Telephone Encounter (Signed)
 Pharmacy Patient Advocate Encounter   Received notification from Pt Calls Messages that prior authorization for Wegovy  1 mg/0.5 ml auto injectors is required/requested.   Insurance verification completed.   The patient is insured through [employer].   Per test claim: Insurance will only pay for 4 boxes of the 1 mg strength in a rolling 180 day period. I spoke with the Pharmacist and typically patient is increased to 1.7 mg which is a maintenance dose after 4 weeks of being on the 1 mg dose. Is there a medical reason why she can't or shouldn't be increased to the 1.7 mg dose?

## 2024-04-19 NOTE — Telephone Encounter (Signed)
 PA request has been Received. New Encounter has been or will be created for follow up. For additional info see Pharmacy Prior Auth telephone encounter from 04/19/24.

## 2024-04-20 ENCOUNTER — Other Ambulatory Visit (HOSPITAL_COMMUNITY): Payer: Self-pay

## 2024-04-25 ENCOUNTER — Ambulatory Visit (INDEPENDENT_AMBULATORY_CARE_PROVIDER_SITE_OTHER): Admitting: Family Medicine

## 2024-04-26 ENCOUNTER — Other Ambulatory Visit: Payer: Self-pay | Admitting: Obstetrics and Gynecology

## 2024-04-26 ENCOUNTER — Encounter: Payer: Self-pay | Admitting: Obstetrics and Gynecology

## 2024-04-26 DIAGNOSIS — N6321 Unspecified lump in the left breast, upper outer quadrant: Secondary | ICD-10-CM

## 2024-05-01 ENCOUNTER — Telehealth (INDEPENDENT_AMBULATORY_CARE_PROVIDER_SITE_OTHER): Payer: Self-pay | Admitting: Family Medicine

## 2024-05-01 ENCOUNTER — Other Ambulatory Visit (HOSPITAL_COMMUNITY): Payer: Self-pay

## 2024-05-01 NOTE — Telephone Encounter (Signed)
 Patient called and would like to know what is going with her prescription. The insurance information we have on file is not allowing to complete the PA. Called patient and she will send me the updated information tomorrow via Mychart for completion.

## 2024-05-02 ENCOUNTER — Other Ambulatory Visit (HOSPITAL_COMMUNITY): Payer: Self-pay

## 2024-05-02 NOTE — Telephone Encounter (Signed)
 Pharmacy Patient Advocate Encounter   Received notification from Pt Calls Messages that prior authorization for Wegovy  1 mg/0.5 ml auto injectors  is required/requested.   Insurance verification completed.   The patient is insured through [employer].   Per test claim: PA required; PA submitted to above mentioned insurance via Fax Key/confirmation #/EOC -- Status is pending *faxed to [phone] *I was having trouble submitting on CMM. Called the insurance company and they faxed me a form to fill out. I have done that and faxed to them today.

## 2024-05-02 NOTE — Telephone Encounter (Signed)
 Faxed all the information to them via fax yesterday and was told to call back to finish today because patient only needed clarification for why she needed to stay at the 1 mg dose. All information was documented in provider's notes from last visit and faxed the last OV note to [phone] via epic fax.

## 2024-05-05 ENCOUNTER — Other Ambulatory Visit (HOSPITAL_COMMUNITY): Payer: Self-pay

## 2024-05-08 ENCOUNTER — Telehealth (INDEPENDENT_AMBULATORY_CARE_PROVIDER_SITE_OTHER): Payer: Self-pay

## 2024-05-08 NOTE — Telephone Encounter (Signed)
 PA for Wegovy  1MG  was approved from 05/01/2024 until 02/22/2025

## 2024-05-09 NOTE — Telephone Encounter (Signed)
 Medication was approved, please see next encounter for duplicate documentation

## 2024-05-23 ENCOUNTER — Ambulatory Visit
Admission: RE | Admit: 2024-05-23 | Discharge: 2024-05-23 | Disposition: A | Discharge status: Home / Self Care | Source: Ambulatory Visit | Attending: Obstetrics and Gynecology | Admitting: Obstetrics and Gynecology

## 2024-05-23 DIAGNOSIS — N6321 Unspecified lump in the left breast, upper outer quadrant: Secondary | ICD-10-CM

## 2024-05-28 ENCOUNTER — Other Ambulatory Visit (HOSPITAL_COMMUNITY): Payer: Self-pay

## 2024-05-28 ENCOUNTER — Encounter (INDEPENDENT_AMBULATORY_CARE_PROVIDER_SITE_OTHER): Payer: Self-pay | Admitting: Family Medicine

## 2024-05-28 ENCOUNTER — Ambulatory Visit (INDEPENDENT_AMBULATORY_CARE_PROVIDER_SITE_OTHER): Payer: Self-pay | Admitting: Family Medicine

## 2024-05-28 DIAGNOSIS — E88819 Insulin resistance, unspecified: Secondary | ICD-10-CM | POA: Diagnosis not present

## 2024-05-28 DIAGNOSIS — E559 Vitamin D deficiency, unspecified: Secondary | ICD-10-CM

## 2024-05-28 DIAGNOSIS — Z6829 Body mass index (BMI) 29.0-29.9, adult: Secondary | ICD-10-CM

## 2024-05-28 MED ORDER — ZEPBOUND 5 MG/0.5ML ~~LOC~~ SOAJ
5.0000 mg | SUBCUTANEOUS | 0 refills | Status: AC
  Filled 2024-05-28: qty 2, 28d supply, fill #0

## 2024-05-28 NOTE — Progress Notes (Addendum)
 "  Andrea Bridges, D.O.  ABFM, ABOM Specializing in Clinical Bariatric Medicine  Office located at: [address] [place], [place]  [address]         Medications Discontinued During This Encounter  Medication Reason   semaglutide -weight management (WEGOVY ) 1 MG/0.5ML SOAJ SQ injection      Meds ordered this encounter  Medications   tirzepatide  (ZEPBOUND ) 5 MG/0.5ML Pen    Sig: Inject 5 mg into the skin once a week.    Dispense:  2 mL    Refill:  0      A) FOR THE CHRONIC DISEASE OF OBESITY:  Morbid obesity (HCC) BMI 29.0-29.9,adult -- Current BMI 29.75  Chief complaint: Obesity Andrea Bridges is here to discuss her progress with her obesity treatment plan.   History of present illness / Interval history:  Andrea Bridges is here today for her follow-up office visit.  Since last OV on 03/21/2024 with provider: Dr. Jenkins, patient is up 3 lbs.   variable adherence to reduced calorie nutritional plan  Pt has been struggling with:  []  meal planning and prepping []  exercise [x]  sleep [x]  Stressors- work stress []  mood []  chronic or acute medical conditions []  eating out more   Pt has been working on and improving their:  []  meal planning and prepping []  exercise []  Sleep []  water intake []  strategies to better manage personal stressors []  strategies to better manage mood []  eating out less [x]  Nothing specifically   When asked by CMA prior to our OV, pt states they have been:  - Focused on eating fresh, unprocessed foods?  Yes   - Focused on eating lean proteins with each meal?  No - Sleeping 7-9 Hours/ Night? No - Skipping Meals? Yes - States she is following her healthy eating plan approximately 20 % of the time.   Recent weight loss data history  03/21/24 11:00 05/28/24 12:00   Body Fat % 40.3 % 70.9 %  Muscle Mass (lbs) 106 lbs 106.8 lbs  Fat Mass (lbs) 75.4 lbs 78 lbs  Total Body Water (lbs) 76 lbs 77.6 lbs  Visceral Fat Rating  10 11  Counseling  done on how various foods/ behaviors will affect these numbers and how to maximize weight loss success discussed today in detail based on these findings. Total lbs lost to date: -20 lbs Total Fat Mass in lbs lost to date: +2.6 lbs Total weight loss percentage to date since starting program:  -9.52 %   Physician directed Nutrition Therapy prescription: She is on the Category 1 Plan.    Andrea Bridges is currently in the action stage of change. As such, her goal is to restart meal plan and get back to basics.    She has agreed to: restart meal plan   Physician directed Behavioral Modification prescription: Evidence-based interventions for healthy behavior change were utilized today including the discussion of small changes and SMART goals.   SPECIFIC SMART goals for next OV:  Start walking  Regarding patient's less desirable eating habits and patterns, we employed the technique of small changes.  For next OV, pt will focus on avoid skipping meals and increase lean proteins  We discussed the following today: increasing lean protein intake to established goals, avoiding skipping meals, and continue to work on implementation of reduced calorie nutritional plan   Physician directed Physical Activity prescription: Pt is not exercising.  barriers to successful adherence to exercise for wt loss:  work stress  Andrea Bridges has been advised  to start slow and start walking.    She has agreed to : Think about enjoyable ways to increase daily physical activity and overcoming barriers to exercise and Increase physical activity in their day and reduce sedentary time (increase NEAT).    Medical Interventions/ Pharmacotherapy Previous Bariatric surgery: none   Previously tried medical weight loss medications/ therapies:    Wegovy  1.7 mg, Metformin  Discontinued Wegovy  1.7 mg once weekly due to terrible GI upset and was unable to tolerate medication. Reduced to Wegovy  1 mg- still having  troubles  Pharmacotherapy for weight loss: She is currently taking Wegovy  1  mg weekly for medical weight loss. Pt is using [employer] weight loss drug company. Started Wegovy  on 02/23/2024. Reports she experiences heart burn on medication and GI upset. Because she is experiencing side effects, after an extensive discussion of different medications to help promote weight loss. We decided to change from wegovy  1mg  --->  start Zepbound  5 mg once weekly. Hoping this medication is more tolerable, results in less GI upset, and promotes more weight loss. Pt is aware of risks and benefits of medication. Encouraged pt to focus on whole foods and lean proteins to help subside side effects.    We discussed various medication options to help Andrea Bridges with her weight loss efforts and we both agreed to:  Discontinue Wegovy  1mg  once weekly due to lack of therapeutic benefit, Nutritional guidance was provided to help minimize the risk of exacerbating GLP-1 related side effects, and Start Zepbound  5 mg once weekly     B) OBESITY RELATED CONDITIONS ADDRESSED TODAY:  Insulin  resistance Assessment & Plan Lab Results  Component Value Date   HGBA1C 5.6 02/15/2024   HGBA1C 5.5 07/25/2023   HGBA1C 5.6 03/17/2023   INSULIN  11.3 07/25/2023   INSULIN  11.0 03/17/2023   INSULIN  9.3 09/08/2022  Currently managed with dietary and lifestyle interventions. Hunger and cravings are well controlled. No acute concerns. Cont to decrease simples carbs/sugars and increase lean proteins to promote weight loss and prevent progression to Pre-DM. Increase exercise as able.     Vitamin D  deficiency Assessment & Plan Lab Results  Component Value Date   VD25OH 24.5 (L) 07/25/2023   VD25OH 28.0 (L) 03/17/2023   VD25OH 20.9 (L) 09/08/2022  Currently taking OTC Vit D. Vit D levels are below goal at 24.5; optimal bt 50-70. Cont regimen. Will recheck as necessary.    Medications Discontinued During This Encounter  Medication Reason    semaglutide -weight management (WEGOVY ) 1 MG/0.5ML SOAJ SQ injection      Meds ordered this encounter  Medications   tirzepatide  (ZEPBOUND ) 5 MG/0.5ML Pen    Sig: Inject 5 mg into the skin once a week.    Dispense:  2 mL    Refill:  0       Follow up:   Return in about 4 weeks (around 06/25/2024).  She was informed of the importance of frequent follow up visits to maximize her success with intensive lifestyle modifications for her multiple health conditions.    Weight Summary and Biometrics   Weight Lost Since Last Visit: 0 lb  Weight Gained Since Last Visit: 3 lb    Vitals Temp: (!) 97.1 F (36.2 C) BP: 117/76 Pulse Rate: 70 SpO2: 99 %   Anthropometric Measurements Height: 5' 7 (1.702 m) Weight: 190 lb (86.2 kg) BMI (Calculated): 29.75 Weight at Last Visit: 187 lb Weight Lost Since Last Visit: 0 lb Weight Gained Since Last Visit: 3 lb Starting Weight: 210  lb Total Weight Loss (lbs): 20 lb (9.072 kg)   Body Composition  Body Fat %: 70.9 % Fat Mass (lbs): 78 lbs Muscle Mass (lbs): 106.8 lbs Total Body Water (lbs): 77.6 lbs Visceral Fat Rating : 11   Other Clinical Data Fasting: no Labs: no Today's Visit #: 19 Starting Date: 09/08/22    Objective:   PHYSICAL EXAM: Blood pressure 117/76, pulse 70, temperature (!) 97.1 F (36.2 C), height 5' 7 (1.702 m), weight 190 lb (86.2 kg), SpO2 99%. Body mass index is 29.76 kg/m. General: she is overweight, cooperative and in no acute distress. PSYCH: Has normal mood, affect and thought process.   HEENT: EOMI, sclerae are anicteric. Lungs: Normal breathing effort, no conversational dyspnea. Extremities: Moves * 4 Neurologic: A and O * 3, good insight  DIAGNOSTIC DATA REVIEWED: BMET    Component Value Date/Time   NA 137 12/20/2023 1404   NA 139 03/17/2023 0936   K 3.8 12/20/2023 1404   CL 101 12/20/2023 1404   CO2 25 12/20/2023 1404   GLUCOSE 96 12/20/2023 1404   BUN 13 12/20/2023 1404   BUN 21  03/17/2023 0936   CREATININE 0.67 12/20/2023 1404   CALCIUM 9.5 12/20/2023 1404   GFRNONAA >60 12/20/2023 1404   Lab Results  Component Value Date   HGBA1C 5.6 02/15/2024   HGBA1C 5.5 09/08/2022   Lab Results  Component Value Date   INSULIN  11.3 07/25/2023   INSULIN  9.3 09/08/2022   Lab Results  Component Value Date   TSH 1.66 09/22/2023   CBC    Component Value Date/Time   WBC 6.4 12/21/2023 0451   RBC 4.10 12/21/2023 0451   HGB 11.4 (L) 12/21/2023 0451   HGB 13.5 09/08/2022 0854   HCT 35.3 (L) 12/21/2023 0451   HCT 43.1 09/08/2022 0854   PLT 171 12/21/2023 0451   PLT 219 09/08/2022 0854   MCV 86.1 12/21/2023 0451   MCV 84 09/08/2022 0854   MCH 27.8 12/21/2023 0451   MCHC 32.3 12/21/2023 0451   RDW 15.0 12/21/2023 0451   RDW 14.7 09/08/2022 0854   Iron Studies No results found for: IRON, TIBC, FERRITIN, IRONPCTSAT Lipid Panel     Component Value Date/Time   CHOL 256 (A) 02/15/2024 0000   CHOL 250 (H) 07/25/2023 1023   TRIG 68 02/15/2024 0000   HDL 97 (A) 02/15/2024 0000   HDL 82 07/25/2023 1023   CHOLHDL 3.0 07/25/2023 1023   LDLCALC 146 02/15/2024 0000   LDLCALC 151 (H) 07/25/2023 1023   Hepatic Function Panel     Component Value Date/Time   PROT 6.1 (L) 12/12/2023 0523   PROT 7.0 03/17/2023 0936   ALBUMIN 3.0 (L) 12/12/2023 0523   ALBUMIN 4.6 03/17/2023 0936   AST 30 12/12/2023 0523   ALT 36 12/12/2023 0523   ALKPHOS 65 12/12/2023 0523   BILITOT 0.6 12/12/2023 0523   BILITOT 0.3 03/17/2023 0936      Component Value Date/Time   TSH 1.66 09/22/2023 0807   Nutritional Lab Results  Component Value Date   VD25OH 24.5 (L) 07/25/2023   VD25OH 28.0 (L) 03/17/2023   VD25OH 20.9 (L) 09/08/2022    Attestations:   Andrea Bridges, acting as a stage manager for Andrea Jenkins, DO., have compiled all relevant documentation for today's office visit on behalf of Andrea Jenkins, DO, while in the presence of [employer], DO.  I have  reviewed the above documentation for accuracy and completeness, and I agree with the above.  Andrea Bridges, D.O.  The 21st Century Cures Act was signed into law in 2016 which includes the topic of electronic health records.  This provides immediate access to information in MyChart.  This includes consultation notes, operative notes, office notes, lab results and pathology reports.  If you have any questions about what you read please let us  know at your next visit so we can discuss your concerns and take corrective action if need be.  We are right here with you.           "

## 2024-05-29 ENCOUNTER — Other Ambulatory Visit (HOSPITAL_COMMUNITY): Payer: Self-pay

## 2024-05-30 ENCOUNTER — Other Ambulatory Visit (HOSPITAL_COMMUNITY): Payer: Self-pay

## 2024-05-31 ENCOUNTER — Encounter (HOSPITAL_COMMUNITY): Payer: Self-pay

## 2024-06-01 ENCOUNTER — Telehealth (HOSPITAL_COMMUNITY): Payer: Self-pay

## 2024-06-01 ENCOUNTER — Other Ambulatory Visit (HOSPITAL_COMMUNITY): Payer: Self-pay

## 2024-06-01 ENCOUNTER — Other Ambulatory Visit (HOSPITAL_BASED_OUTPATIENT_CLINIC_OR_DEPARTMENT_OTHER): Payer: Self-pay

## 2024-06-01 NOTE — Telephone Encounter (Signed)
 Copay is $85.10, no PA needed at this time.

## 2024-06-01 NOTE — Telephone Encounter (Signed)
 Where is this request coming from? Andrea Bridges  Medication: Zepbound  5mg  Prior authorization required? Yes If YES, on primary or secondary insurance? Primary Comments:

## 2024-06-06 ENCOUNTER — Other Ambulatory Visit (HOSPITAL_COMMUNITY): Payer: Self-pay

## 2024-06-25 ENCOUNTER — Ambulatory Visit (INDEPENDENT_AMBULATORY_CARE_PROVIDER_SITE_OTHER): Admitting: Family Medicine

## 2024-08-06 ENCOUNTER — Ambulatory Visit: Admitting: Physical Medicine and Rehabilitation

## 2024-09-24 ENCOUNTER — Encounter: Admitting: Family Medicine
